# Patient Record
Sex: Female | Born: 1940 | Race: White | Hispanic: No | Marital: Married | Smoking: Never smoker
Health system: Southern US, Community
[De-identification: ages and names within clinical notes are randomized; demographics above are authoritative.]

## PROBLEM LIST (undated history)

## (undated) DIAGNOSIS — M1990 Unspecified osteoarthritis, unspecified site: Secondary | ICD-10-CM

## (undated) DIAGNOSIS — J40 Bronchitis, not specified as acute or chronic: Secondary | ICD-10-CM

## (undated) DIAGNOSIS — J189 Pneumonia, unspecified organism: Secondary | ICD-10-CM

## (undated) DIAGNOSIS — C3491 Malignant neoplasm of unspecified part of right bronchus or lung: Secondary | ICD-10-CM

## (undated) DIAGNOSIS — Z1231 Encounter for screening mammogram for malignant neoplasm of breast: Secondary | ICD-10-CM

## (undated) DIAGNOSIS — Z20822 Contact with and (suspected) exposure to covid-19: Secondary | ICD-10-CM

## (undated) DIAGNOSIS — J449 Chronic obstructive pulmonary disease, unspecified: Secondary | ICD-10-CM

## (undated) DIAGNOSIS — K118 Other diseases of salivary glands: Secondary | ICD-10-CM

## (undated) DIAGNOSIS — Z01818 Encounter for other preprocedural examination: Secondary | ICD-10-CM

## (undated) DIAGNOSIS — Z1211 Encounter for screening for malignant neoplasm of colon: Secondary | ICD-10-CM

## (undated) DIAGNOSIS — I10 Essential (primary) hypertension: Secondary | ICD-10-CM

## (undated) DIAGNOSIS — R918 Other nonspecific abnormal finding of lung field: Secondary | ICD-10-CM

## (undated) DIAGNOSIS — Z95828 Presence of other vascular implants and grafts: Secondary | ICD-10-CM

## (undated) DIAGNOSIS — R1013 Epigastric pain: Secondary | ICD-10-CM

## (undated) DIAGNOSIS — J320 Chronic maxillary sinusitis: Secondary | ICD-10-CM

## (undated) DIAGNOSIS — T189XXA Foreign body of alimentary tract, part unspecified, initial encounter: Principal | ICD-10-CM

## (undated) DIAGNOSIS — R739 Hyperglycemia, unspecified: Secondary | ICD-10-CM

## (undated) DIAGNOSIS — E782 Mixed hyperlipidemia: Secondary | ICD-10-CM

## (undated) HISTORY — PX: CARPAL TUNNEL RELEASE: SHX101

## (undated) HISTORY — PX: BREAST LUMPECTOMY: SHX2

---

## 2012-08-23 LAB — METABOLIC PANEL, BASIC
Anion gap: 7 mmol/L (ref 7–16)
BUN: 12 MG/DL (ref 8–23)
CO2: 30 MMOL/L (ref 21–32)
Calcium: 9 MG/DL (ref 8.3–10.4)
Chloride: 102 MMOL/L (ref 98–107)
Creatinine: 0.8 MG/DL (ref 0.6–1.0)
GFR est AA: >60 mL/min/{1.73_m2} (ref >60)
GFR est non-AA: >60 mL/min/{1.73_m2} (ref >60)
Glucose: 102 MG/DL — ABNORMAL HIGH (ref 65–100)
Potassium: 4 MMOL/L (ref 3.5–5.1)
Sodium: 139 MMOL/L (ref 136–145)

## 2012-08-23 NOTE — Other (Signed)
Verified with patient: Patient's name, name of surgeon, procedure to be performed and date of procedure. All correct in system.    Pt declined offer to see anesthesia today.  Pt aware will meet MDA in preop DOS.    Labs done per anesthesia protocols / surgeon orders: BMP and EKG done today per surgeon's orders. EKG within anesthesia protocols; BMP result pending. No other labs needed per anesthesia protocols.     TYPE -  1B    surgery.    Patient given opportunity to ask questions related to instructions given during pre- assessment.  All questions answered. Informed pt to refer to printed materials provided and to call with any questions between now and the DOS. Phone # printed on materials given to patient.

## 2012-08-23 NOTE — Other (Signed)
EKG and BMP results within anesthesia guidelines. Faxed to surgeon's office for review. Chart filed.     Recent Results (from the past 12 hour(s))   METABOLIC PANEL, BASIC    Collection Time     08/23/12 12:37 PM       Result Value Range    Sodium 139  136 - 145 MMOL/L    Potassium 4.0  3.5 - 5.1 MMOL/L    Chloride 102  98 - 107 MMOL/L    CO2 30  21 - 32 MMOL/L    Anion gap 7  7 - 16 mmol/L    Glucose 102 (*) 65 - 100 MG/DL    BUN 12  8 - 23 MG/DL    Creatinine 5.40  0.6 - 1.0 MG/DL    GFR est AA >98  >11 ml/min/1.57m2    GFR est non-AA >60  >60 ml/min/1.92m2    Calcium 9.0  8.3 - 10.4 MG/DL

## 2012-08-24 LAB — EKG, 12 LEAD, INITIAL
Atrial Rate: 55 {beats}/min
Calculated P Axis: 75 degrees
Calculated R Axis: 38 degrees
Calculated T Axis: 73 degrees
P-R Interval: 1154 ms
Q-T Interval: 450 ms
QRS Duration: 92 ms

## 2012-08-24 NOTE — Progress Notes (Signed)
Spiritual Care review for pre-surgery/procedure consult.  David R. Gillespie, M.Div  Chaplain

## 2012-08-25 ENCOUNTER — Inpatient Hospital Stay: Payer: MEDICARE

## 2012-08-25 MED ADMIN — lactated ringers infusion: INTRAVENOUS | @ 16:00:00 | NDC 00409795309

## 2012-08-25 MED ADMIN — lidocaine (XYLOCAINE) 10 mg/mL (1 %) injection 0.1 mL: SUBCUTANEOUS | @ 16:00:00 | NDC 00409427601

## 2012-08-25 MED ADMIN — cefTRIAXone (ROCEPHIN) 1 g in 0.9% sodium chloride (MBP/ADV) 50 mL MBP: INTRAVENOUS | @ 18:00:00 | NDC 64679098302

## 2012-08-25 MED ADMIN — rocuronium (ZEMURON) injection: INTRAVENOUS | @ 18:00:00 | NDC 67457022810

## 2012-08-25 MED ADMIN — famotidine (PEPCID) tablet 20 mg: ORAL | @ 16:00:00 | NDC 68084017211

## 2012-08-25 MED ADMIN — ePHEDrine (MISTOLE) 50 mg/mL injection: INTRAVENOUS | @ 18:00:00 | NDC 11098051500

## 2012-08-25 MED ADMIN — oxymetazoline (AFRIN) 0.05 % nasal spray: NASAL | @ 18:00:00 | NDC 00904571135

## 2012-08-25 MED ADMIN — ondansetron (ZOFRAN) injection: INTRAVENOUS | @ 18:00:00 | NDC 00781301072

## 2012-08-25 MED ADMIN — propofol (DIPRIVAN) 10 mg/mL injection: INTRAVENOUS | @ 18:00:00 | NDC 00703285604

## 2012-08-25 MED ADMIN — lactated ringers infusion: INTRAVENOUS | @ 18:00:00 | NDC 00409795309

## 2012-08-25 MED ADMIN — lidocaine (PF) (XYLOCAINE) 20 mg/mL (2 %) injection: INTRAVENOUS | @ 18:00:00 | NDC 00409428202

## 2012-08-25 MED ADMIN — succinylcholine (ANECTINE) injection: INTRAVENOUS | @ 18:00:00 | NDC 00781300995

## 2012-08-25 MED ADMIN — fentaNYL citrate (PF) injection: INTRAVENOUS | @ 18:00:00 | NDC 10019003867

## 2012-08-25 MED ADMIN — lidocaine-EPINEPHrine (XYLOCAINE) 2 %-1:100,000 injection: SUBCUTANEOUS | @ 18:00:00 | NDC 00409318201

## 2012-08-25 NOTE — Op Note (Signed)
[facility]                           [address]                            [place], [place] [address]                                [phone]                                OPERATIVE REPORT  ________________________________________________________________________  NAME:  Audrey [name], Audrey [name]                            MR:  [id number]  LOC:  PACUPACUPL            SEX:  F               ACCT:  [id number]  DOB:  07/31/1940            AGE:  72              PT:  S  ADMIT:  08/25/2012          DSCH:                 MSV:  ________________________________________________________________________        DATE OF SURGERY: 08/25/2012    PREPROCEDURE DIAGNOSIS: Right maxillary sinusitis, chronic.    POSTPROCEDURE DIAGNOSIS: Right maxillary sinusitis, chronic.    NAME OF PROCEDURE: Right maxillary antrostomy with evacuation of  contents.    SURGEON: Gardiner Barefoot, MD    ANESTHESIA: General.    FINDINGS: Thick mucoid debris obstructing the right maxillary sinus. No  evidence of any other polyps.    BLOOD LOSS: Probably 15 mL.    PROCEDURE: With the patient under general endotracheal anesthesia, the  head and neck area were draped out. The external nose, septal areas and  the right inferior turbinate were all infiltrated with 2% Xylocaine with  epinephrine 1:100,000. The nose was also packed with Afrin-soaked  pledgets for decongestant action. Following suitable decongestion, an  inferior meatal puncture was made and slightly enlarged. Suction with a  collecting bottle was applied to the maxillary sinus and yielded a lot of  thick mucus and some bloody debris which was sent for culture. Repeated  irrigation cleared the sinus and the procedure was terminated. No packing  was placed in the nose.    The patient is to continue with antibiotic coverage and analgesic  medication. A followup visit has been scheduled for 1 week. The prognosis  seems good.                Gardiner Barefoot,  MD                This is an unverified document unless signed by physician.    TID:  [id number]                                      DT:  08/25/2012 01:44 P  JOB:  [id number]  DOC#:  [id number]           DD:  08/25/2012    cc:   Gardiner Barefoot, MD

## 2012-08-25 NOTE — Anesthesia Post-Procedure Evaluation (Signed)
Post-Anesthesia Evaluation and Assessment    Patient: Audrey [name] MRN: [id number]  SSN: [id number]    Date of Birth: [birth year]  Age: 72 y.o.  Sex: female       Cardiovascular Function/Vital Signs  Visit Vitals   Item Reading   ??? BP 150/67   ??? Pulse 77   ??? Temp 36.7 ??C (98.1 ??F)   ??? Resp 16   ??? SpO2 94%       Patient is status post General anesthesia for Procedure(s):  MAXILARY ANTROSTOMY RIGHT .    Nausea/Vomiting: None    Postoperative hydration reviewed and adequate.    Pain:  Pain Scale 1: Numeric (0 - 10) (08/25/12 1409)  Pain Intensity 1: 0 (08/25/12 1409)   Managed    Neurological Status:   Neuro (WDL): Exceptions to WDL (08/25/12 1409)  Neuro  Neurologic State: Drowsy (08/25/12 1409)   At baseline    Mental Status and Level of Consciousness: Alert and oriented     Pulmonary Status:   O2 Device: Room air (08/25/12 1409)   Adequate oxygenation and airway patent    Complications related to anesthesia: None    Post-anesthesia assessment completed. No concerns    Signed By: Derry Lory, MD     August 25, 2012

## 2012-08-25 NOTE — Progress Notes (Signed)
Pre-surgery consult. Patient already in surgery. Offered silent prayer for patient and medical team.  David R. Gillespie, M.Div  Chaplain  [facility]

## 2012-08-25 NOTE — Brief Op Note (Signed)
BRIEF OPERATIVE NOTE    Date of Procedure: 08/25/2012   Preoperative Diagnosis: SINUSITIS  Postoperative Diagnosis: SINUSITIS    Procedure(s):  MAXILARY ANTROSTOMY RIGHT   Surgeon(s) and Role:     * Gardiner Barefoot., MD - Primary  Anesthesia: General   Estimated Blood Loss: 15 ml    Specimens: Culture of right maxillary contents  Findings: Mucus and debris right maxillary sinus.     Complications: 0      Implants: * No implants in log *

## 2012-08-25 NOTE — Op Note (Signed)
[facility]                           [address]                            [place] [address]                                [phone]                                OPERATIVE REPORT  ________________________________________________________________________  NAME:  [name], Audrey J                            MR:  [id number]  LOC:  PACUPACUPL            SEX:  F               ACCT:  [id number]  DOB:  08/05/1940            AGE:  72              PT:  S  ADMIT:  08/25/2012          DSCH:                 MSV:  ________________________________________________________________________        DATE OF SURGERY: 08/25/2012    PREPROCEDURE DIAGNOSIS: Right maxillary sinusitis, chronic.    POSTPROCEDURE DIAGNOSIS: Right maxillary sinusitis, chronic.    NAME OF PROCEDURE: Right maxillary antrostomy with evacuation of  contents.    SURGEON: Jaretzi Droz G. Mahon Jr, MD    ANESTHESIA: General.    FINDINGS: Thick mucoid debris obstructing the right maxillary sinus. No  evidence of any other polyps.    BLOOD LOSS: Probably 15 mL.    PROCEDURE: With the patient under general endotracheal anesthesia, the  head and neck area were draped out. The external nose, septal areas and  the right inferior turbinate were all infiltrated with 2% Xylocaine with  epinephrine 1:100,000. The nose was also packed with Afrin-soaked  pledgets for decongestant action. Following suitable decongestion, an  inferior meatal puncture was made and slightly enlarged. Suction with a  collecting bottle was applied to the maxillary sinus and yielded a lot of  thick mucus and some bloody debris which was sent for culture. Repeated  irrigation cleared the sinus and the procedure was terminated. No packing  was placed in the nose.    The patient is to continue with antibiotic coverage and analgesic  medication. A followup visit has been scheduled for 1 week. The prognosis  seems good.                Jencarlo Bonadonna G Mahon, Jr,  MD                This is an unverified document unless signed by physician.    TID:  [id number]                                      DT:  08/25/2012 01:44 P  JOB:  [id number]             DOC#:  [id number]           DD:  08/25/2012    cc:   Antiono Ettinger G Mahon, Jr, MD

## 2012-08-25 NOTE — Anesthesia Pre-Procedure Evaluation (Addendum)
Anesthetic History              Review of Systems / Medical History  Patient summary reviewed and pertinent labs reviewed    Pulmonary          Smoker       Neuro/Psych              Cardiovascular                Exercise tolerance: >4 METS     GI/Hepatic/Renal                  Endo/Other        Arthritis     Other Findings            Physical Exam    Airway  Mallampati: II  TM Distance: 4 - 6 cm  Neck ROM: normal range of motion   Mouth opening: Normal     Cardiovascular  Regular rate and rhythm,  S1 and S2 normal,  no murmur, click, rub, or gallop             Dental         Pulmonary  Breath sounds clear to auscultation               Abdominal         Other Findings            Anesthetic Plan    ASA: 2  Anesthesia type: general          Induction: Intravenous  Anesthetic plan and risks discussed with: Patient and Family

## 2012-08-28 LAB — CULTURE SINUS

## 2012-08-30 LAB — CULTURE, ANAEROBIC: Culture result:: NO GROWTH

## 2013-07-14 ENCOUNTER — Encounter

## 2013-07-19 ENCOUNTER — Encounter

## 2013-07-19 MED ORDER — SODIUM BICARBONATE 4 % IV
4 % | Freq: Once | INTRAVENOUS | Status: AC
Start: 2013-07-19 — End: 2013-07-19
  Administered 2013-07-19: 20:00:00 via SUBCUTANEOUS

## 2013-07-19 MED ORDER — LIDOCAINE HCL 2 % (20 MG/ML) IJ SOLN
20 mg/mL (2 %) | Freq: Once | INTRAMUSCULAR | Status: AC
Start: 2013-07-19 — End: 2013-07-19
  Administered 2013-07-19: 20:00:00 via SUBCUTANEOUS

## 2013-07-19 MED FILL — NEUT 4 % INTRAVENOUS SOLUTION: 4 % | INTRAVENOUS | Qty: 1

## 2013-07-19 MED FILL — XYLOCAINE 20 MG/ML (2 %) INJECTION SOLUTION: 20 mg/mL (2 %) | INTRAMUSCULAR | Qty: 20

## 2013-07-19 NOTE — Procedures (Signed)
Interventional Radiology Brief Procedure Note    Patient: Audrey Snow MRN: [id number]  SSN: [id number]    Date of Birth: 1940/10/23  Age: 73 y.o.  Sex: female      Date of Procedure: 07/19/2013    Procedure(s): L Parotid Gland mass FNA biopsy    Performed By: Vladimir Creeks, MD     Anesthesia: None    Estimated Blood Loss: None    Specimens: Cytopathology    Findings: Ovoid, hypoechoic mass in the left parotid gland.     Complications: None    Implants: None    Plan: Discharge home.       Follow Up: Dr Mickle Mallory    Signed By: Vladimir Creeks, MD     July 19, 2013

## 2013-07-19 NOTE — Progress Notes (Signed)
Patient tolerated procedure well, discharge instructions given, assisted patient to front exit where family awaits.

## 2013-07-19 NOTE — Procedures (Signed)
Interventional Radiology Brief Procedure Note    Patient: Audrey Snow MRN: [id number]  SSN: [id number]    Date of Birth: June 27, 1941  Age: 73 y.o.  Sex: female      Date of Procedure: 07/19/2013    Procedure(s): L Parotid Gland mass FNA biopsy    Performed By: Vladimir Creeks, MD     Anesthesia: None    Estimated Blood Loss: None    Specimens: Cytopathology    Findings: Ovoid, hypoechoic mass in the left parotid gland.     Complications: None    Implants: None    Plan: Discharge home.       Follow Up: Dr Mickle Mallory    Signed By: Vladimir Creeks, MD     July 19, 2013

## 2014-01-30 MED ORDER — MUPIROCIN 2 % OINTMENT
2 % | CUTANEOUS | Status: DC
Start: 2014-01-30 — End: 2014-10-05

## 2014-01-30 MED ORDER — BACITRACIN-POLYMYXIN-NEOSP HC 1 % OINTMENT
1 % | CUTANEOUS | Status: DC
Start: 2014-01-30 — End: 2016-02-12

## 2014-01-30 MED ORDER — CYCLOBENZAPRINE 10 MG TAB
10 mg | ORAL_TABLET | ORAL | Status: DC
Start: 2014-01-30 — End: 2016-02-12

## 2014-01-30 MED ORDER — NEOMYCIN-POLYMYXIN-HC 3.5 MG-10,000 UNIT/ML-1 % EAR DROPS, SUSP
3.5-10000-1 mg/mL-unit/mL-% | Freq: Three times a day (TID) | OTIC | Status: DC
Start: 2014-01-30 — End: 2014-11-22

## 2014-01-30 NOTE — Progress Notes (Signed)
HISTORY OF PRESENT ILLNESS  Audrey Snow is a 73 y.o. female.  HPI  Presents for HM issue update- needs RF on meds, labs and Mammogram updated  Still seeing Dr. Debbra Riding on regular basis for her RA   Review of Systems   Constitutional:        Weight gain   HENT: Positive for ear discharge and hearing loss.    Gastrointestinal: Positive for heartburn.       Physical Exam   Constitutional: She is oriented to person, place, and time. She appears well-developed and well-nourished. No distress.   HENT:   Head: Normocephalic and atraumatic.   Right Ear: External ear normal.   Left Ear: External ear normal.   Mouth/Throat: No oropharyngeal exudate.   Right TM  Has perforation from previous tube placement and moist drainage noted in canal  Left TM negative  Pt very HOH   Eyes: Conjunctivae and EOM are normal. Pupils are equal, round, and reactive to light.   Neck: Normal range of motion. Neck supple. Carotid bruit is not present. No thyromegaly present.   Cardiovascular: Normal rate, regular rhythm, normal heart sounds and intact distal pulses.    Pulmonary/Chest: Effort normal and breath sounds normal. Right breast exhibits no inverted nipple, no mass and no tenderness. Left breast exhibits no inverted nipple, no mass and no tenderness. Breasts are symmetrical.   Abdominal: Soft. Bowel sounds are normal. She exhibits no mass. There is no tenderness.   Genitourinary: Vagina normal.   No adnexal masses  No adenopathy in groin   Musculoskeletal: Normal range of motion. She exhibits no edema.   Lymphadenopathy:     She has no cervical adenopathy.   Neurological: She is alert and oriented to person, place, and time. She has normal reflexes.   Skin: Skin is warm and dry.   Psychiatric: She has a normal mood and affect. Her behavior is normal. Judgment and thought content normal.       ASSESSMENT and PLAN    ICD-9-CM    1. Rheumatoid arthritis(714.0) 714.0 CBC W/O DIFF      METABOLIC PANEL, COMPREHENSIVE- update labs on present meds and continue to follow with Dr. Debbra Riding   2. Unspecified hypothyroidism 244.9 TSH, 3RD GENERATION- update labs on Levothyroxine   3. Unspecified vitamin D deficiency 268.9 Monitor   4. Osteoporosis, unspecified 733.00    5. 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 Gastroesophageal reflux disease without esophagitis 530.81 Continue Omeprazole daily   7. Otitis externa in other diseases classified elsewhere, right ear 380.13 RX for ear gtts   8. Visit for screening mammogram V76.12 MAM MAMMO BI SCREENING DIGTL   HOH- needs to go and get her hearing aides and see ENT in near future  HTN- BP stable on Losartan

## 2014-01-31 LAB — CBC W/O DIFF
HCT: 41.9 % (ref 34.0–46.6)
HGB: 14.2 g/dL (ref 11.1–15.9)
MCH: 32.1 pg (ref 26.6–33.0)
MCHC: 33.9 g/dL (ref 31.5–35.7)
MCV: 95 fL (ref 79–97)
PLATELET: 245 10*3/uL (ref 150–379)
RBC: 4.43 x10E6/uL (ref 3.77–5.28)
RDW: 13.7 % (ref 12.3–15.4)
WBC: 5.4 10*3/uL (ref 3.4–10.8)

## 2014-01-31 LAB — METABOLIC PANEL, COMPREHENSIVE
A-G Ratio: 1.8 (ref 1.1–2.5)
ALT (SGPT): 21 IU/L (ref 0–32)
AST (SGOT): 19 IU/L (ref 0–40)
Albumin: 4.4 g/dL (ref 3.5–4.8)
Alk. phosphatase: 42 IU/L (ref 39–117)
BUN/Creatinine ratio: 18 (ref 11–26)
BUN: 13 mg/dL (ref 8–27)
Bilirubin, total: 0.8 mg/dL (ref 0.0–1.2)
CO2: 25 mmol/L (ref 18–29)
Calcium: 9.8 mg/dL (ref 8.7–10.3)
Chloride: 99 mmol/L (ref 97–108)
Creatinine: 0.73 mg/dL (ref 0.57–1.00)
GFR est AA: 94 mL/min/{1.73_m2} (ref >59)
GFR est non-AA: 81 mL/min/{1.73_m2} (ref >59)
GLOBULIN, TOTAL: 2.5 g/dL (ref 1.5–4.5)
Glucose: 108 mg/dL — ABNORMAL HIGH (ref 65–99)
Potassium: 4.3 mmol/L (ref 3.5–5.2)
Protein, total: 6.9 g/dL (ref 6.0–8.5)
Sodium: 140 mmol/L (ref 134–144)

## 2014-01-31 LAB — TSH 3RD GENERATION: TSH: 0.082 u[IU]/mL — ABNORMAL LOW (ref 0.450–4.500)

## 2014-01-31 NOTE — Progress Notes (Signed)
Quick Note:        Labs are stable. Blood sugar is mildly elevated so watch sweets in diet and her thyroid dose is Ok- cannot increase the dose so    Watch her diet    ______

## 2014-02-01 NOTE — Progress Notes (Signed)
Quick Note:        Pt notified of lab results.    ______

## 2014-02-01 NOTE — Progress Notes (Signed)
Quick Note:        Unable to reach pt Left message for pt to call back    ______

## 2014-02-14 NOTE — Progress Notes (Signed)
Quick Note:        Mammogram negative- GC sent    ______

## 2014-03-28 MED ORDER — LEVOTHYROXINE 125 MCG TAB
125 mcg | ORAL_TABLET | ORAL | Status: DC
Start: 2014-03-28 — End: 2015-03-30

## 2014-05-31 MED ORDER — FLUTICASONE 50 MCG/ACTUATION NASAL SPRAY, SUSP
50 mcg/actuation | Freq: Every day | NASAL | Status: DC
Start: 2014-05-31 — End: 2014-10-05

## 2014-10-05 ENCOUNTER — Ambulatory Visit: Admit: 2014-10-05 | Discharge: 2014-10-05 | Payer: MEDICARE | Attending: Internal Medicine | Primary: Internal Medicine

## 2014-10-05 DIAGNOSIS — M069 Rheumatoid arthritis, unspecified: Secondary | ICD-10-CM

## 2014-10-05 LAB — AMB POC URINALYSIS DIP STICK AUTO W/O MICRO
Glucose (UA POC): NEGATIVE
Leukocyte esterase (UA POC): NEGATIVE
Nitrites (UA POC): NEGATIVE

## 2014-10-05 MED ORDER — FLUTICASONE 50 MCG/ACTUATION NASAL SPRAY, SUSP
50 mcg/actuation | Freq: Every day | NASAL | Status: DC
Start: 2014-10-05 — End: 2016-02-12

## 2014-10-05 NOTE — Progress Notes (Signed)
HISTORY OF PRESENT ILLNESS  Audrey Snow is a 74 y.o. female.  HPI  Has been seeing Dr. Debbra Riding regularly but has not been here in our office for several mos  Today having multiple complaints to include HA, sinus and head congestion   Has been having back pain and midepigastric pain and not feeling well    Patient Active Problem List   Diagnosis Code   ??? Rheumatoid arthritis ([place]) M06.9   ??? Hypothyroidism E03.9   ??? Vitamin D deficiency E55.9   ??? Osteoporosis, unspecified M81.0   ??? Chronic airway obstruction, not elsewhere classified ([place]) J44.9   ??? Gastroesophageal reflux disease without esophagitis K21.9   ??? HTN (hypertension), benign I10   ??? Environmental allergies Z91.09     Past Medical History   Diagnosis Date   ??? Hearing loss of both ears    ??? Rheumatoid arthritis(714.0) ([place])      Dr Debbra Riding follows, on methotrexate for control   ??? Osteoarthritis    ??? Sinusitis    ??? Hypothyroid      on med for control   ??? Vitamin D deficiency    ??? Osteoporosis    ??? Chronic obstructive pulmonary disease ([place])    ??? GERD (gastroesophageal reflux disease)    ??? Rheumatoid arthritis(714.0) ([place]) 01/30/2014   ??? Unspecified hypothyroidism 01/30/2014   ??? HTN (hypertension), benign 01/30/2014     No Known Allergies  Current Outpatient Prescriptions   Medication Sig Dispense Refill   ??? fluticasone (FLONASE) 50 mcg/actuation nasal spray 2 Sprays by Both Nostrils route daily. 1 Bottle 5   ??? levothyroxine (SYNTHROID) 125 mcg tablet Take one tablet by mouth everyday. 30 Tab 11   ??? Cholecalciferol, Vitamin D3, 50,000 unit cap Take 1 Cap by mouth every Monday.     ??? methotrexate, PF, 25 mg/mL injection 25 mg every seven (7) days.     ??? losartan (COZAAR) 50 mg tablet Take  by mouth daily.     ??? nicotinic acid (NIACIN) 500 mg tablet Take 500 mg by mouth Daily (before breakfast). 2 po everyday.     ??? calcium-cholecalciferol, d3, (CALCIUM 600 + D) 600-125 mg-unit tab Take 2 Tabs by mouth.      ??? multivitamin (ONE A DAY) tablet Take 1 Tab by mouth daily.     ??? neomycin-polymyxin-hydrocortisone, buffered, (PEDIOTIC) 3.5-10,000-1 mg/mL-unit/mL-% otic suspension Administer 3 Drops in right ear three (3) times daily. 10 mL 0   ??? cyclobenzaprine (FLEXERIL) 10 mg tablet Take one tablet every evening when needed. 30 Tab 5   ??? bacitracin-neomycin-polymyxin b-hydrocortisone (CORTISPORIN) 1 % ointment Use samll amount 3-4 times a day. 15 g 5   ??? folic acid (FOLVITE) 1 mg tablet Take  by mouth daily.         Review of Systems   Constitutional: Positive for malaise/fatigue.   HENT: Positive for congestion, hearing loss and sore throat.    Respiratory: Positive for cough and shortness of breath.    Cardiovascular: Negative for chest pain.   Musculoskeletal: Positive for joint pain.   All other systems reviewed and are negative.    BP 150/70 mmHg   Pulse 67   Ht 5\' 7"  (1.702 m)   Wt 169 lb 3.2 oz (76.749 kg)   BMI 26.49 kg/m2   SpO2 94%    Physical Exam   Constitutional: She is oriented to person, place, and time. She appears well-developed and well-nourished. No distress.   HENT:   Head: Normocephalic  and atraumatic.   Right Ear: Decreased hearing is noted.   Left Ear: Decreased hearing is noted.   Cardiovascular: Normal rate, regular rhythm and normal heart sounds.    Pulmonary/Chest: Effort normal.   Rhonchi bilateral   Abdominal: Soft. Bowel sounds are normal. There is tenderness.   midepigastric mild tenderness   Neurological: She is alert and oriented to person, place, and time.   Skin: Skin is dry.   Psychiatric: She has a normal mood and affect. Her behavior is normal. Judgment and thought content normal.       ASSESSMENT and PLAN    ICD-10-CM ICD-9-CM    1. Rheumatoid arthritis, involving unspecified site, unspecified rheumatoid factor presence ([facility]) M06.9 714.0 CBC W/O DIFF      METABOLIC PANEL, COMPREHENSIVE- follow with Dr. Debbra Riding   2. Acquired hypothyroidism E03.9 244.9 TSH, 3RD GENERATION- need to update  labs on present med dose   3. Vitamin D deficiency E55.9 268.9 Requires supplements   4. Gastroesophageal reflux disease without esophagitis K21.9 530.81    5. HTN (hypertension), benign I10 401.1 CBC W/O DIFF      METABOLIC PANEL, COMPREHENSIVE- update renal function- on ARB   6. Environmental allergies Z91.09 V15.09 Continue Flonase   7. Low back pain without sciatica, unspecified back pain laterality M54.5 724.2 Chronic    8. Urinary frequency R35.0 788.41 AMB POC URINALYSIS DIP STICK AUTO W/O MICRO   9. SOB (shortness of breath) R06.02 786.05 XR CHEST PA LAT- update with history of tobacco use   10. Elevated blood sugar R73.9 790.29 HEMOGLOBIN A1C- update   11. Midepigastric pain R10.13 789.06 XR UPPER GI SERIES W KUB- will check xrays for GERD or gastritis

## 2014-10-05 NOTE — Progress Notes (Signed)
Quick Note:        negative    ______

## 2014-10-06 LAB — METABOLIC PANEL, COMPREHENSIVE
A-G Ratio: 1.8 (ref 1.1–2.5)
ALT (SGPT): 16 IU/L (ref 0–32)
AST (SGOT): 14 IU/L (ref 0–40)
Albumin: 4.4 g/dL (ref 3.5–4.8)
Alk. phosphatase: 52 IU/L (ref 39–117)
BUN/Creatinine ratio: 19 (ref 11–26)
BUN: 14 mg/dL (ref 8–27)
Bilirubin, total: 0.5 mg/dL (ref 0.0–1.2)
CO2: 28 mmol/L (ref 18–29)
Calcium: 9.6 mg/dL (ref 8.7–10.3)
Chloride: 101 mmol/L (ref 97–108)
Creatinine: 0.73 mg/dL (ref 0.57–1.00)
GFR est AA: 93 mL/min/{1.73_m2} (ref >59)
GFR est non-AA: 81 mL/min/{1.73_m2} (ref >59)
GLOBULIN, TOTAL: 2.5 g/dL (ref 1.5–4.5)
Glucose: 91 mg/dL (ref 65–99)
Potassium: 4.3 mmol/L (ref 3.5–5.2)
Protein, total: 6.9 g/dL (ref 6.0–8.5)
Sodium: 141 mmol/L (ref 134–144)

## 2014-10-06 LAB — CBC W/O DIFF
HCT: 43.8 % (ref 34.0–46.6)
HGB: 14.4 g/dL (ref 11.1–15.9)
MCH: 32 pg (ref 26.6–33.0)
MCHC: 32.9 g/dL (ref 31.5–35.7)
MCV: 97 fL (ref 79–97)
PLATELET: 232 10*3/uL (ref 150–379)
RBC: 4.5 x10E6/uL (ref 3.77–5.28)
RDW: 13.6 % (ref 12.3–15.4)
WBC: 7.7 10*3/uL (ref 3.4–10.8)

## 2014-10-06 LAB — HEMOGLOBIN A1C WITH EAG: Hemoglobin A1c: 6.3 % — ABNORMAL HIGH (ref 4.8–5.6)

## 2014-10-06 LAB — TSH 3RD GENERATION: TSH: 0.094 u[IU]/mL — ABNORMAL LOW (ref 0.450–4.500)

## 2014-10-06 NOTE — Progress Notes (Signed)
Quick Note:        Labs are stable    No change in thyroid medication- needs to watch her sugar intake as Blood sugar mildly elevated    Copy of labs to Dr. Debbra Riding and to pt    ______

## 2014-10-09 NOTE — Progress Notes (Signed)
Quick Note:        Faxed to Dr Debbra Riding at [phone].    ______

## 2014-10-18 ENCOUNTER — Inpatient Hospital Stay: Admit: 2014-10-18 | Payer: MEDICARE | Attending: Internal Medicine | Primary: Internal Medicine

## 2014-10-18 ENCOUNTER — Encounter

## 2014-10-18 DIAGNOSIS — R1013 Epigastric pain: Secondary | ICD-10-CM

## 2014-10-18 MED ORDER — SOD BICARB-CITRIC AC-SIMETH 2.21 GRAM-1.53 GRAM/4 GRAM ORAL GRAN IN PK
Freq: Once | ORAL | Status: AC
Start: 2014-10-18 — End: 2014-10-18
  Administered 2014-10-18: 14:00:00 via ORAL

## 2014-10-18 MED ORDER — BARIUM SULFATE 98 % ORAL SUSP, RECON
98 % | Freq: Once | ORAL | Status: AC
Start: 2014-10-18 — End: 2014-10-18
  Administered 2014-10-18: 14:00:00 via ORAL

## 2014-10-18 MED ORDER — BARIUM SULFATE 60 % (W/V) ORAL SUSP
60 % (w/v) | Freq: Once | ORAL | Status: AC
Start: 2014-10-18 — End: 2014-10-18
  Administered 2014-10-18: 15:00:00 via ORAL

## 2014-10-18 NOTE — Progress Notes (Signed)
Quick Note:        Has a HH and reflux that is spontaneous and was noted on exam    No ulcer    Will need to be on daily medication    ______

## 2014-10-18 NOTE — Progress Notes (Signed)
Quick Note:        CXR ok    ______

## 2014-10-18 NOTE — Progress Notes (Signed)
Quick Note:        Start daily Zantac 150mg  and send in RX    ______

## 2014-10-19 MED ORDER — RANITIDINE 150 MG TAB
150 mg | ORAL_TABLET | Freq: Every day | ORAL | Status: DC
Start: 2014-10-19 — End: 2016-02-12

## 2014-10-19 NOTE — Progress Notes (Signed)
Quick Note:        Pt notified of CXR results.    ______

## 2014-10-19 NOTE — Progress Notes (Signed)
Quick Note:        Unable to reach pt Left message for pt to call back.    ______

## 2014-10-19 NOTE — Progress Notes (Signed)
Quick Note:        Pt notified of UGI and barium swallow results. Rx for Zantac 150mg  sent to [facility]. [phone])    ______

## 2014-11-22 ENCOUNTER — Ambulatory Visit: Admit: 2014-11-22 | Discharge: 2014-11-22 | Payer: MEDICARE | Attending: Family | Primary: Internal Medicine

## 2014-11-22 DIAGNOSIS — J329 Chronic sinusitis, unspecified: Secondary | ICD-10-CM

## 2014-11-22 MED ORDER — CEFUROXIME AXETIL 250 MG TAB
250 mg | ORAL_TABLET | Freq: Two times a day (BID) | ORAL | Status: DC
Start: 2014-11-22 — End: 2016-02-12

## 2014-11-22 NOTE — Progress Notes (Signed)
Willaim Bane presents today c/o dizziness.  She has been seeing Dr. Mickle Mallory (ENT) - was treated for a right OM with antibiotic x 14 days.  About 10 days later, she started getting head/sinus pressure.  She is tired, eyes congested, vertigo and all symptoms worse with position changes.  Lastly, she has had mid-epigastric pain and was recently diagnosed with a hiatal hernia and GERD; she has started on Zantac daily.    Chief Complaint   Patient presents with   ??? Dizziness     Patient Active Problem List   Diagnosis Code   ??? Rheumatoid arthritis ([place]) M06.9   ??? Hypothyroidism E03.9   ??? Vitamin D deficiency E55.9   ??? Osteoporosis, unspecified M81.0   ??? Chronic airway obstruction, not elsewhere classified ([place]) J44.9   ??? Gastroesophageal reflux disease without esophagitis K21.9   ??? HTN (hypertension), benign I10   ??? Environmental allergies Z91.09     Past Medical History   Diagnosis Date   ??? Hearing loss of both ears    ??? Rheumatoid arthritis(714.0) ([place])      Dr Debbra Riding follows, on methotrexate for control   ??? Osteoarthritis    ??? Sinusitis    ??? Hypothyroid      on med for control   ??? Vitamin D deficiency    ??? Osteoporosis    ??? Chronic obstructive pulmonary disease ([place])    ??? GERD (gastroesophageal reflux disease)    ??? Rheumatoid arthritis(714.0) ([place]) 01/30/2014   ??? Unspecified hypothyroidism 01/30/2014   ??? HTN (hypertension), benign 01/30/2014     Past Surgical History   Procedure Laterality Date   ??? Hx carpal tunnel release Bilateral    ??? Hx hemorrhoidectomy     ??? 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 Hx colonoscopy       colon polyps   ??? Hx tonsillectomy  74 yrs old   ??? Hx other surgical  09/2012     sinus surgery-Dr. Mickle Mallory   ??? 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 Marital Status: MARRIED     Social History Main Topics   ??? Smoking status: Current Every Day Smoker -- 0.50 packs/day for 43 years   ??? Smokeless tobacco: Never Used       Comment: not regular when first started- 1pk/week   ??? Alcohol Use: No   ??? Drug Use: No     No Known Allergies    Current Outpatient Prescriptions   Medication Sig   ??? ranitidine (ZANTAC) 150 mg tablet Take 1 Tab by mouth daily.   ??? fluticasone (FLONASE) 50 mcg/actuation nasal spray 2 Sprays by Both Nostrils route daily.   ??? levothyroxine (SYNTHROID) 125 mcg tablet Take one tablet by mouth everyday.   ??? Cholecalciferol, Vitamin D3, 50,000 unit cap Take 1 Cap by mouth every Monday.   ??? methotrexate, PF, 25 mg/mL injection 25 mg every seven (7) days.   ??? losartan (COZAAR) 50 mg tablet Take  by mouth daily.   ??? nicotinic acid (NIACIN) 500 mg tablet Take 500 mg by mouth Daily (before breakfast). 2 po everyday.   ??? calcium-cholecalciferol, d3, (CALCIUM 600 + D) 600-125 mg-unit tab Take 2 Tabs by mouth.   ??? multivitamin (ONE A DAY) tablet Take 1 Tab by mouth daily.   ??? cyclobenzaprine (FLEXERIL) 10 mg tablet Take  one tablet every evening when needed.   ??? bacitracin-neomycin-polymyxin b-hydrocortisone (CORTISPORIN) 1 % ointment Use samll amount 3-4 times a day.   ??? folic acid (FOLVITE) 1 mg tablet Take  by mouth daily.     Review of Systems - Negative except as stated in HPI  History obtained from chart review and the patient  General ROS: negative for - fatigue or fever  Ophthalmic ROS: positive for - uses contacts and right eye crusted  ENT ROS: positive for - nasal congestion, nasal discharge, sinus pain and vertigo  negative for - sore throat  Respiratory ROS: no cough, shortness of breath, or wheezing  Cardiovascular ROS: no chest pain or dyspnea on exertion    BP 130/82 mmHg   Ht 5' 6.5" (1.689 m)   Wt 168 lb 9.6 oz (76.476 kg)   BMI 26.81 kg/m2    Physical Examination: General appearance - alert, well appearing, and in no distress, oriented to person, place, and time and normal appearing weight  Mental status - alert, oriented to person, place, and time, normal mood,  behavior, speech, dress, motor activity, and thought processes, affect appropriate to mood  Eyes - pupils equal and reactive, extraocular eye movements intact, right eye normal  Ears - Right TM with well-healed perforation; no drainage. Left TM dull/intact.  Nose - normal and patent, no erythema, discharge or polyps  Mouth - erythematous and exudate noted  Neck - supple, no significant adenopathy, thyroid exam: thyroid is normal in size without nodules or tenderness  Chest - clear to auscultation, no wheezes, rales or rhonchi, symmetric air entry  Heart - normal rate, regular rhythm, normal S1, S2, no murmurs, rubs, clicks or gallops    Assessment/Plan:  1. Chronic sinusitis, unspecified location    2. Gastroesophageal reflux disease with esophagitis    3. Hiatal hernia      Orders Placed This Encounter   ??? REFERRAL TO GASTROENTEROLOGY     Referral Priority:  Routine     Referral Type:  Consultation     Referral Reason:  Specialty Services Required   ??? cefUROXime (CEFTIN) 250 mg tablet     Sig: Take 1 Tab by mouth two (2) times a day.     Dispense:  20 Tab     Refill:  0     Ceftin as prescribed - directions for use and side effects discussed.  Recommended Mucinex D every morning.  Follow up with ENT as scheduled.  Refer to GI dx: GERD and hiatal hernia.    Follow-up Disposition:  Return if symptoms worsen or fail to improve.    Jeoffrey Massed, NP

## 2015-02-23 ENCOUNTER — Institutional Professional Consult (permissible substitution): Admit: 2015-02-23 | Discharge: 2015-02-23 | Payer: MEDICARE | Primary: Internal Medicine

## 2015-02-23 DIAGNOSIS — R350 Frequency of micturition: Secondary | ICD-10-CM

## 2015-02-23 LAB — AMB POC URINALYSIS DIP STICK AUTO W/O MICRO
Glucose (UA POC): NEGATIVE
Nitrites (UA POC): NEGATIVE

## 2015-02-23 NOTE — Progress Notes (Signed)
Send for C+S

## 2015-02-23 NOTE — Progress Notes (Signed)
Pt came in for a nursing visit for urine check. C/O urinary frequency and pain with urination. Urine dip done in lab, nitrates negative and trace leukocytes. Urine sent for culture. Pt notified I will call her with the results.

## 2015-02-25 LAB — CULTURE, URINE

## 2015-02-26 NOTE — Progress Notes (Signed)
Unable to reach pt, as she is working. Spouse notified and he will give her the results.

## 2015-02-26 NOTE — Progress Notes (Signed)
Urine negative- no infection

## 2015-03-30 MED ORDER — LEVOTHYROXINE 125 MCG TAB
125 mcg | ORAL_TABLET | ORAL | 11 refills | Status: DC
Start: 2015-03-30 — End: 2016-02-13

## 2015-04-02 LAB — HM DEXA SCAN

## 2016-02-12 ENCOUNTER — Ambulatory Visit: Admit: 2016-02-12 | Discharge: 2016-02-12 | Payer: MEDICARE | Attending: Internal Medicine | Primary: Internal Medicine

## 2016-02-12 DIAGNOSIS — I10 Essential (primary) hypertension: Secondary | ICD-10-CM

## 2016-02-12 LAB — AMB POC URINALYSIS DIP STICK AUTO W/O MICRO
Bilirubin (UA POC): NEGATIVE
Blood (UA POC): NEGATIVE
Glucose (UA POC): NEGATIVE
Ketones (UA POC): NEGATIVE
Nitrites (UA POC): NEGATIVE
Protein (UA POC): NEGATIVE mg/dL
Specific gravity (UA POC): 1.02 (ref 1.001–1.035)
Urobilinogen (UA POC): 0.2 (ref 0.2–1)
pH (UA POC): 7 (ref 4.6–8.0)

## 2016-02-12 MED ORDER — CYCLOBENZAPRINE 10 MG TAB
10 mg | ORAL_TABLET | ORAL | 5 refills | Status: DC
Start: 2016-02-12 — End: 2017-03-13

## 2016-02-12 MED ORDER — MUPIROCIN 2 % OINTMENT
2 % | Freq: Every day | CUTANEOUS | 5 refills | Status: DC
Start: 2016-02-12 — End: 2018-01-27

## 2016-02-12 MED ORDER — LOSARTAN 100 MG TAB
100 mg | ORAL_TABLET | Freq: Every day | ORAL | 11 refills | Status: DC
Start: 2016-02-12 — End: 2017-02-19

## 2016-02-12 MED ORDER — FLUTICASONE 50 MCG/ACTUATION NASAL SPRAY, SUSP
50 mcg/actuation | Freq: Every day | NASAL | 5 refills | Status: DC
Start: 2016-02-12 — End: 2017-02-06

## 2016-02-12 MED ORDER — AMOXICILLIN CLAVULANATE 875 MG-125 MG TAB
875-125 mg | ORAL_TABLET | Freq: Two times a day (BID) | ORAL | 0 refills | Status: DC
Start: 2016-02-12 — End: 2016-06-10

## 2016-02-12 MED ORDER — BACITRACIN-POLYMYXIN-NEOSP HC 1 % OINTMENT
1 % | CUTANEOUS | 5 refills | Status: DC
Start: 2016-02-12 — End: 2018-06-25

## 2016-02-12 NOTE — Progress Notes (Signed)
HPI:     Has not been seen in about one year  Has been mainly seeing Dr. Heath Gold and has had multiple tests ordered for her SOB and emphysema  Quit smoking about 4 yrs ago but still with symptoms    Recently with sinus congestion and drainage and cough with congestion  Txed recently for sinus infection with Bactrim DS earlier this month    Also needs refills on meds    Has been having urinary frequency  Problem List:  Patient Active Problem List   Diagnosis Code   ??? Rheumatoid arthritis ([place]) M06.9   ??? Hypothyroidism E03.9   ??? Vitamin D deficiency E55.9   ??? Osteoporosis, unspecified M81.0   ??? Gastroesophageal reflux disease without esophagitis K21.9   ??? HTN (hypertension), benign I10   ??? Environmental allergies Z91.09   ??? 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 Chronic obstructive pulmonary disease ([place])    ??? GERD (gastroesophageal reflux disease)    ??? Hearing loss of both ears    ??? HTN (hypertension), benign 01/30/2014   ??? Hypothyroid     on med for control   ??? Osteoarthritis    ??? Osteoporosis    ??? Rheumatoid arthritis(714.0) ([place])     Dr Debbra Riding follows, on methotrexate for control   ??? Rheumatoid arthritis(714.0) ([place]) 01/30/2014   ??? Sinusitis    ??? Unspecified hypothyroidism 01/30/2014   ??? Vitamin D deficiency        Allergies:  No Known Allergies    Current Medications:  Current Outpatient Prescriptions   Medication Sig Dispense Refill   ??? fluticasone (FLONASE) 50 mcg/actuation nasal spray 2 Sprays by Both Nostrils route daily. 1 Bottle 5   ??? cyclobenzaprine (FLEXERIL) 10 mg tablet Take one tablet every evening when needed. 20 Tab 5   ??? bacitracin-neomycin-polymyxin b-hydrocortisone (CORTISPORIN) 1 % ointment Use samll amount 3-4 times a day. 15 g 5   ??? amoxicillin-clavulanate (AUGMENTIN) 875-125 mg per tablet Take 1 Tab by mouth every twelve (12) hours. Indications: ACUTE BACTERIAL SINUSITIS 20 Tab 0    ??? losartan (COZAAR) 100 mg tablet Take 1 Tab by mouth daily. Indications: hypertension 30 Tab 11   ??? levothyroxine (SYNTHROID) 125 mcg tablet Take one tablet by mouth everyday. 30 Tab 11   ??? Cholecalciferol, Vitamin D3, 50,000 unit cap Take 1 Cap by mouth every Monday.     ??? methotrexate, PF, 25 mg/mL injection 25 mg every seven (7) days.     ??? nicotinic acid (NIACIN) 500 mg tablet Take 500 mg by mouth Daily (before breakfast). 2 po everyday.     ??? calcium-cholecalciferol, d3, (CALCIUM 600 + D) 600-125 mg-unit tab Take 2 Tabs by mouth.     ??? multivitamin (ONE A DAY) tablet Take 1 Tab by mouth daily.     ??? folic acid (FOLVITE) 1 mg tablet Take  by mouth daily.         Review of Systems:  Review of Systems   Constitutional: Negative for weight loss.   HENT: Positive for congestion.    Respiratory: Positive for cough, sputum production and shortness of breath. Negative for wheezing.    Cardiovascular: Positive for palpitations.   Genitourinary: Positive for frequency and urgency.   Musculoskeletal: Positive for back pain.   Endo/Heme/Allergies: Positive for environmental allergies.   All other systems reviewed and are negative.  Vitals:  Visit Vitals   ??? BP 140/80   ??? Pulse 72   ??? Ht '5\' 6"'$  (1.676 m)   ??? Wt 168 lb 3.2 oz (76.3 kg)   ??? SpO2 92%   ??? BMI 27.15 kg/m2       Physical Exam:  Physical Exam   Constitutional: She is oriented to person, place, and time. She appears well-developed and well-nourished. No distress.   HENT:   Head: Normocephalic and atraumatic.   Right Ear: Decreased hearing is noted.   Left Ear: Decreased hearing is noted.   Neck: Normal range of motion. Neck supple.   Cardiovascular: Normal rate, regular rhythm and normal heart sounds.    occas ectopy   Pulmonary/Chest: Effort normal and breath sounds normal. No respiratory distress. She has no wheezes.   Musculoskeletal: Normal range of motion.   Neurological: She is alert and oriented to person, place, and time.   Skin: Skin is warm and dry.    Psychiatric: She has a normal mood and affect. Her behavior is normal. Judgment and thought content normal.       Assessment/Plan:     ICD-10-CM ICD-9-CM    1. HTN (hypertension), benign L87 564.3 METABOLIC PANEL, BASIC      AMB POC EKG ROUTINE W/ 12 LEADS, INTER & REP- PVC noted  Need to restart her Cozaar for BP control   2. Environmental allergies Z91.09 V15.09 Continue meds   3. Acquired hypothyroidism E03.9 244.9 TSH 3RD GENERATION- due for labs   4. Rheumatoid arthritis, involving unspecified site, unspecified rheumatoid factor presence ([place]) M06.9 714.0 Seeing Dr. Debbra Riding   5. Chronic obstructive pulmonary disease, unspecified COPD type ([facility]) J44.9 496 REFERRAL TO PULMONARY DISEASE   6. SOB (shortness of breath) R06.02 786.05 REFERRAL TO PULMONARY DISEASE   7. Bronchitis J40 490 txed with another course of ABX   8. Bilateral hearing loss, unspecified hearing loss type H91.93 389.9 Recommend eval with [facility]   9. Heart palpitations R00.2 785.1 AMB POC EKG ROUTINE W/ 12 LEADS, INTER & REP   10. Elevated blood sugar R73.9 790.29 HEMOGLOBIN A1C WITH EAG   11. Abnormal CT scan, chest R93.8 793.2 REFERRAL TO PULMONARY DISEASE   12. Sinusitis, unspecified chronicity, unspecified location J32.9 473.9 Seeing ENT- Dr. Mickle Mallory   13. Chronic bilateral low back pain without sciatica M54.5 724.2 Needs RF on Flexeril that she takes prn if gets flare up from working at [employer]    G89.29 338.29    14. Pulmonary emphysema, unspecified emphysema type ([place]) J43.9 492.8 REFERRAL TO PULMONARY DISEASE       [name], MD

## 2016-02-12 NOTE — Progress Notes (Signed)
Send for C+S  On Augmentin for URI

## 2016-02-13 LAB — CULTURE, URINE

## 2016-02-13 LAB — METABOLIC PANEL, BASIC
BUN/Creatinine ratio: 18 (ref 12–28)
BUN: 14 mg/dL (ref 8–27)
CO2: 26 mmol/L (ref 18–29)
Calcium: 9.1 mg/dL (ref 8.7–10.3)
Chloride: 99 mmol/L (ref 96–106)
Creatinine: 0.78 mg/dL (ref 0.57–1.00)
GFR est AA: 85 mL/min/{1.73_m2} (ref >59)
GFR est non-AA: 74 mL/min/{1.73_m2} (ref >59)
Glucose: 104 mg/dL — ABNORMAL HIGH (ref 65–99)
Potassium: 4.4 mmol/L (ref 3.5–5.2)
Sodium: 141 mmol/L (ref 134–144)

## 2016-02-13 LAB — HEMOGLOBIN A1C WITH EAG
Estimated average glucose: 131 mg/dL
Hemoglobin A1c: 6.2 % — ABNORMAL HIGH (ref 4.8–5.6)

## 2016-02-13 LAB — TSH 3RD GENERATION: TSH: 0.049 u[IU]/mL — ABNORMAL LOW (ref 0.450–4.500)

## 2016-02-13 MED ORDER — LEVOTHYROXINE 100 MCG TAB
100 mcg | ORAL_TABLET | Freq: Every day | ORAL | 11 refills | Status: DC
Start: 2016-02-13 — End: 2017-02-13

## 2016-02-13 NOTE — Progress Notes (Signed)
On too much thyroid medication  Need to decrease to 173mg daily   Need to watch sweets in diet- is prediabetic  Pt may have copy of labs

## 2016-02-13 NOTE — Progress Notes (Signed)
Urine culture negative

## 2016-02-13 NOTE — Progress Notes (Signed)
Pt notified of lab results. A copy of labs mailed to pt. Rx for Levothyroxine 150mg sent to [facility].

## 2016-02-14 NOTE — Progress Notes (Signed)
Pt notified of urine culture results.

## 2016-06-10 ENCOUNTER — Ambulatory Visit: Admit: 2016-06-10 | Discharge: 2016-06-10 | Payer: MEDICARE | Attending: Internal Medicine | Primary: Internal Medicine

## 2016-06-10 DIAGNOSIS — M069 Rheumatoid arthritis, unspecified: Secondary | ICD-10-CM

## 2016-06-10 MED ORDER — MUPIROCIN 2 % OINTMENT
2 % | Freq: Every day | CUTANEOUS | 1 refills | Status: DC
Start: 2016-06-10 — End: 2017-06-03

## 2016-06-10 NOTE — Progress Notes (Signed)
HPI: had area on right side of scalp and ears and treated by ENT for possible shingles but RX has been completed   Still with follicular dermatitis on right side of scalp - follicles develop and get itching and irritation    Gets itching on outside of right ear and scalp    Still getting SOB with exertion but denies wheezing at this time- had all her pulmonary studies at [facility] so will refer to [facility]    Has not had her updated Mammogram        Problem List:  Patient Active Problem List   Diagnosis Code   ??? Rheumatoid arthritis ([place]) M06.9   ??? Hypothyroidism E03.9   ??? Vitamin D deficiency E55.9   ??? Osteoporosis M81.0   ??? Gastroesophageal reflux disease without esophagitis K21.9   ??? HTN (hypertension), benign I10   ??? Environmental allergies Z91.09   ??? 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 Prediabetes R73.03       History:  Past Medical History:   Diagnosis Date   ??? Chronic obstructive pulmonary disease ([place])    ??? GERD (gastroesophageal reflux disease)    ??? Hearing loss of both ears    ??? HTN (hypertension), benign 01/30/2014   ??? Hypothyroid     on med for control   ??? Osteoarthritis    ??? Osteoporosis    ??? Rheumatoid arthritis(714.0) ([place])     Dr Debbra Riding follows, on methotrexate for control   ??? Rheumatoid arthritis(714.0) ([place]) 01/30/2014   ??? Sinusitis    ??? Unspecified hypothyroidism 01/30/2014   ??? Vitamin D deficiency        Allergies:  No Known Allergies    Current Medications:  Current Outpatient Prescriptions   Medication Sig Dispense Refill   ??? denosumab (PROLIA) 60 mg/mL injection 60 mg by SubCUTAneous route.     ??? mupirocin (BACTROBAN) 2 % ointment Apply  to affected area daily. 15 g 1   ??? levothyroxine (SYNTHROID) 100 mcg tablet Take 1 Tab by mouth Daily (before breakfast). 30 Tab 11   ??? fluticasone (FLONASE) 50 mcg/actuation nasal spray 2 Sprays by Both Nostrils route daily. 1 Bottle 5   ??? cyclobenzaprine (FLEXERIL) 10 mg tablet Take one tablet every evening  when needed. 20 Tab 5   ??? bacitracin-neomycin-polymyxin b-hydrocortisone (CORTISPORIN) 1 % ointment Use samll amount 3-4 times a day. 15 g 5   ??? losartan (COZAAR) 100 mg tablet Take 1 Tab by mouth daily. Indications: hypertension 30 Tab 11   ??? mupirocin (BACTROBAN) 2 % ointment Apply  to affected area daily. 22 g 5   ??? Cholecalciferol, Vitamin D3, 50,000 unit cap Take 1 Cap by mouth every Monday.     ??? methotrexate, PF, 25 mg/mL injection 25 mg every seven (7) days.     ??? nicotinic acid (NIACIN) 500 mg tablet Take 500 mg by mouth Daily (before breakfast). 2 po everyday.     ??? calcium-cholecalciferol, d3, (CALCIUM 600 + D) 600-125 mg-unit tab Take 2 Tabs by mouth.     ??? multivitamin (ONE A DAY) tablet Take 1 Tab by mouth daily.     ??? folic acid (FOLVITE) 1 mg tablet Take  by mouth daily.         Review of Systems:  Review of Systems   HENT: Positive for hearing loss.    Respiratory: Positive for shortness of breath.    Musculoskeletal: Positive for joint pain.   Skin: Positive  for itching and rash.   All other systems reviewed and are negative.      Vitals:  Visit Vitals   ??? BP 132/82   ??? Ht '5\' 6"'$  (1.676 m)   ??? Wt 169 lb (76.7 kg)   ??? BMI 27.28 kg/m2       Physical Exam:  Physical Exam   Constitutional: She is oriented to person, place, and time. She appears well-developed and well-nourished.   HENT:   Head: Normocephalic and atraumatic.   Neck: Normal range of motion. Neck supple.   Cardiovascular: Normal rate, regular rhythm and normal heart sounds.    Pulmonary/Chest: Effort normal and breath sounds normal.   Neurological: She is alert and oriented to person, place, and time.   Skin: Skin is warm and dry.   Dryness in right ear canal  Folliculitis behind right ear and in scalp   Psychiatric: She has a normal mood and affect. Her behavior is normal. Judgment and thought content normal.       Assessment/Plan:   Diagnoses and all orders for this visit:     1. Rheumatoid arthritis, involving unspecified site, unspecified rheumatoid factor presence ([place])  Continue to follow with Dr. Debbra Riding  2. HTN (hypertension), benign  BP stable  3. Acquired hypothyroidism  Continue Synthroid  4. Prediabetes  Watch diet  5. Osteoporosis, unspecified osteoporosis type, unspecified pathological fracture presence    6. SOB (shortness of breath) on exertion  Would benefit from Pulmonary eval    7. Localized dermatitis  Will try topical Bactroban  Other orders  -     mupirocin (BACTROBAN) 2 % ointment; Apply  to affected area daily.        [name], MD

## 2016-06-11 LAB — HEMOGLOBIN A1C WITH EAG
Estimated average glucose: 128 mg/dL
Hemoglobin A1c: 6.1 % — ABNORMAL HIGH (ref 4.8–5.6)

## 2016-06-11 LAB — TSH 3RD GENERATION: TSH: 1.89 u[IU]/mL (ref 0.450–4.500)

## 2016-06-11 NOTE — Progress Notes (Signed)
Thyroid ok  Mild increase in blood sugar- average 128 so watch sweets in diet    Copy to pt

## 2016-10-15 ENCOUNTER — Ambulatory Visit: Admit: 2016-10-15 | Discharge: 2016-10-15 | Payer: MEDICARE | Attending: Family | Primary: Internal Medicine

## 2016-10-15 DIAGNOSIS — H6691 Otitis media, unspecified, right ear: Secondary | ICD-10-CM

## 2016-10-15 MED ORDER — TRIMETHOPRIM-SULFAMETHOXAZOLE 160 MG-800 MG TAB
160-800 mg | ORAL_TABLET | Freq: Every day | ORAL | 0 refills | Status: DC
Start: 2016-10-15 — End: 2016-12-09

## 2016-10-15 NOTE — Progress Notes (Signed)
Audrey Snow presents today c/o headache, right ear pain, rash to right scalp x several days.  She also has had weight gain and fatigue; she is on Synthroid for acquired hypothyroidism.    Chief Complaint   Patient presents with   ??? Headache   ??? Rash     behind right ear   ??? Fatigue   ??? Weight Gain     Patient Active Problem List   Diagnosis Code   ??? Rheumatoid arthritis ([place]) M06.9   ??? Hypothyroidism E03.9   ??? Vitamin D deficiency E55.9   ??? Osteoporosis M81.0   ??? Gastroesophageal reflux disease without esophagitis K21.9   ??? HTN (hypertension), benign I10   ??? Environmental allergies Z91.09   ??? 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 Prediabetes R73.03     Past Medical History:   Diagnosis Date   ??? Chronic obstructive pulmonary disease ([place])    ??? GERD (gastroesophageal reflux disease)    ??? H/O mammogram 07/28/2016    mammogram and Ultrasound of right breaset- benign finding- [facility]    ??? Hearing loss of both ears    ??? HTN (hypertension), benign 01/30/2014   ??? Hypothyroid     on med for control   ??? Osteoarthritis    ??? Osteoporosis    ??? Rheumatoid arthritis(714.0)     Dr Debbra Riding follows, on methotrexate for control   ??? Rheumatoid arthritis(714.0) 01/30/2014   ??? Sinusitis    ??? Unspecified hypothyroidism 01/30/2014   ??? Vitamin D deficiency      Past Surgical History:   Procedure Laterality Date   ??? HX CARPAL TUNNEL RELEASE Bilateral    ??? HX COLONOSCOPY      colon polyps   ??? 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 HX HEMORRHOIDECTOMY     ??? HX OTHER SURGICAL  09/2012    sinus surgery-Dr. Mickle Mallory   ??? 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 HX TONSILLECTOMY  76 yrs old     Social History     Social History   ??? Marital status: MARRIED     Social History Main Topics   ??? Smoking status: Former Smoker     Packs/day: 0.50     Years: 43.00   ??? Smokeless tobacco: Never Used      Comment: not regular when first started- 1pk/week   ??? Alcohol use No   ??? Drug use: No      No Known Allergies    Current Outpatient Prescriptions   Medication Sig   ??? gabapentin (NEURONTIN) 100 mg capsule Take  by mouth three (3) times daily.   ??? mupirocin (BACTROBAN) 2 % ointment Apply  to affected area daily.   ??? levothyroxine (SYNTHROID) 100 mcg tablet Take 1 Tab by mouth Daily (before breakfast).   ??? fluticasone (FLONASE) 50 mcg/actuation nasal spray 2 Sprays by Both Nostrils route daily.   ??? cyclobenzaprine (FLEXERIL) 10 mg tablet Take one tablet every evening when needed.   ??? bacitracin-neomycin-polymyxin b-hydrocortisone (CORTISPORIN) 1 % ointment Use samll amount 3-4 times a day.   ??? losartan (COZAAR) 100 mg tablet Take 1 Tab by mouth daily. Indications: hypertension   ??? mupirocin (BACTROBAN) 2 % ointment Apply  to affected area daily.   ??? denosumab (PROLIA) 60 mg/mL injection 60 mg by SubCUTAneous route.   ??? Cholecalciferol, Vitamin D3, 50,000 unit cap Take 1  Cap by mouth every Monday.   ??? methotrexate, PF, 25 mg/mL injection 25 mg every seven (7) days.   ??? nicotinic acid (NIACIN) 500 mg tablet Take 500 mg by mouth Daily (before breakfast). 2 po everyday.   ??? calcium-cholecalciferol, d3, (CALCIUM 600 + D) 600-125 mg-unit tab Take 2 Tabs by mouth.   ??? multivitamin (ONE A DAY) tablet Take 1 Tab by mouth daily.   ??? folic acid (FOLVITE) 1 mg tablet Take  by mouth daily.     Review of Systems - Negative except as stated in HPI  History obtained from chart review and the patient  General ROS: positive for  - fatigue and weight gain  negative for - fever  ENT ROS: positive for - sinus pain and right ear pain  Respiratory ROS: no cough, shortness of breath, or wheezing  Cardiovascular ROS: no chest pain or dyspnea on exertion    Visit Vitals   ??? BP 138/78 (BP 1 Location: Right arm, BP Patient Position: Sitting)   ??? Ht '5\' 6"'$  (1.676 m)   ??? Wt 173 lb 3.2 oz (78.6 kg)   ??? BMI 27.96 kg/m2     Physical Examination: General appearance - alert, well appearing, and in no distress   Mental status - alert, oriented to person, place, and time, normal mood, behavior, speech, dress, motor activity, and thought processes, depressed mood  Ears - right TM red, dull, bulging  Nose - mucosal congestion and mucosal erythema  Mouth - mucous membranes moist, pharynx normal without lesions  Neck - supple, no significant adenopathy, thyroid exam: thyroid is normal in size without nodules or tenderness  Chest - clear to auscultation, no wheezes, rales or rhonchi, symmetric air entry  Heart - normal rate, regular rhythm, normal S1, S2, no murmurs, rubs, clicks or gallops  Skin - DERMATITIS NOTED: erythematous maculopapular dermatitis on right scalp around ear    Assessment/Plan:  1. OM (otitis media), recurrent, right    2. Folliculitis    3. Acquired hypothyroidism      Orders Placed This Encounter   ??? METABOLIC PANEL, BASIC   ??? TSH 3RD GENERATION   ??? trimethoprim-sulfamethoxazole (BACTRIM DS) 160-800 mg per tablet     Sig: Take 1 Tab by mouth daily.     Dispense:  30 Tab     Refill:  0     Labs drawn today - will contact with results.  Bactrim as prescribed - directions for use and side effects discussed.  Continue topical mupirocin to right scalp folliculitis.  Cleanse with soap and water only.  Recommended OTC Mucinex BID.    Follow-up Disposition:  Return if symptoms worsen or fail to improve.    Selinda Orion, NP

## 2016-10-16 LAB — METABOLIC PANEL, BASIC
BUN/Creatinine ratio: 15 (ref 12–28)
BUN: 11 mg/dL (ref 8–27)
CO2: 27 mmol/L (ref 18–29)
Calcium: 9.4 mg/dL (ref 8.7–10.3)
Chloride: 100 mmol/L (ref 96–106)
Creatinine: 0.72 mg/dL (ref 0.57–1.00)
GFR est AA: 93 mL/min/{1.73_m2} (ref >59)
GFR est non-AA: 81 mL/min/{1.73_m2} (ref >59)
Glucose: 93 mg/dL (ref 65–99)
Potassium: 4.8 mmol/L (ref 3.5–5.2)
Sodium: 142 mmol/L (ref 134–144)

## 2016-10-16 LAB — TSH 3RD GENERATION: TSH: 1.63 u[IU]/mL (ref 0.450–4.500)

## 2016-10-16 NOTE — Progress Notes (Signed)
Labs are normal.

## 2016-10-17 NOTE — Progress Notes (Signed)
Unable to reach pt Left message for pt to call back.

## 2016-10-17 NOTE — Progress Notes (Signed)
Pt notified of lab results.

## 2016-12-09 ENCOUNTER — Ambulatory Visit: Admit: 2016-12-09 | Discharge: 2016-12-09 | Payer: MEDICARE | Attending: Internal Medicine | Primary: Internal Medicine

## 2016-12-09 DIAGNOSIS — M069 Rheumatoid arthritis, unspecified: Secondary | ICD-10-CM

## 2016-12-09 NOTE — Progress Notes (Signed)
HPI: Audrey Snow (DOB: Aug 16, 1940)    Still has itchy scalp on right behind and in front of her ear    Very HOH but still has not gotten hearing aides    Need to update labs related to her blood sugar    Still seeing Dr. Debbra Riding  Still seeing Dr. Mickle Mallory -ENT        Problem List:  Patient Active Problem List   Diagnosis Code   ??? Rheumatoid arthritis ([place]) M06.9   ??? Hypothyroidism E03.9   ??? Vitamin D deficiency E55.9   ??? Osteoporosis M81.0   ??? Gastroesophageal reflux disease without esophagitis K21.9   ??? HTN (hypertension), benign I10   ??? Environmental allergies Z91.09   ??? 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 Prediabetes R73.03       History:  Past Medical History:   Diagnosis Date   ??? Chronic obstructive pulmonary disease ([place])    ??? GERD (gastroesophageal reflux disease)    ??? H/O mammogram 07/28/2016    mammogram and Ultrasound of right breaset- benign finding- [facility]    ??? Hearing loss of both ears    ??? HTN (hypertension), benign 01/30/2014   ??? Hypothyroid     on med for control   ??? Osteoarthritis    ??? Osteoporosis    ??? Rheumatoid arthritis(714.0)     Dr Debbra Riding follows, on methotrexate for control   ??? Rheumatoid arthritis(714.0) 01/30/2014   ??? Sinusitis    ??? Unspecified hypothyroidism 01/30/2014   ??? Vitamin D deficiency        Allergies:  No Known Allergies    Current Medications:  Current Outpatient Prescriptions   Medication Sig Dispense Refill   ??? gabapentin (NEURONTIN) 100 mg capsule Take  by mouth three (3) times daily.     ??? denosumab (PROLIA) 60 mg/mL injection 60 mg by SubCUTAneous route.     ??? levothyroxine (SYNTHROID) 100 mcg tablet Take 1 Tab by mouth Daily (before breakfast). 30 Tab 11   ??? fluticasone (FLONASE) 50 mcg/actuation nasal spray 2 Sprays by Both Nostrils route daily. 1 Bottle 5   ??? cyclobenzaprine (FLEXERIL) 10 mg tablet Take one tablet every evening when needed. 20 Tab 5   ??? bacitracin-neomycin-polymyxin b-hydrocortisone (CORTISPORIN) 1 %  ointment Use samll amount 3-4 times a day. 15 g 5   ??? losartan (COZAAR) 100 mg tablet Take 1 Tab by mouth daily. Indications: hypertension 30 Tab 11   ??? Cholecalciferol, Vitamin D3, 50,000 unit cap Take 1 Cap by mouth every Monday.     ??? methotrexate, PF, 25 mg/mL injection 25 mg every seven (7) days.     ??? nicotinic acid (NIACIN) 500 mg tablet Take 500 mg by mouth Daily (before breakfast). 2 po everyday.     ??? calcium-cholecalciferol, d3, (CALCIUM 600 + D) 600-125 mg-unit tab Take 2 Tabs by mouth.     ??? multivitamin (ONE A DAY) tablet Take 1 Tab by mouth daily.     ??? folic acid (FOLVITE) 1 mg tablet Take  by mouth daily.     ??? mupirocin (BACTROBAN) 2 % ointment Apply  to affected area daily. 15 g 1   ??? mupirocin (BACTROBAN) 2 % ointment Apply  to affected area daily. 22 g 5       Review of Systems:  Review of Systems   HENT: Positive for congestion and hearing loss.    Respiratory: Negative for cough.    Skin: Positive for itching  and rash.   All other systems reviewed and are negative.      Vitals:  Visit Vitals   ??? BP 142/60   ??? Ht 5\' 6"  (1.676 m)   ??? Wt 173 lb 6.4 oz (78.7 kg)   ??? BMI 27.99 kg/m2       Physical Exam:  Physical Exam   Constitutional: She appears well-developed and well-nourished. No distress.   HENT:   Head: Normocephalic and atraumatic.   TM with evidence of previous site of tube-disruption of TM but no drainage   Neck: Normal range of motion. Neck supple.   Cardiovascular: Normal rate, regular rhythm and normal heart sounds.    Pulmonary/Chest: Effort normal and breath sounds normal.   Skin: Skin is warm and dry.   Scalp with area of dermatitis  Scaly and very dry esp on right    Psychiatric: She has a normal mood and affect. Her behavior is normal. Judgment and thought content normal.       Assessment/Plan:   Diagnoses and all orders for this visit:    1. Rheumatoid arthritis, involving unspecified site, unspecified rheumatoid factor presence (HCC)  -     METABOLIC PANEL, COMPREHENSIVE   -     VITAMIN D, 25 HYDROXY  -     CBC W/O DIFF  Continue to follow with Dr. Debbra Riding    2. Acquired hypothyroidism  UTD on TSH  3. HTN (hypertension), benign  -     CBC W/O DIFF  BP mildly elevated so monitor  4. Prediabetes  Update labs and watch sweets in diet  5. Folliculitis  Continue Bactroban topically and can try TGel shampoo  6. Mixed conductive and sensorineural hearing loss of both ears  Needs to see ENT and get hearing aides  7. Vitamin D deficiency  -     VITAMIN D, 25 HYDROXY  Update labs  8. Elevated blood sugar  -     HEMOGLOBIN A1C WITH EAG        [name], MD

## 2016-12-10 LAB — METABOLIC PANEL, COMPREHENSIVE
A-G Ratio: 1.7 (ref 1.2–2.2)
ALT (SGPT): 16 IU/L (ref 0–32)
AST (SGOT): 22 IU/L (ref 0–40)
Albumin: 4.5 g/dL (ref 3.5–4.8)
Alk. phosphatase: 53 IU/L (ref 39–117)
BUN/Creatinine ratio: 16 (ref 12–28)
BUN: 11 mg/dL (ref 8–27)
Bilirubin, total: 0.5 mg/dL (ref 0.0–1.2)
CO2: 25 mmol/L (ref 18–29)
Calcium: 9.1 mg/dL (ref 8.7–10.3)
Chloride: 102 mmol/L (ref 96–106)
Creatinine: 0.67 mg/dL (ref 0.57–1.00)
GFR est AA: 98 mL/min/{1.73_m2} (ref >59)
GFR est non-AA: 85 mL/min/{1.73_m2} (ref >59)
GLOBULIN, TOTAL: 2.7 g/dL (ref 1.5–4.5)
Glucose: 104 mg/dL — ABNORMAL HIGH (ref 65–99)
Potassium: 4.3 mmol/L (ref 3.5–5.2)
Protein, total: 7.2 g/dL (ref 6.0–8.5)
Sodium: 141 mmol/L (ref 134–144)

## 2016-12-10 LAB — CBC W/O DIFF
HCT: 43.6 % (ref 34.0–46.6)
HGB: 14.6 g/dL (ref 11.1–15.9)
MCH: 32.9 pg (ref 26.6–33.0)
MCHC: 33.5 g/dL (ref 31.5–35.7)
MCV: 98 fL — ABNORMAL HIGH (ref 79–97)
PLATELET: 250 10*3/uL (ref 150–379)
RBC: 4.44 x10E6/uL (ref 3.77–5.28)
RDW: 13.8 % (ref 12.3–15.4)
WBC: 6.6 10*3/uL (ref 3.4–10.8)

## 2016-12-10 LAB — HEMOGLOBIN A1C WITH EAG
Estimated average glucose: 131 mg/dL
Hemoglobin A1c: 6.2 % — ABNORMAL HIGH (ref 4.8–5.6)

## 2016-12-10 LAB — VITAMIN D, 25 HYDROXY: VITAMIN D, 25-HYDROXY: 27.4 ng/mL — ABNORMAL LOW (ref 30.0–100.0)

## 2016-12-10 NOTE — Progress Notes (Signed)
Continue to watch sweets in her diet- blood sugar remains mildly elevated  Needs more Vitamin D as well - can take Vitamin D3 2000 units daily for maintenance therapy  Copy to pt

## 2017-02-06 MED ORDER — FLUTICASONE 50 MCG/ACTUATION NASAL SPRAY, SUSP
50 mcg/actuation | Freq: Every day | NASAL | 5 refills | Status: DC
Start: 2017-02-06 — End: 2017-06-23

## 2017-02-13 MED ORDER — LEVOTHYROXINE 100 MCG TAB
100 mcg | ORAL_TABLET | Freq: Every day | ORAL | 11 refills | Status: DC
Start: 2017-02-13 — End: 2018-02-11

## 2017-02-19 MED ORDER — LOSARTAN 100 MG TAB
100 mg | ORAL_TABLET | ORAL | 11 refills | Status: DC
Start: 2017-02-19 — End: 2018-03-06

## 2017-03-13 ENCOUNTER — Ambulatory Visit: Admit: 2017-03-13 | Discharge: 2017-03-13 | Payer: MEDICARE | Attending: Internal Medicine | Primary: Internal Medicine

## 2017-03-13 DIAGNOSIS — M069 Rheumatoid arthritis, unspecified: Secondary | ICD-10-CM

## 2017-03-13 MED ORDER — TIZANIDINE 2 MG TAB
2 mg | ORAL_TABLET | ORAL | 1 refills | Status: DC
Start: 2017-03-13 — End: 2017-12-02

## 2017-03-13 NOTE — Progress Notes (Signed)
HPI: Audrey Snow (DOB: 07-16-1940)  Still working at [employer] gets muscle spasm of back while working and lifting    Vit D level mildly low on labs recently and taking it once a month      Problem List:  Patient Active Problem List   Diagnosis Code   ??? Rheumatoid arthritis ([place]) M06.9   ??? Hypothyroidism E03.9   ??? Vitamin D deficiency E55.9   ??? Osteoporosis M81.0   ??? Gastroesophageal reflux disease without esophagitis K21.9   ??? HTN (hypertension), benign I10   ??? Environmental allergies Z91.09   ??? 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 Prediabetes R73.03       History:  Past Medical History:   Diagnosis Date   ??? Chronic obstructive pulmonary disease ([place])    ??? GERD (gastroesophageal reflux disease)    ??? H/O mammogram 07/28/2016    mammogram and Ultrasound of right breaset- benign finding- [facility]    ??? Hearing loss of both ears    ??? HTN (hypertension), benign 01/30/2014   ??? Hypothyroid     on med for control   ??? Osteoarthritis    ??? Osteoporosis    ??? Rheumatoid arthritis(714.0)     Dr Debbra Riding follows, on methotrexate for control   ??? Rheumatoid arthritis(714.0) 01/30/2014   ??? Sinusitis    ??? Unspecified hypothyroidism 01/30/2014   ??? Vitamin D deficiency        Allergies:  No Known Allergies    Current Medications:  Current Outpatient Prescriptions   Medication Sig Dispense Refill   ??? tiZANidine (ZANAFLEX) 2 mg tablet One at bedtime as needed for muscle spasm  Indications: Muscle Spasm 20 Tab 1   ??? losartan (COZAAR) 100 mg tablet TAKE 1 TABLET BY MOUTH DAILY 30 Tab 11   ??? levothyroxine (SYNTHROID) 100 mcg tablet Take 1 Tab by mouth Daily (before breakfast). 30 Tab 11   ??? fluticasone (FLONASE) 50 mcg/actuation nasal spray 2 Sprays by Both Nostrils route daily. 1 Bottle 5   ??? gabapentin (NEURONTIN) 100 mg capsule Take  by mouth three (3) times daily.     ??? denosumab (PROLIA) 60 mg/mL injection 60 mg by SubCUTAneous route.      ??? mupirocin (BACTROBAN) 2 % ointment Apply  to affected area daily. 15 g 1   ??? bacitracin-neomycin-polymyxin b-hydrocortisone (CORTISPORIN) 1 % ointment Use samll amount 3-4 times a day. 15 g 5   ??? mupirocin (BACTROBAN) 2 % ointment Apply  to affected area daily. 22 g 5   ??? Cholecalciferol, Vitamin D3, 50,000 unit cap Take 1 Cap by mouth every Monday.     ??? methotrexate, PF, 25 mg/mL injection 25 mg every seven (7) days.     ??? nicotinic acid (NIACIN) 500 mg tablet Take 500 mg by mouth Daily (before breakfast). 2 po everyday.     ??? calcium-cholecalciferol, d3, (CALCIUM 600 + D) 600-125 mg-unit tab Take 2 Tabs by mouth.     ??? multivitamin (ONE A DAY) tablet Take 1 Tab by mouth daily.     ??? folic acid (FOLVITE) 1 mg tablet Take  by mouth daily.         Review of Systems:  Review of Systems   Musculoskeletal: Positive for back pain and myalgias.   All other systems reviewed and are negative.      Vitals:  Visit Vitals   ??? BP 130/70   ??? Ht 5\' 6"  (1.676 m)   ???  Wt 170 lb 9.6 oz (77.4 kg)   ??? BMI 27.54 kg/m2       Physical Exam:  Physical Exam   Constitutional: She is oriented to person, place, and time. She appears well-developed and well-nourished.   HENT:   Head: Normocephalic and atraumatic.   Cardiovascular: Normal rate, regular rhythm and normal heart sounds.    Pulmonary/Chest: Effort normal and breath sounds normal.   Musculoskeletal: Normal range of motion.   Mild tenderness at lumbar muscles to palpation   Neurological: She is alert and oriented to person, place, and time.   Skin: Skin is warm and dry.   Psychiatric: She has a normal mood and affect. Her behavior is normal. Judgment and thought content normal.       Assessment/Plan:   Diagnoses and all orders for this visit:    1. Rheumatoid arthritis, involving unspecified site, unspecified rheumatoid factor presence ([place])  Continue meds  2. HTN (hypertension), benign  stable  3. Spasm of muscle of lower back   Works at [employer] and sometimes lifting boxes and clothes causes low back pain and muscle spasm  So keeps RX on hand   So recommend change of Flexeril to Zanaflex due to less side effects  4. Vitamin D deficiency  On 50,000units monthly per Dr. Debbra Riding so will call her office and see if need to adjust dose  Other orders  -     tiZANidine (ZANAFLEX) 2 mg tablet; One at bedtime as needed for muscle spasm  Indications: Muscle Spasm        [name], MD

## 2017-06-03 ENCOUNTER — Ambulatory Visit: Admit: 2017-06-03 | Discharge: 2017-06-03 | Payer: MEDICARE | Attending: Internal Medicine | Primary: Internal Medicine

## 2017-06-03 DIAGNOSIS — J069 Acute upper respiratory infection, unspecified: Secondary | ICD-10-CM

## 2017-06-03 MED ORDER — MUPIROCIN 2 % OINTMENT
2 % | Freq: Every day | CUTANEOUS | 1 refills | Status: DC
Start: 2017-06-03 — End: 2018-01-27

## 2017-06-03 MED ORDER — CEFUROXIME AXETIL 250 MG TAB
250 mg | ORAL_TABLET | Freq: Two times a day (BID) | ORAL | 0 refills | Status: DC
Start: 2017-06-03 — End: 2017-06-23

## 2017-06-03 NOTE — Progress Notes (Signed)
HPI: Audrey Snow (DOB: October 28, 1940)    Has been having URI symptoms for over a week- tried OTC Dayquil but states broke out in a rash under both arms  But had rash under arms in past  Also c/o persistent "rash" /irritation behind right ear- still breaking out in bumps at times    Productive cough of yellowish sputum and has not had her Methotrexate this week    Due for labs and 7mo F/u       Problem List:  Patient Active Problem List   Diagnosis Code   ??? Rheumatoid arthritis ([place]) M06.9   ??? Hypothyroidism E03.9   ??? Vitamin D deficiency E55.9   ??? Osteoporosis M81.0   ??? Gastroesophageal reflux disease without esophagitis K21.9   ??? HTN (hypertension), benign I10   ??? Environmental allergies Z91.09   ??? 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 Prediabetes R73.03       History:  Past Medical History:   Diagnosis Date   ??? Chronic obstructive pulmonary disease ([place])    ??? GERD (gastroesophageal reflux disease)    ??? H/O mammogram 07/28/2016    mammogram and Ultrasound of right breaset- benign finding- [facility]    ??? Hearing loss of both ears    ??? HTN (hypertension), benign 01/30/2014   ??? Hypothyroid     on med for control   ??? Osteoarthritis    ??? Osteoporosis    ??? Rheumatoid arthritis(714.0)     Dr Debbra Riding follows, on methotrexate for control   ??? Rheumatoid arthritis(714.0) 01/30/2014   ??? Sinusitis    ??? Unspecified hypothyroidism 01/30/2014   ??? Vitamin D deficiency        Allergies:  No Known Allergies    Current Medications:  Current Outpatient Medications   Medication Sig Dispense Refill   ??? cefUROXime (CEFTIN) 250 mg tablet Take 1 Tab by mouth two (2) times a day. 20 Tab 0   ??? mupirocin (BACTROBAN) 2 % ointment Apply  to affected area daily. 15 g 1   ??? tiZANidine (ZANAFLEX) 2 mg tablet One at bedtime as needed for muscle spasm  Indications: Muscle Spasm 20 Tab 1   ??? losartan (COZAAR) 100 mg tablet TAKE 1 TABLET BY MOUTH DAILY 30 Tab 11   ??? levothyroxine (SYNTHROID) 100 mcg tablet Take 1 Tab by mouth Daily  (before breakfast). 30 Tab 11   ??? fluticasone (FLONASE) 50 mcg/actuation nasal spray 2 Sprays by Both Nostrils route daily. 1 Bottle 5   ??? gabapentin (NEURONTIN) 100 mg capsule Take  by mouth three (3) times daily.     ??? denosumab (PROLIA) 60 mg/mL injection 60 mg by SubCUTAneous route.     ??? bacitracin-neomycin-polymyxin b-hydrocortisone (CORTISPORIN) 1 % ointment Use samll amount 3-4 times a day. 15 g 5   ??? mupirocin (BACTROBAN) 2 % ointment Apply  to affected area daily. 22 g 5   ??? Cholecalciferol, Vitamin D3, 50,000 unit cap Take 1 Cap by mouth every Monday.     ??? methotrexate, PF, 25 mg/mL injection 25 mg every seven (7) days.     ??? nicotinic acid (NIACIN) 500 mg tablet Take 500 mg by mouth Daily (before breakfast). 2 po everyday.     ??? calcium-cholecalciferol, d3, (CALCIUM 600 + D) 600-125 mg-unit tab Take 2 Tabs by mouth.     ??? multivitamin (ONE A DAY) tablet Take 1 Tab by mouth daily.     ??? folic acid (FOLVITE) 1 mg  tablet Take  by mouth daily.         Review of Systems:  Review of Systems   HENT: Positive for congestion and hearing loss.    Respiratory: Positive for cough.    Skin: Positive for rash.   All other systems reviewed and are negative.      Vitals:  Visit Vitals  BP 142/80   Ht 5\' 6"  (1.676 m)   Wt 167 lb (75.8 kg)   BMI 26.95 kg/m??       Physical Exam:  Physical Exam   Constitutional: She is oriented to person, place, and time. She appears well-developed and well-nourished. No distress.   HENT:   Head: Normocephalic and atraumatic.   Neck: Normal range of motion. Neck supple.   Cardiovascular: Normal rate, regular rhythm and normal heart sounds.   Pulmonary/Chest: Effort normal and breath sounds normal. She has no wheezes.   Neurological: She is alert and oriented to person, place, and time.   Skin: Skin is warm and dry.   One area of folliculitis appearing lesion behind right ear  Redness of skin under both axilla   Psychiatric: She has a normal mood and affect. Her behavior is normal.  Judgment and thought content normal.       Assessment/Plan:   Diagnoses and all orders for this visit:    1. Upper respiratory tract infection, unspecified type  -     CBC WITH AUTOMATED DIFF  Start BID Ceftin for 10 days with Mucinex DM BID  2. Rheumatoid arthritis, involving unspecified site, unspecified rheumatoid factor presence (HCC)  -     CBC WITH AUTOMATED DIFF  -     METABOLIC PANEL, COMPREHENSIVE    3. HTN (hypertension), benign  -     METABOLIC PANEL, COMPREHENSIVE  BP elevated this am but just took her BP meds  4. Elevated blood sugar  -     HEMOGLOBIN A1C WITH EAG  Watch diet and update labs  5. Mixed hyperlipidemia  -     LIPID PANEL    6. Rash  Continue Bactroban behind her ear  If has an area that drains may need culture so call if erupts and worsens  7. Acquired hypothyroidism  -     TSH 3RD GENERATION  Update labs  Other orders  -     cefUROXime (CEFTIN) 250 mg tablet; Take 1 Tab by mouth two (2) times a day.  -     mupirocin (BACTROBAN) 2 % ointment; Apply  to affected area daily.        [name], MD

## 2017-06-03 NOTE — Progress Notes (Signed)
Blood sugar has continued to increase and if cannot get better control with diet - no added sweets  May have to start medications  Cholesterol is elevated as well  Thyroid is ok  Copy to pt

## 2017-06-04 LAB — CBC WITH AUTOMATED DIFF
ABS. BASOPHILS: 0 10*3/uL (ref 0.0–0.2)
ABS. EOSINOPHILS: 0.2 10*3/uL (ref 0.0–0.4)
ABS. IMM. GRANS.: 0 10*3/uL (ref 0.0–0.1)
ABS. MONOCYTES: 0.7 10*3/uL (ref 0.1–0.9)
ABS. NEUTROPHILS: 3.7 10*3/uL (ref 1.4–7.0)
Abs Lymphocytes: 1.8 10*3/uL (ref 0.7–3.1)
BASOPHILS: 0 %
EOSINOPHILS: 3 %
HCT: 40.7 % (ref 34.0–46.6)
HGB: 13.5 g/dL (ref 11.1–15.9)
IMMATURE GRANULOCYTES: 0 %
Lymphocytes: 27 %
MCH: 32.3 pg (ref 26.6–33.0)
MCHC: 33.2 g/dL (ref 31.5–35.7)
MCV: 97 fL (ref 79–97)
MONOCYTES: 11 %
NEUTROPHILS: 59 %
PLATELET: 265 10*3/uL (ref 150–379)
RBC: 4.18 x10E6/uL (ref 3.77–5.28)
RDW: 14.1 % (ref 12.3–15.4)
WBC: 6.4 10*3/uL (ref 3.4–10.8)

## 2017-06-04 LAB — LIPID PANEL
Cholesterol, total: 237 mg/dL — ABNORMAL HIGH (ref 100–199)
HDL Cholesterol: 54 mg/dL (ref >39)
LDL, calculated: 164 mg/dL — ABNORMAL HIGH (ref 0–99)
Triglyceride: 93 mg/dL (ref 0–149)
VLDL, calculated: 19 mg/dL (ref 5–40)

## 2017-06-04 LAB — METABOLIC PANEL, COMPREHENSIVE
A-G Ratio: 1.7 (ref 1.2–2.2)
ALT (SGPT): 12 IU/L (ref 0–32)
AST (SGOT): 14 IU/L (ref 0–40)
Albumin: 4.5 g/dL (ref 3.5–4.8)
Alk. phosphatase: 50 IU/L (ref 39–117)
BUN/Creatinine ratio: 17 (ref 12–28)
BUN: 14 mg/dL (ref 8–27)
Bilirubin, total: 0.7 mg/dL (ref 0.0–1.2)
CO2: 26 mmol/L (ref 20–29)
Calcium: 9.7 mg/dL (ref 8.7–10.3)
Chloride: 98 mmol/L (ref 96–106)
Creatinine: 0.84 mg/dL (ref 0.57–1.00)
GFR est AA: 78 mL/min/{1.73_m2} (ref >59)
GFR est non-AA: 67 mL/min/{1.73_m2} (ref >59)
GLOBULIN, TOTAL: 2.6 g/dL (ref 1.5–4.5)
Glucose: 106 mg/dL — ABNORMAL HIGH (ref 65–99)
Potassium: 4.4 mmol/L (ref 3.5–5.2)
Protein, total: 7.1 g/dL (ref 6.0–8.5)
Sodium: 139 mmol/L (ref 134–144)

## 2017-06-04 LAB — TSH 3RD GENERATION: TSH: 2.05 u[IU]/mL (ref 0.450–4.500)

## 2017-06-04 LAB — HEMOGLOBIN A1C WITH EAG
Estimated average glucose: 137 mg/dL
Hemoglobin A1c: 6.4 % — ABNORMAL HIGH (ref 4.8–5.6)

## 2017-06-09 ENCOUNTER — Encounter: Attending: Internal Medicine | Primary: Internal Medicine

## 2017-06-23 ENCOUNTER — Ambulatory Visit: Admit: 2017-06-23 | Discharge: 2017-06-23 | Payer: MEDICARE | Attending: Family | Primary: Internal Medicine

## 2017-06-23 DIAGNOSIS — J309 Allergic rhinitis, unspecified: Secondary | ICD-10-CM

## 2017-06-23 MED ORDER — FLUTICASONE 50 MCG/ACTUATION NASAL SPRAY, SUSP
50 mcg/actuation | Freq: Every day | NASAL | 5 refills | Status: DC
Start: 2017-06-23 — End: 2018-06-25

## 2017-06-23 NOTE — Progress Notes (Signed)
Patient's husband notified.

## 2017-06-23 NOTE — Progress Notes (Signed)
Audrey Snow (DOB: [birth year]) presents today c/o nausea, head congestion, sinus pain, shortness of breath x several weeks.  She was treated with a ten-day course of Ceftin 250mg  BID starting on 06/03/2017. She had her infusion last week and labs were drawn. Her CBC was normal with WBC 6. Sed rate was elevated at 70.    Chief Complaint   Patient presents with   ??? Fatigue     Patient Active Problem List   Diagnosis Code   ??? Rheumatoid arthritis ([place]) M06.9   ??? Hypothyroidism E03.9   ??? Vitamin D deficiency E55.9   ??? Osteoporosis M81.0   ??? Gastroesophageal reflux disease without esophagitis K21.9   ??? HTN (hypertension), benign I10   ??? Environmental allergies Z91.09   ??? 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 Prediabetes R73.03     Past Medical History:   Diagnosis Date   ??? Chronic obstructive pulmonary disease ([place])    ??? GERD (gastroesophageal reflux disease)    ??? H/O mammogram 07/28/2016    mammogram and Ultrasound of right breaset- benign finding- [facility]    ??? Hearing loss of both ears    ??? HTN (hypertension), benign 01/30/2014   ??? Hypothyroid     on med for control   ??? Osteoarthritis    ??? Osteoporosis    ??? Rheumatoid arthritis(714.0)     Dr Debbra Riding follows, on methotrexate for control   ??? Rheumatoid arthritis(714.0) 01/30/2014   ??? Sinusitis    ??? Unspecified hypothyroidism 01/30/2014   ??? Vitamin D deficiency      Past Surgical History:   Procedure Laterality Date   ??? HX CARPAL TUNNEL RELEASE Bilateral    ??? HX COLONOSCOPY      colon polyps   ??? 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 HX HEMORRHOIDECTOMY     ??? HX OTHER SURGICAL  09/2012    sinus surgery-Dr. Mickle Mallory   ??? 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 HX TONSILLECTOMY  76 yrs old     Social History     Socioeconomic History   ??? Marital status: MARRIED   Tobacco Use   ??? Smoking status: Former Smoker     Packs/day: 0.50     Years: 43.00     Pack years: 21.50   ??? Smokeless tobacco: Never Used    ??? Tobacco comment: not regular when first started- 1pk/week   Substance and Sexual Activity   ??? Alcohol use: No     Alcohol/week: 0.0 oz   ??? Drug use: No   Social History Narrative     No Known Allergies     Current Outpatient Medications   Medication Sig   ??? mupirocin (BACTROBAN) 2 % ointment Apply  to affected area daily.   ??? tiZANidine (ZANAFLEX) 2 mg tablet One at bedtime as needed for muscle spasm  Indications: Muscle Spasm   ??? losartan (COZAAR) 100 mg tablet TAKE 1 TABLET BY MOUTH DAILY   ??? levothyroxine (SYNTHROID) 100 mcg tablet Take 1 Tab by mouth Daily (before breakfast).   ??? fluticasone (FLONASE) 50 mcg/actuation nasal spray 2 Sprays by Both Nostrils route daily.   ??? gabapentin (NEURONTIN) 100 mg capsule Take  by mouth three (3) times daily.   ??? denosumab (PROLIA) 60 mg/mL injection 60 mg by SubCUTAneous route.   ??? bacitracin-neomycin-polymyxin b-hydrocortisone (CORTISPORIN) 1 % ointment Use samll amount 3-4  times a day.   ??? mupirocin (BACTROBAN) 2 % ointment Apply  to affected area daily.   ??? Cholecalciferol, Vitamin D3, 50,000 unit cap Take 1 Cap by mouth every Monday.   ??? methotrexate, PF, 25 mg/mL injection 25 mg every seven (7) days.   ??? nicotinic acid (NIACIN) 500 mg tablet Take 500 mg by mouth Daily (before breakfast). 2 po everyday.   ??? calcium-cholecalciferol, d3, (CALCIUM 600 + D) 600-125 mg-unit tab Take 2 Tabs by mouth.   ??? multivitamin (ONE A DAY) tablet Take 1 Tab by mouth daily.   ??? folic acid (FOLVITE) 1 mg tablet Take  by mouth daily.     Review of Systems - Negative except as stated in HPI  History obtained from chart review and the patient  General ROS: positive for - fatigue  Negative for - fever  ENT ROS: positive for - headaches, nasal congestion and nasal discharge  Respiratory ROS: no cough, shortness of breath, or wheezing    Visit Vitals  BP 138/72 (BP 1 Location: Left arm, BP Patient Position: Sitting)   Ht 5\' 6"  (1.676 m)   Wt 163 lb (73.9 kg)   BMI 26.31 kg/m??      Physical Examination: General appearance - alert, well appearing, and in no distress, oriented to person, place, and time and normal appearing weight  Mental status - alert, oriented to person, place, and time, normal mood, behavior, speech, dress, motor activity, and thought processes, affect appropriate to mood  Ears - left ear normal, right ear with chronic TM perforation  Nose - normal and patent, no erythema, discharge or polyps  Mouth - mucous membranes moist, pharynx normal without lesions  Neck - supple, no significant adenopathy, thyroid exam: thyroid is normal in size without nodules or tenderness  Chest - clear to auscultation, no wheezes, rales or rhonchi, symmetric air entry  Heart - normal rate, regular rhythm, normal S1, S2, no murmurs, rubs, clicks or gallops    Assessment/Plan:  1. Chronic allergic rhinitis    2. Fatigue, unspecified type    3. Rheumatoid arthritis, involving unspecified site, unspecified rheumatoid factor presence (HCC)    4. Elevated sed rate    5. Elevated serum creatinine      Orders Placed This Encounter   ??? METABOLIC PANEL, COMPREHENSIVE   ??? fluticasone (FLONASE) 50 mcg/actuation nasal spray     Sig: 2 Sprays by Both Nostrils route daily.     Dispense:  1 Bottle     Refill:  5     CMET drawn today -will contact with results.  Restart Flonase as prescribed - directions for use and side effects discussed.  Restart Mucinex BID.  Increase water intake.    Follow-up Disposition:  Return if symptoms worsen or fail to improve.    Selinda Orion, NP

## 2017-06-23 NOTE — Progress Notes (Signed)
Labs are normal.

## 2017-06-24 LAB — METABOLIC PANEL, COMPREHENSIVE
A-G Ratio: 1.4 (ref 1.2–2.2)
ALT (SGPT): 16 IU/L (ref 0–32)
AST (SGOT): 22 IU/L (ref 0–40)
Albumin: 4.2 g/dL (ref 3.5–4.8)
Alk. phosphatase: 54 IU/L (ref 39–117)
BUN/Creatinine ratio: 19 (ref 12–28)
BUN: 14 mg/dL (ref 8–27)
Bilirubin, total: 0.7 mg/dL (ref 0.0–1.2)
CO2: 25 mmol/L (ref 20–29)
Calcium: 9.1 mg/dL (ref 8.7–10.3)
Chloride: 100 mmol/L (ref 96–106)
Creatinine: 0.73 mg/dL (ref 0.57–1.00)
GFR est AA: 92 mL/min/{1.73_m2} (ref >59)
GFR est non-AA: 80 mL/min/{1.73_m2} (ref >59)
GLOBULIN, TOTAL: 2.9 g/dL (ref 1.5–4.5)
Glucose: 87 mg/dL (ref 65–99)
Potassium: 4.4 mmol/L (ref 3.5–5.2)
Protein, total: 7.1 g/dL (ref 6.0–8.5)
Sodium: 138 mmol/L (ref 134–144)

## 2017-09-04 ENCOUNTER — Encounter

## 2017-12-02 ENCOUNTER — Ambulatory Visit: Attending: Internal Medicine | Primary: Internal Medicine

## 2017-12-02 ENCOUNTER — Ambulatory Visit: Admit: 2017-12-02 | Discharge: 2017-12-02 | Payer: MEDICARE | Attending: Internal Medicine | Primary: Internal Medicine

## 2017-12-02 ENCOUNTER — Encounter: Attending: Internal Medicine | Primary: Internal Medicine

## 2017-12-02 DIAGNOSIS — Z0000 Encounter for general adult medical examination without abnormal findings: Secondary | ICD-10-CM

## 2017-12-02 MED ORDER — CEFUROXIME AXETIL 250 MG TAB
250 mg | ORAL_TABLET | Freq: Two times a day (BID) | ORAL | 0 refills | Status: DC
Start: 2017-12-02 — End: 2019-01-04

## 2017-12-02 MED ORDER — VARICELLA-ZOSTER GLYCOE VACC-AS01B ADJ(PF) 50 MCG/0.5 ML IM SUSPENSION
50 mcg/0.5 mL | Freq: Once | INTRAMUSCULAR | 1 refills | Status: AC
Start: 2017-12-02 — End: 2017-12-02

## 2017-12-02 NOTE — Progress Notes (Signed)
Progress Notes by [name], MD at 12/02/17 1040                Author: [name], MD  Service: --  Author Type: Physician       Filed: 12/02/17 1414  Encounter Date: 12/02/2017  Status: Signed          Editor: [name], MD (Physician)                       HPI: Audrey Snow (DOB: 1940/10/24 )    Medicare wellness      Having some lower back pain on the right side for several days   Does have to do some lifting with her job at [employer]      Has been having sinus congestion but now with cough of productive dark/yellowish sputum      Had labs thru Dr. Debbra Riding      Needs to have her colon eval updated with her history of colon polyps      Needs to have her Mammogram updated and will change to [facility]      Problem List:     Patient Active Problem List        Diagnosis  Code         ?  Rheumatoid arthritis ([place])  M06.9     ?  Hypothyroidism  E03.9     ?  Vitamin D deficiency  E55.9     ?  Osteoporosis  M81.0     ?  Gastroesophageal reflux disease without esophagitis  K21.9     ?  HTN (hypertension), benign  I10     ?  Environmental allergies  Z91.09     ?  Chronic right-sided low back pain without sciatica  M54.5, G89.29         ?  Prediabetes  R73.03           History:     Past Medical History:        Diagnosis  Date         ?  Chronic obstructive pulmonary disease (HCC)       ?  GERD (gastroesophageal reflux disease)       ?  H/O mammogram  07/28/2016          mammogram and Ultrasound of right breaset- benign finding- [facility]          ?  Hearing loss of both ears       ?  HTN (hypertension), benign  01/30/2014     ?  Hypothyroid            on med for control         ?  Osteoarthritis       ?  Osteoporosis       ?  Rheumatoid arthritis(714.0)            Dr Debbra Riding follows, on methotrexate for control         ?  Rheumatoid arthritis(714.0)  01/30/2014     ?  Sinusitis       ?  Unspecified hypothyroidism  01/30/2014         ?  Vitamin D deficiency             Allergies:   No Known Allergies       Current Medications:     Current Outpatient Medications          Medication  Sig  Dispense  Refill           ?  varicella-zoster recombinant, PF, (SHINGRIX, PF,) 50 mcg/0.5 mL susr injection  0.5 mL by IntraMUSCular route once for 1 dose.  0.5 mL  1     ?  cefUROXime (CEFTIN) 250 mg tablet  Take 1 Tab by mouth two (2) times a day.  20 Tab  0     ?  fluticasone (FLONASE) 50 mcg/actuation nasal spray  2 Sprays by Both Nostrils route daily.  1 Bottle  5     ?  mupirocin (BACTROBAN) 2 % ointment  Apply  to affected area daily.  15 g  1     ?  losartan (COZAAR) 100 mg tablet  TAKE 1 TABLET BY MOUTH DAILY  30 Tab  11     ?  levothyroxine (SYNTHROID) 100 mcg tablet  Take 1 Tab by mouth Daily (before breakfast).  30 Tab  11     ?  gabapentin (NEURONTIN) 100 mg capsule  Take  by mouth three (3) times daily.         ?  denosumab (PROLIA) 60 mg/mL injection  60 mg by SubCUTAneous route.         ?  bacitracin-neomycin-polymyxin b-hydrocortisone (CORTISPORIN) 1 % ointment  Use samll amount 3-4 times a day.  15 g  5     ?  mupirocin (BACTROBAN) 2 % ointment  Apply  to affected area daily.  22 g  5     ?  Cholecalciferol, Vitamin D3, 50,000 unit cap  Take 1 Cap by mouth every Monday.         ?  methotrexate, PF, 25 mg/mL injection  25 mg every seven (7) days.         ?  nicotinic acid (NIACIN) 500 mg tablet  Take 500 mg by mouth Daily (before breakfast). 2 po everyday.         ?  calcium-cholecalciferol, d3, (CALCIUM 600 + D) 600-125 mg-unit tab  Take 2 Tabs by mouth.         ?  multivitamin (ONE A DAY) tablet  Take 1 Tab by mouth daily.               ?  folic acid (FOLVITE) 1 mg tablet  Take  by mouth daily.              Immunization History        Administered  Date(s) Administered         ?  Influenza High Dose Vaccine PF  04/22/2011, 03/17/2013, 04/06/2015, 05/21/2016, 05/07/2017     ?  Influenza Vaccine  04/13/2016     ?  Influenza Vaccine PF  06/22/2003, 06/10/2005, 06/03/2006, 04/19/2008, 05/07/2009, 05/24/2010     ?   Pneumococcal Conjugate (PCV-13)  07/19/2015     ?  Pneumococcal Polysaccharide (PPSV-23)  03/29/2010         ?  TB Skin Test (PPD) Intradermal  06/13/2003, 10/31/2004, 12/16/2005, 02/01/2007, 04/26/2008, 05/23/2009           Review of Systems:   Review of Systems    HENT: Positive for congestion.     Respiratory: Positive for cough and sputum production . Negative for shortness of breath and wheezing.     Musculoskeletal: Positive for back pain and joint pain .    All other systems reviewed and are negative.         Vitals:   Visit  Vitals      BP  132/70     Temp  97.3 ??F (36.3 ??C)     Ht  5\' 6"  (1.676 m)     Wt  162 lb (73.5 kg)        BMI  26.15 kg/m??           Physical Exam:   Physical Exam    Constitutional: She is oriented to person, place, and time. She appears well-developed and well-nourished.    HENT:    Head: Normocephalic and atraumatic.    Neck: Normal range of motion. Neck supple.    Cardiovascular: Normal rate, regular rhythm and normal heart sounds.    Pulmonary/Chest: Effort normal and breath sounds normal. She has no wheezes.   Musculoskeletal: Normal range of motion. She exhibits  tenderness.   Mild lower back tenderness to palpation on right  Ambulatory  No CVAT    Neurological: She is alert and oriented to person, place, and time.    Skin: Skin is warm and dry.   Psychiatric: She has a normal mood and affect. Her behavior is normal. Judgment and thought content  normal.          Assessment/Plan:    Diagnoses and all orders for this visit:      1. Encounter for Medicare annual wellness exam   -     MAM MAMMO BI SCREENING INCL CAD; Future   RX for Shingrix   2. Vitamin D deficiency      3. Prediabetes   Watch sweets in diet   4. HTN (hypertension), benign   Monitor on present meds   5. Acquired hypothyroidism    monitor   6. Upper respiratory tract infection, unspecified type    increase fluids, Mucinex prn, RX for Ceftin   7. Osteoporosis, unspecified osteoporosis type, unspecified pathological  fracture presence   Continue treatment with Rheum   8. Personal history of colonic polyps   -     REFERRAL FOR COLONOSCOPY   Requests Dr. Kathreen Devoid who has seen her husband   5. Screening mammogram, encounter for   -     MAM MAMMO BI SCREENING INCL CAD; Future      10. Rheumatoid arthritis, involving unspecified site, unspecified rheumatoid factor presence ([place])   Follow with Dr. Debbra Riding   11. Chronic right-sided low back pain without sciatica   Handout on treatment options and exercises to do and try topical Menthol   Other orders   -     varicella-zoster recombinant, PF, (SHINGRIX, PF,) 50 mcg/0.5 mL susr injection; 0.5 mL by IntraMUSCular route once for 1 dose.   -     cefUROXime (CEFTIN) 250 mg tablet; Take 1 Tab by mouth two (2) times a day.            [name], MD         This is the Subsequent Medicare Annual Wellness Exam, performed 12 months or more after the Initial AWV or the last Subsequent AWV      I have reviewed the patient's medical history in detail and updated the computerized patient record.         History          Past Medical History:        Diagnosis  Date         ?  Chronic obstructive pulmonary disease (HCC)       ?  GERD (gastroesophageal reflux disease)       ?  H/O mammogram  07/28/2016          mammogram and Ultrasound of right breaset- benign finding- [facility]          ?  Hearing loss of both ears       ?  HTN (hypertension), benign  01/30/2014     ?  Hypothyroid            on med for control         ?  Osteoarthritis       ?  Osteoporosis       ?  Rheumatoid arthritis(714.0)            Dr Debbra Riding follows, on methotrexate for control         ?  Rheumatoid arthritis(714.0)  01/30/2014     ?  Sinusitis       ?  Unspecified hypothyroidism  01/30/2014         ?  Vitamin D deficiency             Past Surgical History:         Procedure  Laterality  Date          ?  HX CARPAL TUNNEL RELEASE  Bilateral       ?  HX COLONOSCOPY              colon polyps          ?  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  HX HEMORRHOIDECTOMY         ?  HX OTHER SURGICAL    09/2012          sinus surgery-Dr. Mickle Mallory          ?  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  HX TONSILLECTOMY    77 yrs old          Current Outpatient Medications          Medication  Sig  Dispense  Refill           ?  varicella-zoster recombinant, PF, (SHINGRIX, PF,) 50 mcg/0.5 mL susr injection  0.5 mL by IntraMUSCular route once for 1 dose.  0.5 mL  1     ?  cefUROXime (CEFTIN) 250 mg tablet  Take 1 Tab by mouth two (2) times a day.  20 Tab  0     ?  fluticasone (FLONASE) 50 mcg/actuation nasal spray  2 Sprays by Both Nostrils route daily.  1 Bottle  5     ?  mupirocin (BACTROBAN) 2 % ointment  Apply  to affected area daily.  15 g  1     ?  losartan (COZAAR) 100 mg tablet  TAKE 1 TABLET BY MOUTH DAILY  30 Tab  11     ?  levothyroxine (SYNTHROID) 100 mcg tablet  Take 1 Tab by mouth Daily (before breakfast).  30 Tab  11     ?  gabapentin (NEURONTIN) 100 mg capsule  Take  by mouth three (3) times daily.         ?  denosumab (PROLIA) 60 mg/mL injection  60 mg by SubCUTAneous route.         ?  bacitracin-neomycin-polymyxin  b-hydrocortisone (CORTISPORIN) 1 % ointment  Use samll amount 3-4 times a day.  15 g  5     ?  mupirocin (BACTROBAN) 2 % ointment  Apply  to affected area daily.  22 g  5     ?  Cholecalciferol, Vitamin D3, 50,000 unit cap  Take 1 Cap by mouth every Monday.         ?  methotrexate, PF, 25 mg/mL injection  25 mg every seven (7) days.         ?  nicotinic acid (NIACIN) 500 mg tablet  Take 500 mg by mouth Daily (before breakfast). 2 po everyday.         ?  calcium-cholecalciferol, d3, (CALCIUM 600 + D) 600-125 mg-unit tab  Take 2 Tabs by mouth.         ?  multivitamin (ONE A DAY) tablet  Take 1 Tab by mouth daily.               ?  folic acid (FOLVITE) 1 mg tablet  Take  by mouth daily.            No Known Allergies     Family History         Problem  Relation  Age  of Onset          ?  Arthritis-osteo  Mother       ?  Alzheimer  Father            ?  Breast Cancer  Neg Hx            Social History          Tobacco Use         ?  Smoking status:  Former Smoker              Packs/day:  0.50              Years:  43.00              Pack years:  21.50         ?  Smokeless tobacco:  Never Used        ?  Tobacco comment: not regular when first started- 1pk/week       Substance Use Topics         ?  Alcohol use:  No              Alcohol/week:  0.0 oz          Patient Active Problem List        Diagnosis  Code         ?  Rheumatoid arthritis ([place])  M06.9     ?  Hypothyroidism  E03.9     ?  Vitamin D deficiency  E55.9     ?  Osteoporosis  M81.0     ?  Gastroesophageal reflux disease without esophagitis  K21.9     ?  HTN (hypertension), benign  I10     ?  Environmental allergies  Z91.09     ?  Chronic right-sided low back pain without sciatica  M54.5, G89.29         ?  Prediabetes  R73.03             Depression Risk Factor Screening:           3 most recent PHQ Screens  12/02/2017        Little interest or pleasure in  doing things  Not at all     Feeling down, depressed, irritable, or hopeless  Several days        Total Score PHQ 2  1          Alcohol Risk Factor Screening:     You do not drink alcohol or very rarely.        Functional Ability and Level of Safety:     Hearing Loss   The patient needs further evaluation.needs to be wearing aides      Activities of Daily Living   The home contains: handrails and grab bars   Patient does total self care      Fall Risk      Fall Risk Assessment, last 12 mths  12/02/2017        Able to walk?  Yes        Fall in past 12 months?  No           Abuse Screen   Patient is not abused        Cognitive Screening     Evaluation of Cognitive Function:   Has your family/caregiver stated any concerns about your memory: no   Normal        Patient Care Team     Patient Care Team:   [name], MD as PCP - General (Internal Medicine)         Assessment/Plan     Education and counseling provided:   Are appropriate based on today's review and evaluation   Influenza Vaccine   Screening Mammography   Colorectal cancer screening tests   Screening for glaucoma      Diagnoses and all orders for this visit:      1. Encounter for Medicare annual wellness exam   -     MAM MAMMO BI SCREENING INCL CAD; Future      2. Vitamin D deficiency      3. Prediabetes      4. HTN (hypertension), benign      5. Acquired hypothyroidism      6. Upper respiratory tract infection, unspecified type      7. Osteoporosis, unspecified osteoporosis type, unspecified pathological fracture presence      8. Personal history of colonic polyps   -     REFERRAL FOR COLONOSCOPY      9. Screening mammogram, encounter for   -     MAM MAMMO BI SCREENING INCL CAD; Future      10. Rheumatoid arthritis, involving unspecified site, unspecified rheumatoid factor presence ([place])      11. Chronic right-sided low back pain without sciatica      Other orders   -     varicella-zoster recombinant, PF, (SHINGRIX, PF,) 50 mcg/0.5 mL susr injection; 0.5 mL by IntraMUSCular route once for 1 dose.   -     cefUROXime (CEFTIN) 250 mg tablet; Take 1 Tab by mouth two (2) times a day.              Health Maintenance Due        Topic  Date Due         ?  DTaP/Tdap/Td series (1 - Tdap)  08/07/1960     ?  Shingrix Vaccine Age 59> (1 of 2)  08/07/1989         ?  GLAUCOMA SCREENING Q2Y   08/07/2004

## 2017-12-02 NOTE — Patient Instructions (Signed)
Medicare Wellness Visit, Female     The best way to live healthy is to have a lifestyle where you eat a well-balanced diet, exercise regularly, limit alcohol use, and quit all forms of tobacco/nicotine, if applicable.   Regular preventive services are another way to keep healthy. Preventive services (vaccines, screening tests, monitoring & exams) can help personalize your care plan, which helps you manage your own care. Screening tests can find health problems at the earliest stages, when they are easiest to treat.   [facility] follows the current, evidence-based guidelines published by the [place] [employer] (USPSTF) when recommending preventive services for our patients. Because we follow these guidelines, sometimes recommendations change over time as research supports it. (For example, mammograms used to be recommended annually. Even though Medicare will still pay for an annual mammogram, the newer guidelines recommend a mammogram every two years for women of average risk.)  Of course, you and your doctor may decide to screen more often for some diseases, based on your risk and your health status.   Preventive services for you include:  - Medicare offers their members a free annual wellness visit, which is time for you and your primary care provider to discuss and plan for your preventive service needs. Take advantage of this benefit every year!  -All adults over the age of 37 should receive the recommended pneumonia vaccines. Current USPSTF guidelines recommend a series of two vaccines for the best pneumonia protection.   -All adults should have a flu vaccine yearly and a tetanus vaccine every 10 years. All adults age 18 and older should receive a shingles vaccine once in their lifetime.    -A bone mass density test is recommended when a woman turns 65 to screen for osteoporosis. This test is only recommended one time, as a screening.  Some providers will use this same test as a disease monitoring tool if you already have osteoporosis.  -All adults age 3-70 who are overweight should have a diabetes screening test once every three years.   -Other screening tests and preventive services for persons with diabetes include: an eye exam to screen for diabetic retinopathy, a kidney function test, a foot exam, and stricter control over your cholesterol.   -Cardiovascular screening for adults with routine risk involves an electrocardiogram (ECG) at intervals determined by your doctor.   -Colorectal cancer screenings should be done for adults age 69-75 with no increased risk factors for colorectal cancer.  There are a number of acceptable methods of screening for this type of cancer. Each test has its own benefits and drawbacks. Discuss with your doctor what is most appropriate for you during your annual wellness visit. The different tests include: colonoscopy (considered the best screening method), a fecal occult blood test, a fecal DNA test, and sigmoidoscopy.  -Breast cancer screenings are recommended every other year for women of normal risk, age 16-74.  -Cervical cancer screenings for women over age 96 are only recommended with certain risk factors.   -All adults born between [place] should be screened once for Hepatitis C.     Here is a list of your current Health Maintenance items (your personalized list of preventive services) with a due date:  Health Maintenance Due   Topic Date Due   ??? DTaP/Tdap/Td  (1 - Tdap) 08/07/1960   ??? Shingles Vaccine (1 of 2) 08/07/1989   ??? Glaucoma Screening   08/07/2004

## 2017-12-02 NOTE — Progress Notes (Signed)
HPI: Audrey Snow (DOB: [birth year])   Medicare wellness    Having some lower back pain on the right side for several days  Does have to do some lifting with her job at [employer]    Has been having sinus congestion but now with cough of productive dark/yellowish sputum    Had labs thru Dr. Debbra Riding    Needs to have her colon eval updated with her history of colon polyps    Needs to have her Mammogram updated and will change to [facility]    Problem List:  Patient Active Problem List   Diagnosis Code   ??? Rheumatoid arthritis ([place]) M06.9   ??? Hypothyroidism E03.9   ??? Vitamin D deficiency E55.9   ??? Osteoporosis M81.0   ??? Gastroesophageal reflux disease without esophagitis K21.9   ??? HTN (hypertension), benign I10   ??? Environmental allergies Z91.09   ??? 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 Prediabetes R73.03       History:  Past Medical History:   Diagnosis Date   ??? Chronic obstructive pulmonary disease ([place])    ??? GERD (gastroesophageal reflux disease)    ??? H/O mammogram 07/28/2016    mammogram and Ultrasound of right breaset- benign finding- [facility]    ??? Hearing loss of both ears    ??? HTN (hypertension), benign 01/30/2014   ??? Hypothyroid     on med for control   ??? Osteoarthritis    ??? Osteoporosis    ??? Rheumatoid arthritis(714.0)     Dr Debbra Riding follows, on methotrexate for control   ??? Rheumatoid arthritis(714.0) 01/30/2014   ??? Sinusitis    ??? Unspecified hypothyroidism 01/30/2014   ??? Vitamin D deficiency        Allergies:  No Known Allergies    Current Medications:  Current Outpatient Medications   Medication Sig Dispense Refill   ??? varicella-zoster recombinant, PF, (SHINGRIX, PF,) 50 mcg/0.5 mL susr injection 0.5 mL by IntraMUSCular route once for 1 dose. 0.5 mL 1   ??? cefUROXime (CEFTIN) 250 mg tablet Take 1 Tab by mouth two (2) times a day. 20 Tab 0   ??? fluticasone (FLONASE) 50 mcg/actuation nasal spray 2 Sprays by Both Nostrils route daily. 1 Bottle 5    ??? mupirocin (BACTROBAN) 2 % ointment Apply  to affected area daily. 15 g 1   ??? losartan (COZAAR) 100 mg tablet TAKE 1 TABLET BY MOUTH DAILY 30 Tab 11   ??? levothyroxine (SYNTHROID) 100 mcg tablet Take 1 Tab by mouth Daily (before breakfast). 30 Tab 11   ??? gabapentin (NEURONTIN) 100 mg capsule Take  by mouth three (3) times daily.     ??? denosumab (PROLIA) 60 mg/mL injection 60 mg by SubCUTAneous route.     ??? bacitracin-neomycin-polymyxin b-hydrocortisone (CORTISPORIN) 1 % ointment Use samll amount 3-4 times a day. 15 g 5   ??? mupirocin (BACTROBAN) 2 % ointment Apply  to affected area daily. 22 g 5   ??? Cholecalciferol, Vitamin D3, 50,000 unit cap Take 1 Cap by mouth every Monday.     ??? methotrexate, PF, 25 mg/mL injection 25 mg every seven (7) days.     ??? nicotinic acid (NIACIN) 500 mg tablet Take 500 mg by mouth Daily (before breakfast). 2 po everyday.     ??? calcium-cholecalciferol, d3, (CALCIUM 600 + D) 600-125 mg-unit tab Take 2 Tabs by mouth.     ??? multivitamin (ONE A DAY) tablet Take 1 Tab by mouth daily.     ???  folic acid (FOLVITE) 1 mg tablet Take  by mouth daily.       Immunization History   Administered Date(s) Administered   ??? Influenza High Dose Vaccine PF 04/22/2011, 03/17/2013, 04/06/2015, 05/21/2016, 05/07/2017   ??? Influenza Vaccine 04/13/2016   ??? Influenza Vaccine PF 06/22/2003, 06/10/2005, 06/03/2006, 04/19/2008, 05/07/2009, 05/24/2010   ??? Pneumococcal Conjugate (PCV-13) 07/19/2015   ??? Pneumococcal Polysaccharide (PPSV-23) 03/29/2010   ??? TB Skin Test (PPD) Intradermal 06/13/2003, 10/31/2004, 12/16/2005, 02/01/2007, 04/26/2008, 05/23/2009       Review of Systems:  Review of Systems   HENT: Positive for congestion.    Respiratory: Positive for cough and sputum production. Negative for shortness of breath and wheezing.    Musculoskeletal: Positive for back pain and joint pain.   All other systems reviewed and are negative.      Vitals:  Visit Vitals  BP 132/70   Temp 97.3 ??F (36.3 ??C)   Ht 5\' 6"  (1.676 m)    Wt 162 lb (73.5 kg)   BMI 26.15 kg/m??       Physical Exam:  Physical Exam   Constitutional: She is oriented to person, place, and time. She appears well-developed and well-nourished.   HENT:   Head: Normocephalic and atraumatic.   Neck: Normal range of motion. Neck supple.   Cardiovascular: Normal rate, regular rhythm and normal heart sounds.   Pulmonary/Chest: Effort normal and breath sounds normal. She has no wheezes.   Musculoskeletal: Normal range of motion. She exhibits tenderness.   Mild lower back tenderness to palpation on right  Ambulatory  No CVAT   Neurological: She is alert and oriented to person, place, and time.   Skin: Skin is warm and dry.   Psychiatric: She has a normal mood and affect. Her behavior is normal. Judgment and thought content normal.       Assessment/Plan:   Diagnoses and all orders for this visit:    1. Encounter for Medicare annual wellness exam  -     MAM MAMMO BI SCREENING INCL CAD; Future  RX for Shingrix  2. Vitamin D deficiency    3. Prediabetes  Watch sweets in diet  4. HTN (hypertension), benign  Monitor on present meds  5. Acquired hypothyroidism   monitor  6. Upper respiratory tract infection, unspecified type   increase fluids, Mucinex prn, RX for Ceftin  7. Osteoporosis, unspecified osteoporosis type, unspecified pathological fracture presence  Continue treatment with Rheum  8. Personal history of colonic polyps  -     REFERRAL FOR COLONOSCOPY  Requests Dr. Kathreen Devoid who has seen her husband  22. Screening mammogram, encounter for  -     MAM MAMMO BI SCREENING INCL CAD; Future    10. Rheumatoid arthritis, involving unspecified site, unspecified rheumatoid factor presence ([place])  Follow with Dr. Debbra Riding  11. Chronic right-sided low back pain without sciatica  Handout on treatment options and exercises to do and try topical Menthol  Other orders  -     varicella-zoster recombinant, PF, (SHINGRIX, PF,) 50 mcg/0.5 mL susr injection; 0.5 mL by IntraMUSCular route once for 1 dose.   -     cefUROXime (CEFTIN) 250 mg tablet; Take 1 Tab by mouth two (2) times a day.        [name], MD      This is the Subsequent Medicare Annual Wellness Exam, performed 12 months or more after the Initial AWV or the last Subsequent AWV    I have reviewed the patient's  medical history in detail and updated the computerized patient record.     History     Past Medical History:   Diagnosis Date   ??? Chronic obstructive pulmonary disease ([place])    ??? GERD (gastroesophageal reflux disease)    ??? H/O mammogram 07/28/2016    mammogram and Ultrasound of right breaset- benign finding- [facility]    ??? Hearing loss of both ears    ??? HTN (hypertension), benign 01/30/2014   ??? Hypothyroid     on med for control   ??? Osteoarthritis    ??? Osteoporosis    ??? Rheumatoid arthritis(714.0)     Dr Debbra Riding follows, on methotrexate for control   ??? Rheumatoid arthritis(714.0) 01/30/2014   ??? Sinusitis    ??? Unspecified hypothyroidism 01/30/2014   ??? Vitamin D deficiency       Past Surgical History:   Procedure Laterality Date   ??? HX CARPAL TUNNEL RELEASE Bilateral    ??? HX COLONOSCOPY      colon polyps   ??? 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 HX HEMORRHOIDECTOMY     ??? HX OTHER SURGICAL  09/2012    sinus surgery-Dr. Mickle Mallory   ??? 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 HX TONSILLECTOMY  77 yrs old     Current Outpatient Medications   Medication Sig Dispense Refill   ??? varicella-zoster recombinant, PF, (SHINGRIX, PF,) 50 mcg/0.5 mL susr injection 0.5 mL by IntraMUSCular route once for 1 dose. 0.5 mL 1   ??? cefUROXime (CEFTIN) 250 mg tablet Take 1 Tab by mouth two (2) times a day. 20 Tab 0   ??? fluticasone (FLONASE) 50 mcg/actuation nasal spray 2 Sprays by Both Nostrils route daily. 1 Bottle 5   ??? mupirocin (BACTROBAN) 2 % ointment Apply  to affected area daily. 15 g 1   ??? losartan (COZAAR) 100 mg tablet TAKE 1 TABLET BY MOUTH DAILY 30 Tab 11   ??? levothyroxine (SYNTHROID) 100 mcg tablet Take 1 Tab by mouth Daily  (before breakfast). 30 Tab 11   ??? gabapentin (NEURONTIN) 100 mg capsule Take  by mouth three (3) times daily.     ??? denosumab (PROLIA) 60 mg/mL injection 60 mg by SubCUTAneous route.     ??? bacitracin-neomycin-polymyxin b-hydrocortisone (CORTISPORIN) 1 % ointment Use samll amount 3-4 times a day. 15 g 5   ??? mupirocin (BACTROBAN) 2 % ointment Apply  to affected area daily. 22 g 5   ??? Cholecalciferol, Vitamin D3, 50,000 unit cap Take 1 Cap by mouth every Monday.     ??? methotrexate, PF, 25 mg/mL injection 25 mg every seven (7) days.     ??? nicotinic acid (NIACIN) 500 mg tablet Take 500 mg by mouth Daily (before breakfast). 2 po everyday.     ??? calcium-cholecalciferol, d3, (CALCIUM 600 + D) 600-125 mg-unit tab Take 2 Tabs by mouth.     ??? multivitamin (ONE A DAY) tablet Take 1 Tab by mouth daily.     ??? folic acid (FOLVITE) 1 mg tablet Take  by mouth daily.       No Known Allergies  Family History   Problem Relation Age of Onset   ??? Arthritis-osteo Mother    ??? Alzheimer Father    ??? Breast Cancer Neg Hx      Social History     Tobacco Use   ??? Smoking status: Former Smoker  Packs/day: 0.50     Years: 43.00     Pack years: 21.50   ??? Smokeless tobacco: Never Used   ??? Tobacco comment: not regular when first started- 1pk/week   Substance Use Topics   ??? Alcohol use: No     Alcohol/week: 0.0 oz     Patient Active Problem List   Diagnosis Code   ??? Rheumatoid arthritis ([place]) M06.9   ??? Hypothyroidism E03.9   ??? Vitamin D deficiency E55.9   ??? Osteoporosis M81.0   ??? Gastroesophageal reflux disease without esophagitis K21.9   ??? HTN (hypertension), benign I10   ??? Environmental allergies Z91.09   ??? 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 Prediabetes R73.03       Depression Risk Factor Screening:     3 most recent PHQ Screens 12/02/2017   Little interest or pleasure in doing things Not at all   Feeling down, depressed, irritable, or hopeless Several days   Total Score PHQ 2 1      Alcohol Risk Factor Screening:   You do not drink alcohol or very rarely.    Functional Ability and Level of Safety:   Hearing Loss  The patient needs further evaluation.needs to be wearing aides    Activities of Daily Living  The home contains: handrails and grab bars  Patient does total self care    Fall Risk  Fall Risk Assessment, last 12 mths 12/02/2017   Able to walk? Yes   Fall in past 12 months? No       Abuse Screen  Patient is not abused    Cognitive Screening   Evaluation of Cognitive Function:  Has your family/caregiver stated any concerns about your memory: no  Normal    Patient Care Team   Patient Care Team:  [name], MD as PCP - General (Internal Medicine)    Assessment/Plan   Education and counseling provided:  Are appropriate based on today's review and evaluation  Influenza Vaccine  Screening Mammography  Colorectal cancer screening tests  Screening for glaucoma    Diagnoses and all orders for this visit:    1. Encounter for Medicare annual wellness exam  -     MAM MAMMO BI SCREENING INCL CAD; Future    2. Vitamin D deficiency    3. Prediabetes    4. HTN (hypertension), benign    5. Acquired hypothyroidism    6. Upper respiratory tract infection, unspecified type    7. Osteoporosis, unspecified osteoporosis type, unspecified pathological fracture presence    8. Personal history of colonic polyps  -     REFERRAL FOR COLONOSCOPY    9. Screening mammogram, encounter for  -     MAM MAMMO BI SCREENING INCL CAD; Future    10. Rheumatoid arthritis, involving unspecified site, unspecified rheumatoid factor presence ([place])    11. Chronic right-sided low back pain without sciatica    Other orders  -     varicella-zoster recombinant, PF, (SHINGRIX, PF,) 50 mcg/0.5 mL susr injection; 0.5 mL by IntraMUSCular route once for 1 dose.  -     cefUROXime (CEFTIN) 250 mg tablet; Take 1 Tab by mouth two (2) times a day.        Health Maintenance Due   Topic Date Due    ??? DTaP/Tdap/Td series (1 - Tdap) 08/07/1960   ??? Shingrix Vaccine Age 49> (1 of 2) 08/07/1989   ??? GLAUCOMA SCREENING Q2Y  08/07/2004

## 2017-12-18 NOTE — Progress Notes (Signed)
Referral received for routine colonoscopy- screening or surveillance only.  Chart reviewed by me on December 18, 2017. GI notes scanned into media indicate h/o tubular adenomas with recommended recall of jan 2018    Pt found to be acceptable candidate for direct scheduling if they are interested with the following special instructions (if any):

## 2017-12-23 NOTE — Addendum Note (Signed)
Addendum Note by Azalia Bilis, MD at 12/23/17 1048                Author: Azalia Bilis, MD  Service: --  Author Type: Physician       Filed: 12/28/17 1023  Encounter Date: 12/23/2017  Status: Signed          Editor: Azalia Bilis, MD (Physician)          Addended by: Azalia Bilis on: 12/28/2017 10:23 AM    Modules accepted: Orders

## 2017-12-23 NOTE — Addendum Note (Signed)
Addended by: Azalia Bilis on: 12/28/2017 10:23 AM     Modules accepted: Orders

## 2017-12-29 ENCOUNTER — Inpatient Hospital Stay: Admit: 2017-12-29 | Payer: MEDICARE | Attending: Internal Medicine | Primary: Internal Medicine

## 2017-12-29 DIAGNOSIS — Z0000 Encounter for general adult medical examination without abnormal findings: Secondary | ICD-10-CM

## 2017-12-29 NOTE — Progress Notes (Signed)
Mammogram negative  Copy to pt

## 2018-01-20 NOTE — Interval H&P Note (Signed)
Patient verified name, DOB, and procedure.    Type: 1a; abbreviated assessment per anesthesia guidelines  Labs per surgeon: none  Labs per anesthesia: nond    Instructed pt that they will be notified via office/GI LAB for time of arrival to GI lab.     Follow diet and prep instructions per office including NPO status.  If patient has NOT received instructions from office patient is advised to call surgeon office, verbalizes understanding.    Bath or shower the night before and the am of surgery with non-mositurizing soap. No lotions, oils, powders, cologne on skin. No make up, eye make up or jewelry. Wear loose fitting comfortable, clean clothing.     Must have adult present in building the entire time .     Medications for the day of procedure synthroid, patient to hold none    The following discharge instructions reviewed with patient: medication given during procedure may cause drowsiness for several hours, therefore, do not drive or operate machinery for remainder of the day. You may not drink alcohol on the day of your procedure, please resume regular diet and activity unless otherwise directed. You may experience abdominal distention for several hours that is relieved by the passage of gas. Contact your physician if you have any of the following: fever or chills, severe abdominal pain or excessive amount of bleeding or a large amount when having a bowel movement. Occasional specks of blood with bowel movement would not be unusual.

## 2018-01-21 ENCOUNTER — Inpatient Hospital Stay: Primary: Internal Medicine

## 2018-01-21 NOTE — Other (Signed)
Patient verified name, DOB, and procedure.    Type: 1a; abbreviated assessment per anesthesia guidelines  Labs per surgeon: none  Labs per anesthesia: nond    Instructed pt that they will be notified via office/GI LAB for time of arrival to GI lab.     Follow diet and prep instructions per office including NPO status.  If patient has NOT received instructions from office patient is advised to call surgeon office, verbalizes understanding.    Bath or shower the night before and the am of surgery with non-mositurizing soap. No lotions, oils, powders, cologne on skin. No make up, eye make up or jewelry. Wear loose fitting comfortable, clean clothing.     Must have adult present in building the entire time .     Medications for the day of procedure synthroid, patient to hold none    The following discharge instructions reviewed with patient: medication given during procedure may cause drowsiness for several hours, therefore, do not drive or operate machinery for remainder of the day. You may not drink alcohol on the day of your procedure, please resume regular diet and activity unless otherwise directed. You may experience abdominal distention for several hours that is relieved by the passage of gas. Contact your physician if you have any of the following: fever or chills, severe abdominal pain or excessive amount of bleeding or a large amount when having a bowel movement. Occasional specks of blood with bowel movement would not be unusual.

## 2018-01-27 ENCOUNTER — Inpatient Hospital Stay: Payer: MEDICARE

## 2018-01-27 MED ORDER — LACTATED RINGERS IV
INTRAVENOUS | Status: DC
Start: 2018-01-27 — End: 2018-01-27
  Administered 2018-01-27: 13:00:00 via INTRAVENOUS

## 2018-01-27 MED ORDER — FAMOTIDINE (PF) 20 MG/2 ML IV
20 mg/2 mL | Freq: Once | INTRAVENOUS | Status: DC
Start: 2018-01-27 — End: 2018-01-27

## 2018-01-27 MED ORDER — LACTATED RINGERS IV
INTRAVENOUS | Status: DC
Start: 2018-01-27 — End: 2018-01-27
  Administered 2018-01-27: 14:00:00 via INTRAVENOUS

## 2018-01-27 MED ORDER — SODIUM CHLORIDE 0.9 % IJ SYRG
Freq: Three times a day (TID) | INTRAMUSCULAR | Status: DC
Start: 2018-01-27 — End: 2018-01-27

## 2018-01-27 MED ORDER — PROPOFOL 10 MG/ML IV EMUL
10 mg/mL | INTRAVENOUS | Status: DC | PRN
Start: 2018-01-27 — End: 2018-01-27
  Administered 2018-01-27: 14:00:00 via INTRAVENOUS

## 2018-01-27 MED ORDER — LIDOCAINE (PF) 20 MG/ML (2 %) IJ SOLN
20 mg/mL (2 %) | INTRAMUSCULAR | Status: DC | PRN
Start: 2018-01-27 — End: 2018-01-27
  Administered 2018-01-27: 14:00:00 via INTRAVENOUS

## 2018-01-27 MED ORDER — SODIUM CHLORIDE 0.9 % IV
INTRAVENOUS | Status: DC
Start: 2018-01-27 — End: 2018-01-27

## 2018-01-27 MED ORDER — SODIUM CHLORIDE 0.9 % IJ SYRG
INTRAMUSCULAR | Status: DC | PRN
Start: 2018-01-27 — End: 2018-01-27

## 2018-01-27 MED ORDER — MUPIROCIN 2 % OINTMENT
2 % | Freq: Every day | CUTANEOUS | 1 refills | Status: DC
Start: 2018-01-27 — End: 2019-01-04

## 2018-01-27 MED ORDER — PROPOFOL 10 MG/ML IV EMUL
10 mg/mL | INTRAVENOUS | Status: DC | PRN
Start: 2018-01-27 — End: 2018-01-27
  Administered 2018-01-27 (×2): via INTRAVENOUS

## 2018-01-27 MED FILL — SODIUM CHLORIDE 0.9 % IV: INTRAVENOUS | Qty: 1000

## 2018-01-27 MED FILL — PROPOFOL 10 MG/ML IV EMUL: 10 mg/mL | INTRAVENOUS | Qty: 214.11

## 2018-01-27 MED FILL — XYLOCAINE-MPF 20 MG/ML (2 %) INJECTION SOLUTION: 20 mg/mL (2 %) | INTRAMUSCULAR | Qty: 40

## 2018-01-27 MED FILL — LACTATED RINGERS IV: INTRAVENOUS | Qty: 1000

## 2018-01-27 NOTE — Anesthesia Post-Procedure Evaluation (Signed)
Procedure(s):  COLONOSCOPY/BMI 26.    total IV anesthesia    Anesthesia Post Evaluation      Multimodal analgesia: multimodal analgesia not used between 6 hours prior to anesthesia start to PACU discharge  Patient location during evaluation: bedside  Patient participation: complete - patient participated  Level of consciousness: awake  Pain score: 1  Pain management: adequate  Airway patency: patent  Anesthetic complications: no  Cardiovascular status: acceptable  Respiratory status: acceptable  Hydration status: acceptable  Comments: Pt doing well.   Post anesthesia nausea and vomiting:  none      Vitals Value Taken Time   BP 137/65 01/27/2018 10:42 AM   Temp     Pulse 84 01/27/2018 10:42 AM   Resp 16 01/27/2018 10:42 AM   SpO2 93 % 01/27/2018 10:42 AM

## 2018-01-27 NOTE — Procedures (Signed)
Procedure in Detail:  Informed consent was obtained for the procedure.  The patient was placed in the left lateral decubitus position and sedation was induced by anesthesia.        The scope was inserted into the rectum and advanced under direct vision to the cecum, which was identified by the ileocecal valve and appendiceal orifice.  The quality of the colonic preparation was adequate.  A careful inspection was made as the colonoscope was withdrawn, including a retroflexed view of the rectum; findings and interventions are described below.  Appropriate photodocumentation was obtained.    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       Complications: None; patient tolerated the procedure well.\\    EBL - none    Recommendations:    - no further screening based on age (repeat would be at age 61)     Signed By: Azalia Bilis, MD                        January 27, 2018

## 2018-01-27 NOTE — Anesthesia Pre-Procedure Evaluation (Signed)
Relevant Problems   No relevant active problems       Anesthetic History               Review of Systems / Medical History  Patient summary reviewed and pertinent labs reviewed    Pulmonary    COPD: mild      Smoker (Former)         Neuro/Psych   Within defined limits           Cardiovascular    Hypertension: well controlled              Exercise tolerance: >4 METS  Comments: Denies CP, SOB or changes in functional status   GI/Hepatic/Renal     GERD: well controlled           Endo/Other      Hypothyroidism: well controlled  Arthritis (RA)     Other Findings   Comments: HOH           Physical Exam    Airway  Mallampati: II  TM Distance: 4 - 6 cm  Neck ROM: normal range of motion   Mouth opening: Normal     Cardiovascular    Rhythm: regular  Rate: normal         Dental      Comments: Missing some teeth   Pulmonary  Breath sounds clear to auscultation               Abdominal  GI exam deferred       Other Findings            Anesthetic Plan    ASA: 3  Anesthesia type: total IV anesthesia          Induction: Intravenous  Anesthetic plan and risks discussed with: Patient and Spouse

## 2018-01-27 NOTE — H&P (Addendum)
History and Physical      Patient: Audrey Snow    Physician: Azalia Bilis, MD    Referring Physician: [name], MD    Chief Complaint: For colonoscopy    History of Present Illness: Pt presents for colonoscopy.  Has h/o adenoma on previous exams (GI) with recommend repeat in 2018. She is uncertain when her last exam occurred, or how man prior exams she's had.    History:  Past Medical History:   Diagnosis Date   ??? Autoimmune disease ([place])     RA   ??? Chronic obstructive pulmonary disease ([place])     very mild per pt.   ??? GERD (gastroesophageal reflux disease)     controlled w/med   ??? H/O mammogram 07/28/2016    mammogram and Ultrasound of right breaset- benign finding- [facility]    ??? Hearing loss of both ears    ??? HTN (hypertension), benign 01/30/2014    controlled w/med   ??? Hypothyroid     on med for control   ??? Menopause    ??? Osteoarthritis    ??? Osteoporosis    ??? 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 Debbra Riding. Methotrexate Rx   ??? Sinusitis    ??? Unspecified hypothyroidism 01/30/2014   ??? Vitamin D deficiency      Past Surgical History:   Procedure Laterality Date   ??? HX CARPAL TUNNEL RELEASE Bilateral    ??? HX COLONOSCOPY      colon polyps-adenomatous, has seen Dr. Kristopher Glee in past   ??? 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 HX HEMORRHOIDECTOMY     ??? HX OTHER SURGICAL  09/2012    sinus surgery-Dr. Mickle Mallory   ??? 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 HX TONSILLECTOMY  77 yrs old      Social History     Socioeconomic History   ??? Marital status: MARRIED     Spouse name: Not on file   ??? Number of children: Not on file   ??? Years of education: Not on file   ??? Highest education level: Not on file   Tobacco Use   ??? Smoking status: Former Smoker     Packs/day: 0.50     Years: 43.00     Pack years: 21.50   ??? Smokeless tobacco: Never Used   ??? Tobacco comment: not regular when first started- 1pk/week   Substance and Sexual Activity   ??? Alcohol use: No      Alcohol/week: 0.0 standard drinks   ??? Drug use: No      Family History   Problem Relation Age of Onset   ??? Arthritis-osteo Mother    ??? Alzheimer Father    ??? Breast Cancer Neg Hx        Medications:   Prior to Admission medications    Medication Sig Start Date End Date Taking? Authorizing Provider   fluticasone (FLONASE) 50 mcg/actuation nasal spray 2 Sprays by Both Nostrils route daily. 06/23/17  Yes Selinda Orion, NP   mupirocin (BACTROBAN) 2 % ointment Apply  to affected area daily. 06/03/17  Yes [name], MD   losartan (COZAAR) 100 mg tablet TAKE 1 TABLET BY MOUTH DAILY 02/19/17  Yes [name], MD   levothyroxine (SYNTHROID) 100 mcg tablet Take 1 Tab by mouth Daily (before breakfast).  Patient taking differently: Take 100 mcg by mouth  Daily (before breakfast). Per anesthesia protocol:instructed to take am of surgery. 02/13/17  Yes [name], MD   bacitracin-neomycin-polymyxin b-hydrocortisone (CORTISPORIN) 1 % ointment Use samll amount 3-4 times a day. 02/12/16  Yes [name], MD   mupirocin (BACTROBAN) 2 % ointment Apply  to affected area daily. 02/12/16  Yes [name], MD   Cholecalciferol, Vitamin D3, 50,000 unit cap Take 1 Cap by mouth every Monday.   Yes Provider, Historical   methotrexate, PF, 25 mg/mL injection 25 mg every seven (7) days.   Yes Provider, Historical   nicotinic acid (NIACIN) 500 mg tablet Take 500 mg by mouth Daily (before breakfast). 2 po everyday.   Yes Provider, Historical   calcium-cholecalciferol, d3, (CALCIUM 600 + D) 600-125 mg-unit tab Take 2 Tabs by mouth.   Yes Provider, Historical   multivitamin (ONE A DAY) tablet Take 1 Tab by mouth daily.   Yes Provider, Historical   folic acid (FOLVITE) 1 mg tablet Take  by mouth daily.   Yes Provider, Historical   cefUROXime (CEFTIN) 250 mg tablet Take 1 Tab by mouth two (2) times a day. 12/02/17   [name], MD    denosumab (PROLIA) 60 mg/mL injection 60 mg by SubCUTAneous route.    Provider, Historical       Allergies: No Known Allergies    Physical Exam:     Vital Signs:   Visit Vitals  BP 162/79   Pulse 62   Temp 98.1 ??F (36.7 ??C)   Resp 18   Wt 163 lb 6 oz (74.1 kg)   SpO2 93%   BMI 26.37 kg/m??     .    General: no distress      Heart: regular   Lungs: unlabored   Abdominal: soft   Neurological: Grossly normal        Findings/Diagnosis: Encounter for colorectal cancer screening due to personal history of adenomatous polyp(s)     Plan of Care/Planned Procedure: Colonoscopy, possible polypectomy.  Pt/designee has reviewed the colonoscopy information sheet.  Any questions have been discussed.  They agree to proceed.      Signed:  Azalia Bilis, MD   01/27/2018

## 2018-01-27 NOTE — H&P (Signed)
History and Physical      Patient: Audrey Snow    Physician: Azalia Bilis, MD    Referring Physician: [name], MD    Chief Complaint: For colonoscopy    History of Present Illness: Pt presents for colonoscopy.  Has h/o adenoma on previous exams (GI) with recommend repeat in 2018. She is uncertain when her last exam occurred, or how man prior exams she's had.    History:  Past Medical History:   Diagnosis Date   ??? Autoimmune disease ([place])     RA   ??? Chronic obstructive pulmonary disease ([place])     very mild per pt.   ??? GERD (gastroesophageal reflux disease)     controlled w/med   ??? H/O mammogram 07/28/2016    mammogram and Ultrasound of right breaset- benign finding- [facility]    ??? Hearing loss of both ears    ??? HTN (hypertension), benign 01/30/2014    controlled w/med   ??? Hypothyroid     on med for control   ??? Menopause    ??? Osteoarthritis    ??? Osteoporosis    ??? 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 Debbra Riding. Methotrexate Rx   ??? Sinusitis    ??? Unspecified hypothyroidism 01/30/2014   ??? Vitamin D deficiency      Past Surgical History:   Procedure Laterality Date   ??? HX CARPAL TUNNEL RELEASE Bilateral    ??? HX COLONOSCOPY      colon polyps-adenomatous, has seen Dr. Kristopher Glee in past   ??? 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 HX HEMORRHOIDECTOMY     ??? HX OTHER SURGICAL  09/2012    sinus surgery-Dr. Mickle Mallory   ??? 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 HX TONSILLECTOMY  77 yrs old      Social History     Socioeconomic History   ??? Marital status: MARRIED     Spouse name: Not on file   ??? Number of children: Not on file   ??? Years of education: Not on file   ??? Highest education level: Not on file   Tobacco Use   ??? Smoking status: Former Smoker     Packs/day: 0.50     Years: 43.00     Pack years: 21.50   ??? Smokeless tobacco: Never Used   ??? Tobacco comment: not regular when first started- 1pk/week   Substance and Sexual Activity   ??? Alcohol use: No      Alcohol/week: 0.0 standard drinks   ??? Drug use: No      Family History   Problem Relation Age of Onset   ??? Arthritis-osteo Mother    ??? Alzheimer Father    ??? Breast Cancer Neg Hx        Medications:   Prior to Admission medications    Medication Sig Start Date End Date Taking? Authorizing Provider   fluticasone (FLONASE) 50 mcg/actuation nasal spray 2 Sprays by Both Nostrils route daily. 06/23/17  Yes Selinda Orion, NP   mupirocin (BACTROBAN) 2 % ointment Apply  to affected area daily. 06/03/17  Yes [name], MD   losartan (COZAAR) 100 mg tablet TAKE 1 TABLET BY MOUTH DAILY 02/19/17  Yes [name], MD   levothyroxine (SYNTHROID) 100 mcg tablet Take 1 Tab by mouth Daily (before breakfast).  Patient taking differently: Take 100 mcg by mouth  Daily (before breakfast). Per anesthesia protocol:instructed to take am of surgery. 02/13/17  Yes [name], MD   bacitracin-neomycin-polymyxin b-hydrocortisone (CORTISPORIN) 1 % ointment Use samll amount 3-4 times a day. 02/12/16  Yes [name], MD   mupirocin (BACTROBAN) 2 % ointment Apply  to affected area daily. 02/12/16  Yes [name], MD   Cholecalciferol, Vitamin D3, 50,000 unit cap Take 1 Cap by mouth every Monday.   Yes Provider, Historical   methotrexate, PF, 25 mg/mL injection 25 mg every seven (7) days.   Yes Provider, Historical   nicotinic acid (NIACIN) 500 mg tablet Take 500 mg by mouth Daily (before breakfast). 2 po everyday.   Yes Provider, Historical   calcium-cholecalciferol, d3, (CALCIUM 600 + D) 600-125 mg-unit tab Take 2 Tabs by mouth.   Yes Provider, Historical   multivitamin (ONE A DAY) tablet Take 1 Tab by mouth daily.   Yes Provider, Historical   folic acid (FOLVITE) 1 mg tablet Take  by mouth daily.   Yes Provider, Historical   cefUROXime (CEFTIN) 250 mg tablet Take 1 Tab by mouth two (2) times a day. 12/02/17   [name], MD   denosumab (PROLIA) 60 mg/mL injection 60 mg by SubCUTAneous route.     Provider, Historical       Allergies: No Known Allergies    Physical Exam:     Vital Signs:   Visit Vitals  BP 162/79   Pulse 62   Temp 98.1 ??F (36.7 ??C)   Resp 18   Wt 163 lb 6 oz (74.1 kg)   SpO2 93%   BMI 26.37 kg/m??     .    General: no distress      Heart: regular   Lungs: unlabored   Abdominal: soft   Neurological: Grossly normal        Findings/Diagnosis: Encounter for colorectal cancer screening due to personal history of adenomatous polyp(s)     Plan of Care/Planned Procedure: Colonoscopy, possible polypectomy.  Pt/designee has reviewed the colonoscopy information sheet.  Any questions have been discussed.  They agree to proceed.      Signed:  Azalia Bilis, MD   01/27/2018

## 2018-01-27 NOTE — Procedures (Signed)
Procedure in Detail:  Informed consent was obtained for the procedure.  The patient was placed in the left lateral decubitus position and sedation was induced by anesthesia.        The scope was inserted into the rectum and advanced under direct vision to the cecum, which was identified by the ileocecal valve and appendiceal orifice.  The quality of the colonic preparation was adequate.  A careful inspection was made as the colonoscope was withdrawn, including a retroflexed view of the rectum; findings and interventions are described below.  Appropriate photodocumentation was obtained.    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       Complications: None; patient tolerated the procedure well.\\    EBL - none    Recommendations:    - no further screening based on age (repeat would be at age 81)     Signed By: Azalia Bilis, MD                        January 27, 2018

## 2018-02-11 MED ORDER — LEVOTHYROXINE 100 MCG TAB
100 mcg | ORAL_TABLET | Freq: Every day | ORAL | 11 refills | Status: DC
Start: 2018-02-11 — End: 2019-02-09

## 2018-03-08 MED ORDER — LOSARTAN 100 MG TAB
100 mg | ORAL_TABLET | ORAL | 11 refills | Status: DC
Start: 2018-03-08 — End: 2018-09-27

## 2018-06-02 ENCOUNTER — Encounter: Attending: Internal Medicine | Primary: Internal Medicine

## 2018-06-25 ENCOUNTER — Ambulatory Visit: Attending: Internal Medicine | Primary: Internal Medicine

## 2018-06-25 ENCOUNTER — Ambulatory Visit: Admit: 2018-06-25 | Discharge: 2018-06-25 | Payer: MEDICARE | Attending: Internal Medicine | Primary: Internal Medicine

## 2018-06-25 DIAGNOSIS — M069 Rheumatoid arthritis, unspecified: Secondary | ICD-10-CM

## 2018-06-25 MED ORDER — FLUTICASONE 50 MCG/ACTUATION NASAL SPRAY, SUSP
50 mcg/actuation | Freq: Every day | NASAL | 5 refills | Status: DC
Start: 2018-06-25 — End: 2019-04-05

## 2018-06-25 MED ORDER — BACITRACIN-POLYMYXIN-NEOSP HC 1 % OINTMENT
1 % | CUTANEOUS | 5 refills | Status: DC
Start: 2018-06-25 — End: 2020-06-20

## 2018-06-25 NOTE — Progress Notes (Signed)
Progress Notes by [name], MD at 06/25/18 1120                Author: [name], MD  Service: --  Author Type: Physician       Filed: 06/27/18 1305  Encounter Date: 06/25/2018  Status: Signed          Editor: [name], MD (Physician)                       HPI: Audrey Snow (DOB: 02-15-1941 )   Has a raised area on her left lower leg that is scaly      Has area of redness and itching on her back      Still with chronic issues with her right ear due to previous tube placement      Problem List:     Patient Active Problem List        Diagnosis  Code         ?  Rheumatoid arthritis ([place])  M06.9     ?  Hypothyroidism  E03.9     ?  Vitamin D deficiency  E55.9     ?  Osteoporosis  M81.0     ?  Gastroesophageal reflux disease without esophagitis  K21.9     ?  HTN (hypertension), benign  I10     ?  Environmental allergies  Z91.09     ?  Chronic right-sided low back pain without sciatica  M54.5, G89.29     ?  Prediabetes  R73.03     ?  Chronic pain of left knee  M25.562, G89.29         ?  Pre-ulcerative corn or callous  L84           History:     Past Medical History:        Diagnosis  Date         ?  Autoimmune disease ([place])            RA         ?  Chronic obstructive pulmonary disease ([place])            very mild per pt.         ?  Chronic pain of left knee  06/25/2018     ?  GERD (gastroesophageal reflux disease)            controlled w/med         ?  H/O mammogram  07/28/2016          mammogram and Ultrasound of right breaset- benign finding- [facility]          ?  Hearing loss of both ears       ?  HTN (hypertension), benign  01/30/2014          controlled w/med         ?  Hypothyroid            on med for control         ?  Menopause       ?  Osteoarthritis       ?  Osteoporosis       ?  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 Debbra Riding. Methotrexate Rx         ?  Sinusitis       ?  Unspecified hypothyroidism  01/30/2014         ?  Vitamin D deficiency              Allergies:   No Known Allergies      Current Medications:     Current Outpatient Medications          Medication  Sig  Dispense  Refill           ?  fluticasone propionate (FLONASE) 50 mcg/actuation nasal spray  2 Sprays by Both Nostrils route daily.  1 Bottle  5     ?  bacitracin-neomycin-polymyxin b-hydrocortisone (CORTISPORIN) 1 % ointment  Use samll amount 3-4 times a day.  15 g  5     ?  losartan (COZAAR) 100 mg tablet  TAKE 1 TABLET BY MOUTH DAILY  30 Tab  11     ?  levothyroxine (SYNTHROID) 100 mcg tablet  Take 1 Tab by mouth Daily (before breakfast).  30 Tab  11     ?  mupirocin (BACTROBAN) 2 % ointment  Apply  to affected area daily.  15 g  1     ?  denosumab (PROLIA) 60 mg/mL injection  60 mg by SubCUTAneous route.         ?  Cholecalciferol, Vitamin D3, 50,000 unit cap  Take 1 Cap by mouth every Monday.         ?  methotrexate, PF, 25 mg/mL injection  25 mg every seven (7) days.         ?  nicotinic acid (NIACIN) 500 mg tablet  Take 500 mg by mouth Daily (before breakfast). 2 po everyday.         ?  calcium-cholecalciferol, d3, (CALCIUM 600 + D) 600-125 mg-unit tab  Take 2 Tabs by mouth.         ?  multivitamin (ONE A DAY) tablet  Take 1 Tab by mouth daily.         ?  folic acid (FOLVITE) 1 mg tablet  Take  by mouth daily.               ?  cefUROXime (CEFTIN) 250 mg tablet  Take 1 Tab by mouth two (2) times a day.  20 Tab  0           Review of Systems:   Review of Systems    Skin: Positive for itching and rash .    All other systems reviewed and are negative.         Vitals:   Visit Vitals      BP  120/80     Ht  '5\' 6"'  (1.676 m)     Wt  161 lb (73 kg)        BMI  25.99 kg/m??           Physical Exam:   Physical Exam   Vitals signs reviewed.   Constitutional:        Appearance: Normal appearance.   HENT :       Head: Normocephalic and atraumatic.      Left Ear: Tympanic membrane normal.      Ears:      Comments: Right TM with perforated area  No drainage noted  Cardiovascular:       Rate and Rhythm: Normal  rate and regular rhythm.      Pulses: Normal pulses.      Heart sounds: Normal heart sounds.    Pulmonary:  Effort: Pulmonary effort is normal.      Breath sounds: Normal breath sounds.    Skin:      General: Skin is warm and dry.      Comments: Dryness of left upper back noted  No rash noted  Left lower leg has a seb keratosis    Neurological :       Mental Status: She is alert and oriented to person, place, and time.    Psychiatric:         Mood and Affect: Mood normal.         Behavior: Behavior normal.         Thought Content: Thought content normal.         Judgment: Judgment normal.             Assessment/Plan:    Diagnoses and all orders for this visit:      1. Rheumatoid arthritis, involving unspecified site, unspecified rheumatoid factor presence (HCC)   -     CBC WITH AUTOMATED DIFF   -     METABOLIC PANEL, COMPREHENSIVE   -     C REACTIVE PROTEIN, QT   -     SED RATE (ESR)   -     HEMOGLOBIN A1C WITH EAG   Follow with Rheumatology   2. HTN (hypertension), benign   -     CBC WITH AUTOMATED DIFF   -     METABOLIC PANEL, COMPREHENSIVE   -     C REACTIVE PROTEIN, QT   -     SED RATE (ESR)   -     HEMOGLOBIN A1C WITH EAG   monitor   3. Prediabetes   -     CBC WITH AUTOMATED DIFF   -     METABOLIC PANEL, COMPREHENSIVE   -     C REACTIVE PROTEIN, QT   -     SED RATE (ESR)   -     HEMOGLOBIN A1C WITH EAG      4. Osteoporosis, unspecified osteoporosis type, unspecified pathological fracture presence   -     CBC WITH AUTOMATED DIFF   -     METABOLIC PANEL, COMPREHENSIVE   -     C REACTIVE PROTEIN, QT   -     SED RATE (ESR)   -     HEMOGLOBIN A1C WITH EAG      5. Chronic bilateral low back pain without sciatica   -     CBC WITH AUTOMATED DIFF   -     METABOLIC PANEL, COMPREHENSIVE   -     C REACTIVE PROTEIN, QT   -     SED RATE (ESR)   -     HEMOGLOBIN A1C WITH EAG      6. Elevated blood sugar   -     CBC WITH AUTOMATED DIFF   -     METABOLIC PANEL, COMPREHENSIVE   -     C REACTIVE PROTEIN, QT   -     SED RATE  (ESR)   -     HEMOGLOBIN A1C WITH EAG   Watch diet   7. Inflamed seborrheic keratosis   -     DESTRUC BENIGN LESION, UP TO 14 LESIONS   Treated with LN therapy   8. Pre-ulcerative corn or callous   -     REFERRAL TO PODIATRY   Will refer to podiatry for treatment- using corn pad now  9. Acquired hypothyroidism   Need to update lab   10. Chronic pain of left knee   Seeing Dr Debbra Riding   Other orders   -     fluticasone propionate (FLONASE) 50 mcg/actuation nasal spray; 2 Sprays by Both Nostrils route daily.   -     bacitracin-neomycin-polymyxin b-hydrocortisone (CORTISPORIN) 1 % ointment; Use samll amount 3-4 times a day.         11.  Dermatitis of back- try 1% Hydrocortizone cream and Aloe vera gel topically daily            [name], MD

## 2018-06-25 NOTE — Progress Notes (Signed)
Labs are stable  Continue all medications  Blood sugar mildly elevated- still with mild increase in blood sugar so watch sweets in diet  Copy to pt via mail

## 2018-06-25 NOTE — Progress Notes (Signed)
HPI: Audrey Snow (DOB: Jan 21, 1941)  Has a raised area on her left lower leg that is scaly    Has area of redness and itching on her back    Still with chronic issues with her right ear due to previous tube placement    Problem List:  Patient Active Problem List   Diagnosis Code   ??? Rheumatoid arthritis ([place]) M06.9   ??? Hypothyroidism E03.9   ??? Vitamin D deficiency E55.9   ??? Osteoporosis M81.0   ??? Gastroesophageal reflux disease without esophagitis K21.9   ??? HTN (hypertension), benign I10   ??? Environmental allergies Z91.09   ??? 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 Prediabetes R73.03   ??? Chronic pain of left knee M25.562, G89.29   ??? Pre-ulcerative corn or callous L84       History:  Past Medical History:   Diagnosis Date   ??? Autoimmune disease ([place])     RA   ??? Chronic obstructive pulmonary disease ([place])     very mild per pt.   ??? Chronic pain of left knee 06/25/2018   ??? GERD (gastroesophageal reflux disease)     controlled w/med   ??? H/O mammogram 07/28/2016    mammogram and Ultrasound of right breaset- benign finding- [facility]    ??? Hearing loss of both ears    ??? HTN (hypertension), benign 01/30/2014    controlled w/med   ??? Hypothyroid     on med for control   ??? Menopause    ??? Osteoarthritis    ??? Osteoporosis    ??? 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 Debbra Riding. Methotrexate Rx   ??? Sinusitis    ??? Unspecified hypothyroidism 01/30/2014   ??? Vitamin D deficiency        Allergies:  No Known Allergies    Current Medications:  Current Outpatient Medications   Medication Sig Dispense Refill   ??? fluticasone propionate (FLONASE) 50 mcg/actuation nasal spray 2 Sprays by Both Nostrils route daily. 1 Bottle 5   ??? bacitracin-neomycin-polymyxin b-hydrocortisone (CORTISPORIN) 1 % ointment Use samll amount 3-4 times a day. 15 g 5   ??? losartan (COZAAR) 100 mg tablet TAKE 1 TABLET BY MOUTH DAILY 30 Tab 11   ??? levothyroxine (SYNTHROID) 100 mcg tablet Take 1 Tab by mouth Daily  (before breakfast). 30 Tab 11   ??? mupirocin (BACTROBAN) 2 % ointment Apply  to affected area daily. 15 g 1   ??? denosumab (PROLIA) 60 mg/mL injection 60 mg by SubCUTAneous route.     ??? Cholecalciferol, Vitamin D3, 50,000 unit cap Take 1 Cap by mouth every Monday.     ??? methotrexate, PF, 25 mg/mL injection 25 mg every seven (7) days.     ??? nicotinic acid (NIACIN) 500 mg tablet Take 500 mg by mouth Daily (before breakfast). 2 po everyday.     ??? calcium-cholecalciferol, d3, (CALCIUM 600 + D) 600-125 mg-unit tab Take 2 Tabs by mouth.     ??? multivitamin (ONE A DAY) tablet Take 1 Tab by mouth daily.     ??? folic acid (FOLVITE) 1 mg tablet Take  by mouth daily.     ??? cefUROXime (CEFTIN) 250 mg tablet Take 1 Tab by mouth two (2) times a day. 20 Tab 0       Review of Systems:  Review of Systems   Skin: Positive for itching and rash.   All other systems reviewed and are negative.  Vitals:  Visit Vitals  BP 120/80   Ht '5\' 6"'  (1.676 m)   Wt 161 lb (73 kg)   BMI 25.99 kg/m??       Physical Exam:  Physical Exam  Vitals signs reviewed.   Constitutional:       Appearance: Normal appearance.   HENT:      Head: Normocephalic and atraumatic.      Left Ear: Tympanic membrane normal.      Ears:      Comments: Right TM with perforated area  No drainage noted  Cardiovascular:      Rate and Rhythm: Normal rate and regular rhythm.      Pulses: Normal pulses.      Heart sounds: Normal heart sounds.   Pulmonary:      Effort: Pulmonary effort is normal.      Breath sounds: Normal breath sounds.   Skin:     General: Skin is warm and dry.      Comments: Dryness of left upper back noted  No rash noted  Left lower leg has a seb keratosis   Neurological:      Mental Status: She is alert and oriented to person, place, and time.   Psychiatric:         Mood and Affect: Mood normal.         Behavior: Behavior normal.         Thought Content: Thought content normal.         Judgment: Judgment normal.         Assessment/Plan:    Diagnoses and all orders for this visit:    1. Rheumatoid arthritis, involving unspecified site, unspecified rheumatoid factor presence (HCC)  -     CBC WITH AUTOMATED DIFF  -     METABOLIC PANEL, COMPREHENSIVE  -     C REACTIVE PROTEIN, QT  -     SED RATE (ESR)  -     HEMOGLOBIN A1C WITH EAG  Follow with Rheumatology  2. HTN (hypertension), benign  -     CBC WITH AUTOMATED DIFF  -     METABOLIC PANEL, COMPREHENSIVE  -     C REACTIVE PROTEIN, QT  -     SED RATE (ESR)  -     HEMOGLOBIN A1C WITH EAG  monitor  3. Prediabetes  -     CBC WITH AUTOMATED DIFF  -     METABOLIC PANEL, COMPREHENSIVE  -     C REACTIVE PROTEIN, QT  -     SED RATE (ESR)  -     HEMOGLOBIN A1C WITH EAG    4. Osteoporosis, unspecified osteoporosis type, unspecified pathological fracture presence  -     CBC WITH AUTOMATED DIFF  -     METABOLIC PANEL, COMPREHENSIVE  -     C REACTIVE PROTEIN, QT  -     SED RATE (ESR)  -     HEMOGLOBIN A1C WITH EAG    5. Chronic bilateral low back pain without sciatica  -     CBC WITH AUTOMATED DIFF  -     METABOLIC PANEL, COMPREHENSIVE  -     C REACTIVE PROTEIN, QT  -     SED RATE (ESR)  -     HEMOGLOBIN A1C WITH EAG    6. Elevated blood sugar  -     CBC WITH AUTOMATED DIFF  -     METABOLIC PANEL, COMPREHENSIVE  -  C REACTIVE PROTEIN, QT  -     SED RATE (ESR)  -     HEMOGLOBIN A1C WITH EAG  Watch diet  7. Inflamed seborrheic keratosis  -     DESTRUC BENIGN LESION, UP TO 14 LESIONS  Treated with LN therapy  8. Pre-ulcerative corn or callous  -     REFERRAL TO PODIATRY  Will refer to podiatry for treatment- using corn pad now  9. Acquired hypothyroidism  Need to update lab  10. Chronic pain of left knee  Seeing Dr Debbra Riding  Other orders  -     fluticasone propionate (FLONASE) 50 mcg/actuation nasal spray; 2 Sprays by Both Nostrils route daily.  -     bacitracin-neomycin-polymyxin b-hydrocortisone (CORTISPORIN) 1 % ointment; Use samll amount 3-4 times a day.       11.  Dermatitis of back- try 1% Hydrocortizone cream and Aloe vera gel topically daily        Audrey Snow Reedy, MD

## 2018-06-25 NOTE — Progress Notes (Signed)
Thyroid ok continue present dose  Copy to pt

## 2018-06-26 LAB — CBC WITH AUTO DIFFERENTIAL
Basophils %: 1 %
Basophils Absolute: 0 10*3/uL (ref 0.0–0.2)
Eosinophils %: 3 %
Eosinophils Absolute: 0.1 10*3/uL (ref 0.0–0.4)
Granulocyte Absolute Count: 0 10*3/uL (ref 0.0–0.1)
Hematocrit: 40.4 % (ref 34.0–46.6)
Hemoglobin: 14 g/dL (ref 11.1–15.9)
Immature Granulocytes: 0 %
Lymphocytes %: 36 %
Lymphocytes Absolute: 1.7 10*3/uL (ref 0.7–3.1)
MCH: 32 pg (ref 26.6–33.0)
MCHC: 34.7 g/dL (ref 31.5–35.7)
MCV: 92 fL (ref 79–97)
Monocytes %: 9 %
Monocytes Absolute: 0.4 10*3/uL (ref 0.1–0.9)
Neutrophils %: 51 %
Neutrophils Absolute: 2.4 10*3/uL (ref 1.4–7.0)
Platelets: 220 10*3/uL (ref 150–450)
RBC: 4.37 x10E6/uL (ref 3.77–5.28)
RDW: 12.8 % (ref 12.3–15.4)
WBC: 4.7 10*3/uL (ref 3.4–10.8)

## 2018-06-26 LAB — HEMOGLOBIN A1C W/EAG
Hemoglobin A1C: 6 % — ABNORMAL HIGH (ref 4.8–5.6)
eAG: 126 mg/dL

## 2018-06-26 LAB — COMPREHENSIVE METABOLIC PANEL
ALT: 13 IU/L (ref 0–32)
AST: 15 IU/L (ref 0–40)
Albumin/Globulin Ratio: 1.8 NA (ref 1.2–2.2)
Albumin: 4.2 g/dL (ref 3.5–4.8)
Alkaline Phosphatase: 39 IU/L (ref 39–117)
BUN: 13 mg/dL (ref 8–27)
Bun/Cre Ratio: 18 NA (ref 12–28)
CO2: 24 mmol/L (ref 20–29)
Calcium: 9.3 mg/dL (ref 8.7–10.3)
Chloride: 101 mmol/L (ref 96–106)
Creatinine: 0.72 mg/dL (ref 0.57–1.00)
EGFR IF NonAfrican American: 80 mL/min/{1.73_m2} (ref >59)
GFR African American: 93 mL/min/{1.73_m2} (ref >59)
Globulin, Total: 2.4 g/dL (ref 1.5–4.5)
Glucose: 102 mg/dL — ABNORMAL HIGH (ref 65–99)
Potassium: 4.8 mmol/L (ref 3.5–5.2)
Sodium: 137 mmol/L (ref 134–144)
Total Bilirubin: 1 mg/dL (ref 0.0–1.2)
Total Protein: 6.6 g/dL (ref 6.0–8.5)

## 2018-06-26 LAB — C-REACTIVE PROTEIN: CRP: 3 mg/L (ref 0–10)

## 2018-06-26 LAB — SEDIMENTATION RATE: Sed Rate: 9 mm/hr (ref 0–40)

## 2018-06-26 LAB — METABOLIC PANEL, COMPREHENSIVE
A-G Ratio: 1.8 (ref 1.2–2.2)
ALT (SGPT): 13 IU/L (ref 0–32)
AST (SGOT): 15 IU/L (ref 0–40)
Albumin: 4.2 g/dL (ref 3.5–4.8)
Alk. phosphatase: 39 IU/L (ref 39–117)
BUN/Creatinine ratio: 18 (ref 12–28)
BUN: 13 mg/dL (ref 8–27)
Bilirubin, total: 1 mg/dL (ref 0.0–1.2)
CO2: 24 mmol/L (ref 20–29)
Calcium: 9.3 mg/dL (ref 8.7–10.3)
Chloride: 101 mmol/L (ref 96–106)
Creatinine: 0.72 mg/dL (ref 0.57–1.00)
GFR est AA: 93 mL/min/{1.73_m2} (ref >59)
GFR est non-AA: 80 mL/min/{1.73_m2} (ref >59)
GLOBULIN, TOTAL: 2.4 g/dL (ref 1.5–4.5)
Glucose: 102 mg/dL — ABNORMAL HIGH (ref 65–99)
Potassium: 4.8 mmol/L (ref 3.5–5.2)
Protein, total: 6.6 g/dL (ref 6.0–8.5)
Sodium: 137 mmol/L (ref 134–144)

## 2018-06-26 LAB — CBC WITH AUTOMATED DIFF
ABS. BASOPHILS: 0 10*3/uL (ref 0.0–0.2)
ABS. EOSINOPHILS: 0.1 10*3/uL (ref 0.0–0.4)
ABS. IMM. GRANS.: 0 10*3/uL (ref 0.0–0.1)
ABS. MONOCYTES: 0.4 10*3/uL (ref 0.1–0.9)
ABS. NEUTROPHILS: 2.4 10*3/uL (ref 1.4–7.0)
Abs Lymphocytes: 1.7 10*3/uL (ref 0.7–3.1)
BASOPHILS: 1 %
EOSINOPHILS: 3 %
HCT: 40.4 % (ref 34.0–46.6)
HGB: 14 g/dL (ref 11.1–15.9)
IMMATURE GRANULOCYTES: 0 %
Lymphocytes: 36 %
MCH: 32 pg (ref 26.6–33.0)
MCHC: 34.7 g/dL (ref 31.5–35.7)
MCV: 92 fL (ref 79–97)
MONOCYTES: 9 %
NEUTROPHILS: 51 %
PLATELET: 220 10*3/uL (ref 150–450)
RBC: 4.37 x10E6/uL (ref 3.77–5.28)
RDW: 12.8 % (ref 12.3–15.4)
WBC: 4.7 10*3/uL (ref 3.4–10.8)

## 2018-06-26 LAB — HEMOGLOBIN A1C WITH EAG
Estimated average glucose: 126 mg/dL
Hemoglobin A1c: 6 % — ABNORMAL HIGH (ref 4.8–5.6)

## 2018-06-26 LAB — SED RATE (ESR): Sed rate (ESR): 9 mm/hr (ref 0–40)

## 2018-06-26 LAB — C REACTIVE PROTEIN, QT: C-Reactive Protein, Qt: 3 mg/L (ref 0–10)

## 2018-06-29 LAB — TSH 3RD GENERATION
TSH: 2.19 u[IU]/mL (ref 0.450–4.500)
TSH: 2.19 u[IU]/mL (ref 0.450–4.500)

## 2018-06-29 LAB — SPECIMEN STATUS REPORT

## 2018-09-15 ENCOUNTER — Ambulatory Visit (HOSPITAL_COMMUNITY)
Admission: EM | Admit: 2018-09-15 | Discharge: 2018-09-15 | Disposition: A | Discharge status: Home / Self Care | Payer: Medicare Other | Attending: Family Medicine | Admitting: Family Medicine

## 2018-09-15 ENCOUNTER — Encounter (HOSPITAL_COMMUNITY): Payer: Self-pay

## 2018-09-15 ENCOUNTER — Ambulatory Visit (INDEPENDENT_AMBULATORY_CARE_PROVIDER_SITE_OTHER): Payer: Medicare Other

## 2018-09-15 DIAGNOSIS — R509 Fever, unspecified: Secondary | ICD-10-CM | POA: Diagnosis not present

## 2018-09-15 DIAGNOSIS — R5383 Other fatigue: Secondary | ICD-10-CM | POA: Diagnosis not present

## 2018-09-15 DIAGNOSIS — R079 Chest pain, unspecified: Secondary | ICD-10-CM | POA: Diagnosis not present

## 2018-09-15 DIAGNOSIS — R062 Wheezing: Secondary | ICD-10-CM

## 2018-09-15 DIAGNOSIS — R05 Cough: Secondary | ICD-10-CM

## 2018-09-15 DIAGNOSIS — J209 Acute bronchitis, unspecified: Secondary | ICD-10-CM

## 2018-09-15 HISTORY — DX: Unspecified osteoarthritis, unspecified site: M19.90

## 2018-09-15 HISTORY — DX: Pneumonia, unspecified organism: J18.9

## 2018-09-15 HISTORY — DX: Bronchitis, not specified as acute or chronic: J40

## 2018-09-15 MED ORDER — BENZONATATE 200 MG PO CAPS: 200.0000 mg | ORAL_CAPSULE | Freq: Three times a day (TID) | ORAL | 0 refills | Status: AC | PRN

## 2018-09-15 MED ORDER — AZITHROMYCIN 250 MG PO TABS: 250.0000 mg | ORAL_TABLET | Freq: Every day | ORAL | 0 refills | Status: AC

## 2018-09-15 MED ORDER — PREDNISONE 50 MG PO TABS: 50.0000 mg | ORAL_TABLET | Freq: Every day | ORAL | 0 refills | Status: AC

## 2018-09-15 MED ORDER — ALBUTEROL SULFATE HFA 108 (90 BASE) MCG/ACT IN AERS: 1.0000 | INHALATION_SPRAY | Freq: Four times a day (QID) | RESPIRATORY_TRACT | 0 refills | Status: AC | PRN

## 2018-09-15 NOTE — ED Triage Notes (Signed)
Pt presents with upper respiratory cold; congestion,productive cough with green mucus, fatigue, and chest tightness since Sunday.

## 2018-09-15 NOTE — ED Provider Notes (Signed)
[facility]    CSN: [id number] Arrival date & time: 09/15/18  1406     History   Chief Complaint Chief Complaint  Patient presents with  . Fatigue  . Chest Pain  . Cough  . Congestion    HPI Jeanette Ellison is a 78 y.o. female  history of arthritis, mild COPD, presenting today for evaluation of cough.  Patient states that beginning Sunday she started to have some mild cough and congestion.  Symptoms have continued to progress and worsen over the past 4 days.  Of recently she is also developed sweats and hot and cold chills.  She is felt more fatigued of recently.  She is currently staying in the hospital with her husband.  Cough is been productive, feel similar to when she has had bronchitis in the past.  She has had intermittent chest tightness with an intermittent burning achy sensation.  She is been taking an over-the-counter cold medicine twice a day, but is unsure of which medicine, she is also taken Tylenol intermittently.  She noted a low-grade temperature yesterday evening.  Mild sore throat.  HPI  Past Medical History:  Diagnosis Date  . Arthritis   . Bronchitis   . Pneumonia     There are no active problems to display for this patient.   Past Surgical History:  Procedure Laterality Date  . BREAST LUMPECTOMY    . CARPAL TUNNEL RELEASE      OB History   No obstetric history on file.      Home Medications    Prior to Admission medications   Medication Sig Start Date End Date Taking? Authorizing Provider  albuterol (PROVENTIL HFA;VENTOLIN HFA) 108 (90 Base) MCG/ACT inhaler Inhale 1-2 puffs into the lungs every 6 (six) hours as needed for wheezing or shortness of breath. 09/15/18   Amamda Curbow C, PA-C  azithromycin (ZITHROMAX) 250 MG tablet Take 1 tablet (250 mg total) by mouth daily. Take first 2 tablets together, then 1 every day until finished. 09/15/18   Fern Canova C, PA-C  benzonatate (TESSALON) 200 MG capsule Take 1 capsule (200 mg  total) by mouth 3 (three) times daily as needed for up to 7 days for cough. 09/15/18 09/22/18  Milfred Krammes C, PA-C  predniSONE (DELTASONE) 50 MG tablet Take 1 tablet (50 mg total) by mouth daily for 5 days. 09/15/18 09/20/18  Zamia Tyminski, Junius Creamer, PA-C    Family History History reviewed. No pertinent family history.  Social History Social History   Tobacco Use  . Smoking status: Never Smoker  Substance Use Topics  . Alcohol use: Not on file  . Drug use: Not on file     Allergies   Yeast-related products   Review of Systems Review of Systems  Constitutional: Positive for chills and fatigue. Negative for activity change, appetite change and fever.  HENT: Positive for congestion, rhinorrhea and sore throat. Negative for ear pain, sinus pressure and trouble swallowing.   Eyes: Negative for discharge and redness.  Respiratory: Positive for cough and chest tightness. Negative for shortness of breath.   Cardiovascular: Negative for chest pain.  Gastrointestinal: Negative for abdominal pain, diarrhea, nausea and vomiting.  Musculoskeletal: Negative for myalgias.  Skin: Negative for rash.  Neurological: Negative for dizziness, light-headedness and headaches.     Physical Exam Triage Vital Signs ED Triage Vitals  Enc Vitals Group     BP 09/15/18 1440 (!) 149/74     Pulse Rate 09/15/18 1440 95  Resp 09/15/18 1440 17     Temp 09/15/18 1440 (!) 97.4 F (36.3 C)     Temp Source 09/15/18 1440 Oral     SpO2 09/15/18 1440 93 %     Weight --      Height --      Head Circumference --      Peak Flow --      Pain Score 09/15/18 1444 5     Pain Loc --      Pain Edu? --      Excl. in GC? --    No data found.  Updated Vital Signs BP (!) 149/74 (BP Location: Right Arm)   Pulse 95   Temp (!) 97.4 F (36.3 C) (Oral)   Resp 17   SpO2 93%   Visual Acuity Right Eye Distance:   Left Eye Distance:   Bilateral Distance:    Right Eye Near:   Left Eye Near:    Bilateral Near:      Physical Exam Vitals signs and nursing note reviewed.  Constitutional:      General: She is not in acute distress.    Appearance: She is well-developed.  HENT:     Head: Normocephalic and atraumatic.     Ears:     Comments: Bilateral TMs with hole present from previous tympanostomy tubes, right TM appears slightly dull and slightly erythematous, canal without erythema but slightly edematous     Mouth/Throat:     Comments: Oral mucosa pink and moist, no tonsillar enlargement or exudate. Posterior pharynx patent and nonerythematous, no uvula deviation or swelling. Normal phonation. Eyes:     Conjunctiva/sclera: Conjunctivae normal.  Neck:     Musculoskeletal: Neck supple.  Cardiovascular:     Rate and Rhythm: Normal rate and regular rhythm.     Heart sounds: No murmur.  Pulmonary:     Effort: Pulmonary effort is normal. No respiratory distress.     Breath sounds: Normal breath sounds.     Comments: Faint expiratory wheezing auscultated bilaterally, no rhonchi or rales, breathing comfortably at rest Abdominal:     Palpations: Abdomen is soft.     Tenderness: There is no abdominal tenderness.  Skin:    General: Skin is warm and dry.  Neurological:     Mental Status: She is alert.      UC Treatments / Results  Labs (all labs ordered are listed, but only abnormal results are displayed) Labs Reviewed - No data to display  EKG None  Radiology Dg Chest 2 View  Result Date: 09/15/2018 CLINICAL DATA:  Cough for 4 days EXAM: CHEST - 2 VIEW COMPARISON:  None. FINDINGS: The heart size and mediastinal contours are within normal limits. Lungs are hyperinflated. No focal consolidation. 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 The visualized skeletal structures are unremarkable. IMPRESSION: Hyperinflation without focal airspace disease. Electronically Signed   By: Deatra RobinsonKevin  Herman M.D.   On: 09/15/2018 15:38    Procedures Procedures (including  critical care time)  Medications Ordered in UC Medications - No data to display  Initial Impression / Assessment and Plan / UC Course  I have reviewed the triage vital signs and the nursing notes.  Pertinent labs & imaging results that were available during my care of the patient were reviewed by me and considered in my medical decision making (see chart for details).     Chest x-ray negative for pneumonia, more likely bronchitis.  Will treat as such, given age and  comorbidities will get initiated on azithromycin to cover atypicals.  Albuterol as needed for shortness of breath.  Patient wishing to defer steroids, very faint wheezing present, advised if symptoms worsening she may initiate this.  Tessalon for cough.  Antihistamine/Mucinex for congestion and drainage.  Continue to monitor, rest, push fluids.Discussed strict return precautions. Patient verbalized understanding and is agreeable with plan.  Final Clinical Impressions(s) / UC Diagnoses   Final diagnoses:  Acute bronchitis, unspecified organism     Discharge Instructions     No pneumonia on chest x-ray, but to treat you for bronchitis Please begin azithromycin-2 tablets today, 1 tablet for the following 4 days Albuterol inhaler as needed for shortness of breath, chest tightness, wheezing Prednisone daily for the next 5 days with food on her stomach Tessalon every 8 hours for cough For nasal congestion and drainage you may use Mucinex or over-the-counter Zyrtec/Claritin  Please follow-up if symptoms not improving, worsening, developing increased shortness of breath or difficulty breathing, fevers    ED Prescriptions    Medication Sig Dispense Auth. Provider   predniSONE (DELTASONE) 50 MG tablet Take 1 tablet (50 mg total) by mouth daily for 5 days. 5 tablet Alvetta Hidrogo C, PA-C   albuterol (PROVENTIL HFA;VENTOLIN HFA) 108 (90 Base) MCG/ACT inhaler Inhale 1-2 puffs into the lungs every 6 (six) hours as needed for  wheezing or shortness of breath. 1 Inhaler Brayden Betters C, PA-C   azithromycin (ZITHROMAX) 250 MG tablet Take 1 tablet (250 mg total) by mouth daily. Take first 2 tablets together, then 1 every day until finished. 6 tablet Lumen Brinlee C, PA-C   benzonatate (TESSALON) 200 MG capsule Take 1 capsule (200 mg total) by mouth 3 (three) times daily as needed for up to 7 days for cough. 28 capsule Ariaunna Longsworth C, PA-C     Controlled Substance Prescriptions [place] Controlled Substance Registry consulted? Not Applicable   Lew Dawes, [place] 09/16/18 1010

## 2018-09-15 NOTE — Discharge Instructions (Signed)
No pneumonia on chest x-ray, but to treat you for bronchitis Please begin azithromycin-2 tablets today, 1 tablet for the following 4 days Albuterol inhaler as needed for shortness of breath, chest tightness, wheezing Prednisone daily for the next 5 days with food on her stomach Tessalon every 8 hours for cough For nasal congestion and drainage you may use Mucinex or over-the-counter Zyrtec/Claritin  Please follow-up if symptoms not improving, worsening, developing increased shortness of breath or difficulty breathing, fevers

## 2018-09-27 MED ORDER — LOSARTAN 100 MG TAB
100 mg | ORAL_TABLET | ORAL | 1 refills | Status: DC
Start: 2018-09-27 — End: 2018-12-03

## 2018-09-27 NOTE — Telephone Encounter (Signed)
Requested Prescriptions     Pending Prescriptions Disp Refills   ??? losartan (COZAAR) 100 mg tablet 90 Tab 1     Sig: TAKE 1 TABLET BY MOUTH DAILY

## 2018-12-03 MED ORDER — LOSARTAN 100 MG TAB
100 mg | ORAL_TABLET | ORAL | 1 refills | Status: DC
Start: 2018-12-03 — End: 2019-07-05

## 2018-12-03 NOTE — Telephone Encounter (Signed)
Requested Prescriptions     Pending Prescriptions Disp Refills   ??? losartan (COZAAR) 100 mg tablet 90 Tab 1     Sig: TAKE 1 TABLET BY MOUTH DAILY     Changing to [facility]

## 2018-12-09 ENCOUNTER — Encounter

## 2019-01-04 ENCOUNTER — Ambulatory Visit: Attending: Internal Medicine | Primary: Internal Medicine

## 2019-01-04 ENCOUNTER — Ambulatory Visit: Admit: 2019-01-04 | Discharge: 2019-01-04 | Payer: MEDICARE | Attending: Internal Medicine | Primary: Internal Medicine

## 2019-01-04 DIAGNOSIS — Z0000 Encounter for general adult medical examination without abnormal findings: Secondary | ICD-10-CM

## 2019-01-04 MED ORDER — MUPIROCIN 2 % OINTMENT
2 % | Freq: Every day | CUTANEOUS | 1 refills | Status: DC
Start: 2019-01-04 — End: 2020-02-02

## 2019-01-04 NOTE — Progress Notes (Signed)
Blood sugar mildly elevated so watch sweets in diet  Cholesterol has increased so watch diet   Continue all medications   Copy of labs to pt via mail

## 2019-01-04 NOTE — Patient Instructions (Signed)
Medicare Wellness Visit, Female     The best way to live healthy is to have a lifestyle where you eat a well-balanced diet, exercise regularly, limit alcohol use, and quit all forms of tobacco/nicotine, if applicable.     Regular preventive services are another way to keep healthy. Preventive services (vaccines, screening tests, monitoring & exams) can help personalize your care plan, which helps you manage your own care. Screening tests can find health problems at the earliest stages, when they are easiest to treat.   [facility] follows the current, evidence-based guidelines published by the [place] [employer] (USPSTF) when recommending preventive services for our patients. Because we follow these guidelines, sometimes recommendations change over time as research supports it. (For example, mammograms used to be recommended annually. Even though Medicare will still pay for an annual mammogram, the newer guidelines recommend a mammogram every two years for women of average risk).  Of course, you and your doctor may decide to screen more often for some diseases, based on your risk and your co-morbidities (chronic disease you are already diagnosed with).     Preventive services for you include:  - Medicare offers their members a free annual wellness visit, which is time for you and your primary care provider to discuss and plan for your preventive service needs. Take advantage of this benefit every year!  -All adults over the age of 67 should receive the recommended pneumonia vaccines. Current USPSTF guidelines recommend a series of two vaccines for the best pneumonia protection.   -All adults should have a flu vaccine yearly and a tetanus vaccine every 10 years.   -All adults age 24 and older should receive the shingles vaccines (series of two vaccines).      -All adults age 52-70 who are overweight should have a diabetes screening test once every three years.    -All adults born between [birth year] and 1966 should be screened once for Hepatitis C.  -Other screening tests and preventive services for persons with diabetes include: an eye exam to screen for diabetic retinopathy, a kidney function test, a foot exam, and stricter control over your cholesterol.   -Cardiovascular screening for adults with routine risk involves an electrocardiogram (ECG) at intervals determined by your doctor.   -Colorectal cancer screenings should be done for adults age 46-75 with no increased risk factors for colorectal cancer.  There are a number of acceptable methods of screening for this type of cancer. Each test has its own benefits and drawbacks. Discuss with your doctor what is most appropriate for you during your annual wellness visit. The different tests include: colonoscopy (considered the best screening method), a fecal occult blood test, a fecal DNA test, and sigmoidoscopy.    -A bone mass density test is recommended when a woman turns 65 to screen for osteoporosis. This test is only recommended one time, as a screening. Some providers will use this same test as a disease monitoring tool if you already have osteoporosis.  -Breast cancer screenings are recommended every other year for women of normal risk, age 37-74.  -Cervical cancer screenings for women over age 16 are only recommended with certain risk factors.     Here is a list of your current Health Maintenance items (your personalized list of preventive services) with a due date:  Health Maintenance Due   Topic Date Due   ??? DTaP/Tdap/Td  (1 - Tdap) 08/07/1960   ??? Glaucoma Screening   08/07/2004   ???  Annual Well Visit  12/03/2018

## 2019-01-04 NOTE — Progress Notes (Signed)
HPI: Audrey Snow (DOB: 07-02-1941)  Medicare wellness    Still seeing Rheum and having issues with her knees recently    Still working at [employer]    Her back has been itching in one location    Problem List:  Patient Active Problem List   Diagnosis Code   ??? Rheumatoid arthritis ([place]) M06.9   ??? Hypothyroidism E03.9   ??? Vitamin D deficiency E55.9   ??? Osteoporosis M81.0   ??? Gastroesophageal reflux disease without esophagitis K21.9   ??? HTN (hypertension), benign I10   ??? Environmental allergies Z91.09   ??? 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 Prediabetes R73.03   ??? Chronic pain of left knee M25.562, G89.29   ??? 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 Hypercholesteremia E78.00       History:  Past Medical History:   Diagnosis Date   ??? Autoimmune disease ([place])     RA   ??? Chronic obstructive pulmonary disease ([place])     very mild per pt.   ??? Chronic pain of left knee 06/25/2018   ??? GERD (gastroesophageal reflux disease)     controlled w/med   ??? H/O mammogram 07/28/2016    mammogram and Ultrasound of right breaset- benign finding- [facility]    ??? Hearing loss of both ears    ??? HTN (hypertension), benign 01/30/2014    controlled w/med   ??? Hypothyroid     on med for control   ??? Menopause    ??? Osteoarthritis    ??? Osteoporosis    ??? 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 Debbra Riding. Methotrexate Rx   ??? Sinusitis    ??? Unspecified hypothyroidism 01/30/2014   ??? Vitamin D deficiency        Allergies:  No Known Allergies    Current Medications:  Current Outpatient Medications   Medication Sig Dispense Refill   ??? mupirocin (BACTROBAN) 2 % ointment Apply  to affected area daily. 15 g 1   ??? losartan (COZAAR) 100 mg tablet TAKE 1 TABLET BY MOUTH DAILY 90 Tab 1   ??? fluticasone propionate (FLONASE) 50 mcg/actuation nasal spray 2 Sprays by Both Nostrils route daily. 1 Bottle 5   ??? bacitracin-neomycin-polymyxin b-hydrocortisone (CORTISPORIN) 1 % ointment Use samll amount 3-4 times a day. 15 g 5    ??? levothyroxine (SYNTHROID) 100 mcg tablet Take 1 Tab by mouth Daily (before breakfast). 30 Tab 11   ??? denosumab (PROLIA) 60 mg/mL injection 60 mg by SubCUTAneous route.     ??? Cholecalciferol, Vitamin D3, 50,000 unit cap Take 1 Cap by mouth every Monday.     ??? methotrexate, PF, 25 mg/mL injection 25 mg every seven (7) days.     ??? nicotinic acid (NIACIN) 500 mg tablet Take 500 mg by mouth Daily (before breakfast). 2 po everyday.     ??? calcium-cholecalciferol, d3, (CALCIUM 600 + D) 600-125 mg-unit tab Take 2 Tabs by mouth.     ??? multivitamin (ONE A DAY) tablet Take 1 Tab by mouth daily.     ??? folic acid (FOLVITE) 1 mg tablet Take  by mouth daily.         Review of Systems:  Review of Systems   Constitutional: Negative for weight loss.   HENT: Positive for hearing loss.    Respiratory: Positive for shortness of breath.    Cardiovascular: Negative for chest pain.   Musculoskeletal: Positive for joint pain. Negative for falls.  Skin: Positive for itching.        back   Psychiatric/Behavioral: Negative for depression and memory loss.   All other systems reviewed and are negative.      Vitals:  Visit Vitals  Temp 97.2 ??F (36.2 ??C)   Ht 5\' 6"  (1.676 m)   Wt 165 lb 9.6 oz (75.1 kg)   BMI 26.73 kg/m??       Physical Exam:  Physical Exam  Vitals signs reviewed.   Constitutional:       Appearance: Normal appearance. She is normal weight.   HENT:      Head: Normocephalic and atraumatic.   Neck:      Musculoskeletal: Normal range of motion and neck supple.   Cardiovascular:      Rate and Rhythm: Normal rate and regular rhythm.      Pulses: Normal pulses.      Heart sounds: Normal heart sounds.   Pulmonary:      Effort: Pulmonary effort is normal.      Breath sounds: Normal breath sounds.   Musculoskeletal:         General: Deformity present.      Comments: Both knees  ambulatory   Skin:     General: Skin is warm and dry.      Comments: Dry skin on upper back   Neurological:      General: No focal deficit present.       Mental Status: She is alert and oriented to person, place, and time.   Psychiatric:         Mood and Affect: Mood normal.         Behavior: Behavior normal.         Thought Content: Thought content normal.         Judgment: Judgment normal.         Assessment/Plan:   Diagnoses and all orders for this visit:    1. Encounter for Medicare annual wellness exam  -     CBC W/O DIFF  -     METABOLIC PANEL, COMPREHENSIVE  -     LIPID PANEL  -     TSH 3RD GENERATION  immun reviewed  2. Rheumatoid arthritis, involving unspecified site, unspecified rheumatoid factor presence ([place])  Had fluid drawn off of right knee recently  Continue to follow  3. Vitamin D deficiency    4. HTN (hypertension), benign  -     CBC W/O DIFF  -     METABOLIC PANEL, COMPREHENSIVE  Stable on present meds  5. Acquired hypothyroidism  -     TSH 3RD GENERATION  Update labs  6. Osteoporosis, unspecified osteoporosis type, unspecified pathological fracture presence  BD 12/18    7. Hypercholesteremia  -     LIPID PANEL    Other orders  -     mupirocin (BACTROBAN) 2 % ointment; Apply  to affected area daily.    8. COPD- need RF on inhaler    [name], MD      This is the Subsequent Medicare Annual Wellness Exam, performed 12 months or more after the Initial AWV or the last Subsequent AWV    I have reviewed the patient's medical history in detail and updated the computerized patient record.     History     Patient Active Problem List   Diagnosis Code   ??? Rheumatoid arthritis ([place]) M06.9   ??? Hypothyroidism E03.9   ??? Vitamin D deficiency E55.9   ???  Osteoporosis M81.0   ??? Gastroesophageal reflux disease without esophagitis K21.9   ??? HTN (hypertension), benign I10   ??? Environmental allergies Z91.09   ??? 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 Prediabetes R73.03   ??? Chronic pain of left knee M25.562, G89.29   ??? 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 Hypercholesteremia E78.00    ??? Chronic obstructive pulmonary disease (HCC) J44.9     Past Medical History:   Diagnosis Date   ??? Autoimmune disease ([place])     RA   ??? Chronic obstructive pulmonary disease ([place])     very mild per pt.   ??? Chronic pain of left knee 06/25/2018   ??? GERD (gastroesophageal reflux disease)     controlled w/med   ??? H/O mammogram 07/28/2016    mammogram and Ultrasound of right breaset- benign finding- [facility]    ??? Hearing loss of both ears    ??? HTN (hypertension), benign 01/30/2014    controlled w/med   ??? Hypothyroid     on med for control   ??? Menopause    ??? Osteoarthritis    ??? Osteoporosis    ??? 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 Debbra Riding. Methotrexate Rx   ??? Sinusitis    ??? Unspecified hypothyroidism 01/30/2014   ??? Vitamin D deficiency       Past Surgical History:   Procedure Laterality Date   ??? COLONOSCOPY N/A 01/27/2018    COLONOSCOPY/BMI 26 performed by Azalia Bilis, MD at [facility]   ??? HX CARPAL TUNNEL RELEASE Bilateral    ??? HX COLONOSCOPY      colon polyps-adenomatous, has seen Dr. Kristopher Glee in past   ??? 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 HX HEMORRHOIDECTOMY     ??? HX OTHER SURGICAL  09/2012    sinus surgery-Dr. Mickle Mallory   ??? 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 HX TONSILLECTOMY  78 yrs old   ??? PR COLONOSCOPY FLX DX W/COLLJ SPEC WHEN PFRMD  01/27/2018          Current Outpatient Medications   Medication Sig Dispense Refill   ??? mupirocin (BACTROBAN) 2 % ointment Apply  to affected area daily. 15 g 1   ??? losartan (COZAAR) 100 mg tablet TAKE 1 TABLET BY MOUTH DAILY 90 Tab 1   ??? fluticasone propionate (FLONASE) 50 mcg/actuation nasal spray 2 Sprays by Both Nostrils route daily. 1 Bottle 5   ??? bacitracin-neomycin-polymyxin b-hydrocortisone (CORTISPORIN) 1 % ointment Use samll amount 3-4 times a day. 15 g 5   ??? levothyroxine (SYNTHROID) 100 mcg tablet Take 1 Tab by mouth Daily (before breakfast). 30 Tab 11    ??? denosumab (PROLIA) 60 mg/mL injection 60 mg by SubCUTAneous route.     ??? Cholecalciferol, Vitamin D3, 50,000 unit cap Take 1 Cap by mouth every Monday.     ??? methotrexate, PF, 25 mg/mL injection 25 mg every seven (7) days.     ??? nicotinic acid (NIACIN) 500 mg tablet Take 500 mg by mouth Daily (before breakfast). 2 po everyday.     ??? calcium-cholecalciferol, d3, (CALCIUM 600 + D) 600-125 mg-unit tab Take 2 Tabs by mouth.     ??? multivitamin (ONE A DAY) tablet Take 1 Tab by mouth daily.     ??? folic acid (FOLVITE) 1 mg tablet Take  by mouth daily.  No Known Allergies    Family History   Problem Relation Age of Onset   ??? Arthritis-osteo Mother    ??? Alzheimer Father    ??? Breast Cancer Neg Hx      Social History     Tobacco Use   ??? Smoking status: Former Smoker     Packs/day: 0.50     Years: 43.00     Pack years: 21.50   ??? Smokeless tobacco: Never Used   ??? Tobacco comment: not regular when first started- 1pk/week   Substance Use Topics   ??? Alcohol use: No     Alcohol/week: 0.0 standard drinks       Depression Risk Factor Screening:     3 most recent PHQ Screens 01/04/2019   Little interest or pleasure in doing things Not at all   Feeling down, depressed, irritable, or hopeless Not at all   Total Score PHQ 2 0       Alcohol Risk Factor Screening:   Do you average 1 drink per night or more than 7 drinks a week:  No    On any one occasion in the past three months have you have had more than 3 drinks containing alcohol:  No      Functional Ability and Level of Safety:   Hearing: The patient wears hearing aids.     Activities of Daily Living:  The home contains: handrails and grab bars  Patient does total self care     Ambulation: with no difficulty     Fall Risk:  Fall Risk Assessment, last 12 mths 01/04/2019   Able to walk? Yes   Fall in past 12 months? No     Abuse Screen:  Patient is not abused       Cognitive Screening   Has your family/caregiver stated any concerns about your memory: no      Cognitive Screening: Normal - Verbal Fluency Test    Patient Care Team   Patient Care Team:  [name], MD as PCP - General (Internal Medicine)  Kate Sable Beryle Flock, MD as PCP - [facility] Empaneled Provider    Assessment/Plan   Education and counseling provided:  Are appropriate based on today's review and evaluation  Influenza Vaccine  Screening Mammography  Bone mass measurement (DEXA)  Screening for glaucoma    Diagnoses and all orders for this visit:    1. Encounter for Medicare annual wellness exam  -     CBC W/O DIFF  -     METABOLIC PANEL, COMPREHENSIVE  -     LIPID PANEL  -     TSH 3RD GENERATION    2. Rheumatoid arthritis, involving unspecified site, unspecified rheumatoid factor presence ([place])    3. Vitamin D deficiency    4. HTN (hypertension), benign  -     CBC W/O DIFF  -     METABOLIC PANEL, COMPREHENSIVE    5. Acquired hypothyroidism  -     TSH 3RD GENERATION    6. Osteoporosis, unspecified osteoporosis type, unspecified pathological fracture presence    7. Hypercholesteremia  -     LIPID PANEL    8. Chronic obstructive pulmonary disease, unspecified COPD type ([place])    Other orders  -     mupirocin (BACTROBAN) 2 % ointment; Apply  to affected area daily.        Health Maintenance Due   Topic Date Due   ??? DTaP/Tdap/Td series (1 - Tdap) 08/07/1960   ???  GLAUCOMA SCREENING Q2Y  08/07/2004   ??? Medicare Yearly Exam  12/03/2018

## 2019-01-04 NOTE — Progress Notes (Signed)
Progress Notes by [name], MD at 01/04/19 1040                Author: [name], MD  Service: --  Author Type: Physician       Filed: 01/04/19 1119  Encounter Date: 01/04/2019  Status: Signed          Editor: [name], MD (Physician)                       HPI: Audrey Snow (DOB: 1941/06/08 )   Medicare wellness      Still seeing Rheum and having issues with her knees recently      Still working at [employer]      Her back has been itching in one location      Problem List:     Patient Active Problem List        Diagnosis  Code         ?  Rheumatoid arthritis ([place])  M06.9     ?  Hypothyroidism  E03.9     ?  Vitamin D deficiency  E55.9     ?  Osteoporosis  M81.0     ?  Gastroesophageal reflux disease without esophagitis  K21.9     ?  HTN (hypertension), benign  I10     ?  Environmental allergies  Z91.09     ?  Chronic right-sided low back pain without sciatica  M54.5, G89.29     ?  Prediabetes  R73.03     ?  Chronic pain of left knee  M25.562, G89.29     ?  Pre-ulcerative corn or callous  L84         ?  Hypercholesteremia  E78.00           History:     Past Medical History:        Diagnosis  Date         ?  Autoimmune disease ([place])            RA         ?  Chronic obstructive pulmonary disease ([place])            very mild per pt.         ?  Chronic pain of left knee  06/25/2018     ?  GERD (gastroesophageal reflux disease)            controlled w/med         ?  H/O mammogram  07/28/2016          mammogram and Ultrasound of right breaset- benign finding- [facility]          ?  Hearing loss of both ears       ?  HTN (hypertension), benign  01/30/2014          controlled w/med         ?  Hypothyroid            on med for control         ?  Menopause       ?  Osteoarthritis       ?  Osteoporosis       ?  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 Debbra Riding. Methotrexate Rx         ?  Sinusitis       ?  Unspecified hypothyroidism  01/30/2014         ?  Vitamin D deficiency              Allergies:   No Known Allergies      Current Medications:     Current Outpatient Medications          Medication  Sig  Dispense  Refill           ?  mupirocin (BACTROBAN) 2 % ointment  Apply  to affected area daily.  15 g  1     ?  losartan (COZAAR) 100 mg tablet  TAKE 1 TABLET BY MOUTH DAILY  90 Tab  1     ?  fluticasone propionate (FLONASE) 50 mcg/actuation nasal spray  2 Sprays by Both Nostrils route daily.  1 Bottle  5     ?  bacitracin-neomycin-polymyxin b-hydrocortisone (CORTISPORIN) 1 % ointment  Use samll amount 3-4 times a day.  15 g  5     ?  levothyroxine (SYNTHROID) 100 mcg tablet  Take 1 Tab by mouth Daily (before breakfast).  30 Tab  11     ?  denosumab (PROLIA) 60 mg/mL injection  60 mg by SubCUTAneous route.         ?  Cholecalciferol, Vitamin D3, 50,000 unit cap  Take 1 Cap by mouth every Monday.         ?  methotrexate, PF, 25 mg/mL injection  25 mg every seven (7) days.         ?  nicotinic acid (NIACIN) 500 mg tablet  Take 500 mg by mouth Daily (before breakfast). 2 po everyday.         ?  calcium-cholecalciferol, d3, (CALCIUM 600 + D) 600-125 mg-unit tab  Take 2 Tabs by mouth.         ?  multivitamin (ONE A DAY) tablet  Take 1 Tab by mouth daily.               ?  folic acid (FOLVITE) 1 mg tablet  Take  by mouth daily.               Review of Systems:   Review of Systems    Constitutional: Negative for weight loss.    HENT: Positive for hearing loss.     Respiratory: Positive for shortness of breath.     Cardiovascular: Negative for chest pain.    Musculoskeletal: Positive for joint pain. Negative for falls.    Skin: Positive for itching.         back    Psychiatric/Behavioral: Negative for depression and memory loss.    All other systems reviewed and are negative.         Vitals:   Visit Vitals      Temp  97.2 ??F (36.2 ??C)     Ht  5\' 6"  (1.676 m)     Wt  165 lb 9.6 oz (75.1 kg)        BMI  26.73 kg/m??           Physical Exam:   Physical Exam   Vitals signs reviewed.   Constitutional:         Appearance: Normal appearance. She is normal weight.   HENT :       Head: Normocephalic and atraumatic.   Neck:       Musculoskeletal: Normal range of motion and neck supple.   Cardiovascular :  Rate and Rhythm: Normal rate and regular rhythm.      Pulses: Normal pulses.      Heart sounds: Normal heart sounds.    Pulmonary:       Effort: Pulmonary effort is normal.      Breath sounds: Normal breath sounds.   Musculoskeletal :          General: Deformity present.      Comments: Both knees  ambulatory     Skin:      General: Skin is warm and dry.      Comments: Dry skin on upper back   Neurological:       General: No focal deficit present.      Mental Status: She is alert and oriented to person, place, and time.    Psychiatric:         Mood and Affect: Mood normal.         Behavior: Behavior normal.         Thought Content: Thought content normal.         Judgment: Judgment normal.             Assessment/Plan:    Diagnoses and all orders for this visit:      1. Encounter for Medicare annual wellness exam   -     CBC W/O DIFF   -     METABOLIC PANEL, COMPREHENSIVE   -     LIPID PANEL   -     TSH 3RD GENERATION   immun reviewed   2. Rheumatoid arthritis, involving unspecified site, unspecified rheumatoid factor presence ([place])   Had fluid drawn off of right knee recently   Continue to follow   3. Vitamin D deficiency      4. HTN (hypertension), benign   -     CBC W/O DIFF   -     METABOLIC PANEL, COMPREHENSIVE   Stable on present meds   5. Acquired hypothyroidism   -     TSH 3RD GENERATION   Update labs   6. Osteoporosis, unspecified osteoporosis type, unspecified pathological fracture presence   BD 12/18      7. Hypercholesteremia   -     LIPID PANEL      Other orders   -     mupirocin (BACTROBAN) 2 % ointment; Apply  to affected area daily.      8. COPD- need RF on inhaler      [name], MD         This is the Subsequent Medicare Annual Wellness Exam, performed 12 months or more after the Initial  AWV or the last Subsequent AWV      I have reviewed the patient's medical history in detail and updated the computerized patient record.         History          Patient Active Problem List        Diagnosis  Code         ?  Rheumatoid arthritis ([place])  M06.9     ?  Hypothyroidism  E03.9     ?  Vitamin D deficiency  E55.9     ?  Osteoporosis  M81.0     ?  Gastroesophageal reflux disease without esophagitis  K21.9     ?  HTN (hypertension), benign  I10     ?  Environmental allergies  Z91.09     ?  Chronic right-sided low back  pain without sciatica  M54.5, G89.29     ?  Prediabetes  R73.03     ?  Chronic pain of left knee  M25.562, G89.29     ?  Pre-ulcerative corn or callous  L84     ?  Hypercholesteremia  E78.00         ?  Chronic obstructive pulmonary disease (HCC)  J44.9          Past Medical History:        Diagnosis  Date         ?  Autoimmune disease ([place])            RA         ?  Chronic obstructive pulmonary disease ([place])            very mild per pt.         ?  Chronic pain of left knee  06/25/2018     ?  GERD (gastroesophageal reflux disease)            controlled w/med         ?  H/O mammogram  07/28/2016          mammogram and Ultrasound of right breaset- benign finding- [facility]          ?  Hearing loss of both ears       ?  HTN (hypertension), benign  01/30/2014          controlled w/med         ?  Hypothyroid            on med for control         ?  Menopause       ?  Osteoarthritis       ?  Osteoporosis       ?  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 Debbra Riding. Methotrexate Rx         ?  Sinusitis       ?  Unspecified hypothyroidism  01/30/2014         ?  Vitamin D deficiency             Past Surgical History:         Procedure  Laterality  Date          ?  COLONOSCOPY  N/A  01/27/2018          COLONOSCOPY/BMI 26 performed by Azalia Bilis, MD at [facility]          ?  HX CARPAL TUNNEL RELEASE  Bilateral       ?  HX COLONOSCOPY              colon polyps-adenomatous, has seen Dr.  Kristopher Glee in past          ?  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  HX HEMORRHOIDECTOMY         ?  HX OTHER SURGICAL    09/2012          sinus surgery-Dr. Mickle Mallory          ?  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  HX TONSILLECTOMY    78 yrs old     ?  PR COLONOSCOPY FLX DX W/COLLJ SPEC WHEN PFRMD    01/27/2018                     Current Outpatient Medications          Medication  Sig  Dispense  Refill           ?  mupirocin (BACTROBAN) 2 % ointment  Apply  to affected area daily.  15 g  1     ?  losartan (COZAAR) 100 mg tablet  TAKE 1 TABLET BY MOUTH DAILY  90 Tab  1     ?  fluticasone propionate (FLONASE) 50 mcg/actuation nasal spray  2 Sprays by Both Nostrils route daily.  1 Bottle  5     ?  bacitracin-neomycin-polymyxin b-hydrocortisone (CORTISPORIN) 1 % ointment  Use samll amount 3-4 times a day.  15 g  5     ?  levothyroxine (SYNTHROID) 100 mcg tablet  Take 1 Tab by mouth Daily (before breakfast).  30 Tab  11     ?  denosumab (PROLIA) 60 mg/mL injection  60 mg by SubCUTAneous route.         ?  Cholecalciferol, Vitamin D3, 50,000 unit cap  Take 1 Cap by mouth every Monday.         ?  methotrexate, PF, 25 mg/mL injection  25 mg every seven (7) days.               ?  nicotinic acid (NIACIN) 500 mg tablet  Take 500 mg by mouth Daily (before breakfast). 2 po everyday.               ?  calcium-cholecalciferol, d3, (CALCIUM 600 + D) 600-125 mg-unit tab  Take 2 Tabs by mouth.         ?  multivitamin (ONE A DAY) tablet  Take 1 Tab by mouth daily.               ?  folic acid (FOLVITE) 1 mg tablet  Take  by mouth daily.            No Known Allergies        Family History         Problem  Relation  Age of Onset          ?  Arthritis-osteo  Mother       ?  Alzheimer  Father            ?  Breast Cancer  Neg Hx            Social History          Tobacco Use         ?  Smoking status:  Former Smoker              Packs/day:  0.50         Years:  43.00          Pack years:  21.50         ?  Smokeless tobacco:  Never Used        ?  Tobacco comment: not regular when first started- 1pk/week       Substance Use Topics         ?  Alcohol use:  No              Alcohol/week:  0.0 standard drinks             Depression Risk Factor Screening:           3 most recent PHQ Screens  01/04/2019        Little interest or pleasure in doing things  Not at all     Feeling down, depressed, irritable, or hopeless  Not at all        Total Score PHQ 2  0             Alcohol Risk Factor Screening:     Do you average 1 drink per night or more than 7 drinks a week:  No      On any one occasion in the past three months have you have had more than 3 drinks containing alcohol:  No           Functional Ability and Level of Safety:     Hearing:  The patient wears hearing aids.       Activities of Daily Living:   The home contains: handrails and grab bars   Patient does total self care       Ambulation: with no difficulty       Fall Risk:      Fall Risk Assessment, last 12 mths  01/04/2019        Able to walk?  Yes        Fall in past 12 months?  No        Abuse Screen:   Patient is not abused            Cognitive Screening     Has your family/caregiver stated any concerns about your memory: no       Cognitive Screening: Normal - Verbal Fluency Test        Patient Care Team     Patient Care Team:   [name], MD as PCP - General (Internal Medicine)   Kate Sable Beryle Flock, MD as PCP - [facility] Empaneled Provider        Assessment/Plan     Education and counseling provided:   Are appropriate based on today's review and evaluation   Influenza Vaccine   Screening Mammography   Bone mass measurement (DEXA)   Screening for glaucoma      Diagnoses and all orders for this visit:      1. Encounter for Medicare annual wellness exam   -     CBC W/O DIFF   -     METABOLIC PANEL, COMPREHENSIVE   -     LIPID PANEL   -     TSH 3RD GENERATION      2. Rheumatoid arthritis, involving unspecified site, unspecified  rheumatoid factor presence ([place])      3. Vitamin D deficiency      4. HTN (hypertension), benign   -     CBC W/O DIFF   -     METABOLIC PANEL, COMPREHENSIVE      5. Acquired hypothyroidism   -     TSH 3RD GENERATION      6. Osteoporosis, unspecified osteoporosis type, unspecified pathological fracture presence      7. Hypercholesteremia   -     LIPID PANEL      8. Chronic obstructive pulmonary disease, unspecified COPD type ([place])      Other orders   -     mupirocin (BACTROBAN) 2 % ointment; Apply  to affected area daily.  Health Maintenance Due        Topic  Date Due         ?  DTaP/Tdap/Td series (1 - Tdap)  08/07/1960     ?  GLAUCOMA SCREENING Q2Y   08/07/2004         ?  Medicare Yearly Exam   12/03/2018

## 2019-01-05 LAB — CBC
Hematocrit: 42.7 % (ref 34.0–46.6)
Hemoglobin: 14.1 g/dL (ref 11.1–15.9)
MCH: 32.6 pg (ref 26.6–33.0)
MCHC: 33 g/dL (ref 31.5–35.7)
MCV: 99 fL — ABNORMAL HIGH (ref 79–97)
Platelets: 241 10*3/uL (ref 150–450)
RBC: 4.32 x10E6/uL (ref 3.77–5.28)
RDW: 13.6 % (ref 11.7–15.4)
WBC: 8 10*3/uL (ref 3.4–10.8)

## 2019-01-05 LAB — COMPREHENSIVE METABOLIC PANEL
ALT: 13 IU/L (ref 0–32)
AST: 20 IU/L (ref 0–40)
Albumin/Globulin Ratio: 2.2 NA (ref 1.2–2.2)
Albumin: 4.7 g/dL (ref 3.7–4.7)
Alkaline Phosphatase: 44 IU/L (ref 39–117)
BUN: 13 mg/dL (ref 8–27)
Bun/Cre Ratio: 17 NA (ref 12–28)
CO2: 23 mmol/L (ref 20–29)
Calcium: 9.1 mg/dL (ref 8.7–10.3)
Chloride: 100 mmol/L (ref 96–106)
Creatinine: 0.76 mg/dL (ref 0.57–1.00)
EGFR IF NonAfrican American: 75 mL/min/{1.73_m2} (ref >59)
GFR African American: 86 mL/min/{1.73_m2} (ref >59)
Globulin, Total: 2.1 g/dL (ref 1.5–4.5)
Glucose: 116 mg/dL — ABNORMAL HIGH (ref 65–99)
Potassium: 4.3 mmol/L (ref 3.5–5.2)
Sodium: 139 mmol/L (ref 134–144)
Total Bilirubin: 0.9 mg/dL (ref 0.0–1.2)
Total Protein: 6.8 g/dL (ref 6.0–8.5)

## 2019-01-05 LAB — LIPID PANEL
Cholesterol, Total: 250 mg/dL — ABNORMAL HIGH (ref 100–199)
Cholesterol, total: 250 mg/dL — ABNORMAL HIGH (ref 100–199)
HDL Cholesterol: 67 mg/dL (ref >39)
HDL: 67 mg/dL (ref >39)
LDL Calculated: 167 mg/dL — ABNORMAL HIGH (ref 0–99)
LDL, calculated: 167 mg/dL — ABNORMAL HIGH (ref 0–99)
Triglyceride: 78 mg/dL (ref 0–149)
Triglycerides: 78 mg/dL (ref 0–149)
VLDL Cholesterol Calculated: 16 mg/dL (ref 5–40)
VLDL, calculated: 16 mg/dL (ref 5–40)

## 2019-01-05 LAB — TSH 3RD GENERATION
TSH: 1.23 u[IU]/mL (ref 0.450–4.500)
TSH: 1.23 u[IU]/mL (ref 0.450–4.500)

## 2019-01-05 LAB — METABOLIC PANEL, COMPREHENSIVE
A-G Ratio: 2.2 (ref 1.2–2.2)
ALT (SGPT): 13 IU/L (ref 0–32)
AST (SGOT): 20 IU/L (ref 0–40)
Albumin: 4.7 g/dL (ref 3.7–4.7)
Alk. phosphatase: 44 IU/L (ref 39–117)
BUN/Creatinine ratio: 17 (ref 12–28)
BUN: 13 mg/dL (ref 8–27)
Bilirubin, total: 0.9 mg/dL (ref 0.0–1.2)
CO2: 23 mmol/L (ref 20–29)
Calcium: 9.1 mg/dL (ref 8.7–10.3)
Chloride: 100 mmol/L (ref 96–106)
Creatinine: 0.76 mg/dL (ref 0.57–1.00)
GFR est AA: 86 mL/min/{1.73_m2} (ref >59)
GFR est non-AA: 75 mL/min/{1.73_m2} (ref >59)
GLOBULIN, TOTAL: 2.1 g/dL (ref 1.5–4.5)
Glucose: 116 mg/dL — ABNORMAL HIGH (ref 65–99)
Potassium: 4.3 mmol/L (ref 3.5–5.2)
Protein, total: 6.8 g/dL (ref 6.0–8.5)
Sodium: 139 mmol/L (ref 134–144)

## 2019-01-05 LAB — CBC W/O DIFF
HCT: 42.7 % (ref 34.0–46.6)
HGB: 14.1 g/dL (ref 11.1–15.9)
MCH: 32.6 pg (ref 26.6–33.0)
MCHC: 33 g/dL (ref 31.5–35.7)
MCV: 99 fL — ABNORMAL HIGH (ref 79–97)
PLATELET: 241 10*3/uL (ref 150–450)
RBC: 4.32 x10E6/uL (ref 3.77–5.28)
RDW: 13.6 % (ref 11.7–15.4)
WBC: 8 10*3/uL (ref 3.4–10.8)

## 2019-01-05 MED ORDER — ALBUTEROL SULFATE HFA 90 MCG/ACTUATION AEROSOL INHALER
90 mcg/actuation | Freq: Four times a day (QID) | RESPIRATORY_TRACT | 5 refills | Status: DC | PRN
Start: 2019-01-05 — End: 2019-07-05

## 2019-01-06 ENCOUNTER — Inpatient Hospital Stay: Admit: 2019-01-06 | Payer: MEDICARE | Attending: Internal Medicine | Primary: Internal Medicine

## 2019-01-06 DIAGNOSIS — Z1231 Encounter for screening mammogram for malignant neoplasm of breast: Secondary | ICD-10-CM

## 2019-01-06 NOTE — Progress Notes (Signed)
Mammogram negative  Copy to pt

## 2019-02-10 MED ORDER — LEVOTHYROXINE 100 MCG TAB
100 mcg | ORAL_TABLET | Freq: Every day | ORAL | 11 refills | Status: DC
Start: 2019-02-10 — End: 2020-01-24

## 2019-02-11 NOTE — Telephone Encounter (Signed)
Pt called on 02/10/19 with c/o back and hip pain. Requesting a muscle relaxer. I have left messages on 02/10/19 and 02/11/19 to see if patient needs an OV here or has an upcoming OV with Dr Debbra Riding. Awaiting on pt to call back. Dr Reed Breech aware.

## 2019-04-05 MED ORDER — FLUTICASONE 50 MCG/ACTUATION NASAL SPRAY, SUSP
50 mcg/actuation | Freq: Every day | NASAL | 5 refills | Status: AC
Start: 2019-04-05 — End: ?

## 2019-07-05 ENCOUNTER — Ambulatory Visit: Attending: Internal Medicine | Primary: Internal Medicine

## 2019-07-05 ENCOUNTER — Ambulatory Visit: Admit: 2019-07-05 | Discharge: 2019-07-05 | Payer: MEDICARE | Attending: Internal Medicine | Primary: Internal Medicine

## 2019-07-05 DIAGNOSIS — M069 Rheumatoid arthritis, unspecified: Secondary | ICD-10-CM

## 2019-07-05 MED ORDER — ALBUTEROL SULFATE HFA 90 MCG/ACTUATION AEROSOL INHALER
90 mcg/actuation | Freq: Four times a day (QID) | RESPIRATORY_TRACT | 5 refills | Status: AC | PRN
Start: 2019-07-05 — End: ?

## 2019-07-05 MED ORDER — LOSARTAN 100 MG TAB
100 mg | ORAL_TABLET | ORAL | 3 refills | Status: DC
Start: 2019-07-05 — End: 2020-07-29

## 2019-07-05 NOTE — Progress Notes (Signed)
HPI: Audrey Snow (DOB: [birth year])   Need to refill meds    Update labs    Had Echo in Nov and normal sys function    Still seeing Dr. Debbra Riding    Pulled her lower back on Christmas and still with some pain with movement    Problem List:  Patient Active Problem List   Diagnosis Code   ??? Rheumatoid arthritis ([place]) M06.9   ??? Hypothyroidism E03.9   ??? Vitamin D deficiency E55.9   ??? Osteoporosis M81.0   ??? Gastroesophageal reflux disease without esophagitis K21.9   ??? HTN (hypertension), benign I10   ??? Environmental allergies Z91.09   ??? 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 Prediabetes R73.03   ??? Chronic pain of left knee M25.562, G89.29   ??? 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 Hypercholesteremia E78.00   ??? Chronic obstructive pulmonary disease (HCC) J44.9       History:  Past Medical History:   Diagnosis Date   ??? Autoimmune disease ([place])     RA   ??? Chronic obstructive pulmonary disease ([place])     very mild per pt.   ??? Chronic pain of left knee 06/25/2018   ??? GERD (gastroesophageal reflux disease)     controlled w/med   ??? H/O mammogram 07/28/2016    mammogram and Ultrasound of right breaset- benign finding- [facility]    ??? Hearing loss of both ears    ??? HTN (hypertension), benign 01/30/2014    controlled w/med   ??? Hypothyroid     on med for control   ??? Menopause    ??? Osteoarthritis    ??? Osteoporosis    ??? 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 Debbra Riding. Methotrexate Rx   ??? Sinusitis    ??? Unspecified hypothyroidism 01/30/2014   ??? Vitamin D deficiency        Allergies:  No Known Allergies    Current Medications:  Current Outpatient Medications   Medication Sig Dispense Refill   ??? albuterol (PROVENTIL HFA, VENTOLIN HFA, PROAIR HFA) 90 mcg/actuation inhaler Take 1 Puff by inhalation every six (6) hours as needed for Wheezing. 1 Inhaler 5   ??? losartan (COZAAR) 100 mg tablet TAKE 1 TABLET BY MOUTH DAILY 90 Tab 3    ??? fluticasone propionate (FLONASE) 50 mcg/actuation nasal spray 2 Sprays by Both Nostrils route daily. 1 Bottle 5   ??? levothyroxine (SYNTHROID) 100 mcg tablet Take 1 Tab by mouth Daily (before breakfast). 30 Tab 11   ??? mupirocin (BACTROBAN) 2 % ointment Apply  to affected area daily. 15 g 1   ??? bacitracin-neomycin-polymyxin b-hydrocortisone (CORTISPORIN) 1 % ointment Use samll amount 3-4 times a day. 15 g 5   ??? denosumab (PROLIA) 60 mg/mL injection 60 mg by SubCUTAneous route.     ??? Cholecalciferol, Vitamin D3, 50,000 unit cap Take 1 Cap by mouth every Monday.     ??? methotrexate, PF, 25 mg/mL injection 25 mg every seven (7) days.     ??? nicotinic acid (NIACIN) 500 mg tablet Take 500 mg by mouth Daily (before breakfast). 2 po everyday.     ??? calcium-cholecalciferol, d3, (CALCIUM 600 + D) 600-125 mg-unit tab Take 2 Tabs by mouth.     ??? multivitamin (ONE A DAY) tablet Take 1 Tab by mouth daily.     ??? folic acid (FOLVITE) 1 mg tablet Take  by mouth daily.  Review of Systems:  Review of Systems   Constitutional: Positive for weight loss.   HENT: Positive for hearing loss.    Respiratory: Negative for shortness of breath.    Cardiovascular: Positive for palpitations. Negative for chest pain.   Musculoskeletal: Positive for back pain and joint pain.   All other systems reviewed and are negative.      Vitals:  Visit Vitals  BP 108/80   Temp 97.2 ??F (36.2 ??C)   Ht 5\' 6"  (1.676 m)   Wt 160 lb 3.2 oz (72.7 kg)   BMI 25.86 kg/m??       Physical Exam:  Physical Exam  Vitals signs reviewed.   Constitutional:       Appearance: Normal appearance. She is normal weight.   HENT:      Head: Normocephalic and atraumatic.      Ears:      Comments: Very HOH  Neck:      Musculoskeletal: Normal range of motion and neck supple.   Cardiovascular:      Rate and Rhythm: Normal rate. Rhythm irregular.   Pulmonary:      Effort: Pulmonary effort is normal.      Breath sounds: Normal breath sounds.    Musculoskeletal: Normal range of motion.         General: Tenderness present.      Comments: Mild tenderness of lower back to palpation- muscular tenderness  ambulatory   Skin:     General: Skin is warm and dry.   Neurological:      General: No focal deficit present.      Mental Status: She is alert and oriented to person, place, and time.   Psychiatric:         Mood and Affect: Mood normal.         Behavior: Behavior normal.         Thought Content: Thought content normal.         Judgment: Judgment normal.         Assessment/Plan:   Diagnoses and all orders for this visit:    1. Rheumatoid arthritis, involving unspecified site, unspecified whether rheumatoid factor present ([place])  -     CBC W/O DIFF  -     METABOLIC PANEL, COMPREHENSIVE  Follow with Rheum  2. Acquired hypothyroidism  -     TSH 3RD GENERATION  Update labs  3. Hypercholesteremia  -     LIPID PANEL    4. HTN (hypertension), benign  -     METABOLIC PANEL, COMPREHENSIVE  -     AMB POC EKG ROUTINE W/ 12 LEADS, INTER & REP  Stable on present meds  5. Elevated blood sugar  -     HEMOGLOBIN A1C WITH EAG  Watch diet  6. Acute bilateral low back pain without sciatica  Recommend moist heat and Icy Hot- topical  7. Asymptomatic PVCs  -     AMB POC EKG ROUTINE W/ 12 LEADS, INTER & REP - EKG did not show PVC's that were noted on exam  Sinus rhythm with rate of 62 and nonspec ST T wave changes  Had Echo but will update EKG  Other orders  -     albuterol (PROVENTIL HFA, VENTOLIN HFA, PROAIR HFA) 90 mcg/actuation inhaler; Take 1 Puff by inhalation every six (6) hours as needed for Wheezing.  -     losartan (COZAAR) 100 mg tablet; TAKE 1 TABLET BY MOUTH DAILY  Audrey Reedy, MD

## 2019-07-05 NOTE — Progress Notes (Signed)
Progress Notes by [name], MD at 07/05/19 1040                Author: [name], MD  Service: --  Author Type: Physician       Filed: 07/05/19 1352  Encounter Date: 07/05/2019  Status: Signed          Editor: [name], MD (Physician)                       HPI: Audrey Snow (DOB: Sep 26, 1940 )    Need to refill meds      Update labs      Had Echo in Nov and normal sys function      Still seeing Dr. Debbra Riding      Pulled her lower back on Christmas and still with some pain with movement      Problem List:     Patient Active Problem List        Diagnosis  Code         ?  Rheumatoid arthritis ([place])  M06.9     ?  Hypothyroidism  E03.9     ?  Vitamin D deficiency  E55.9     ?  Osteoporosis  M81.0     ?  Gastroesophageal reflux disease without esophagitis  K21.9     ?  HTN (hypertension), benign  I10     ?  Environmental allergies  Z91.09     ?  Chronic right-sided low back pain without sciatica  M54.5, G89.29     ?  Prediabetes  R73.03     ?  Chronic pain of left knee  M25.562, G89.29     ?  Pre-ulcerative corn or callous  L84     ?  Hypercholesteremia  E78.00         ?  Chronic obstructive pulmonary disease (HCC)  J44.9           History:     Past Medical History:        Diagnosis  Date         ?  Autoimmune disease ([place])            RA         ?  Chronic obstructive pulmonary disease ([place])            very mild per pt.         ?  Chronic pain of left knee  06/25/2018     ?  GERD (gastroesophageal reflux disease)            controlled w/med         ?  H/O mammogram  07/28/2016          mammogram and Ultrasound of right breaset- benign finding- [facility]          ?  Hearing loss of both ears       ?  HTN (hypertension), benign  01/30/2014          controlled w/med         ?  Hypothyroid            on med for control         ?  Menopause       ?  Osteoarthritis       ?  Osteoporosis       ?  Rheumatoid arthritis(714.0)  01/30/2014  followed by Dr. Debbra Riding. Methotrexate Rx         ?   Sinusitis       ?  Unspecified hypothyroidism  01/30/2014         ?  Vitamin D deficiency             Allergies:   No Known Allergies      Current Medications:     Current Outpatient Medications          Medication  Sig  Dispense  Refill           ?  albuterol (PROVENTIL HFA, VENTOLIN HFA, PROAIR HFA) 90 mcg/actuation inhaler  Take 1 Puff by inhalation every six (6) hours as needed for Wheezing.  1 Inhaler  5     ?  losartan (COZAAR) 100 mg tablet  TAKE 1 TABLET BY MOUTH DAILY  90 Tab  3     ?  fluticasone propionate (FLONASE) 50 mcg/actuation nasal spray  2 Sprays by Both Nostrils route daily.  1 Bottle  5     ?  levothyroxine (SYNTHROID) 100 mcg tablet  Take 1 Tab by mouth Daily (before breakfast).  30 Tab  11     ?  mupirocin (BACTROBAN) 2 % ointment  Apply  to affected area daily.  15 g  1     ?  bacitracin-neomycin-polymyxin b-hydrocortisone (CORTISPORIN) 1 % ointment  Use samll amount 3-4 times a day.  15 g  5     ?  denosumab (PROLIA) 60 mg/mL injection  60 mg by SubCUTAneous route.         ?  Cholecalciferol, Vitamin D3, 50,000 unit cap  Take 1 Cap by mouth every Monday.         ?  methotrexate, PF, 25 mg/mL injection  25 mg every seven (7) days.         ?  nicotinic acid (NIACIN) 500 mg tablet  Take 500 mg by mouth Daily (before breakfast). 2 po everyday.         ?  calcium-cholecalciferol, d3, (CALCIUM 600 + D) 600-125 mg-unit tab  Take 2 Tabs by mouth.         ?  multivitamin (ONE A DAY) tablet  Take 1 Tab by mouth daily.               ?  folic acid (FOLVITE) 1 mg tablet  Take  by mouth daily.               Review of Systems:   Review of Systems    Constitutional: Positive for weight loss.    HENT: Positive for hearing loss.     Respiratory: Negative for shortness of breath.     Cardiovascular: Positive for palpitations. Negative for chest pain.    Musculoskeletal: Positive for back pain and joint pain .    All other systems reviewed and are negative.         Vitals:   Visit Vitals      BP  108/80     Temp   97.2 ??F (36.2 ??C)     Ht  5\' 6"  (1.676 m)     Wt  160 lb 3.2 oz (72.7 kg)        BMI  25.86 kg/m??           Physical Exam:   Physical Exam   Vitals signs reviewed.   Constitutional:        Appearance: Normal  appearance. She is normal weight.   HENT :       Head: Normocephalic and atraumatic.      Ears:      Comments: Very HOH  Neck :       Musculoskeletal: Normal range of motion and neck supple.   Cardiovascular :       Rate and Rhythm: Normal rate. Rhythm irregular.   Pulmonary:       Effort: Pulmonary effort is normal.      Breath sounds: Normal breath sounds.   Musculoskeletal : Normal range of motion.          General: Tenderness present.      Comments: Mild tenderness of lower back  to palpation- muscular tenderness  ambulatory    Skin:      General: Skin is warm and dry.   Neurological :       General: No focal deficit present.      Mental Status: She is alert and oriented to person, place, and time.    Psychiatric:         Mood and Affect: Mood normal.         Behavior: Behavior normal.         Thought Content: Thought content normal.         Judgment: Judgment normal.             Assessment/Plan:    Diagnoses and all orders for this visit:      1. Rheumatoid arthritis, involving unspecified site, unspecified whether rheumatoid factor present ([place])   -     CBC W/O DIFF   -     METABOLIC PANEL, COMPREHENSIVE   Follow with Rheum   2. Acquired hypothyroidism   -     TSH 3RD GENERATION   Update labs   3. Hypercholesteremia   -     LIPID PANEL      4. HTN (hypertension), benign   -     METABOLIC PANEL, COMPREHENSIVE   -     AMB POC EKG ROUTINE W/ 12 LEADS, INTER & REP   Stable on present meds   5. Elevated blood sugar   -     HEMOGLOBIN A1C WITH EAG   Watch diet   6. Acute bilateral low back pain without sciatica   Recommend moist heat and Icy Hot- topical   7. Asymptomatic PVCs   -     AMB POC EKG ROUTINE W/ 12 LEADS, INTER & REP - EKG did not show PVC's that were noted on exam   Sinus rhythm with rate of 62 and  nonspec ST T wave changes   Had Echo but will update EKG   Other orders   -     albuterol (PROVENTIL HFA, VENTOLIN HFA, PROAIR HFA) 90 mcg/actuation inhaler; Take 1 Puff by inhalation every six (6) hours as needed for Wheezing.   -     losartan (COZAAR) 100 mg tablet; TAKE 1 TABLET BY MOUTH DAILY            [name], MD

## 2019-07-05 NOTE — Progress Notes (Signed)
Labs are stable  Still needs to work on keeping her cholesterol and blood sugar under control with diet  Continue all medications  Copy to pt

## 2019-07-06 LAB — CBC
Hematocrit: 40.7 % (ref 34.0–46.6)
Hemoglobin: 13.9 g/dL (ref 11.1–15.9)
MCH: 32.6 pg (ref 26.6–33.0)
MCHC: 34.2 g/dL (ref 31.5–35.7)
MCV: 95 fL (ref 79–97)
Platelets: 277 10*3/uL (ref 150–450)
RBC: 4.27 x10E6/uL (ref 3.77–5.28)
RDW: 12.9 % (ref 11.7–15.4)
WBC: 7.6 10*3/uL (ref 3.4–10.8)

## 2019-07-06 LAB — COMPREHENSIVE METABOLIC PANEL
ALT: 21 IU/L (ref 0–32)
AST: 22 IU/L (ref 0–40)
Albumin/Globulin Ratio: 2 NA (ref 1.2–2.2)
Albumin: 4.6 g/dL (ref 3.7–4.7)
Alkaline Phosphatase: 55 IU/L (ref 39–117)
BUN: 11 mg/dL (ref 8–27)
Bun/Cre Ratio: 16 NA (ref 12–28)
CO2: 22 mmol/L (ref 20–29)
Calcium: 9.4 mg/dL (ref 8.7–10.3)
Chloride: 100 mmol/L (ref 96–106)
Creatinine: 0.67 mg/dL (ref 0.57–1.00)
EGFR IF NonAfrican American: 84 mL/min/{1.73_m2} (ref >59)
GFR African American: 97 mL/min/{1.73_m2} (ref >59)
Globulin, Total: 2.3 g/dL (ref 1.5–4.5)
Glucose: 95 mg/dL (ref 65–99)
Potassium: 4.5 mmol/L (ref 3.5–5.2)
Sodium: 138 mmol/L (ref 134–144)
Total Bilirubin: 0.6 mg/dL (ref 0.0–1.2)
Total Protein: 6.9 g/dL (ref 6.0–8.5)

## 2019-07-06 LAB — LIPID PANEL
Cholesterol, Total: 241 mg/dL — ABNORMAL HIGH (ref 100–199)
Cholesterol, total: 241 mg/dL — ABNORMAL HIGH (ref 100–199)
HDL Cholesterol: 59 mg/dL (ref >39)
HDL: 59 mg/dL (ref >39)
LDL Calculated: 165 mg/dL — ABNORMAL HIGH (ref 0–99)
LDL, calculated: 165 mg/dL — ABNORMAL HIGH (ref 0–99)
Triglyceride: 97 mg/dL (ref 0–149)
Triglycerides: 97 mg/dL (ref 0–149)
VLDL, calculated: 17 mg/dL (ref 5–40)
VLDL: 17 mg/dL (ref 5–40)

## 2019-07-06 LAB — TSH 3RD GENERATION
TSH: 1.89 u[IU]/mL (ref 0.450–4.500)
TSH: 1.89 u[IU]/mL (ref 0.450–4.500)

## 2019-07-06 LAB — HEMOGLOBIN A1C W/EAG
Hemoglobin A1C: 6.1 % — ABNORMAL HIGH (ref 4.8–5.6)
eAG: 128 mg/dL

## 2019-07-06 LAB — CBC W/O DIFF
HCT: 40.7 % (ref 34.0–46.6)
HGB: 13.9 g/dL (ref 11.1–15.9)
MCH: 32.6 pg (ref 26.6–33.0)
MCHC: 34.2 g/dL (ref 31.5–35.7)
MCV: 95 fL (ref 79–97)
PLATELET: 277 10*3/uL (ref 150–450)
RBC: 4.27 x10E6/uL (ref 3.77–5.28)
RDW: 12.9 % (ref 11.7–15.4)
WBC: 7.6 10*3/uL (ref 3.4–10.8)

## 2019-07-06 LAB — METABOLIC PANEL, COMPREHENSIVE
A-G Ratio: 2 (ref 1.2–2.2)
ALT (SGPT): 21 IU/L (ref 0–32)
AST (SGOT): 22 IU/L (ref 0–40)
Albumin: 4.6 g/dL (ref 3.7–4.7)
Alk. phosphatase: 55 IU/L (ref 39–117)
BUN/Creatinine ratio: 16 (ref 12–28)
BUN: 11 mg/dL (ref 8–27)
Bilirubin, total: 0.6 mg/dL (ref 0.0–1.2)
CO2: 22 mmol/L (ref 20–29)
Calcium: 9.4 mg/dL (ref 8.7–10.3)
Chloride: 100 mmol/L (ref 96–106)
Creatinine: 0.67 mg/dL (ref 0.57–1.00)
GFR est AA: 97 mL/min/{1.73_m2} (ref >59)
GFR est non-AA: 84 mL/min/{1.73_m2} (ref >59)
GLOBULIN, TOTAL: 2.3 g/dL (ref 1.5–4.5)
Glucose: 95 mg/dL (ref 65–99)
Potassium: 4.5 mmol/L (ref 3.5–5.2)
Protein, total: 6.9 g/dL (ref 6.0–8.5)
Sodium: 138 mmol/L (ref 134–144)

## 2019-07-06 LAB — HEMOGLOBIN A1C WITH EAG
Estimated average glucose: 128 mg/dL
Hemoglobin A1c: 6.1 % — ABNORMAL HIGH (ref 4.8–5.6)

## 2019-10-13 ENCOUNTER — Ambulatory Visit: Attending: Internal Medicine | Primary: Internal Medicine

## 2019-10-13 ENCOUNTER — Ambulatory Visit: Admit: 2019-10-13 | Discharge: 2019-10-13 | Payer: MEDICARE | Attending: Internal Medicine | Primary: Internal Medicine

## 2019-10-13 DIAGNOSIS — M069 Rheumatoid arthritis, unspecified: Secondary | ICD-10-CM

## 2019-10-13 NOTE — Progress Notes (Signed)
HPI: Audrey Snow (DOB: [birth year])    Having more CP, SOB esp with exertion and palpitations since NOV    Has had an Echo, PFT's, EKG and CXR in NOV/DEC in labs but states she thinks SOB is worse    Due to see Pulm on 4/22 and may need f/u Chest CT     UTD on OV with Dr. Debbra Riding  Problem List:  Patient Active Problem List   Diagnosis Code   ??? Rheumatoid arthritis ([place]) M06.9   ??? Hypothyroidism E03.9   ??? Vitamin D deficiency E55.9   ??? Osteoporosis M81.0   ??? Gastroesophageal reflux disease without esophagitis K21.9   ??? HTN (hypertension), benign I10   ??? Environmental allergies Z91.09   ??? 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 Prediabetes R73.03   ??? Chronic pain of left knee M25.562, G89.29   ??? 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 Hypercholesteremia E78.00   ??? Chronic obstructive pulmonary disease (HCC) J44.9       History:  Past Medical History:   Diagnosis Date   ??? Autoimmune disease ([place])     RA   ??? Chronic obstructive pulmonary disease ([place])     very mild per pt.   ??? Chronic pain of left knee 06/25/2018   ??? GERD (gastroesophageal reflux disease)     controlled w/med   ??? H/O mammogram 07/28/2016    mammogram and Ultrasound of right breaset- benign finding- [facility]    ??? Hearing loss of both ears    ??? HTN (hypertension), benign 01/30/2014    controlled w/med   ??? Hypothyroid     on med for control   ??? Menopause    ??? Osteoarthritis    ??? Osteoporosis    ??? 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 Debbra Riding. Methotrexate Rx   ??? Sinusitis    ??? Unspecified hypothyroidism 01/30/2014   ??? Vitamin D deficiency        Allergies:  No Known Allergies    Current Medications:  Current Outpatient Medications   Medication Sig Dispense Refill   ??? gabapentin (NEURONTIN) 100 mg capsule Take 100 mg by mouth three (3) times daily.     ??? albuterol (PROVENTIL HFA, VENTOLIN HFA, PROAIR HFA) 90 mcg/actuation inhaler Take 1 Puff by inhalation every six (6) hours as needed for Wheezing. 1 Inhaler 5   ???  losartan (COZAAR) 100 mg tablet TAKE 1 TABLET BY MOUTH DAILY 90 Tab 3   ??? fluticasone propionate (FLONASE) 50 mcg/actuation nasal spray 2 Sprays by Both Nostrils route daily. 1 Bottle 5   ??? levothyroxine (SYNTHROID) 100 mcg tablet Take 1 Tab by mouth Daily (before breakfast). 30 Tab 11   ??? mupirocin (BACTROBAN) 2 % ointment Apply  to affected area daily. 15 g 1   ??? bacitracin-neomycin-polymyxin b-hydrocortisone (CORTISPORIN) 1 % ointment Use samll amount 3-4 times a day. 15 g 5   ??? denosumab (PROLIA) 60 mg/mL injection 60 mg by SubCUTAneous route.     ??? Cholecalciferol, Vitamin D3, 50,000 unit cap Take 1 Cap by mouth every Monday.     ??? methotrexate, PF, 25 mg/mL injection 25 mg every seven (7) days.     ??? nicotinic acid (NIACIN) 500 mg tablet Take 500 mg by mouth Daily (before breakfast). 2 po everyday.     ??? calcium-cholecalciferol, d3, (CALCIUM 600 + D) 600-125 mg-unit tab Take 2 Tabs by mouth.     ??? multivitamin (ONE  A DAY) tablet Take 1 Tab by mouth daily.     ??? folic acid (FOLVITE) 1 mg tablet Take  by mouth daily.         Review of Systems:  Review of Systems   Constitutional: Positive for weight loss.   HENT: Positive for hearing loss.    Respiratory: Positive for shortness of breath.    Cardiovascular: Positive for chest pain and palpitations.   All other systems reviewed and are negative.      Vitals:  Visit Vitals  BP 124/60   Pulse 92   Temp 97.1 ??F (36.2 ??C)   Ht 5\' 6"  (1.676 m)   Wt 157 lb 3.2 oz (71.3 kg)   SpO2 95%   BMI 25.37 kg/m??       Physical Exam:  Physical Exam  Vitals signs reviewed.   Constitutional:       Appearance: Normal appearance.   HENT:      Head: Normocephalic and atraumatic.   Neck:      Musculoskeletal: Normal range of motion and neck supple.   Cardiovascular:      Rate and Rhythm: Normal rate. Rhythm irregular.      Heart sounds: Normal heart sounds.   Pulmonary:      Effort: Pulmonary effort is normal.      Comments: Diminished BS bilateral - no wheezing  Musculoskeletal: Normal  range of motion.   Skin:     General: Skin is warm and dry.   Neurological:      General: No focal deficit present.      Mental Status: She is alert and oriented to person, place, and time.   Psychiatric:         Mood and Affect: Mood normal.         Behavior: Behavior normal.         Thought Content: Thought content normal.         Judgment: Judgment normal.         Assessment/Plan:   Diagnoses and all orders for this visit:    1. Rheumatoid arthritis, involving unspecified site, unspecified whether rheumatoid factor present ([place])  Recently had updated eval with Dr. Debbra Riding  2. Chronic obstructive pulmonary disease, unspecified COPD type ([place])  Continue inhalers for now and may need Chest CT  3. Chest pain, unspecified type  -     AMB POC EKG ROUTINE W/ 12 LEADS, INTER & REP - sinus rhythm rate of 92 with frequent PVC's and poor R wave progression anterior    Has PVC's on EKG that she is feeling- had an Echo in NOV and has normal systolic function  Needs a f/u OV with [facility]- Dr. Gerilyn Nestle for further eval  4. SOB (shortness of breath)  Keep sch OV with Pulmonary  5. HTN (hypertension), benign  -     AMB POC EKG ROUTINE W/ 12 LEADS, INTER & REP  BP stable        Current medications are therapeutic at this time; continue as prescribed.        [name], MD

## 2019-10-13 NOTE — Progress Notes (Signed)
Progress Notes by [name], MD at 10/13/19 1500                Author: [name], MD  Service: --  Author Type: Physician       Filed: 10/13/19 1554  Encounter Date: 10/13/2019  Status: Signed          Editor: [name], MD (Physician)                       HPI: Audrey Snow (DOB: June 06, 1941 )      Having more CP, SOB esp with exertion and palpitations since NOV      Has had an Echo, PFT's, EKG and CXR in NOV/DEC in labs but states she thinks SOB is worse      Due to see Pulm on 4/22 and may need f/u Chest CT       UTD on OV with Dr. Debbra Riding   Problem List:     Patient Active Problem List        Diagnosis  Code         ?  Rheumatoid arthritis ([place])  M06.9     ?  Hypothyroidism  E03.9     ?  Vitamin D deficiency  E55.9     ?  Osteoporosis  M81.0     ?  Gastroesophageal reflux disease without esophagitis  K21.9     ?  HTN (hypertension), benign  I10     ?  Environmental allergies  Z91.09     ?  Chronic right-sided low back pain without sciatica  M54.5, G89.29     ?  Prediabetes  R73.03     ?  Chronic pain of left knee  M25.562, G89.29     ?  Pre-ulcerative corn or callous  L84     ?  Hypercholesteremia  E78.00         ?  Chronic obstructive pulmonary disease (HCC)  J44.9           History:     Past Medical History:        Diagnosis  Date         ?  Autoimmune disease ([place])            RA         ?  Chronic obstructive pulmonary disease ([place])            very mild per pt.         ?  Chronic pain of left knee  06/25/2018     ?  GERD (gastroesophageal reflux disease)            controlled w/med         ?  H/O mammogram  07/28/2016          mammogram and Ultrasound of right breaset- benign finding- [facility]          ?  Hearing loss of both ears       ?  HTN (hypertension), benign  01/30/2014          controlled w/med         ?  Hypothyroid            on med for control         ?  Menopause       ?  Osteoarthritis       ?  Osteoporosis       ?  Rheumatoid arthritis(714.0)  01/30/2014           followed by Dr. Debbra Riding. Methotrexate Rx         ?  Sinusitis       ?  Unspecified hypothyroidism  01/30/2014         ?  Vitamin D deficiency             Allergies:   No Known Allergies      Current Medications:     Current Outpatient Medications          Medication  Sig  Dispense  Refill           ?  gabapentin (NEURONTIN) 100 mg capsule  Take 100 mg by mouth three (3) times daily.         ?  albuterol (PROVENTIL HFA, VENTOLIN HFA, PROAIR HFA) 90 mcg/actuation inhaler  Take 1 Puff by inhalation every six (6) hours as needed for Wheezing.  1 Inhaler  5     ?  losartan (COZAAR) 100 mg tablet  TAKE 1 TABLET BY MOUTH DAILY  90 Tab  3     ?  fluticasone propionate (FLONASE) 50 mcg/actuation nasal spray  2 Sprays by Both Nostrils route daily.  1 Bottle  5     ?  levothyroxine (SYNTHROID) 100 mcg tablet  Take 1 Tab by mouth Daily (before breakfast).  30 Tab  11     ?  mupirocin (BACTROBAN) 2 % ointment  Apply  to affected area daily.  15 g  1     ?  bacitracin-neomycin-polymyxin b-hydrocortisone (CORTISPORIN) 1 % ointment  Use samll amount 3-4 times a day.  15 g  5     ?  denosumab (PROLIA) 60 mg/mL injection  60 mg by SubCUTAneous route.         ?  Cholecalciferol, Vitamin D3, 50,000 unit cap  Take 1 Cap by mouth every Monday.         ?  methotrexate, PF, 25 mg/mL injection  25 mg every seven (7) days.         ?  nicotinic acid (NIACIN) 500 mg tablet  Take 500 mg by mouth Daily (before breakfast). 2 po everyday.         ?  calcium-cholecalciferol, d3, (CALCIUM 600 + D) 600-125 mg-unit tab  Take 2 Tabs by mouth.         ?  multivitamin (ONE A DAY) tablet  Take 1 Tab by mouth daily.               ?  folic acid (FOLVITE) 1 mg tablet  Take  by mouth daily.               Review of Systems:   Review of Systems    Constitutional: Positive for weight loss.    HENT: Positive for hearing loss.     Respiratory: Positive for shortness of breath.     Cardiovascular: Positive for chest pain and palpitations .    All other systems  reviewed and are negative.         Vitals:   Visit Vitals      BP  124/60     Pulse  92     Temp  97.1 ??F (36.2 ??C)     Ht  5\' 6"  (1.676 m)     Wt  157 lb 3.2 oz (71.3 kg)     SpO2  95%  BMI  25.37 kg/m??           Physical Exam:   Physical Exam   Vitals signs reviewed.   Constitutional:        Appearance: Normal appearance.   HENT :       Head: Normocephalic and atraumatic.   Neck:       Musculoskeletal: Normal range of motion and neck supple.   Cardiovascular :       Rate and Rhythm: Normal rate. Rhythm irregular.      Heart sounds: Normal heart sounds.   Pulmonary:       Effort: Pulmonary effort is normal.      Comments: Diminished BS bilateral - no wheezing  Musculoskeletal : Normal range of motion.    Skin:      General: Skin is warm and dry.   Neurological :       General: No focal deficit present.      Mental Status: She is alert and oriented to person, place, and time.    Psychiatric:         Mood and Affect: Mood normal.         Behavior: Behavior normal.         Thought Content: Thought content normal.         Judgment: Judgment normal.             Assessment/Plan:    Diagnoses and all orders for this visit:      1. Rheumatoid arthritis, involving unspecified site, unspecified whether rheumatoid factor present ([place])   Recently had updated eval with Dr. Debbra Riding   2. Chronic obstructive pulmonary disease, unspecified COPD type ([place])   Continue inhalers for now and may need Chest CT   3. Chest pain, unspecified type   -     AMB POC EKG ROUTINE W/ 12 LEADS, INTER & REP - sinus rhythm rate of 92 with frequent PVC's and poor R wave progression  anterior      Has PVC's on EKG that she is feeling- had an Echo in NOV and has normal systolic function   Needs a f/u OV with [facility]- Dr. Gerilyn Nestle for further eval   4. SOB (shortness of breath)   Keep sch OV with Pulmonary   5. HTN (hypertension), benign   -     AMB POC EKG ROUTINE W/ 12 LEADS, INTER & REP   BP stable            Current medications are  therapeutic at this time; continue as prescribed.            [name], MD

## 2020-01-03 ENCOUNTER — Ambulatory Visit: Attending: Internal Medicine | Primary: Internal Medicine

## 2020-01-03 ENCOUNTER — Ambulatory Visit: Admit: 2020-01-03 | Discharge: 2020-01-03 | Payer: MEDICARE | Attending: Internal Medicine | Primary: Internal Medicine

## 2020-01-03 DIAGNOSIS — I10 Essential (primary) hypertension: Secondary | ICD-10-CM

## 2020-01-03 NOTE — Progress Notes (Signed)
HPI: Audrey Snow (DOB: 04/07/1941)  Having skin issues    Having joint pain - right hip    Still with intermittent palpitations    COPD not severe symptoms per pt- not having to use her inhaler but does have more fatigue    Having leg cramps    Problem List:  Patient Active Problem List   Diagnosis Code   ??? Rheumatoid arthritis ([place]) M06.9   ??? Hypothyroidism E03.9   ??? Vitamin D deficiency E55.9   ??? Osteoporosis M81.0   ??? Gastroesophageal reflux disease without esophagitis K21.9   ??? HTN (hypertension), benign I10   ??? Environmental allergies Z91.09   ??? 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 Prediabetes R73.03   ??? Chronic pain of left knee M25.562, G89.29   ??? 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 Hypercholesteremia E78.00   ??? Chronic obstructive pulmonary disease (HCC) J44.9   ??? Dry skin dermatitis L85.3   ??? Bilateral leg cramps R25.2   ??? Elevated blood sugar R73.9       History:  Past Medical History:   Diagnosis Date   ??? Autoimmune disease ([place])     RA   ??? Chronic obstructive pulmonary disease ([place])     very mild per pt.   ??? Chronic pain of left knee 06/25/2018   ??? GERD (gastroesophageal reflux disease)     controlled w/med   ??? H/O cardiovascular stress test     Bruce protocol, low risk study, EF of 67% and normal LV function   ??? H/O mammogram 07/28/2016    mammogram and Ultrasound of right breaset- benign finding- [facility]    ??? Hearing loss of both ears    ??? HTN (hypertension), benign 01/30/2014    controlled w/med   ??? Hypothyroid     on med for control   ??? Menopause    ??? Osteoarthritis    ??? Osteoporosis    ??? 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 Debbra Riding. Methotrexate Rx   ??? Sinusitis    ??? Unspecified hypothyroidism 01/30/2014   ??? Vitamin D deficiency        Allergies:  No Known Allergies    Current Medications:  Current Outpatient Medications   Medication Sig Dispense Refill   ??? gabapentin (NEURONTIN) 100 mg capsule Take 100 mg by mouth three (3) times daily.     ???  albuterol (PROVENTIL HFA, VENTOLIN HFA, PROAIR HFA) 90 mcg/actuation inhaler Take 1 Puff by inhalation every six (6) hours as needed for Wheezing. 1 Inhaler 5   ??? losartan (COZAAR) 100 mg tablet TAKE 1 TABLET BY MOUTH DAILY 90 Tab 3   ??? fluticasone propionate (FLONASE) 50 mcg/actuation nasal spray 2 Sprays by Both Nostrils route daily. 1 Bottle 5   ??? levothyroxine (SYNTHROID) 100 mcg tablet Take 1 Tab by mouth Daily (before breakfast). 30 Tab 11   ??? mupirocin (BACTROBAN) 2 % ointment Apply  to affected area daily. 15 g 1   ??? bacitracin-neomycin-polymyxin b-hydrocortisone (CORTISPORIN) 1 % ointment Use samll amount 3-4 times a day. 15 g 5   ??? denosumab (PROLIA) 60 mg/mL injection 60 mg by SubCUTAneous route.     ??? Cholecalciferol, Vitamin D3, 50,000 unit cap Take 1 Cap by mouth every Monday.     ??? methotrexate, PF, 25 mg/mL injection 25 mg every seven (7) days.     ??? calcium-cholecalciferol, d3, (CALCIUM 600 + D) 600-125 mg-unit tab Take 2 Tabs  by mouth.     ??? multivitamin (ONE A DAY) tablet Take 1 Tab by mouth daily.     ??? folic acid (FOLVITE) 1 mg tablet Take  by mouth daily.     ??? nicotinic acid (NIACIN) 500 mg tablet Take 500 mg by mouth Daily (before breakfast). 2 po everyday. (Patient not taking: Reported on 01/03/2020)         Review of Systems:  Review of Systems   HENT: Positive for hearing loss.    Respiratory: Positive for shortness of breath.    Cardiovascular: Positive for palpitations.   Musculoskeletal: Positive for joint pain.        Leg cramps   All other systems reviewed and are negative.      Vitals:  Visit Vitals  BP 124/70   Temp 97 ??F (36.1 ??C)   Ht 5\' 6"  (1.676 m)   Wt 158 lb (71.7 kg)   BMI 25.50 kg/m??       Physical Exam:  Physical Exam  Vitals reviewed.   Constitutional:       Appearance: Normal appearance. She is well-developed.   HENT:      Head: Normocephalic and atraumatic.      Right Ear: Hearing normal.      Mouth/Throat:      Pharynx: Uvula midline.   Neck:      Thyroid: No thyroid  mass.      Vascular: No carotid bruit.   Cardiovascular:      Rate and Rhythm: Normal rate and regular rhythm.      Pulses: Normal pulses.      Heart sounds: Normal heart sounds, S1 normal and S2 normal.   Pulmonary:      Effort: Pulmonary effort is normal.      Breath sounds: Normal breath sounds. No wheezing.   Chest:      Breasts: Breasts are symmetrical.         Right: No mass, skin change or tenderness.         Left: No mass, skin change or tenderness.   Abdominal:      Palpations: There is no hepatomegaly or splenomegaly.      Hernia: There is no hernia in the left inguinal area.   Genitourinary:     Labia:         Right: No rash.         Left: No rash.       Vagina: No vaginal discharge.      Adnexa:         Right: No mass.          Left: No mass.     Musculoskeletal:         General: Tenderness present. Normal range of motion.      Cervical back: Normal range of motion and neck supple.      Comments: Right SI joint   Skin:     General: Skin is warm and dry.      Findings: Lesion present.      Nails: There is no clubbing.      Comments: Dry skin of back and extremities  2 lesions on right wrist raised and irritated - pt wears watch and rubbing - lesions c/w with seb ker   Neurological:      Mental Status: She is alert and oriented to person, place, and time.      Cranial Nerves: No cranial nerve deficit.      Sensory: No sensory  deficit.      Motor: No tremor or atrophy.      Coordination: Coordination normal.      Gait: Gait normal.      Deep Tendon Reflexes: Reflexes are normal and symmetric.   Psychiatric:         Mood and Affect: Mood normal.         Speech: Speech normal.         Behavior: Behavior normal.         Thought Content: Thought content normal.         Judgment: Judgment normal.         Assessment/Plan:   Diagnoses and all orders for this visit:    1. HTN (hypertension), benign  -     CBC W/O DIFF  -     METABOLIC PANEL, COMPREHENSIVE  stable  2. Acquired hypothyroidism  -     TSH 3RD  GENERATION    3. Rheumatoid arthritis, involving unspecified site, unspecified whether rheumatoid factor present (HCC)  -     CBC W/O DIFF  -     METABOLIC PANEL, COMPREHENSIVE  Sees Dr. Debbra Riding  4. Prediabetes  -     HEMOGLOBIN A1C WITH EAG  Update labs and watch diet  5. Hypercholesteremia  -     LIPID PANEL    6. Elevated blood sugar  -     HEMOGLOBIN A1C WITH EAG    7. Chronic obstructive pulmonary disease, unspecified COPD type ([place])  Monitor- has been evaluated by Pulmonary  8. Bilateral leg cramps  Increase water and can add Mg at night  9. Seborrheic keratoses, inflamed  -     DESTRUC BENIGN LESION, UP TO 14 LESIONS   2 lesions txed with LN therapy at wrist and pt tolerated well  Keep clean  10. Dry skin dermatitis  Hand out given on tx and recommend Eucerin or Lubriderm  Need to keep skin moisturized  11. Cardiac work up negative and felt palpitations and SOB related to pulmonary disease    Current medications are therapeutic at this time; continue as prescribed.       12.  SI joint pain on right- handout given on conservative tx and exercises    [name], MD

## 2020-01-03 NOTE — Progress Notes (Signed)
Pt notified of lab results. A copy of labs mailed to pt. She will continue all medications.

## 2020-01-03 NOTE — Progress Notes (Signed)
Unable to reach pt Left message for pt to call back.

## 2020-01-03 NOTE — Progress Notes (Signed)
Labs are stable for pt   Continue all medications  Mail copy to pt

## 2020-01-04 LAB — CBC
Hematocrit: 41.4 % (ref 34.0–46.6)
Hemoglobin: 13.9 g/dL (ref 11.1–15.9)
MCH: 32.6 pg (ref 26.6–33.0)
MCHC: 33.6 g/dL (ref 31.5–35.7)
MCV: 97 fL (ref 79–97)
Platelets: 257 10*3/uL (ref 150–450)
RBC: 4.27 x10E6/uL (ref 3.77–5.28)
RDW: 14.5 % (ref 11.7–15.4)
WBC: 6.7 10*3/uL (ref 3.4–10.8)

## 2020-01-04 LAB — COMPREHENSIVE METABOLIC PANEL
ALT: 18 IU/L (ref 0–32)
AST: 18 IU/L (ref 0–40)
Albumin/Globulin Ratio: 1.7 NA (ref 1.2–2.2)
Albumin: 4.4 g/dL (ref 3.7–4.7)
Alkaline Phosphatase: 49 IU/L (ref 48–121)
BUN: 14 mg/dL (ref 8–27)
Bun/Cre Ratio: 20 NA (ref 12–28)
CO2: 22 mmol/L (ref 20–29)
Calcium: 9.3 mg/dL (ref 8.7–10.3)
Chloride: 100 mmol/L (ref 96–106)
Creatinine: 0.7 mg/dL (ref 0.57–1.00)
EGFR IF NonAfrican American: 82 mL/min/{1.73_m2} (ref >59)
GFR African American: 95 mL/min/{1.73_m2} (ref >59)
Globulin, Total: 2.6 g/dL (ref 1.5–4.5)
Glucose: 93 mg/dL (ref 65–99)
Potassium: 4.3 mmol/L (ref 3.5–5.2)
Sodium: 139 mmol/L (ref 134–144)
Total Bilirubin: 0.6 mg/dL (ref 0.0–1.2)
Total Protein: 7 g/dL (ref 6.0–8.5)

## 2020-01-04 LAB — LIPID PANEL
Cholesterol, Total: 247 mg/dL — ABNORMAL HIGH (ref 100–199)
Cholesterol, total: 247 mg/dL — ABNORMAL HIGH (ref 100–199)
HDL Cholesterol: 66 mg/dL (ref >39)
HDL: 66 mg/dL (ref >39)
LDL Calculated: 166 mg/dL — ABNORMAL HIGH (ref 0–99)
LDL, calculated: 166 mg/dL — ABNORMAL HIGH (ref 0–99)
Triglyceride: 90 mg/dL (ref 0–149)
Triglycerides: 90 mg/dL (ref 0–149)
VLDL, calculated: 15 mg/dL (ref 5–40)
VLDL: 15 mg/dL (ref 5–40)

## 2020-01-04 LAB — TSH 3RD GENERATION
TSH: 1.7 u[IU]/mL (ref 0.450–4.500)
TSH: 1.7 u[IU]/mL (ref 0.450–4.500)

## 2020-01-04 LAB — HEMOGLOBIN A1C W/EAG
Hemoglobin A1C: 6.1 % — ABNORMAL HIGH (ref 4.8–5.6)
eAG: 128 mg/dL

## 2020-01-04 LAB — HEMOGLOBIN A1C WITH EAG
Estimated average glucose: 128 mg/dL
Hemoglobin A1c: 6.1 % — ABNORMAL HIGH (ref 4.8–5.6)

## 2020-01-04 LAB — CBC W/O DIFF
HCT: 41.4 % (ref 34.0–46.6)
HGB: 13.9 g/dL (ref 11.1–15.9)
MCH: 32.6 pg (ref 26.6–33.0)
MCHC: 33.6 g/dL (ref 31.5–35.7)
MCV: 97 fL (ref 79–97)
PLATELET: 257 10*3/uL (ref 150–450)
RBC: 4.27 x10E6/uL (ref 3.77–5.28)
RDW: 14.5 % (ref 11.7–15.4)
WBC: 6.7 10*3/uL (ref 3.4–10.8)

## 2020-01-04 LAB — METABOLIC PANEL, COMPREHENSIVE
A-G Ratio: 1.7 (ref 1.2–2.2)
ALT (SGPT): 18 IU/L (ref 0–32)
AST (SGOT): 18 IU/L (ref 0–40)
Albumin: 4.4 g/dL (ref 3.7–4.7)
Alk. phosphatase: 49 IU/L (ref 48–121)
BUN/Creatinine ratio: 20 (ref 12–28)
BUN: 14 mg/dL (ref 8–27)
Bilirubin, total: 0.6 mg/dL (ref 0.0–1.2)
CO2: 22 mmol/L (ref 20–29)
Calcium: 9.3 mg/dL (ref 8.7–10.3)
Chloride: 100 mmol/L (ref 96–106)
Creatinine: 0.7 mg/dL (ref 0.57–1.00)
GFR est AA: 95 mL/min/{1.73_m2} (ref >59)
GFR est non-AA: 82 mL/min/{1.73_m2} (ref >59)
GLOBULIN, TOTAL: 2.6 g/dL (ref 1.5–4.5)
Glucose: 93 mg/dL (ref 65–99)
Potassium: 4.3 mmol/L (ref 3.5–5.2)
Protein, total: 7 g/dL (ref 6.0–8.5)
Sodium: 139 mmol/L (ref 134–144)

## 2020-01-25 MED ORDER — LEVOTHYROXINE 100 MCG TAB
100 mcg | ORAL_TABLET | Freq: Every day | ORAL | 11 refills | Status: AC
Start: 2020-01-25 — End: ?

## 2020-02-02 MED ORDER — MUPIROCIN 2 % OINTMENT
2 % | Freq: Every day | CUTANEOUS | 1 refills | Status: AC
Start: 2020-02-02 — End: ?

## 2020-02-09 ENCOUNTER — Encounter

## 2020-02-15 ENCOUNTER — Inpatient Hospital Stay: Admit: 2020-02-15 | Payer: MEDICARE | Attending: Internal Medicine | Primary: Internal Medicine

## 2020-02-15 DIAGNOSIS — Z1231 Encounter for screening mammogram for malignant neoplasm of breast: Secondary | ICD-10-CM

## 2020-02-15 NOTE — Progress Notes (Signed)
Mammogram negative  Copy to pt

## 2020-02-29 ENCOUNTER — Ambulatory Visit: Attending: Family | Primary: Internal Medicine

## 2020-02-29 ENCOUNTER — Ambulatory Visit: Admit: 2020-02-29 | Discharge: 2020-02-29 | Payer: MEDICARE | Attending: Family | Primary: Internal Medicine

## 2020-02-29 ENCOUNTER — Encounter: Attending: Family | Primary: Internal Medicine

## 2020-02-29 ENCOUNTER — Inpatient Hospital Stay: Admit: 2020-02-29 | Payer: MEDICARE | Attending: Family | Primary: Internal Medicine

## 2020-02-29 DIAGNOSIS — R911 Solitary pulmonary nodule: Secondary | ICD-10-CM

## 2020-02-29 NOTE — Progress Notes (Signed)
5cm RLL lung mass; needs biopsy.  Referral to general surgery.  Pt notified of results.

## 2020-02-29 NOTE — Progress Notes (Signed)
Progress Notes by Selinda Orion, NP at 02/29/20 1200                Author: Selinda Orion, NP  Service: --  Author Type: Nurse Practitioner       Filed: 02/29/20 5409  Encounter Date: 02/29/2020  Status: Signed          Editor: Selinda Orion, NP (Nurse Practitioner)                          Audrey Snow (DOB: November 03, 1940)  presents today for ER follow up from 02/27/20.  She had a syncopal episode two days ago after not feeling well when she woke up that morning.  She felt very dizzy and weak afterwards; states she only was out for seconds. She did also have vomiting episode.   She was seen in the ER at [facility]. Cardiac workup and labs were normal yet CXR reviewed right lung base opacity at 5.1cm.  She was unaware of this until I reviewed the film with her today.  She continues to have some cough and chest discomfort, she states.        Chief Complaint       Patient presents with        ?  Dizziness        ?  Cough        Reviewed and updated this visit by provider:   Tobacco   Allergies   Meds   Problems   Med Hx   Surg Hx   Fam Hx          Immunizations:   Immunization status: up to date and documented.      Review of Systems - Negative except as stated in HPI   History obtained from chart review and the patient   General ROS: negative for - fever or night sweats   ENT ROS: negative for - vertigo   Respiratory ROS: positive for - cough and pleuritic pain   negative for - shortness of breath or sputum changes   Cardiovascular ROS: negative for - chest pain or edema   Neurological ROS: positive for - dizziness   negative for - behavioral changes, bowel and bladder control changes, confusion, headaches or memory loss      Visit Vitals      BP  120/70     Temp  97 ??F (36.1 ??C) (Temporal)     Wt  157 lb (71.2 kg)        BMI  25.34 kg/m??        Physical Examination: General appearance - alert, well appearing, and in no distress, oriented to person, place, and time and normal appearing  weight   Mental  status - alert, oriented to person, place, and time, normal mood, behavior, speech, dress, motor activity, and thought processes, affect appropriate to mood   Chest - no tachypnea, retractions or cyanosis, decreased air entry noted right lung base; otherwise clear and no wheezing   Heart - normal rate, regular rhythm, normal S1, S2, no murmurs, rubs, clicks or gallops   Extremities - peripheral pulses normal, no pedal edema, no clubbing or cyanosis      Assessment/Plan:      1.  Lung nodule      2.  Syncope and collapse         3.  Former smoker  Orders Placed This Encounter        ?  CT CHEST WO CONT              Standing Status:    Future         Standing Expiration Date:    03/31/2021         Order Specific Question:    Reason for Exam         Answer:    Pulmonary nodule evaluation; High-risk appearing nodule; No known/automatically detected potential contraindications to iodinated contrast        CT chest - will contact with results and determine plan of care.   For now, continue current medications.      Call or return to clinic prn if these symptoms worsen or fail to improve as anticipated.       Selinda Orion, NP

## 2020-03-01 ENCOUNTER — Encounter

## 2020-03-20 ENCOUNTER — Ambulatory Visit: Attending: Surgery | Primary: Internal Medicine

## 2020-03-20 ENCOUNTER — Ambulatory Visit: Admit: 2020-03-20 | Discharge: 2020-03-20 | Payer: MEDICARE | Attending: Surgery | Primary: Internal Medicine

## 2020-03-20 DIAGNOSIS — R918 Other nonspecific abnormal finding of lung field: Secondary | ICD-10-CM

## 2020-03-20 NOTE — Progress Notes (Signed)
[facility]  [address], [address]  [place] [address]  [phone]     03/20/2020  Patient:  Audrey Snow  DOB: 1940/07/19    HPI  Audrey Snow is a 79 y.o. female who was referred by Clista Bernhardt NP for evaluation of a lung mass involving the Right lower lobe. This is suspicious appearing 5cm mass.  She has not had any further workup with exception to the CT scan.  This appears to be incidental finding during the workup for a near syncope episode a few weeks ago. She has some front chest pain, and shortness of breath. She has been worked up by cardiology for arrhythmia.      She  does have a history of smoking remotely, quit 40 years ago. She does not have a history of drinking. She is very hard hearing. Other medical issues are reviewed as below. She had right thoracoscopy to remove a mass in chest years ago, she believes it was benign. She does not have a family history of cancer.   does not take blood thinner.              Past Medical History:   Diagnosis Date   ??? Autoimmune disease ([place])     RA   ??? Chronic obstructive pulmonary disease ([place])     very mild per pt.   ??? Chronic pain of left knee 06/25/2018   ??? GERD (gastroesophageal reflux disease)     controlled w/med   ??? H/O cardiovascular stress test     Bruce protocol, low risk study, EF of 67% and normal LV function   ??? H/O mammogram 07/28/2016    mammogram and Ultrasound of right breaset- benign finding- [facility]    ??? Hearing loss of both ears    ??? HTN (hypertension), benign 01/30/2014    controlled w/med   ??? Hypothyroid     on med for control   ??? Menopause    ??? Osteoarthritis    ??? Osteoporosis    ??? 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 Debbra Riding. Methotrexate Rx   ??? Sinusitis    ??? Unspecified hypothyroidism 01/30/2014   ??? Vitamin D deficiency      Current Outpatient Medications   Medication Sig Dispense Refill   ??? Advair HFA 115-21 mcg/actuation inhaler      ??? metoprolol succinate (TOPROL-XL) 50 mg XL tablet Take 50  mg by mouth daily.     ??? atorvastatin (LIPITOR) 20 mg tablet Take 20 mg by mouth daily.     ??? ergocalciferol (ERGOCALCIFEROL) 1,250 mcg (50,000 unit) capsule Take 50,000 Units by mouth every seven (7) days.     ??? mupirocin (BACTROBAN) 2 % ointment Apply  to affected area daily. 15 g 1   ??? levothyroxine (SYNTHROID) 100 mcg tablet Take 1 Tablet by mouth Daily (before breakfast). 30 Tablet 11   ??? gabapentin (NEURONTIN) 100 mg capsule Take 100 mg by mouth three (3) times daily.     ??? albuterol (PROVENTIL HFA, VENTOLIN HFA, PROAIR HFA) 90 mcg/actuation inhaler Take 1 Puff by inhalation every six (6) hours as needed for Wheezing. 1 Inhaler 5   ??? losartan (COZAAR) 100 mg tablet TAKE 1 TABLET BY MOUTH DAILY 90 Tab 3   ??? fluticasone propionate (FLONASE) 50 mcg/actuation nasal spray 2 Sprays by Both Nostrils route daily. 1 Bottle 5   ??? bacitracin-neomycin-polymyxin b-hydrocortisone (CORTISPORIN) 1 % ointment Use samll amount 3-4 times a day. 15  g 5   ??? denosumab (PROLIA) 60 mg/mL injection 60 mg by SubCUTAneous route.     ??? Cholecalciferol, Vitamin D3, 50,000 unit cap Take 1 Cap by mouth every Monday.     ??? methotrexate, PF, 25 mg/mL injection 25 mg every seven (7) days.     ??? nicotinic acid (NIACIN) 500 mg tablet Take 500 mg by mouth Daily (before breakfast). 2 po everyday.      ??? calcium-cholecalciferol, d3, (CALCIUM 600 + D) 600-125 mg-unit tab Take 2 Tabs by mouth.     ??? multivitamin (ONE A DAY) tablet Take 1 Tab by mouth daily.     ??? folic acid (FOLVITE) 1 mg tablet Take  by mouth daily.       Allergies   Allergen Reactions   ??? Yeast Nausea and Vomiting     Past Surgical History:   Procedure Laterality Date   ??? COLONOSCOPY N/A 01/27/2018    COLONOSCOPY/BMI 26 performed by Azalia Bilis, MD at [facility]   ??? HX CARPAL TUNNEL RELEASE Bilateral    ??? HX COLONOSCOPY      colon polyps-adenomatous, has seen Dr. Kristopher Glee in past   ??? 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 HX HEMORRHOIDECTOMY     ??? HX  OTHER SURGICAL  09/2012    sinus surgery-Dr. Mickle Mallory   ??? 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 HX TONSILLECTOMY  79 yrs old   ??? PR COLONOSCOPY FLX DX W/COLLJ SPEC WHEN PFRMD  01/27/2018          Family History   Problem Relation Age of Onset   ??? Arthritis-osteo Mother    ??? Alzheimer Father    ??? Breast Cancer Neg Hx      Social History     Tobacco Use   ??? Smoking status: Former Smoker     Packs/day: 0.50     Years: 43.00     Pack years: 21.50   ??? Smokeless tobacco: Never Used   ??? Tobacco comment: not regular when first started- 1pk/week   Substance Use Topics   ??? Alcohol use: No     Alcohol/week: 0.0 standard drinks         Review of Systems   A comprehensive review of systems was negative except for that written in the HPI.     Physical Exam  Visit Vitals  Pulse (!) 56   Temp 98.1 ??F (36.7 ??C)   Ht 5\' 6"  (1.676 m)   Wt 157 lb (71.2 kg)   SpO2 90%   BMI 25.34 kg/m??        General: Alert, oriented, cooperative, awake patient in no acute distress   Skin:  Warm, moist with good texture   Eyes:   Sclera are clear, extraocular muscles intact  HENT:  Normocephalic; oral mucosa moist, nares patent; neck is supple; trachea midline  Respiratory: Lungs clear to auscultation bilaterally, breathing is non-labored   Chest:  Symmetric throughout one respiratory excursion; no supraclavicular lymphadenopathy  CV:  Regular rate and rhythm, no appreciable murmurs, rubs, gallops  Abdomen: Soft, protuberant but non-distended; bowel sounds are normoactive   Extremities: No cyanosis, clubbing or edema  Neurological: No focal signs      Labs:   Lab Results   Component Value Date/Time    WBC 6.7 01/03/2020 02:25 PM    HGB 13.9 01/03/2020 02:25 PM    HCT 41.4  01/03/2020 02:25 PM    PLATELET 257 01/03/2020 02:25 PM    MCV 97 01/03/2020 02:25 PM     Lab Results   Component Value Date/Time    Sodium 139 01/03/2020 02:25 PM    Potassium 4.3 01/03/2020 02:25 PM    Chloride 100 01/03/2020 02:25 PM    CO2 22 01/03/2020 02:25 PM     Anion gap 7 08/23/2012 12:37 PM    Glucose 93 01/03/2020 02:25 PM    BUN 14 01/03/2020 02:25 PM    Creatinine 0.70 01/03/2020 02:25 PM    BUN/Creatinine ratio 20 01/03/2020 02:25 PM    GFR est AA 95 01/03/2020 02:25 PM    GFR est non-AA 82 01/03/2020 02:25 PM    Calcium 9.3 01/03/2020 02:25 PM        CT Results (most recent):  Results from Hospital Encounter encounter on 02/29/20    CT CHEST WO CONT    Narrative  NONCONTRAST CHEST CT    CLINICAL HISTORY:  Follow-up abnormal chest x-ray with right lung mass an  79 year old former smoker with a 20 pack-year history.    TECHNIQUE:  Without contrast administration, the chest was scanned with spiral  technique.  Radiation dose reduction was achieved using one or all of the  following techniques: automated exposure control, weight-based dosing, iterative  reconstruction.    COMPARISON:  Radiographs dated February 27, 2020 from [facility] without images for  direct comparison.    FINDINGS:  Again noted is a 5 cm mass with subjacent pleural lobularity  posteriorly in the right lower lobe which is not typical of rounded pneumonia  and is suspicious for bronchogenic carcinoma in this clinical setting.  No  pathologically enlarged lymph nodes or abnormal fluid collection is evident.  No  other definite pulmonary nodule is statistically most from scattered irregular  linear densities superimposed upon centrilobular emphysema.  The epigastrium  appears unremarkable as imaged.  Bone windows demonstrate no definite aggressive  process, accounting for degenerative changes.    Impression  A 5 CM MASS WITH ADJACENT PLEURAL NODULARITY POSTERIORLY AT THE RIGHT LUNG BASE  MUST BE CONSIDERED SUSPICIOUS FOR BRONCHOGENIC CARCINOMA TILL PROVEN OTHERWISE  IN THIS 86-YEAR-OLD WITH EMPHYSEMA AND SIGNIFICANT SMOKING HISTORY.  THE AS  WOULD BE AMENABLE TO PERCUTANEOUS CORE NEEDLE BIOPSY WITH CT GUIDANCE.  IF  FURTHER IMAGING EVALUATION IS CLINICALLY INDICATED AT THIS TIME, THEN PET/CT  WOULD BE MOST  USEFUL.      689  The significant findings in this report have been referred to the Imaging  Navigator in order to communicate to the referring provider or his/her designee  as outlined in Section II.C.2.a.ii-iii of the ACR Practice Guideline for  Communication of Diagnostic Imaging Findings.      Pathology  No pathology yet.    Assessment/Plan:   Audrey Snow is a 79 y.o. female who has signs and symptoms consistent with right lower lobe lung mass, highly suspicious of malignancy. Will refer to pulmonary for EBUS biopsy and pulmonary function test, get PET scan, then reconvene with above results for further recommendation. If she becomes surgical candidate, cardiac evaluation will be needed.     Total time spent with patient including chart review is 45 minutes    Thayer Jew, MD

## 2020-03-21 ENCOUNTER — Telehealth

## 2020-03-21 NOTE — Telephone Encounter (Signed)
CC:  Needs EBUS and PFT.  5cm Lung mass RLL.  CT Chest 02/29/20 @ [facility].  PET scheduled for 04/02/20.      Urgent new patient referral from Dr. Sheria Lang @ [facility].  [facility], [facility], 12/07/19.  Pt has previous imaging @ [facility].  Images will need to be requested to PACS.

## 2020-03-23 NOTE — Telephone Encounter (Signed)
Agree with PET scan. Would set up office appointment.   Yi's note mentions past thoracotomy for lung mass that was benign. Would try to get these results.   Do not immediately see any EBUS targets, doubt that will be helpful.   Images are not in as super D slices, so cannot tell for sure but do not see an obvious airway to this lesion.   If PET scan is negative other than the target CT guided biopsy would probably be best.     Leverne Humbles, MD

## 2020-03-23 NOTE — Telephone Encounter (Signed)
 Telephone Encounter by Kandy Adrien CROME, RT at 03/23/20 9055                Author: Kandy Adrien CROME, RT  Service: --  Author Type: Respiratory Therapist       Filed: 03/23/20 1001  Encounter Date: 03/21/2020  Status: Addendum          Editor: Kisler, Adrien CROME, RT (Respiratory Therapist)          Related Notes: Original Note by Kandy Adrien CROME, RT (Respiratory Therapist) filed at 03/23/20  [phone]               Patient is a new referral from Dr Tamsen for CPFT and EBUS.  5 cm mass in RLL.  D Tamsen has ordered a PET scan that is scheduled for 04/02/2020.     CT chest 02/29/2020:        IMPRESSION      A 5 CM MASS WITH ADJACENT PLEURAL NODULARITY POSTERIORLY AT THE RIGHT LUNG BASE   MUST BE CONSIDERED SUSPICIOUS FOR BRONCHOGENIC CARCINOMA TILL PROVEN OTHERWISE   IN THIS 79-YEAR-OLD WITH EMPHYSEMA AND SIGNIFICANT SMOKING HISTORY.  THE AS   WOULD BE AMENABLE TO PERCUTANEOUS CORE NEEDLE BIOPSY WITH CT GUIDANCE.  IF   FURTHER IMAGING EVALUATION IS CLINICALLY INDICATED AT THIS TIME, THEN PET/CT   WOULD BE MOST USEFUL.      I have called [facility] to push CT chest to [facility] if still available.       Dr Jestine will you please look at this image and let me know if you feel EBUS appropriate.  Will work to get her scheduled in office after PET scan.

## 2020-03-23 NOTE — Telephone Encounter (Signed)
Left a message for a return call.  When she calls back, please ask where Right thoracoscopy was done prior and try to obtain those results.  Patient has PET scan on 9/27 so office appointment after that with CPFT prior if able.

## 2020-03-26 NOTE — Telephone Encounter (Signed)
I have returned call to patient.  She is not sure where or when lung surgery was done.  She thinks it was at [facility] and thinks Dr Nancie Neas did this procedure.  Following copied from Dr Victorino December 05/2014 office note.  History of present illness:   79 year-old CT June 2014 2.2 x 3.2 anterior mediastinal mass and 19 x 13 mm right hilar mass. 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  Her PET scan is already scheduled on 9/27.  We have reviewed all appointments and she voices understanding.

## 2020-03-26 NOTE — Telephone Encounter (Signed)
Patient is calling Debi back

## 2020-03-30 ENCOUNTER — Encounter

## 2020-03-30 ENCOUNTER — Institutional Professional Consult (permissible substitution): Payer: MEDICARE | Primary: Internal Medicine

## 2020-03-30 DIAGNOSIS — Z20822 Contact with and (suspected) exposure to covid-19: Secondary | ICD-10-CM

## 2020-03-30 NOTE — Progress Notes (Signed)
Patient presented to the [facility] for COVID testing as ordered.  After verbal consent given by patient/caregiver,  nasal swab obtained and sent to lab for processing.

## 2020-04-01 LAB — COVID-19: SARS-CoV-2, NAA: NOT DETECTED

## 2020-04-01 LAB — SARS-COV-2, NAA 2 DAY TAT

## 2020-04-01 LAB — NOVEL CORONAVIRUS (COVID-19): SARS-CoV-2, NAA: NOT DETECTED

## 2020-04-02 ENCOUNTER — Inpatient Hospital Stay: Admit: 2020-04-02 | Payer: MEDICARE | Primary: Internal Medicine

## 2020-04-02 DIAGNOSIS — R918 Other nonspecific abnormal finding of lung field: Secondary | ICD-10-CM

## 2020-04-02 LAB — POCT GLUCOSE: POC Glucose: 115 mg/dL — ABNORMAL HIGH (ref 65–100)

## 2020-04-02 LAB — GLUCOSE, POC: Glucose (POC): 115 mg/dL — ABNORMAL HIGH (ref 65–100)

## 2020-04-02 MED ORDER — FLUDEOXYGLUCOSE F-18 20 MCI TO 300 MCI/ML INTRAVENOUS SOLUTION
20 mCi to 300 mCi/mL | Freq: Once | INTRAVENOUS | Status: AC
Start: 2020-04-02 — End: 2020-04-02
  Administered 2020-04-02: 14:00:00 via INTRAVENOUS

## 2020-04-02 MED ORDER — SALINE PERIPHERAL FLUSH PRN
Freq: Once | INTRAMUSCULAR | Status: AC
Start: 2020-04-02 — End: 2020-04-02
  Administered 2020-04-02: 14:00:00

## 2020-04-02 MED ORDER — DIATRIZOATE MEGLUMINE & SODIUM 66 %-10 % ORAL SOLN
66-10 % | Freq: Once | ORAL | Status: AC
Start: 2020-04-02 — End: 2020-04-02
  Administered 2020-04-02: 14:00:00 via ORAL

## 2020-04-03 ENCOUNTER — Inpatient Hospital Stay: Admit: 2020-04-03 | Payer: MEDICARE | Attending: Internal Medicine | Primary: Internal Medicine

## 2020-04-03 DIAGNOSIS — R918 Other nonspecific abnormal finding of lung field: Secondary | ICD-10-CM

## 2020-04-05 ENCOUNTER — Ambulatory Visit: Attending: Nurse Practitioner | Primary: Internal Medicine

## 2020-04-05 ENCOUNTER — Ambulatory Visit: Admit: 2020-04-05 | Discharge: 2020-04-05 | Payer: MEDICARE | Attending: Nurse Practitioner | Primary: Internal Medicine

## 2020-04-05 ENCOUNTER — Inpatient Hospital Stay: Admit: 2020-04-05 | Payer: MEDICARE | Primary: Internal Medicine

## 2020-04-05 DIAGNOSIS — R918 Other nonspecific abnormal finding of lung field: Secondary | ICD-10-CM

## 2020-04-05 LAB — CBC WITH AUTO DIFFERENTIAL
Basophils %: 1 % (ref 0.0–2.0)
Basophils Absolute: 0.1 10*3/uL (ref 0.0–0.2)
Eosinophils %: 6 % (ref 0.5–7.8)
Eosinophils Absolute: 0.6 10*3/uL (ref 0.0–0.8)
Granulocyte Absolute Count: 0 10*3/uL (ref 0.0–0.5)
Hematocrit: 41.7 % (ref 35.8–46.3)
Hemoglobin: 13.3 g/dL (ref 11.7–15.4)
Immature Granulocytes: 0 % (ref 0.0–5.0)
Lymphocytes %: 17 % (ref 13–44)
Lymphocytes Absolute: 1.7 10*3/uL (ref 0.5–4.6)
MCH: 31.7 PG (ref 26.1–32.9)
MCHC: 31.9 g/dL (ref 31.4–35.0)
MCV: 99.3 FL — ABNORMAL HIGH (ref 79.6–97.8)
MPV: 10 FL (ref 9.4–12.3)
Monocytes %: 10 % (ref 4.0–12.0)
Monocytes Absolute: 1 10*3/uL (ref 0.1–1.3)
NRBC Absolute: 0 10*3/uL (ref 0.0–0.2)
Neutrophils %: 66 % (ref 43–78)
Neutrophils Absolute: 6.5 10*3/uL (ref 1.7–8.2)
Platelets: 341 10*3/uL (ref 150–450)
RBC: 4.2 M/uL (ref 4.05–5.2)
RDW: 13.5 % (ref 11.9–14.6)
WBC: 9.8 10*3/uL (ref 4.3–11.1)

## 2020-04-05 LAB — BASIC METABOLIC PANEL
Anion Gap: 9 mmol/L (ref 7–16)
BUN: 9 MG/DL (ref 8–23)
CO2: 28 mmol/L (ref 21–32)
Calcium: 9.8 MG/DL (ref 8.3–10.4)
Chloride: 101 mmol/L (ref 98–107)
Creatinine: 0.79 MG/DL (ref 0.6–1.0)
EGFR IF NonAfrican American: >60 mL/min/{1.73_m2} (ref >60)
GFR African American: >60 mL/min/{1.73_m2} (ref >60)
Glucose: 103 mg/dL — ABNORMAL HIGH (ref 65–100)
Potassium: 3.9 mmol/L (ref 3.5–5.1)
Sodium: 138 mmol/L (ref 136–145)

## 2020-04-05 LAB — PROTIME-INR
INR: 1
Protime: 13.5 s (ref 12.6–14.5)

## 2020-04-05 LAB — APTT: aPTT: 28.9 s (ref 24.1–35.1)

## 2020-04-05 LAB — CBC WITH AUTOMATED DIFF
ABS. BASOPHILS: 0.1 10*3/uL (ref 0.0–0.2)
ABS. EOSINOPHILS: 0.6 10*3/uL (ref 0.0–0.8)
ABS. IMM. GRANS.: 0 10*3/uL (ref 0.0–0.5)
ABS. LYMPHOCYTES: 1.7 10*3/uL (ref 0.5–4.6)
ABS. MONOCYTES: 1 10*3/uL (ref 0.1–1.3)
ABS. NEUTROPHILS: 6.5 10*3/uL (ref 1.7–8.2)
ABSOLUTE NRBC: 0 10*3/uL (ref 0.0–0.2)
BASOPHILS: 1 % (ref 0.0–2.0)
EOSINOPHILS: 6 % (ref 0.5–7.8)
HCT: 41.7 % (ref 35.8–46.3)
HGB: 13.3 g/dL (ref 11.7–15.4)
IMMATURE GRANULOCYTES: 0 % (ref 0.0–5.0)
LYMPHOCYTES: 17 % (ref 13–44)
MCH: 31.7 PG (ref 26.1–32.9)
MCHC: 31.9 g/dL (ref 31.4–35.0)
MCV: 99.3 FL — ABNORMAL HIGH (ref 79.6–97.8)
MONOCYTES: 10 % (ref 4.0–12.0)
MPV: 10 FL (ref 9.4–12.3)
NEUTROPHILS: 66 % (ref 43–78)
PLATELET: 341 10*3/uL (ref 150–450)
RBC: 4.2 M/uL (ref 4.05–5.2)
RDW: 13.5 % (ref 11.9–14.6)
WBC: 9.8 10*3/uL (ref 4.3–11.1)

## 2020-04-05 LAB — PTT: aPTT: 28.9 s (ref 24.1–35.1)

## 2020-04-05 LAB — PROTHROMBIN TIME + INR
INR: 1
Prothrombin time: 13.5 s (ref 12.6–14.5)

## 2020-04-05 LAB — METABOLIC PANEL, BASIC
Anion gap: 9 mmol/L (ref 7–16)
BUN: 9 MG/DL (ref 8–23)
CO2: 28 mmol/L (ref 21–32)
Calcium: 9.8 MG/DL (ref 8.3–10.4)
Chloride: 101 mmol/L (ref 98–107)
Creatinine: 0.79 MG/DL (ref 0.6–1.0)
GFR est AA: >60 mL/min/{1.73_m2} (ref >60)
GFR est non-AA: >60 mL/min/{1.73_m2} (ref >60)
Glucose: 103 mg/dL — ABNORMAL HIGH (ref 65–100)
Potassium: 3.9 mmol/L (ref 3.5–5.1)
Sodium: 138 mmol/L (ref 136–145)

## 2020-04-05 MED ORDER — ANORO ELLIPTA 62.5 MCG-25 MCG/ACTUATION POWDER FOR INHALATION
Freq: Every day | RESPIRATORY_TRACT | 11 refills | Status: AC
Start: 2020-04-05 — End: ?

## 2020-04-05 NOTE — Progress Notes (Signed)
Cytology order for BX on 04/16/2020 placed in [facility] and faxed to IR.  I have notified Bernette Mayers and Celso Sickle.

## 2020-04-05 NOTE — Progress Notes (Signed)
Progress Notes by Georgiana Shore, NP at 05-01-20 1030                Author: Georgiana Shore, NP  Service: --  Author Type: Nurse Practitioner       Filed: May 01, 2020 1220  Encounter Date: 2020-05-01  Status: Signed          Editor: Georgiana Shore, NP (Nurse Practitioner)                                  [address]   [place] [address]   [phone]            Name:  Audrey Snow   Date of Birth:  1940/08/19   MRN:  [id number]      Office visit   05/01/2020           Chief Complaint       Patient presents with        ?  New Patient        ?  Lung Mass              HISTORY OF PRESENT ILLNESS:   The patient is an 79 year old female who is seen at the request of Dr. Sheria Lang for 6 cm RLL mass.  The patient has has a history of vitamin D deficiency, hypothyroidism, RA  (Dr. Alexandria Lodge MTX), osteoporosis, HTN, extreme hearing loss, GERD, and COPD.  She has previously been followed by [facility] pulmonary for COPD.  She has a 21 pack year history of cigarette smoking, but quit in 1985.  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 Trina Ao.  She had a near syncopal episode in July which led to a CT of chest which demonstrated an incidental finding of a 5 cm RLL mass.  She has seen cardiology at [facility] and had Holter  monitor and stress test (poor exercise capacity).  Denies any fever, chills, or night sweats.  Weight has been stable.  No hemoptysis.        Last CT of chest at [facility] was done 11/14/2016 which revealed cluster of nodular opacities in RLL, largest 5 mm.  Recommended follow up in 12 months, but this was not done.      Patient reports progressive DOE.  Takes albuterol prn.  Previously was on Anoro, but stopped it for unclear reasons.         Past medical history, surgical history, family history, and social history are reviewed and updated as necessary.               Problem List   Date Reviewed:  05/01/2020                     Codes  Class  Noted             Right lower lobe lung mass  ICD-10-CM: R91.8   ICD-9-CM: 786.6    03/20/2020                       Dry skin dermatitis  ICD-10-CM: L85.3   ICD-9-CM: 692.89    01/03/2020                       Bilateral leg cramps  ICD-10-CM: R25.2   ICD-9-CM: 729.82    01/03/2020                       Elevated blood sugar  ICD-10-CM: R73.9   ICD-9-CM: 790.29    01/03/2020                       Hyperlipidemia  ICD-10-CM: E78.5   ICD-9-CM: 272.4    01/04/2019                       Chronic obstructive pulmonary disease ([place])  ICD-10-CM: J44.9   ICD-9-CM: 062    01/04/2019                       Pre-ulcerative corn or callous  ICD-10-CM: L84   ICD-9-CM: 700    06/25/2018                       Chronic pain of left knee  ICD-10-CM: M25.562, G89.29   ICD-9-CM: 719.46, 338.29    01/26/2018          Overview Signed 02/29/2020 12:10 PM by Selinda Orion, NP            Last Assessment & Plan:    Formatting of this note might be different from the original.   Discussed in detail today.  We will see if we can get Visco therapy approved.  Referral to PT.                                   Prediabetes  ICD-10-CM: R73.03   ICD-9-CM: 790.29    06/10/2016                       Chronic right-sided low back pain without sciatica  ICD-10-CM: M54.5, G89.29   ICD-9-CM: 724.2, 338.29    02/12/2016                       Environmental allergies  ICD-10-CM: Z91.09   ICD-9-CM: V15.09    10/05/2014                       Rheumatoid arthritis involving multiple sites with positive rheumatoid factor (HCC)  ICD-10-CM: M05.79   ICD-9-CM: 714.0    01/30/2014          Overview Signed 02/29/2020 12:10 PM by Selinda Orion, NP            Last Assessment & Plan:    Formatting of this note might be different from the original.   Chronic/Stable. Denies any recent RA flares. Patient is taking MTX and folic acid. Denies any adverse side effects. Continue with current treatment plan. Reviewed previous labs. Ordered labs today. Will continue to monitor over  time.      Instructed patient to contact our office with any new or worsening symptoms.                                   Hypothyroidism  ICD-10-CM: E03.9   ICD-9-CM: 244.9    01/30/2014                       Vitamin  D deficiency  ICD-10-CM: E55.9   ICD-9-CM: 268.9    01/30/2014          Overview Signed 02/29/2020 12:10 PM by Selinda Orion, NP            Last Assessment & Plan:    Formatting of this note might be different from the original.   Encouraged appropriate vitamin D supplementation. Monitor for signs or symptoms of hypercalcemia.                                   Osteoporosis  ICD-10-CM: M81.0   ICD-9-CM: 733.00    01/30/2014                       Gastroesophageal reflux disease without esophagitis  ICD-10-CM: K21.9   ICD-9-CM: 530.81    01/30/2014                       HTN (hypertension)  ICD-10-CM: I10   ICD-9-CM: 401.9    01/30/2014                               Allergies        Allergen  Reactions         ?  Yeast  Nausea and Vomiting             Current Outpatient Medications        Medication  Sig         ?  umeclidinium-vilanteroL (Anoro Ellipta) 62.5-25 mcg/actuation inhaler  Take 1 Puff by inhalation daily.     ?  metoprolol succinate (TOPROL-XL) 50 mg XL tablet  Take 50 mg by mouth daily.     ?  atorvastatin (LIPITOR) 20 mg tablet  Take 20 mg by mouth daily.     ?  ergocalciferol (ERGOCALCIFEROL) 1,250 mcg (50,000 unit) capsule  Take 50,000 Units by mouth every seven (7) days.     ?  mupirocin (BACTROBAN) 2 % ointment  Apply  to affected area daily.     ?  levothyroxine (SYNTHROID) 100 mcg tablet  Take 1 Tablet by mouth Daily (before breakfast).     ?  gabapentin (NEURONTIN) 100 mg capsule  Take 100 mg by mouth three (3) times daily.     ?  albuterol (PROVENTIL HFA, VENTOLIN HFA, PROAIR HFA) 90 mcg/actuation inhaler  Take 1 Puff by inhalation every six (6) hours as needed for Wheezing.     ?  losartan (COZAAR) 100 mg tablet  TAKE 1 TABLET BY MOUTH DAILY     ?  fluticasone propionate (FLONASE) 50  mcg/actuation nasal spray  2 Sprays by Both Nostrils route daily.     ?  bacitracin-neomycin-polymyxin b-hydrocortisone (CORTISPORIN) 1 % ointment  Use samll amount 3-4 times a day.     ?  Cholecalciferol, Vitamin D3, 50,000 unit cap  Take 1 Cap by mouth every Monday.     ?  methotrexate, PF, 25 mg/mL injection  25 mg every seven (7) days.     ?  nicotinic acid (NIACIN) 500 mg tablet  Take 500 mg by mouth Daily (before breakfast). 2 po everyday.      ?  calcium-cholecalciferol, d3, (CALCIUM 600 + D) 600-125 mg-unit tab  Take 2 Tabs by mouth.     ?  multivitamin (ONE A  DAY) tablet  Take 1 Tab by mouth daily.     ?  folic acid (FOLVITE) 1 mg tablet  Take  by mouth daily.         ?  denosumab (PROLIA) 60 mg/mL injection  60 mg by SubCUTAneous route. (Patient not taking: Reported on 04/05/2020)          No current facility-administered medications for this visit.                 Review of Systems    Constitutional: Negative for chills, diaphoresis, fever, malaise/fatigue and weight loss.    HENT: Positive for hearing loss. Negative for congestion, ear discharge, ear pain, nosebleeds, sinus pain, sore throat and tinnitus.     Eyes: Negative for blurred vision, pain and redness.    Respiratory: Positive for shortness of breath. Negative for cough, hemoptysis, sputum production and wheezing.     Cardiovascular: Negative for chest pain, palpitations, orthopnea, leg swelling and PND.    Gastrointestinal: Negative for abdominal pain, blood in stool, constipation, diarrhea, heartburn, melena, nausea and vomiting.    Genitourinary: Negative for dysuria, frequency, hematuria and urgency.    Musculoskeletal: Negative for back pain, joint pain, myalgias and neck pain.    Skin: Negative for itching and rash.    Neurological: Negative for dizziness, tremors, focal weakness, seizures and headaches.    Endo/Heme/Allergies: Negative for environmental allergies. Does not bruise/bleed easily.    Psychiatric/Behavioral: Negative for  depression, hallucinations, memory loss and suicidal ideas. The patient is not nervous/anxious.                 PHYSICAL EXAM:        Vitals:          04/05/20 1031        BP:  118/84     Pulse:  81     Temp:  97.5 ??F (36.4 ??C)     TempSrc:  Temporal     Resp:  17     Height:  5' 5.5" (1.664 m)     Weight:  155 lb 4.8 oz (70.4 kg)        SpO2:  92%   Comment: RA        Body mass index is 25.45 kg/m??.         GENERAL APPEARANCE:  The patient is normal weight and in no respiratory distress.   HEENT:  PERRL.  Conjunctivae unremarkable.  Nasal mucosa is without epistaxis, exudate, or polyps.  Gums and dentition are unremarkable.  There  is no oropharyngeal narrowing.  TMs are clear.   NECK/LYMPHATIC:  Symmetrical with no elevation of jugular venous pulsation.  Trachea midline. No thyroid enlargement.  No cervical adenopathy.   LUNGS:  Normal respiratory effort with symmetrical lung expansion.   Breath sounds minimally decreased bilaterally, but clear.   HEART:  There is a regular rate and rhythm.  No murmur, rub, or gallop.  There is no edema in the lower extremities.   ABDOMEN:  Soft and non-tender.  No hepatosplenomegaly.  Bowel sounds are normal.     SKIN:  There are no rashes, cyanosis, jaundice, or ecchymosis present.   EXTREMITIES:  The extremities are unremarkable without clubbing, cyanosis, joint inflammation, degenerative, or ischemic change.   MUSCULOSKELETAL:  There is no abnormal tone, muscle atrophy, or abnormal movement present.   NEURO:  The patient is alert and oriented to person, place, and time.  Memory appears intact and mood is normal.  No gross sensorimotor deficits  are present.         DIAGNOSTIC TESTS:    CXR PA and lateral:  Results for orders placed during the hospital encounter of 10/18/14      Narrative   TWO-VIEW CHEST:      CLINICAL HISTORY: Mid epigastric pain, cough, shortness of breath for several   months with a history of tobacco abuse and rheumatoid arthritis.      COMPARISON:   None.      FINDINGS: PA and lateral chest images demonstrate hyperinflation but no   confluent pneumonic infiltrate or significant pleural fluid collection.  There   are minimal linear densities at the left base.  The heart size is within normal   limits without evidence of congestive heart failure or pneumothorax.  The bony   thorax appears intact on these views.      Impression   :  HYPERINFLATION SUGGESTING EMPHYSEMA IN THIS CLINICAL SETTING WITH   MILD LEFT BASILAR ATELECTASIS/SCARRING, THE CHRONICITY OF WHICH IS UNCERTAIN   WITHOUT PRIOR STUDIES FOR COMPARISON.                 CT of chest without contrast:   Results for orders placed during the hospital encounter of 02/29/20      Narrative   NONCONTRAST CHEST CT      CLINICAL HISTORY:  Follow-up abnormal chest x-ray with right lung mass an   79 year old former smoker with a 20 pack-year history.      TECHNIQUE:  Without contrast administration, the chest was scanned with spiral   technique.  Radiation dose reduction was achieved using one or all of the   following techniques: automated exposure control, weight-based dosing, iterative   reconstruction.      COMPARISON:  Radiographs dated February 27, 2020 from [place] without images for   direct comparison.      FINDINGS:  Again noted is a 5 cm mass with subjacent pleural lobularity   posteriorly in the right lower lobe which is not typical of rounded pneumonia   and is suspicious for bronchogenic carcinoma in this clinical setting.  No   pathologically enlarged lymph nodes or abnormal fluid collection is evident.  No   other definite pulmonary nodule is statistically most from scattered irregular   linear densities superimposed upon centrilobular emphysema.  The epigastrium   appears unremarkable as imaged.  Bone windows demonstrate no definite aggressive   process, accounting for degenerative changes.      Impression   A 5 CM MASS WITH ADJACENT PLEURAL NODULARITY POSTERIORLY AT THE RIGHT LUNG BASE   MUST BE  CONSIDERED SUSPICIOUS FOR BRONCHOGENIC CARCINOMA TILL PROVEN OTHERWISE   IN THIS 82-YEAR-OLD WITH EMPHYSEMA AND SIGNIFICANT SMOKING HISTORY.  THE AS   WOULD BE AMENABLE TO PERCUTANEOUS CORE NEEDLE BIOPSY WITH CT GUIDANCE.  IF   FURTHER IMAGING EVALUATION IS CLINICALLY INDICATED AT THIS TIME, THEN PET/CT   WOULD BE MOST USEFUL.                                    PET/CT:  Results for orders placed during the hospital encounter of 04/02/20   Narrative   EXAM: PET/CT TUMOR IMAGE SKULL THIGH W (INI)      INDICATION: highly suspicious for lung cancer.      PROCEDURE: Following IV injection of 13.91 mCi 18 Fluoro 2 deoxyglucose (FDG)   and a  standard uptake delay, PET imaging is performed from head to thighs and   axial, sagittal and coronal images were acquired. Unenhanced CT is obtained for   anatomic localization, and attenuation correction of the PET scan. Patient   preprocedure blood glucose level: 1:15mg /dL.      CORRELATIVE IMAGING STUDIES: CT chest dated 02/29/2020.      COMPARISON PET: No prior PET/CT      HISTORY: The study is requested for initial treatment strategy. Marland Kitchen      FINDINGS:      HEAD/NECK:   Mildly hypermetabolic nodule within the superficial left parotid gland, favored   to represent a lymph node. No other hypermetabolic cervical lymphadenopathy.   Cerebral evaluation is limited by normal intense activity.      CHEST:   Redemonstrated 6 cm pulmonary mass within the right lower lobe which   demonstrates peripheral nodular hypermetabolic activity (max SUV 8.9).   Additional nodular area inferiorly along the right posterior pleura which   demonstrates increased metabolic activity (max SUV 8.9). No hypermetabolic hilar   or mediastinal lymphadenopathy. Scattered calcified atherosclerotic disease      ABDOMEN/PELVIS:   No foci of abnormal hypermetabolism. Suspected physiologic uptake within the   gastrointestinal and genitourinary tracts.      SKELETON:   No foci of abnormal hypermetabolism in the axial and  visualized appendicular   skeleton.      Impression   1.  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   2.  No hypermetabolic lymphadenopathy or distant metastatic disease.   3.  Mildly enlarged and metabolic left parotid lymph node, likely reactive in   Etiology.                                Spirometry:  Moderate COPD with DLCO of 48%                   Exercise oximetry:  O2 sat on room air at rest is 94%.  After walking for 4 minutes, lowest O2 sat was 91% and maximum heart rate was 156  bpm.         ASSESSMENT/PLAN:  (Medical Decision Making)   Below is the assessment and plan developed based on review of pertinent history, physical exam, labs, studies, and medications.         Diagnoses and all orders for this visit:      1. Lung mass   -     CBC WITH AUTOMATED DIFF; Future   -     METABOLIC PANEL, BASIC; Future   -     PROTHROMBIN TIME + INR; Future   -     PTT; Future   -     CT BX LUNG NDL PERC W IMAGING; Future   -     AFB CULTURE + SMEAR W/RFLX ID FROM CULTURE; Future   -     CULTURE, FUNGUS   -     CULTURE, ANAEROBIC; Future   -     CULTURE, RESPIRATORY/SPUTUM/BRONCH W GRAM STAIN; Future      --reviewed PET scan with Dr. Jerrel Ivory.  Recommends to proceed with IR biopsy--attempt to biopsy the lesion at right posterior pleura.  Need to send cultures.  The 6 mm RLL mass appears to have some necrosis.  Does not appear that  either of these areas are  amendable to nav--nothing to EBUS.      2. Preop examination   -     CBC WITH AUTOMATED DIFF; Future   -     METABOLIC PANEL, BASIC; Future   -     PROTHROMBIN TIME + INR; Future   -     PTT; Future      3. COPD, moderate ([place])   --suboptimally treated.   --start Anoro, one puff every day.   --Patient is given verbal instructions on proper use of prescribed inhaler and is able to give adequate return demonstration.  Inhaler education was indicated due  to new medication.      4. Dyspnea on exertion   --multi-factorial related to exertional tachycardia, poor exercise tolerance, and decreased DLCO.      5. Tachycardia   --referral back to [facility] cardiology for exertional tachycardia, up to rate of 150.   --continue beta blocker for now.           Orders Placed This Encounter        ?  DEMO &/OR EVAL,PT USE,AEROSOL DEVICE- [id number]     ?  AFB CULTURE + SMEAR W/RFLX ID FROM CULTURE              Standing Status:    Future         Standing Expiration Date:    10/02/2020         Order Specific Question:    Specimen source         Answer:    Lung Biopsy [[id number]]        ?  CULTURE, FUNGUS     ?  CULTURE, ANAEROBIC              Standing Status:    Future         Standing Expiration Date:    10/02/2020        ?  CULTURE, RESPIRATORY/SPUTUM/BRONCH W GRAM STAIN              Standing Status:    Future         Standing Expiration Date:    10/02/2020        ?  CT BX LUNG NDL PERC W IMAGING              Standing Status:    Future         Standing Expiration Date:    05/05/2021         Order Specific Question:    Reason for Exam         Answer:    right posterior pleura nodular area with increased PET activity        ?  CBC WITH AUTOMATED DIFF              Standing Status:    Future         Number of Occurrences:    1         Standing Expiration Date:    10/02/2020        ?  METABOLIC PANEL, BASIC              Standing Status:    Future         Number of Occurrences:    1         Standing Expiration Date:    10/02/2020        ?  PROTHROMBIN TIME + INR  Standing Status:    Future         Number of Occurrences:    1         Standing Expiration Date:    10/02/2020        ?  PTT              Standing Status:    Future         Number of Occurrences:    1         Standing Expiration Date:    10/02/2020        ?  REFERRAL TO CARDIOLOGY              Referral Priority:    Routine         Referral Type:    Consultation         Referral Reason:    Specialty Services Required         Number of  Visits Requested:    1        ?  umeclidinium-vilanteroL (Anoro Ellipta) 62.5-25 mcg/actuation inhaler             Sig: Take 1 Puff by inhalation daily.         Dispense:  1 Each             Refill:  11        Follow up with [place] in 3 months with spirometry.      Collaborating physician is Dr. Annamarie Major.      MM         Total time spent was 53 minutes.  This time includes chart prep, review of tests/procedures, review of other provider's notes, documentation, and counseling patient regarding disease process and medications.                 Georgiana Shore, NP   Electronically signed      Dictated using voice recognition software.  Proof read but unrecognized errors may exist.

## 2020-04-05 NOTE — Progress Notes (Signed)
Order for IR biopsy of R nodular area of posterior pleura faxed to IR along with orders for AFB, funal, routine and anaerobic cultures per Mrs Francie Massing.  I have placed order for such in [facility].

## 2020-04-09 ENCOUNTER — Telehealth

## 2020-04-09 NOTE — Telephone Encounter (Signed)
I have spoken with patient. She is aware of IR biopsy on 10/11.  She will arrive at [facility] radiology at 0700.  She will be  NPO and have an adult with her to drive her home.  She will go for COVID testing on 10/7 at 1300.  Confirmed no blood thinners. Orders for additional testing per Mrs Sandrea Hughs is in [place] and faxed to IR.

## 2020-04-12 ENCOUNTER — Encounter

## 2020-04-12 ENCOUNTER — Institutional Professional Consult (permissible substitution): Payer: MEDICARE | Primary: Internal Medicine

## 2020-04-12 DIAGNOSIS — Z20822 Contact with and (suspected) exposure to covid-19: Secondary | ICD-10-CM

## 2020-04-12 NOTE — Progress Notes (Signed)
Patient presented to the [facility] for COVID testing as ordered.  After verbal consent given by patient/caregiver,  nasal swab obtained and sent to lab for processing.

## 2020-04-14 LAB — COVID-19: SARS-CoV-2, NAA: NOT DETECTED

## 2020-04-14 LAB — SARS-COV-2, NAA 2 DAY TAT

## 2020-04-14 LAB — NOVEL CORONAVIRUS (COVID-19): SARS-CoV-2, NAA: NOT DETECTED

## 2020-04-15 NOTE — Telephone Encounter (Signed)
Please let patient know the good news--Covid test was negative.

## 2020-04-16 ENCOUNTER — Inpatient Hospital Stay: Admit: 2020-04-16 | Payer: MEDICARE | Attending: Diagnostic Radiology | Primary: Internal Medicine

## 2020-04-16 ENCOUNTER — Inpatient Hospital Stay: Admit: 2020-04-16 | Payer: MEDICARE | Attending: Nurse Practitioner | Primary: Internal Medicine

## 2020-04-16 DIAGNOSIS — R918 Other nonspecific abnormal finding of lung field: Secondary | ICD-10-CM

## 2020-04-16 MED ORDER — LIDOCAINE HCL 2 % (20 MG/ML) IJ SOLN
20 mg/mL (2 %) | INTRAMUSCULAR | Status: AC
Start: 2020-04-16 — End: ?

## 2020-04-16 MED ORDER — MIDAZOLAM 1 MG/ML IJ SOLN
1 mg/mL | INTRAMUSCULAR | Status: DC | PRN
Start: 2020-04-16 — End: 2020-04-16
  Administered 2020-04-16 (×2): via INTRAVENOUS

## 2020-04-16 MED ORDER — MIDAZOLAM 1 MG/ML IJ SOLN
1 mg/mL | INTRAMUSCULAR | Status: AC
Start: 2020-04-16 — End: ?

## 2020-04-16 MED ORDER — SODIUM CHLORIDE 0.9 % IV
INTRAVENOUS | Status: DC
Start: 2020-04-16 — End: 2020-04-17

## 2020-04-16 MED ORDER — FENTANYL CITRATE (PF) 50 MCG/ML IJ SOLN
50 mcg/mL | INTRAMUSCULAR | Status: DC | PRN
Start: 2020-04-16 — End: 2020-04-16
  Administered 2020-04-16 (×2): via INTRAVENOUS

## 2020-04-16 MED ORDER — MORPHINE 2 MG/ML INJECTION
2 mg/mL | INTRAMUSCULAR | Status: DC | PRN
Start: 2020-04-16 — End: 2020-04-20

## 2020-04-16 MED ORDER — HYDROCODONE-ACETAMINOPHEN 7.5 MG-325 MG TAB
ORAL | Status: AC
Start: 2020-04-16 — End: ?

## 2020-04-16 MED ORDER — HYDROCODONE-ACETAMINOPHEN 7.5 MG-325 MG TAB
ORAL | Status: DC | PRN
Start: 2020-04-16 — End: 2020-04-20
  Administered 2020-04-16: 15:00:00 via ORAL

## 2020-04-16 MED ORDER — SODIUM CHLORIDE 0.9 % IV
Freq: Once | INTRAVENOUS | Status: AC
Start: 2020-04-16 — End: 2020-04-16

## 2020-04-16 MED ORDER — LIDOCAINE HCL 2 % (20 MG/ML) IJ SOLN
20 mg/mL (2 %) | INTRAMUSCULAR | Status: DC | PRN
Start: 2020-04-16 — End: 2020-04-16
  Administered 2020-04-16: 13:00:00 via INTRADERMAL

## 2020-04-16 MED ORDER — FENTANYL CITRATE (PF) 50 MCG/ML IJ SOLN
50 mcg/mL | INTRAMUSCULAR | Status: AC
Start: 2020-04-16 — End: ?

## 2020-04-16 MED FILL — HYDROCODONE-ACETAMINOPHEN 7.5 MG-325 MG TAB: ORAL | Qty: 1

## 2020-04-16 MED FILL — FENTANYL CITRATE (PF) 50 MCG/ML IJ SOLN: 50 mcg/mL | INTRAMUSCULAR | Qty: 4

## 2020-04-16 MED FILL — MIDAZOLAM 1 MG/ML IJ SOLN: 1 mg/mL | INTRAMUSCULAR | Qty: 4

## 2020-04-16 MED FILL — XYLOCAINE 20 MG/ML (2 %) INJECTION SOLUTION: 20 mg/mL (2 %) | INTRAMUSCULAR | Qty: 20

## 2020-04-16 NOTE — Progress Notes (Signed)
 TRANSFER - OUT REPORT:    Verbal report given to Danielle(name) on Audrey Snow  being transferred to IR recovery(unit) for routine progression of care       Report consisted of patient's Situation, Background, Assessment and   Recommendations(SBAR).     Information from the following report(s) SBAR, Procedure Summary and MAR was reviewed with the receiving nurse.    Opportunity for questions and clarification was provided.     Conscious Sedation:   75 Mcg of Fentanyl administered  1.5 Mg of Versed administered    Pt tolerated procedure well.     Visit Vitals  BP (!) 115/58   Pulse (!) 58   Temp 97.6 F (36.4 C)   Resp 14   SpO2 99%   Breastfeeding No     Past Medical History:   Diagnosis Date   . Autoimmune disease (HCC)     RA   . Chronic obstructive pulmonary disease (HCC)     very mild per pt.   . Chronic pain of left knee 06/25/2018   . GERD (gastroesophageal reflux disease)     controlled w/med   . H/O cardiovascular stress test     Bruce protocol, low risk study, EF of 67% and normal LV function   . H/O mammogram 07/28/2016    mammogram and Ultrasound of right breaset- benign finding- [facility]    . Hearing loss of both ears    . HTN (hypertension), benign 01/30/2014    controlled w/med   . Hypothyroid     on med for control   . Menopause    . Osteoarthritis    . Osteoporosis    . 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 [place]. Methotrexate Rx   . Sinusitis    . Unspecified hypothyroidism 01/30/2014   . Vitamin D deficiency      Sal[place] Lock 04/16/20 Right Antecubital (Active)

## 2020-04-16 NOTE — H&P (Signed)
H&P by Vladimir Creeks, MD at 04/16/20 0800                Author: Vladimir Creeks, MD  Service: Physician Assistant  Author Type: Physician       Filed: 04/16/20 0755  Date of Service: 04/16/20 0800  Status: Addendum          Editor: Vladimir Creeks, MD (Physician)          Related Notes: Original Note by Marlene Bast (Physician Assistant) filed at 04/16/20  [phone]                          Department of Interventional Radiology   [phone]      History and Physical          Patient:  Audrey Snow  MRN:  [id number]   SSN:  [id number]          Date of Birth:  Nov 28, 1940   Age:  79 y.o.   Sex:  female         Primary Care Provider:  [name], MD   Referring Physician:  Georgiana Shore, NP        Subjective:        Chief Complaint: biopsy      History of the Present Illness:  The patient is a 79 y.o.  female with a hypermetabolic RLL lung mass concerning for primary lung CA. Remote smoking hx.  COPD.  Doesn't remember parotid biopsy from 2015.   Hx resected right chest mass, reportedly benign.  NPO x meds.   Very HOH.          Past Medical History:        Diagnosis  Date         ?  Autoimmune disease ([place])            RA         ?  Chronic obstructive pulmonary disease ([place])            very mild per pt.         ?  Chronic pain of left knee  06/25/2018     ?  GERD (gastroesophageal reflux disease)            controlled w/med         ?  H/O cardiovascular stress test            Bruce protocol, low risk study, EF of 67% and normal LV function         ?  H/O mammogram  07/28/2016          mammogram and Ultrasound of right breaset- benign finding- [facility]          ?  Hearing loss of both ears       ?  HTN (hypertension), benign  01/30/2014          controlled w/med         ?  Hypothyroid            on med for control         ?  Menopause       ?  Osteoarthritis       ?  Osteoporosis       ?  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 Debbra Riding. Methotrexate  Rx         ?  Sinusitis       ?  Unspecified hypothyroidism  01/30/2014         ?  Vitamin D deficiency            Past Surgical History:         Procedure  Laterality  Date          ?  COLONOSCOPY  N/A  01/27/2018          COLONOSCOPY/BMI 26 performed by Azalia Bilis, MD at [facility]          ?  HX CARPAL TUNNEL RELEASE  Bilateral       ?  HX COLONOSCOPY              colon polyps-adenomatous, has seen Dr. Kristopher Glee in past          ?  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  HX HEMORRHOIDECTOMY         ?  HX OTHER SURGICAL    09/2012          sinus surgery-Dr. Mickle Mallory          ?  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  HX TONSILLECTOMY    79 yrs old     ?  PR COLONOSCOPY FLX DX W/COLLJ SPEC WHEN PFRMD    01/27/2018                       Review of Systems:     Pertinent items are noted in the History of Present Illness.        Prior to Admission medications             Medication  Sig  Start Date  End Date  Taking?  Authorizing Provider            umeclidinium-vilanteroL (Anoro Ellipta) 62.5-25 mcg/actuation inhaler  Take 1 Puff by inhalation daily.  04/05/20      Georgiana Shore, NP     metoprolol succinate (TOPROL-XL) 50 mg XL tablet  Take 50 mg by mouth daily.  02/07/20  02/06/21    Provider, Historical     atorvastatin (LIPITOR) 20 mg tablet  Take 20 mg by mouth daily.  01/17/20  01/16/21    Provider, Historical     ergocalciferol (ERGOCALCIFEROL) 1,250 mcg (50,000 unit) capsule  Take 50,000 Units by mouth every seven (7) days.  01/09/20      Provider, Historical     mupirocin (BACTROBAN) 2 % ointment  Apply  to affected area daily.  02/02/20      [name], MD     levothyroxine (SYNTHROID) 100 mcg tablet  Take 1 Tablet by mouth Daily (before breakfast).  01/25/20      [name], MD     gabapentin (NEURONTIN) 100 mg capsule  Take 100 mg by mouth three (3) times daily.  10/05/19      Provider, Historical     albuterol (PROVENTIL  HFA, VENTOLIN HFA, PROAIR HFA) 90 mcg/actuation inhaler  Take 1 Puff by inhalation every six (6) hours as needed for Wheezing.  07/05/19      [name], MD     losartan (COZAAR)  100 mg tablet  TAKE 1 TABLET BY MOUTH DAILY  07/05/19      [name], MD     fluticasone propionate (FLONASE) 50 mcg/actuation nasal spray  2 Sprays by Both Nostrils route daily.  04/05/19      [name], MD     bacitracin-neomycin-polymyxin b-hydrocortisone (CORTISPORIN) 1 % ointment  Use samll amount 3-4 times a day.  06/25/18      [name], MD     denosumab (PROLIA) 60 mg/mL injection  60 mg by SubCUTAneous route.   Patient not taking: Reported on 04/05/2020        Provider, Historical     Cholecalciferol, Vitamin D3, 50,000 unit cap  Take 1 Cap by mouth every Monday.        Provider, Historical     methotrexate, PF, 25 mg/mL injection  25 mg every seven (7) days.        Provider, Historical     nicotinic acid (NIACIN) 500 mg tablet  Take 500 mg by mouth Daily (before breakfast). 2 po everyday.         Provider, Historical     calcium-cholecalciferol, d3, (CALCIUM 600 + D) 600-125 mg-unit tab  Take 2 Tabs by mouth.        Provider, Historical     multivitamin (ONE A DAY) tablet  Take 1 Tab by mouth daily.        Provider, Historical            folic acid (FOLVITE) 1 mg tablet  Take  by mouth daily.        Provider, Historical              Allergies        Allergen  Reactions         ?  Yeast  Nausea and Vomiting             Family History         Problem  Relation  Age of Onset          ?  Arthritis-osteo  Mother       ?  Alzheimer  Father            ?  Breast Cancer  Neg Hx            Social History          Tobacco Use         ?  Smoking status:  Former Smoker              Packs/day:  0.50         Years:  43.00         Pack years:  21.50         Types:  Cigarettes         Start date:  1961         Quit date:  1985         Years since quitting:  36.8         ?  Smokeless tobacco:  Never Used         ?  Tobacco comment: not regular when first started- 1pk/week       Substance Use Topics         ?  Alcohol use:  No              Alcohol/week:  0.0 standard drinks  Objective:          Physical Examination:       Vitals:          04/16/20 0736        BP:  134/78     Pulse:  65     Resp:  16     Temp:  97.6 ??F (36.4 ??C)        SpO2:  94%           Pain Assessment          HEART: regular rate and rhythm   LUNG: clear to auscultation bilaterally   ABDOMEN: normal findings: soft, non-tender   EXTREMITIES: warm, no edema      Laboratory:        Lab Results         Component  Value  Date/Time            Sodium  138  04/05/2020 11:46 AM       Sodium  139  01/03/2020 02:25 PM       Potassium  3.9  04/05/2020 11:46 AM       Potassium  4.3  01/03/2020 02:25 PM       Chloride  101  04/05/2020 11:46 AM       Chloride  100  01/03/2020 02:25 PM       CO2  28  04/05/2020 11:46 AM       CO2  22  01/03/2020 02:25 PM       Anion gap  9  04/05/2020 11:46 AM       Anion gap  7  08/23/2012 12:37 PM       Glucose  103 (H)  04/05/2020 11:46 AM       Glucose  93  01/03/2020 02:25 PM       BUN  9  04/05/2020 11:46 AM       BUN  14  01/03/2020 02:25 PM       Creatinine  0.79  04/05/2020 11:46 AM       Creatinine  0.70  01/03/2020 02:25 PM       GFR est AA  >60  04/05/2020 11:46 AM       GFR est AA  95  01/03/2020 02:25 PM       GFR est non-AA  >60  04/05/2020 11:46 AM       GFR est non-AA  82  01/03/2020 02:25 PM       Calcium  9.8  04/05/2020 11:46 AM       Calcium  9.3  01/03/2020 02:25 PM       Albumin  4.4  01/03/2020 02:25 PM       Albumin  4.6  07/05/2019 12:09 PM       Protein, total  7.0  01/03/2020 02:25 PM       Protein, total  6.9  07/05/2019 12:09 PM       A-G Ratio  1.7  01/03/2020 02:25 PM       A-G Ratio  2.0  07/05/2019 12:09 PM       ALT (SGPT)  18  01/03/2020 02:25 PM            ALT (SGPT)  21  07/05/2019 12:09 PM          Lab Results         Component  Value  Date/Time  WBC  9.8  04/05/2020 11:46  AM       WBC  6.7  01/03/2020 02:25 PM       HGB  13.3  04/05/2020 11:46 AM       HGB  13.9  01/03/2020 02:25 PM       HCT  41.7  04/05/2020 11:46 AM       HCT  41.4  01/03/2020 02:25 PM       PLATELET  341  04/05/2020 11:46 AM            PLATELET  257  01/03/2020 02:25 PM          Lab Results         Component  Value  Date/Time            aPTT  28.9  04/05/2020 11:46 AM       Prothrombin time  13.5  04/05/2020 11:46 AM            INR  1.0  04/05/2020 11:46 AM             Assessment:        Hypermetabolic RLL lung mass 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           Signed By:  Oval Linsey, PA-C           April 16, 2020

## 2020-04-16 NOTE — Procedures (Signed)
Procedures by Vladimir Creeks, MD at 04/16/20 0800                Author: Vladimir Creeks, MD  Service: --  Author Type: Physician       Filed: 04/16/20 0845  Date of Service: 04/16/20 0800  Status: Signed          Editor: Vladimir Creeks, MD (Physician)                    Department of Interventional Radiology   [phone]          Interventional Radiology Brief Procedure Note          Patient: Audrey Snow  MRN: [id number]   SSN: [id number]          Date of Birth: [birth year]   Age: 79 y.o.   Sex: female         Date of Procedure: 04/16/2020      Pre-Procedure Diagnosis: RLL lung mass.  COPD.        Post-Procedure Diagnosis: SAME      Procedure(s): Image Guided Biopsy      Brief Description of Procedure: as above      Performed By: Vladimir Creeks, MD       Assistants: None      Anesthesia:Moderate Sedation      Estimated Blood Loss: Less than 73ml      Specimens:  Microbiology and Pathology.       Implants:  None      Findings: No PTX.        Complications: None      Recommendations: 4 hour bedrest.  CXR in 3 hours.  NPO.          Follow Up: Gunnar Fusi, NP         Signed By:  Vladimir Creeks, MD           April 16, 2020

## 2020-04-16 NOTE — Progress Notes (Signed)
Dr. Sheria Lang presented case at tumor board today.  Needs referral to med onc and back to surgery.

## 2020-04-16 NOTE — Progress Notes (Signed)
To CT via stretcher.  Name and DOB verified.  Transferred prone to exam table, secured and monitor attached,

## 2020-04-17 ENCOUNTER — Encounter: Payer: MEDICARE | Attending: Surgery | Primary: Internal Medicine

## 2020-04-18 LAB — CULTURE, TISSUE W GRAM STAIN
Culture result:: NO GROWTH
Culture: NO GROWTH
GRAM STAIN: 0
GRAM STAIN: NONE SEEN
Gram Stain Result: 0
Gram Stain Result: NONE SEEN

## 2020-04-19 ENCOUNTER — Telehealth

## 2020-04-19 NOTE — Telephone Encounter (Signed)
 Patient discussed at thoracic conference 04/18/2020 by Dr Tamsen with Dr Georgine Lofty present. Patient has RLL mass that was found during workup for syncopal episode.  PET scan showed + mass and pleural nodule.  Per group mass is 5 cm and discussion if it is invading chest wall.  Per Group discussion surgical resection of mass-patient already has follow up with Dr Tamsen scheduled on 10/22 for further discussion.  IR BX is diagnostic for       RIGHT LUNG MASS: IMMUNOHISTOCHEMICAL FINDINGS MOST CONSISTENT WITH   ADENOCARCINOMA OF LUNG ORIGIN.   tls/04/17/2020     Dr Jestine has ordered referral to medical oncology to discuss adjuvant chemotherapy.  Approval from Mrs Lofty to discuss results with patient.  I have left her a message to return my call.

## 2020-04-19 NOTE — Telephone Encounter (Signed)
Oncology appointment is scheduled.

## 2020-04-19 NOTE — Telephone Encounter (Signed)
 I have returned call to patient.  She is aware of results of IR BX of lung  DIAGNOSIS      RIGHT LUNG MASS: IMMUNOHISTOCHEMICAL FINDINGS MOST CONSISTENT WITH   ADENOCARCINOMA OF LUNG ORIGIN.     She is aware of thoracic conference discussion and recommended plan for surgical resection in addition to adjuvant chemotherapy.  We have discussed what adjuvant therapy is and role in her care.  She is agreeable and I have spoken with [name] at cancer center.  She will seek assistance from nursing staff to get patient worked in for appointment soon.  Audrey Snow is aware to expect a call from oncology to schedule appointment.

## 2020-04-19 NOTE — Telephone Encounter (Signed)
[phone] please call back.

## 2020-04-23 LAB — CULTURE, ANAEROBIC

## 2020-04-24 NOTE — Progress Notes (Signed)
 New Patient Abstract    Reason for Referral: Adenocarcinoma of right lung    Referring Provider:  Jestine Caron PARAS, MD    Primary Care Provider: Solon Jori PARAS, MD    Family History of Cancer/Hematologic Disorders: None noted    Presenting Symptoms: chest pain, SOB, n/v    Narrative with recent with Results/Procedures/Biopsies and Dates completed:   Audrey Snow is an 79 year old Caucasian female who reports to be a former smoker of 0.5 pack per day of cigarettes for 43 years that quit in 1985. She denies use of alcohol or drug substances. Her medical history reports as vitamin D deficiency, hypothyroidism, sinusitis, RA, osteoporosis, osteoarthritis, menopause, HTN, HOH, GERD, COPD and chronic pain. Her surgical history reports as colonoscopy, tonsillectomy, sinus, thymoma, hemorrhoidectomy, endoscopy and carpal tunnel release.  On 02/27/20, Audrey Snow presented to [facility] ED with complaints of achiness in chest with radiation of pain to her left upper extremity with association of n/v/SOB and diaphoresis. She reports her cardiac work-up was negative however her chest x-ray reported a 5.1 cm rounded opacity at the right lung base. She was discharged home to have PCP follow up. She saw her PCP on 02/29/20 and order placed for CT of the chest. Her CT scan reported a 5 cm mass with adjacent pleural nodularity posteriorly at the right lung base and considered suspicious for bronchogenic carcinoma. She saw Dr Tamsen on 03/20/20 for her lung mass. A referral to pulmonary was made. On 04/02/20 she had a PET/CT scan that reported a 6 mm right lower lobe pulmonary mass demonstrates peripheral nodular hypermetabolic activity (max SUV 8.9), concerning for primary lung neoplasm with central necrosis.  No hypermetabolic lymphadenopathy or distant metastatic disease was noted however mildly enlarged and metabolic left parotid lymph node which was likely reactive in etiology. On 04/16/20 she underwent a biopsy of the RLL mass by Dr  Rox. The pathology reported as immunohistochemical findings most consistent with   adenocarcinoma of lung origin. The TTF-1 IHC staining reported as positive. She is to see Dr Tamsen on 04/27/20 for discussion of mass resection surgery. A referral was placed to medical oncology for evaluation of adjuvant chemotherapy.      02/27/20 Chest X-ray  IMPRESSION:   1. There is a new 5.1 cm rounded opacity at the right lung base. Recommend CT for further evaluation.    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  THE AS  WOULD BE AMENABLE TO PERCUTANEOUS CORE NEEDLE BIOPSY WITH CT GUIDANCE.  IF  FURTHER IMAGING EVALUATION IS CLINICALLY INDICATED AT THIS TIME, THEN PET/CT  WOULD BE MOST USEFUL    04/02/20 PET/CT  FINDINGS:  HEAD/NECK:   Mildly hypermetabolic nodule within the superficial left parotid gland, favored  to represent a lymph node. No other hypermetabolic cervical lymphadenopathy.  Cerebral evaluation is limited by normal intense activity.  CHEST:   Redemonstrated 6 cm pulmonary mass within the right lower lobe which  demonstrates peripheral nodular hypermetabolic activity (max SUV 8.9).  Additional nodular area inferiorly along the right posterior pleura which  demonstrates increased metabolic activity (max SUV 8.9). No hypermetabolic hilar  or mediastinal lymphadenopathy. Scattered calcified atherosclerotic disease  ABDOMEN/PELVIS:   No foci of abnormal hypermetabolism. Suspected physiologic uptake within the  gastrointestinal and genitourinary tracts.  SKELETON:   No  foci of abnormal hypermetabolism in the axial and visualized appendicular  skeleton.  IMPRESSION  1.  The 6 mm right lower lobe pulmonary mass demonstrates peripheral nodular  hypermetabolic activity, concerning for primary lung neoplasm  with central  necrosis.  2.  No hypermetabolic lymphadenopathy or distant metastatic disease.  3.  Mildly enlarged and metabolic left parotid lymph node, likely reactive in  etiology.    04/16/20 [facility]  RIGHT LUNG MASS:  IMMUNOHISTOCHEMICAL FINDINGS MOST CONSISTENT WITH   ADENOCARCINOMA OF LUNG ORIGIN.                     [facility]-IMMUNOHISTOCHEMISTRY                          Interpretation                       Immunohistochemical Stain Panel:        Interpretation:  Immunohistochemical findings most consistent with   adenocarcinoma of lung origin.     Antibody/Test               Marker For                                                                Result   CK 5/6                    Squamous cell carcinomas and mesotheliomas      Negative   p40                         Squamous differentiation                                                Negative   TTF-1                     Lung and thyroid adenocarcinoma                              Positive   Napsin                     Lung adenocarcinoma                                                    Negative        Notes from Referring Provider: "To discuss adjuvant chemotherapy"    Other Pertinent Information: 2015 h/o Left parotid gland mass biopsy- benign ([facility])    Presented at Tumor Board: Yes on 04/18/20 by Dr Tamsen

## 2020-04-25 ENCOUNTER — Ambulatory Visit: Attending: Internal Medicine | Primary: Internal Medicine

## 2020-04-25 ENCOUNTER — Ambulatory Visit: Admit: 2020-04-25 | Discharge: 2020-04-25 | Payer: MEDICARE | Attending: Internal Medicine | Primary: Internal Medicine

## 2020-04-25 DIAGNOSIS — C3491 Malignant neoplasm of unspecified part of right bronchus or lung: Secondary | ICD-10-CM

## 2020-04-25 NOTE — Progress Notes (Signed)
Progress Notes by Greer Ee, MD at 04/25/20 0930                Author: Greer Ee, MD  Service: --  Author Type: Physician       Filed: 04/26/20 1249  Encounter Date: 04/25/2020  Status: Signed          Editor: Greer Ee, MD (Physician)               [facility] Hematology and Oncology: Office Visit New Patient H & P      Chief Complaint:       Chief Complaint       Patient presents with        ?  New Patient              History of Present Illness:   Audrey Snow is a  79 y.o. 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   On 02/27/20, Audrey Snow presented to [facility] ED with complaints of achiness in chest with radiation of pain to her left upper extremity with association of N/V, SOB and diaphoresis. Cardiac work-up was negative; however her chest x-ray reported a 5.1 cm  rounded opacity at the right lung base.  She saw her PCP on 02/29/20, CT scan was ordered and reported a 5 cm mass with adjacent pleural nodularity posteriorly at the right lung base and considered suspicious for bronchogenic carcinoma. On 04/02/20 she  had a PET/CT scan that reported a 6 cm right lower lobe pulmonary mass with no metastatic disease.  Biopsy showed adenocarcinoma of lung origin.  She is to see Dr Sheria Lang on 04/27/20 for discussion of right lower lobectomy. A referral was placed to medical  oncology for evaluation of adjuvant chemotherapy.  She is feeling well, no current complaints.       Review of Systems:   Constitutional: Negative.    HENT: Negative.    Eyes: Negative.    Respiratory: Negative.    Cardiovascular: Negative.    Gastrointestinal: Negative.    Genitourinary: Negative.    Musculoskeletal: Negative.    Skin: Negative.    Neurological: Negative.    Endo/Heme/Allergies: Negative.    Psychiatric/Behavioral: Negative.    All other systems reviewed and are negative.         Allergies        Allergen  Reactions         ?  Yeast  Nausea and Vomiting          Past Medical  History:        Diagnosis  Date         ?  Autoimmune disease ([place])            RA         ?  Chronic obstructive pulmonary disease ([place])            very mild per pt.         ?  Chronic pain of left knee  06/25/2018     ?  GERD (gastroesophageal reflux disease)            controlled w/med         ?  H/O cardiovascular stress test            Bruce protocol, low risk study, EF of 67% and normal LV function         ?  H/O mammogram  07/28/2016  mammogram and Ultrasound of right breaset- benign finding- [facility]          ?  Hearing loss of both ears       ?  HTN (hypertension), benign  01/30/2014          controlled w/med         ?  Hypothyroid            on med for control         ?  Menopause       ?  Osteoarthritis       ?  Osteoporosis       ?  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 Debbra Riding. Methotrexate Rx         ?  Sinusitis       ?  Unspecified hypothyroidism  01/30/2014         ?  Vitamin D deficiency            Past Surgical History:         Procedure  Laterality  Date          ?  COLONOSCOPY  N/A  01/27/2018          COLONOSCOPY/BMI 26 performed by Azalia Bilis, MD at [place]          ?  HX CARPAL TUNNEL RELEASE  Bilateral       ?  HX COLONOSCOPY              colon polyps-adenomatous, has seen Dr. Kristopher Glee in past          ?  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  HX HEMORRHOIDECTOMY         ?  HX OTHER SURGICAL    09/2012          sinus surgery-Dr. Mickle Mallory          ?  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  HX TONSILLECTOMY    79 yrs old     ?  PR COLONOSCOPY FLX DX W/COLLJ SPEC WHEN PFRMD    01/27/2018                     Family History         Problem  Relation  Age of Onset          ?  Arthritis-osteo  Mother       ?  Alzheimer  Father            ?  Breast Cancer  Neg Hx            Social History          Socioeconomic History         ?  Marital status:  MARRIED              Spouse name:  Not on file          ?  Number of children:  Not on file     ?  Years of education:  Not on file     ?  Highest education level:  Not on file  Occupational History        ?  Not on file       Tobacco Use         ?  Smoking status:  Former Smoker              Packs/day:  0.50         Years:  43.00         Pack years:  21.50         Types:  Cigarettes         Start date:  1961         Quit date:  1985         Years since quitting:  36.8         ?  Smokeless tobacco:  Never Used        ?  Tobacco comment: not regular when first started- 1pk/week       Substance and Sexual Activity         ?  Alcohol use:  No              Alcohol/week:  0.0 standard drinks         ?  Drug use:  No     ?  Sexual activity:  Not on file        Other Topics  Concern        ?  Not on file       Social History Narrative          Married and lives with husband.  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          Social Determinants of Health          Financial Resource Strain:         ?  Difficulty of Paying Living Expenses:        Food Insecurity:         ?  Worried About Charity fundraiser in the Last Year:      ?  Arboriculturist in the Last Year:        Transportation Needs:         ?  Film/video editor (Medical):      ?  Lack of Transportation (Non-Medical):        Physical Activity:         ?  Days of Exercise per Week:      ?  Minutes of Exercise per Session:        Stress:         ?  Feeling of Stress :        Social Connections:         ?  Frequency of Communication with Friends and Family:      ?  Frequency of Social Gatherings with Friends and Family:      ?  Attends Religious Services:      ?  Active Member of Clubs or Organizations:      ?  Attends Archivist Meetings:      ?  Marital Status:        Intimate Partner Violence:         ?  Fear of Current or Ex-Partner:      ?  Emotionally Abused:      ?  Physically Abused:         ?  Sexually Abused:  Current Outpatient Medications          Medication  Sig  Dispense  Refill            ?  umeclidinium-vilanteroL (Anoro Ellipta) 62.5-25 mcg/actuation inhaler  Take 1 Puff by inhalation daily.  1 Each  11     ?  metoprolol succinate (TOPROL-XL) 50 mg XL tablet  Take 50 mg by mouth daily.         ?  atorvastatin (LIPITOR) 20 mg tablet  Take 20 mg by mouth daily.         ?  ergocalciferol (ERGOCALCIFEROL) 1,250 mcg (50,000 unit) capsule  Take 50,000 Units by mouth every seven (7) days.         ?  mupirocin (BACTROBAN) 2 % ointment  Apply  to affected area daily.  15 g  1     ?  levothyroxine (SYNTHROID) 100 mcg tablet  Take 1 Tablet by mouth Daily (before breakfast).  30 Tablet  11     ?  gabapentin (NEURONTIN) 100 mg capsule  Take 100 mg by mouth three (3) times daily.               ?  albuterol (PROVENTIL HFA, VENTOLIN HFA, PROAIR HFA) 90 mcg/actuation inhaler  Take 1 Puff by inhalation every six (6) hours as needed for Wheezing.  1 Inhaler  5           ?  losartan (COZAAR) 100 mg tablet  TAKE 1 TABLET BY MOUTH DAILY  90 Tab  3     ?  fluticasone propionate (FLONASE) 50 mcg/actuation nasal spray  2 Sprays by Both Nostrils route daily.  1 Bottle  5     ?  bacitracin-neomycin-polymyxin b-hydrocortisone (CORTISPORIN) 1 % ointment  Use samll amount 3-4 times a day.  15 g  5     ?  denosumab (PROLIA) 60 mg/mL injection  60 mg by SubCUTAneous route.         ?  Cholecalciferol, Vitamin D3, 50,000 unit cap  Take 1 Cap by mouth every Monday.         ?  methotrexate, PF, 25 mg/mL injection  25 mg every seven (7) days.         ?  nicotinic acid (NIACIN) 500 mg tablet  Take 500 mg by mouth Daily (before breakfast). 2 po everyday.          ?  calcium-cholecalciferol, d3, (CALCIUM 600 + D) 600-125 mg-unit tab  Take 2 Tabs by mouth.         ?  multivitamin (ONE A DAY) tablet  Take 1 Tab by mouth daily.               ?  folic acid (FOLVITE) 1 mg tablet  Take  by mouth daily.               OBJECTIVE:   Visit Vitals      BP  (!) 146/57     Pulse  (!) 56     Temp  97.7 ??F (36.5 ??C)     Resp  16         Ht  5'  5.5" (1.664 m)  Comment: taken w/o shoes        Wt  153 lb 14.4 oz (69.8 kg)     SpO2  96%        BMI  25.22 kg/m??  Physical Exam:      Constitutional:  Well developed, well nourished female in no acute  distress, sitting comfortably on the examination table.         HEENT:  Normocephalic and atraumatic. Sclerae anicteric. Neck supple without JVD. No thyromegaly present.      Lymph node     No palpable submandibular, cervical, supraclavicular lymph nodes.     Skin  Warm and dry.  No bruising and no rash noted.  No erythema.  No pallor.      Respiratory  Lungs are clear to auscultation bilaterally without wheezes, rales or rhonchi, normal air exchange without accessory muscle use.      CVS  Normal rate, regular rhythm and normal S1 and S2.  No murmurs, gallops, or rubs.     Abdomen  Soft, nontender and nondistended, normoactive bowel sounds.  No palpable mass.  No hepatosplenomegaly.     Neuro  Grossly nonfocal with no obvious sensory or motor deficits.     MSK  Normal range of motion in general.  No edema and no tenderness.        Psych  Appropriate mood and affect.         Labs:   No results found for this or any previous visit (from the past 24 hour(s)).      Imaging:   EXAM: PET/CT TUMOR IMAGE SKULL THIGH W (INI)   ??   INDICATION: highly suspicious for lung cancer.   ??   PROCEDURE: Following IV injection of 13.91 mCi 18 Fluoro 2 deoxyglucose (FDG)   and a standard uptake delay, PET imaging is performed from head to thighs and   axial, sagittal and coronal images were acquired. Unenhanced CT is obtained for   anatomic localization, and attenuation correction of the PET scan. Patient   preprocedure blood glucose level: 1:15mg /dL.   ??   CORRELATIVE IMAGING STUDIES: CT chest dated 02/29/2020.   ??   COMPARISON PET: No prior PET/CT   ??   HISTORY: The study is requested for initial treatment strategy. .   ??   FINDINGS:   ??   HEAD/NECK:    Mildly hypermetabolic nodule within the superficial left parotid gland,  favored   to represent a lymph node. No other hypermetabolic cervical lymphadenopathy.   Cerebral evaluation is limited by normal intense activity.   ??   CHEST:    Redemonstrated 6 cm pulmonary mass within the right lower lobe which   demonstrates peripheral nodular hypermetabolic activity (max SUV 8.9).   Additional nodular area inferiorly along the right posterior pleura which   demonstrates increased metabolic activity (max SUV 8.9). No hypermetabolic hilar   or mediastinal lymphadenopathy. Scattered calcified atherosclerotic disease   ??   ABDOMEN/PELVIS:    No foci of abnormal hypermetabolism. Suspected physiologic uptake within the   gastrointestinal and genitourinary tracts.   ??   SKELETON:    No foci of abnormal hypermetabolism in the axial and visualized appendicular   skeleton.   ??   IMPRESSION   1.  The 6 cm right lower lobe pulmonary mass demonstrates peripheral nodular   hypermetabolic activity, concerning for primary lung neoplasm with central   necrosis.   2.  No hypermetabolic lymphadenopathy or distant metastatic disease.   3.  Mildly enlarged and metabolic left parotid lymph node, likely reactive in   Etiology.         Pathology:   * * *DIAGNOSIS* * * * * * * * * * * * * * * * * * * * * * * * * * * * * * * ??     ???? ?? "  RIGHT LUNG MASS": ??IMMUNOHISTOCHEMICAL FINDINGS MOST CONSISTENT  WITH   ADENOCARCINOMA OF LUNG ORIGIN.           tls/04/17/2020     * * *Electronically Signed Out* * * ?? ?? ??   Sign Out Date: 04/18/2020 ??Charles A. Renella Cunas, MD           * * *PROCEDURES/ADDENDA*  * * * * * * * * * * * * * * * * * * * * ??* * * * ??     ???? ?? ?? ?? ?? ?? ?? ?? ??STF-IMMUNOHISTOCHEMISTRY     ???? ?? ?? ?? ?? ?? ?? ?? ?? ?? ?? ?? ?? ?? ?? ?? ?? ?? ??  ?? ?? ?? ?? ?? ?? ?? ?? ?? ?? ?? ?? ?? ?? ?? ?? ?? ??   ??Status: ??Reviewed By:   ??Charles A. Lorel Monaco, III, MD on 04/18/2020   ???? ??   ???? ?? ?? ?? ?? ?? ?? ?? ??  ?? ?? Interpretation   ???? ?? ?? ?? ?? ?? ?? ?? ?? ??Immunohistochemical Stain Panel:     ???? ?? Interpretation: ??Immunohistochemical findings most consistent  with   adenocarcinoma of lung origin.      Antibody/Test ?? ?? ?? ?? ?? ?? ?? Marker For ?? ?? ?? ?? ?? ?? ?? ?? ?? ?? ?? ?? ?? ?? ??Result   CK 5/6 ?? ?? ?? ?? ?? ?? ?? ?? ?? ??Squamous cell carcinomas and mesotheliomas ??  ?? ??   ???? ??Negative   p40 ?? ?? ?? ?? ?? ?? ?? ?? ?? ??Squamous differentiation ?? ?? ?? ?? ?? ?? ?? ?? ?? ??   Negative   TTF-1 ?? ?? ?? ?? ?? ?? ?? ?? ?? ?? Lung  and thyroid adenocarcinoma ?? ?? ?? ?? ?? ?? ??   Positive   Napsin ?? ?? ?? ?? ?? ?? ?? ?? ?? ?? Lung adenocarcinoma ?? ?? ?? ?? ?? ?? ?? ?? ?? ?? ?? ??   Negative ??                ASSESSMENT:             ICD-10-CM  ICD-9-CM             1.  Adenocarcinoma of right lung ([place])   C34.91  162.9             Problem List   Date Reviewed:  May 02, 2020                    Codes  Class  Noted             Right lower lobe lung mass  ICD-10-CM: R91.8   ICD-9-CM: 786.6    03/20/2020                       Dry skin dermatitis  ICD-10-CM: L85.3   ICD-9-CM: 692.89    01/03/2020                       Bilateral leg cramps  ICD-10-CM: R25.2   ICD-9-CM: 729.82    01/03/2020                       Elevated blood sugar  ICD-10-CM: R73.9   ICD-9-CM: 790.29    01/03/2020  Hyperlipidemia  ICD-10-CM: E78.5   ICD-9-CM: 272.4    01/04/2019                       Chronic obstructive pulmonary disease ([place])  ICD-10-CM: J44.9   ICD-9-CM: 433    01/04/2019                       Pre-ulcerative corn or callous  ICD-10-CM: L84   ICD-9-CM: 700    06/25/2018                       Chronic pain of left knee  ICD-10-CM: M25.562, G89.29   ICD-9-CM: 719.46, 338.29    01/26/2018          Overview Signed 02/29/2020 12:10 PM by Selinda Orion, NP            Last Assessment & Plan:    Formatting of this note might be different from the original.   Discussed in detail today.  We will see if we can get Visco therapy approved.  Referral to PT.                                   Prediabetes  ICD-10-CM: R73.03   ICD-9-CM: 790.29    06/10/2016                       Chronic right-sided low back pain without sciatica  ICD-10-CM: M54.50, G89.29   ICD-9-CM: 724.2, 338.29     02/12/2016                       Environmental allergies  ICD-10-CM: Z91.09   ICD-9-CM: V15.09    10/05/2014                       Rheumatoid arthritis involving multiple sites with positive rheumatoid factor (HCC)  ICD-10-CM: M05.79   ICD-9-CM: 714.0    01/30/2014          Overview Signed 02/29/2020 12:10 PM by Selinda Orion, NP            Last Assessment & Plan:    Formatting of this note might be different from the original.   Chronic/Stable. Denies any recent RA flares. Patient is taking MTX and folic acid. Denies any adverse side effects. Continue with current treatment plan. Reviewed previous labs. Ordered labs today. Will continue to monitor over time.      Instructed patient to contact our office with any new or worsening symptoms.                                   Hypothyroidism  ICD-10-CM: E03.9   ICD-9-CM: 244.9    01/30/2014                       Vitamin D deficiency  ICD-10-CM: E55.9   ICD-9-CM: 268.9    01/30/2014          Overview Signed 02/29/2020 12:10 PM by Selinda Orion, NP            Last Assessment & Plan:    Formatting of this note might be different from the original.   Encouraged appropriate vitamin D supplementation. Monitor  for signs or symptoms of hypercalcemia.                                   Osteoporosis  ICD-10-CM: M81.0   ICD-9-CM: 733.00    01/30/2014                       Gastroesophageal reflux disease without esophagitis  ICD-10-CM: K21.9   ICD-9-CM: 530.81    01/30/2014                       HTN (hypertension)  ICD-10-CM: I10   ICD-9-CM: 401.9    01/30/2014                             PLAN:   Lab studies and imaging studies were personally reviewed.      Pertinent old records were reviewed.      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  PET/CT shows no distant disease.  Clinically T3N0M0.  I discussed the pathophysiology and natural history of lung cancer with  Audrey Snow.  She is to see Dr. Sheria Lang on 10/22, her  case has already been presented at tumor board  and it seems that she will be offered surgical therapy (RL lobectomy/lymphadenectomy).  Once she has lobectomy, we will review her surgical pathology and make recommendations.  If she does have T3N0 disease or higher, we would consider adjuvant chemotherapy,  likely cisplatin and pemetrexed for 4 cycles.  We do also have the ALCHEMIST trial for which she may be a candidate.  We will arrange for follow-up in several weeks after (presumably) surgical management completed.  All questions were asked and answered  to the best of my ability.  In all, I spent 60 minutes in the care of Audrey Snow  today, over 50% of which was in direct counseling and coordination of care.                 Greer Ee, MD   [facility]ematology [place] [address]   Office : [phone]   Fax : [phone]

## 2020-04-27 ENCOUNTER — Ambulatory Visit: Attending: Surgery | Primary: Internal Medicine

## 2020-04-27 ENCOUNTER — Ambulatory Visit: Admit: 2020-04-27 | Discharge: 2020-04-27 | Payer: MEDICARE | Attending: Surgery | Primary: Internal Medicine

## 2020-04-27 ENCOUNTER — Encounter

## 2020-04-27 DIAGNOSIS — C3491 Malignant neoplasm of unspecified part of right bronchus or lung: Secondary | ICD-10-CM

## 2020-04-27 NOTE — Progress Notes (Signed)
[facility]  [address], [address]  [place] [address]  [phone]     04/27/2020  Patient:  Audrey Snow  DOB: 1940/08/19    HPI  AKIBA MELFI is a 79 y.o. female who is back to discuss biopsy result and surgery. She had had percutaneous biopsy, which confirmed right lung cancer. PET shows no metastatic disease. Her PFT is reasonable to allow surgery.     On 03/20/2020, she was referred by Clista Bernhardt NP for evaluation of a lung mass involving the Right lower lobe. This is suspicious appearing 5cm mass.  She has not had any further workup with exception to the CT scan.  This appears to be incidental finding during the workup for a near syncope episode a few weeks ago. She has some front chest pain, and shortness of breath. She has been worked up by cardiology for arrhythmia.   ??   She  does have a history of smoking remotely, quit 40 years ago. She does not have a history of drinking. She is very hard hearing. Other medical issues are reviewed as below. She had right thoracoscopy to remove a mass in chest years ago, she believes it was benign. She does not have a family history of cancer.   does not take blood thinner.  ??  ??      Past Medical History:   Diagnosis Date   ??? Autoimmune disease ([place])     RA   ??? Chronic obstructive pulmonary disease ([place])     very mild per pt.   ??? Chronic pain of left knee 06/25/2018   ??? GERD (gastroesophageal reflux disease)     controlled w/med   ??? H/O cardiovascular stress test     [facility] protocol, low risk study, EF of 67% and normal LV function   ??? H/O mammogram 07/28/2016    mammogram and Ultrasound of right breaset- benign finding- [facility]    ??? Hearing loss of both ears    ??? HTN (hypertension), benign 01/30/2014    controlled w/med   ??? Hypothyroid     on med for control   ??? Menopause    ??? Osteoarthritis    ??? Osteoporosis    ??? 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 Debbra Riding. Methotrexate Rx   ??? Sinusitis    ??? Unspecified hypothyroidism  01/30/2014   ??? Vitamin D deficiency      Current Outpatient Medications   Medication Sig Dispense Refill   ??? umeclidinium-vilanteroL (Anoro Ellipta) 62.5-25 mcg/actuation inhaler Take 1 Puff by inhalation daily. 1 Each 11   ??? metoprolol succinate (TOPROL-XL) 50 mg XL tablet Take 50 mg by mouth daily.     ??? atorvastatin (LIPITOR) 20 mg tablet Take 20 mg by mouth daily.     ??? ergocalciferol (ERGOCALCIFEROL) 1,250 mcg (50,000 unit) capsule Take 50,000 Units by mouth every seven (7) days.     ??? mupirocin (BACTROBAN) 2 % ointment Apply  to affected area daily. 15 g 1   ??? levothyroxine (SYNTHROID) 100 mcg tablet Take 1 Tablet by mouth Daily (before breakfast). 30 Tablet 11   ??? gabapentin (NEURONTIN) 100 mg capsule Take 100 mg by mouth three (3) times daily.     ??? albuterol (PROVENTIL HFA, VENTOLIN HFA, PROAIR HFA) 90 mcg/actuation inhaler Take 1 Puff by inhalation every six (6) hours as needed for Wheezing. 1 Inhaler 5   ??? losartan (COZAAR) 100 mg tablet TAKE 1 TABLET BY MOUTH DAILY  90 Tab 3   ??? fluticasone propionate (FLONASE) 50 mcg/actuation nasal spray 2 Sprays by Both Nostrils route daily. 1 Bottle 5   ??? bacitracin-neomycin-polymyxin b-hydrocortisone (CORTISPORIN) 1 % ointment Use samll amount 3-4 times a day. 15 g 5   ??? denosumab (PROLIA) 60 mg/mL injection 60 mg by SubCUTAneous route.     ??? Cholecalciferol, Vitamin D3, 50,000 unit cap Take 1 Cap by mouth every Monday.     ??? methotrexate, PF, 25 mg/mL injection 25 mg every seven (7) days.     ??? nicotinic acid (NIACIN) 500 mg tablet Take 500 mg by mouth Daily (before breakfast). 2 po everyday.      ??? calcium-cholecalciferol, d3, (CALCIUM 600 + D) 600-125 mg-unit tab Take 2 Tabs by mouth.     ??? multivitamin (ONE A DAY) tablet Take 1 Tab by mouth daily.     ??? folic acid (FOLVITE) 1 mg tablet Take  by mouth daily.       Allergies   Allergen Reactions   ??? Yeast Nausea and Vomiting     Past Surgical History:   Procedure Laterality Date   ??? COLONOSCOPY N/A 01/27/2018     COLONOSCOPY/BMI 26 performed by Azalia Bilis, MD at [facility]   ??? HX CARPAL TUNNEL RELEASE Bilateral    ??? HX COLONOSCOPY      colon polyps-adenomatous, has seen Dr. Kristopher Glee in past   ??? 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 HX HEMORRHOIDECTOMY     ??? HX OTHER SURGICAL  09/2012    sinus surgery-Dr. Mickle Mallory   ??? 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 HX TONSILLECTOMY  79 yrs old   ??? PR COLONOSCOPY FLX DX W/COLLJ SPEC WHEN PFRMD  01/27/2018          Family History   Problem Relation Age of Onset   ??? Arthritis-osteo Mother    ??? Alzheimer Father    ??? Breast Cancer Neg Hx      Social History     Tobacco Use   ??? Smoking status: Former Smoker     Packs/day: 0.50     Years: 43.00     Pack years: 21.50     Types: Cigarettes     Start date: 1961     Quit date: 1985     Years since quitting: 36.8   ??? Smokeless tobacco: Never Used   ??? Tobacco comment: not regular when first started- 1pk/week   Substance Use Topics   ??? Alcohol use: No     Alcohol/week: 0.0 standard drinks        Review of Systems   A comprehensive review of systems was negative except for that written in the HPI.     Physical Exam  Visit Vitals  BP 118/74   Pulse (!) 51   Ht 5' 5.5" (1.664 m)   Wt 154 lb (69.9 kg)   SpO2 98%   BMI 25.24 kg/m??        General: Alert, oriented, cooperative, awake patient in no acute distress   Skin:  Warm, moist with good texture   Eyes:   Sclera are clear, extraocular muscles intact  HENT:  Normocephalic; oral mucosa moist, nares patent; neck is supple; trachea midline  Respiratory: Lungs clear to auscultation bilaterally, breathing is non-labored   Chest:  Symmetric throughout one respiratory excursion; no supraclavicular lymphadenopathy  CV:  Regular rate and rhythm,  no appreciable murmurs, rubs, gallops  Abdomen: Soft, protuberant but non-distended; bowel sounds are normoactive   Extremities: No cyanosis, clubbing or edema  Neurological: No focal signs      Labs:   Lab Results    Component Value Date/Time    WBC 9.8 04/05/2020 11:46 AM    HGB 13.3 04/05/2020 11:46 AM    HCT 41.7 04/05/2020 11:46 AM    PLATELET 341 04/05/2020 11:46 AM    MCV 99.3 (H) 04/05/2020 11:46 AM     Lab Results   Component Value Date/Time    Sodium 138 04/05/2020 11:46 AM    Potassium 3.9 04/05/2020 11:46 AM    Chloride 101 04/05/2020 11:46 AM    CO2 28 04/05/2020 11:46 AM    Anion gap 9 04/05/2020 11:46 AM    Glucose 103 (H) 04/05/2020 11:46 AM    BUN 9 04/05/2020 11:46 AM    Creatinine 0.79 04/05/2020 11:46 AM    BUN/Creatinine ratio 20 01/03/2020 02:25 PM    GFR est AA >60 04/05/2020 11:46 AM    GFR est non-AA >60 04/05/2020 11:46 AM    Calcium 9.8 04/05/2020 11:46 AM    Bilirubin, total 0.6 01/03/2020 02:25 PM    Alk. phosphatase 49 01/03/2020 02:25 PM    Protein, total 7.0 01/03/2020 02:25 PM    Albumin 4.4 01/03/2020 02:25 PM    A-G Ratio 1.7 01/03/2020 02:25 PM    ALT (SGPT) 18 01/03/2020 02:25 PM    AST (SGOT) 18 01/03/2020 02:25 PM       CT:  CT Results (most recent):  Results from [facility] encounter on 04/16/20    CT BX LUNG NDL PERC W IMAGING    Narrative  PROCEDURE: CT-guided Core Biopsy.    Procedural Personnel  Attending physician(s): Vladimir Creeks, M.D.    Pre-procedure diagnosis: Pleasant 79 year old woman with newly diagnosed mass in  the posterior right lower lobe of the lung.  History of tobacco use.  Chronic  obstructive pulmonary disease.  Post-procedure diagnosis: Same  Indication: Histopathologic diagnosis  Previous biopsy of same target (QCDR): No    Complications: No immediate complications.    Impression  CT-guided Core biopsy of right lower lobe pulmonary mass.    Plan:  Specimen(s) sent to the Pathology department for evaluation.  4 hours  bedrest.  Chest x-ray in 3 hours.  _______________________________________________________________  Technique:  All CT scans at this facility are performed using dose  reduction/dose modulation techniques, as appropriate to the performed  exam,  including the following:  Automated Exposure Control; Adjustment of the MA  and/or kF according to patient size (this includes techniques or standardized  protocols for targeted exams where dose is matched to indication/reason for  exam); and Use of Iterative Reconstruction Technique.    PROCEDURE SUMMARY:  - Percutaneous CT-guided Coaxial Core Needle Biopsy  - Additional procedure(s): BioSentry needle tract occlusion    PROCEDURE DETAILS:    Pre-procedure  Reference imaging for biopsy target: PET/CT 04/02/2020, CT 02/29/2020 and  11/14/2016 (imported from [facility] after the biopsy was performed).    Consent:  Informed written and oral consent for the procedure was obtained after  explanation of risks (including, but not limitted to:  hemorrhage, infection,  visceral injury, nondiagnostic biopsy) benefits and alternatives.  The patient's  questions were answered to their satisfaction.  They stated understanding and  requested that we proceed.    Final verification:  A time-out identifying the patient and planned procedure  was performed  prior to this procedure.    Preparation: (MIPS) Maximal sterile barrier technique (including:  cap, mask,  sterile gown, sterile gloves, sterile sheet, hand hygiene, and cutaneous  antisepsis) was used.    Anesthesia/sedation  Level of anesthesia/sedation: Moderate sedation (conscious sedation)  Anesthesia/sedation administered by: Independent trained observer under  attending supervision with continuous monitoring of the patient?s level of  consciousness and physiologic status  Total intra-service sedation time (minutes): 18    Imaging prior to biopsy  The patient was positioned prone. Initial imaging was performed using  noncontrast CT.  Biopsy target:  - Maximal diameter (cm): 6  - Location: Posterior, inferior, right lower lobe of the lung.  Other findings: None    Biopsy  Local anesthesia was administered. Under CT guidance, the coaxial biopsy system  was advanced to the  target and Core biopsy was performed.  Care was taken to  biopsy the periphery of this mass, in the location of FDG activity.  Coaxial needle: 19 gauge    Core needle biopsy device: [place]  Core needle size: 20 gauge  Number of core specimens: 4    Fine needle aspiration device: Not applicable  Fine needle size: Not applicable  Number of FNA specimens: Not applicable    On-site biopsy touch preparation: No  Additional sampling recommendations: Not applicable  Preliminary assessment of sample adequacy: Not applicable    Needle removal  The biopsy needle was removed and a sterile dressing was applied.  Tract embolization: BioSentry Tract Sealant System--self-expanding hydrogel plug    Imaging following biopsy  Immediate post-biopsy imaging was not performed.  Imaging modality: Not applicable.  Post-biopsy imaging findings: Not applicable    Contrast  Contrast agent: None  Contrast volume (mL): 0    Radiation Dose  CT dose length product (mGy-cm):  825.03    Additional Details  Additional description of procedure: None  Equipment details: None  Specimens removed: Pathology  Estimated blood loss (mL): Less than 10  Standardized report: [email or web address]    Attestation  Signer name: Vladimir Creeks, M.D.  I attest that I personally performed the entire procedure. I reviewed the stored  images and agree with the report as written.      PET:  PET Results (most recent):  Results from Hospital Encounter encounter on 04/02/20    PET/CT TUMOR IMAGE SKULL THIGH W (INI)    Narrative  EXAM: PET/CT TUMOR IMAGE SKULL THIGH W (INI)    INDICATION: highly suspicious for lung cancer.    PROCEDURE: Following IV injection of 13.91 mCi 18 Fluoro 2 deoxyglucose (FDG)  and a standard uptake delay, PET imaging is performed from head to thighs and  axial, sagittal and coronal images were acquired. Unenhanced CT is obtained for  anatomic localization, and attenuation correction of the PET scan. Patient  preprocedure blood glucose  level: 1:33m/dL.    CORRELATIVE IMAGING STUDIES: CT chest dated 02/29/2020.    COMPARISON PET: No prior PET/CT    HISTORY: The study is requested for initial treatment strategy. .Marland Kitchen   FINDINGS:    HEAD/NECK:  Mildly hypermetabolic nodule within the superficial left parotid gland, favored  to represent a lymph node. No other hypermetabolic cervical lymphadenopathy.  Cerebral evaluation is limited by normal intense activity.    CHEST:  Redemonstrated 6 cm pulmonary mass within the right lower lobe which  demonstrates peripheral nodular hypermetabolic activity (max SUV 8.9).  Additional nodular area inferiorly along the right posterior pleura which  demonstrates increased metabolic activity (  max SUV 8.9). No hypermetabolic hilar  or mediastinal lymphadenopathy. Scattered calcified atherosclerotic disease    ABDOMEN/PELVIS:  No foci of abnormal hypermetabolism. Suspected physiologic uptake within the  gastrointestinal and genitourinary tracts.    SKELETON:  No foci of abnormal hypermetabolism in the axial and visualized appendicular  skeleton.    Impression  1.  The 6 mm right lower lobe pulmonary mass demonstrates peripheral nodular  hypermetabolic activity, concerning for primary lung neoplasm with central  necrosis.  2.  No hypermetabolic lymphadenopathy or distant metastatic disease.  3.  Mildly enlarged and metabolic left parotid lymph node, likely reactive in  etiology.    PATH:    "RIGHT LUNG MASS": ??IMMUNOHISTOCHEMICAL FINDINGS MOST CONSISTENT WITH   ADENOCARCINOMA OF LUNG ORIGIN.     PFT:  04/04/2020 ??1:59 PM - Edi, Transcription    Narrative & Impression    IMPRESSION  1. Spirometry, lung volumes and flow volume loops demonstrate a moderate   obstruction with air trapping.  2. Diffusion capacity is moderately reduced  3. Bronchodilator response testing was not performed  ????  Impression: Moderate obstruction with air trapping and moderately reduced   diffusion capacity consistent with COPD.        Assessment/Plan:   BRIGHTEN BUZZELLI is a 79 y.o. female who has signs and symptoms consistent with right lung cancer, no clear metastatic disease, reasonable lung function. Discussed surgery as first option, right VATS lower lobectomy with mediastinal lymphadenectomy. Possible thoracotomy, especially given her history of thoracoscopy on the right side.     Her PET scan shows additional pleural nodule right next to primary cancer, if this is direct invasion, en bloc surgical resection will be attempted, however, if she has additional pleural disease, surgical resection may be aborted.     She understands risks and benefits and agrees to proceed. Will get cardio clearance.     Total time spent with patient including chart review is 40 minutes    Thayer Jew, MD

## 2020-04-28 DIAGNOSIS — C3491 Malignant neoplasm of unspecified part of right bronchus or lung: Secondary | ICD-10-CM

## 2020-04-30 ENCOUNTER — Encounter: Primary: Internal Medicine

## 2020-05-02 ENCOUNTER — Inpatient Hospital Stay: Admit: 2020-05-02 | Payer: MEDICARE | Attending: Surgery | Primary: Internal Medicine

## 2020-05-02 ENCOUNTER — Inpatient Hospital Stay: Admit: 2020-05-02 | Payer: MEDICARE | Primary: Internal Medicine

## 2020-05-02 DIAGNOSIS — Z01818 Encounter for other preprocedural examination: Secondary | ICD-10-CM

## 2020-05-02 LAB — URINALYSIS W/ RFLX MICROSCOPIC
Bilirubin, Urine: NEGATIVE
Bilirubin: NEGATIVE
Blood, Urine: NEGATIVE
Blood: NEGATIVE
Glucose, Ur: NEGATIVE mg/dL
Glucose: NEGATIVE mg/dL
Ketone: NEGATIVE mg/dL
Ketones, Urine: NEGATIVE mg/dL
Leukocyte Esterase, Urine: NEGATIVE
Leukocyte Esterase: NEGATIVE
Nitrite, Urine: NEGATIVE
Nitrites: NEGATIVE
Protein, UA: NEGATIVE mg/dL
Protein: NEGATIVE mg/dL
Specific Gravity, UA: 1.007 (ref 1.001–1.023)
Specific gravity: 1.007 (ref 1.001–1.023)
Urobilinogen, UA, POCT: 0.2 EU/dL (ref 0.2–1.0)
Urobilinogen: 0.2 EU/dL (ref 0.2–1.0)
pH (UA): 7 (ref 5.0–9.0)
pH, UA: 7 (ref 5.0–9.0)

## 2020-05-02 LAB — CBC WITH AUTO DIFFERENTIAL
Basophils %: 1 % (ref 0.0–2.0)
Basophils Absolute: 0.1 10*3/uL (ref 0.0–0.2)
Eosinophils %: 7 % (ref 0.5–7.8)
Eosinophils Absolute: 0.7 10*3/uL (ref 0.0–0.8)
Granulocyte Absolute Count: 0 10*3/uL (ref 0.0–0.5)
Hematocrit: 36.6 % (ref 35.8–46.3)
Hemoglobin: 11.6 g/dL — ABNORMAL LOW (ref 11.7–15.4)
Immature Granulocytes: 0 % (ref 0.0–5.0)
Lymphocytes %: 20 % (ref 13–44)
Lymphocytes Absolute: 1.9 10*3/uL (ref 0.5–4.6)
MCH: 31.1 PG (ref 26.1–32.9)
MCHC: 31.7 g/dL (ref 31.4–35.0)
MCV: 98.1 FL — ABNORMAL HIGH (ref 79.6–97.8)
MPV: 10.2 FL (ref 9.4–12.3)
Monocytes %: 9 % (ref 4.0–12.0)
Monocytes Absolute: 0.9 10*3/uL (ref 0.1–1.3)
NRBC Absolute: 0 10*3/uL (ref 0.0–0.2)
Neutrophils %: 63 % (ref 43–78)
Neutrophils Absolute: 6 10*3/uL (ref 1.7–8.2)
Platelets: 340 10*3/uL (ref 150–450)
RBC: 3.73 M/uL — ABNORMAL LOW (ref 4.05–5.2)
RDW: 13.9 % (ref 11.9–14.6)
WBC: 9.5 10*3/uL (ref 4.3–11.1)

## 2020-05-02 LAB — EKG 12-LEAD
Atrial Rate: 62 {beats}/min
P Axis: 76 degrees
P-R Interval: 174 ms
Q-T Interval: 426 ms
QRS Duration: 90 ms
QTc Calculation (Bazett): 475 ms
R Axis: 58 degrees
T Axis: 75 degrees
Ventricular Rate: 75 {beats}/min

## 2020-05-02 LAB — PROTIME-INR
INR: 1
Protime: 13.7 s (ref 12.6–14.5)

## 2020-05-02 LAB — COMPREHENSIVE METABOLIC PANEL
ALT: 20 U/L (ref 12–65)
AST: 8 U/L — ABNORMAL LOW (ref 15–37)
Albumin/Globulin Ratio: 0.8 — ABNORMAL LOW (ref 1.2–3.5)
Albumin: 3.2 g/dL (ref 3.2–4.6)
Alkaline Phosphatase: 56 U/L (ref 50–136)
Anion Gap: 5 mmol/L — ABNORMAL LOW (ref 7–16)
BUN: 12 MG/DL (ref 8–23)
CO2: 28 mmol/L (ref 21–32)
Calcium: 8.8 MG/DL (ref 8.3–10.4)
Chloride: 104 mmol/L (ref 98–107)
Creatinine: 0.82 MG/DL (ref 0.6–1.0)
EGFR IF NonAfrican American: >60 mL/min/{1.73_m2} (ref >60)
GFR African American: >60 mL/min/{1.73_m2} (ref >60)
Globulin: 3.9 g/dL — ABNORMAL HIGH (ref 2.3–3.5)
Glucose: 92 mg/dL (ref 65–100)
Potassium: 3.7 mmol/L (ref 3.5–5.1)
Sodium: 137 mmol/L (ref 136–145)
Total Bilirubin: 0.8 MG/DL (ref 0.2–1.1)
Total Protein: 7.1 g/dL (ref 6.3–8.2)

## 2020-05-02 LAB — METABOLIC PANEL, COMPREHENSIVE
A-G Ratio: 0.8 — ABNORMAL LOW (ref 1.2–3.5)
ALT (SGPT): 20 U/L (ref 12–65)
AST (SGOT): 8 U/L — ABNORMAL LOW (ref 15–37)
Albumin: 3.2 g/dL (ref 3.2–4.6)
Alk. phosphatase: 56 U/L (ref 50–136)
Anion gap: 5 mmol/L — ABNORMAL LOW (ref 7–16)
BUN: 12 MG/DL (ref 8–23)
Bilirubin, total: 0.8 MG/DL (ref 0.2–1.1)
CO2: 28 mmol/L (ref 21–32)
Calcium: 8.8 MG/DL (ref 8.3–10.4)
Chloride: 104 mmol/L (ref 98–107)
Creatinine: 0.82 MG/DL (ref 0.6–1.0)
GFR est AA: >60 mL/min/{1.73_m2} (ref >60)
GFR est non-AA: >60 mL/min/{1.73_m2} (ref >60)
Globulin: 3.9 g/dL — ABNORMAL HIGH (ref 2.3–3.5)
Glucose: 92 mg/dL (ref 65–100)
Potassium: 3.7 mmol/L (ref 3.5–5.1)
Protein, total: 7.1 g/dL (ref 6.3–8.2)
Sodium: 137 mmol/L (ref 136–145)

## 2020-05-02 LAB — CBC WITH AUTOMATED DIFF
ABS. BASOPHILS: 0.1 10*3/uL (ref 0.0–0.2)
ABS. EOSINOPHILS: 0.7 10*3/uL (ref 0.0–0.8)
ABS. IMM. GRANS.: 0 10*3/uL (ref 0.0–0.5)
ABS. LYMPHOCYTES: 1.9 10*3/uL (ref 0.5–4.6)
ABS. MONOCYTES: 0.9 10*3/uL (ref 0.1–1.3)
ABS. NEUTROPHILS: 6 10*3/uL (ref 1.7–8.2)
ABSOLUTE NRBC: 0 10*3/uL (ref 0.0–0.2)
BASOPHILS: 1 % (ref 0.0–2.0)
EOSINOPHILS: 7 % (ref 0.5–7.8)
HCT: 36.6 % (ref 35.8–46.3)
HGB: 11.6 g/dL — ABNORMAL LOW (ref 11.7–15.4)
IMMATURE GRANULOCYTES: 0 % (ref 0.0–5.0)
LYMPHOCYTES: 20 % (ref 13–44)
MCH: 31.1 PG (ref 26.1–32.9)
MCHC: 31.7 g/dL (ref 31.4–35.0)
MCV: 98.1 FL — ABNORMAL HIGH (ref 79.6–97.8)
MONOCYTES: 9 % (ref 4.0–12.0)
MPV: 10.2 FL (ref 9.4–12.3)
NEUTROPHILS: 63 % (ref 43–78)
PLATELET: 340 10*3/uL (ref 150–450)
RBC: 3.73 M/uL — ABNORMAL LOW (ref 4.05–5.2)
RDW: 13.9 % (ref 11.9–14.6)
WBC: 9.5 10*3/uL (ref 4.3–11.1)

## 2020-05-02 LAB — EKG, 12 LEAD, INITIAL
Atrial Rate: 62 {beats}/min
Calculated P Axis: 76 degrees
Calculated R Axis: 58 degrees
Calculated T Axis: 75 degrees
P-R Interval: 174 ms
Q-T Interval: 426 ms
QRS Duration: 90 ms
QTC Calculation (Bezet): 475 ms
Ventricular Rate: 75 {beats}/min

## 2020-05-02 LAB — PROTHROMBIN TIME + INR
INR: 1
Prothrombin time: 13.7 s (ref 12.6–14.5)

## 2020-05-02 NOTE — Interval H&P Note (Signed)
Called and spoke with patient who confirmed that she is taking Metoprolol. Instructed patient to take Metoprolol the morning of surgery. Patient verbalized understanding of tinstructions

## 2020-05-02 NOTE — Interval H&P Note (Signed)
Patient verified name and DOB    Order for consent  found in EHR and matches case posting; patient verified.     Type 3 surgery, walk in assessment complete.    Labs per surgeon: CBC with diff, CMP, PT/INR, UA with RFLX; results pending  Labs per anesthesia protocol: CBC, BMP included in above. T&S s/h for DOS.    EKG: 05/02/20  Echo:  05/23/19  CE  NM stress test 10/26/19 CE    05/01/20 CE cardiology OV note indicates acceptable cardiac risk for planned surgery.    Dr. Haskell Riling notified of case.      Patient and husband instructed to drive to entrance A, Amy RN will meet them and take them to see anesthesiologist.  Have CXR completed prior to leaving the  Hospital. Patient seen by Dr. Alycia Rossetti    Patient COVID test date 05/03/20; Patient's husband confirmed the appointment. The testing center is located at the [facility] [address] [place]. If appointment is needed patient provided telephone number of [phone].       Hospital approved surgical skin cleanser and instructions given per hospital policy.    Patient provided with and instructed on educational handouts including Guide to Surgery, Pain Management, Hand Hygiene, Blood Transfusion Education, and Chief Financial Officer.      Patient answered medical/surgical history questions at their best of ability. Patient is extremely hard of hearing, does not wear hearing aids. Patient had to read the questions to answer.   prior to admission medications documented in [facility], patient is not sure if she takes metoprolol and her husband did not know.  Patient instructed to check her medications when she gets home and to call [phone].  Medications discussed during patient appointment.  Chart flagged for charge RN    Patient instructed to hold all vitamins 7 days prior to surgery and NSAIDS 5 days prior to surgery, patient verbalized understanding.     Patient verbalized understanding after reviewing instructions several times.

## 2020-05-02 NOTE — Interval H&P Note (Signed)
 PA AND LATERAL CHEST X-RAY.    Clinical Indication: Preoperative evaluation for lung mass    Comparison: Chest x-ray dated 04/16/2020    Findings: 2 views of the chest submitted demonstrate a large posterior right  lower lobe lung mass that measures 7.8 x 6.2 cm. No additional masses  appreciated. The lungs are clear. There is no pleural effusion. The cardiac  silhouette and mediastinum are unremarkable.    IMPRESSION  Large 7.8 cm right posterior lower lobe lung mass. No acute  abnormality evident.    Routed to surgeon

## 2020-05-02 NOTE — Anesthesia Pre-Procedure Evaluation (Signed)
Anesthetic History   No history of anesthetic complications            Review of Systems / Medical History  Patient summary reviewed and pertinent labs reviewed    Pulmonary    COPD (PFTs: Moderate obstruction with air trapping and moderately reduced diffusion capacity c/w COPD)            Comments: Right Lower Lobe Lung CA   Neuro/Psych   Within defined limits           Cardiovascular    Hypertension        Dysrhythmias : PVC      Exercise tolerance: >4 METS  Comments: Stress Test 10/2019 in [facility] showed normal LVEF and low risk scan.    EKG showing SR with PVCs   GI/Hepatic/Renal     GERD: well controlled           Endo/Other      Hypothyroidism: well controlled  Arthritis (OA and RA) and cancer (Bronchogenic Carcinoma of Right Lung)     Other Findings   Comments: Patient is very [facility]         Physical Exam    Airway  Mallampati: II  TM Distance: 4 - 6 cm  Neck ROM: normal range of motion   Mouth opening: Normal     Cardiovascular  Regular rate and rhythm,  S1 and S2 normal,  no murmur, click, rub, or gallop          Pertinent negatives: No murmur   Dental  No notable dental hx       Pulmonary  Breath sounds clear to auscultation               Abdominal  GI exam deferred       Other Findings            Anesthetic Plan    ASA: 3  Anesthesia type: general    Monitoring Plan: Arterial line      Induction: Intravenous  Anesthetic plan and risks discussed with: Patient and Spouse      Cardiac clearance (in Care Everywhere) was obtained from [facility].

## 2020-05-02 NOTE — Interval H&P Note (Signed)
Recent Results (from the past 12 hour(s))   EKG, 12 LEAD, INITIAL    Collection Time: 05/02/20 12:08 PM   Result Value Ref Range    Ventricular Rate 75 BPM    Atrial Rate 62 BPM    P-R Interval 174 ms    QRS Duration 90 ms    Q-T Interval 426 ms    QTC Calculation (Bezet) 475 ms    Calculated P Axis 76 degrees    Calculated R Axis 58 degrees    Calculated T Axis 75 degrees    Diagnosis       Sinus rhythm with frequent Premature ventricular complexes  Otherwise normal ECG  When compared with ECG of 24-Aug-2012 08:12,  Premature ventricular complexes are now Present  Vent. rate has increased BY 65460 BPM     CBC WITH AUTOMATED DIFF    Collection Time: 05/02/20 12:11 PM   Result Value Ref Range    WBC 9.5 4.3 - 11.1 K/uL    RBC 3.73 (L) 4.05 - 5.2 M/uL    HGB 11.6 (L) 11.7 - 15.4 g/dL    HCT 36.6 35.8 - 46.3 %    MCV 98.1 (H) 79.6 - 97.8 FL    MCH 31.1 26.1 - 32.9 PG    MCHC 31.7 31.4 - 35.0 g/dL    RDW 13.9 11.9 - 14.6 %    PLATELET 340 150 - 450 K/uL    MPV 10.2 9.4 - 12.3 FL    ABSOLUTE NRBC 0.00 0.0 - 0.2 K/uL    DF AUTOMATED      NEUTROPHILS 63 43 - 78 %    LYMPHOCYTES 20 13 - 44 %    MONOCYTES 9 4.0 - 12.0 %    EOSINOPHILS 7 0.5 - 7.8 %    BASOPHILS 1 0.0 - 2.0 %    IMMATURE GRANULOCYTES 0 0.0 - 5.0 %    ABS. NEUTROPHILS 6.0 1.7 - 8.2 K/UL    ABS. LYMPHOCYTES 1.9 0.5 - 4.6 K/UL    ABS. MONOCYTES 0.9 0.1 - 1.3 K/UL    ABS. EOSINOPHILS 0.7 0.0 - 0.8 K/UL    ABS. BASOPHILS 0.1 0.0 - 0.2 K/UL    ABS. IMM. GRANS. 0.0 0.0 - 0.5 K/UL   METABOLIC PANEL, COMPREHENSIVE    Collection Time: 05/02/20 12:11 PM   Result Value Ref Range    Sodium 137 136 - 145 mmol/L    Potassium 3.7 3.5 - 5.1 mmol/L    Chloride 104 98 - 107 mmol/L    CO2 28 21 - 32 mmol/L    Anion gap 5 (L) 7 - 16 mmol/L    Glucose 92 65 - 100 mg/dL    BUN 12 8 - 23 MG/DL    Creatinine 0.82 0.6 - 1.0 MG/DL    GFR est AA >60 >60 ml/min/1.43m    GFR est non-AA >60 >60 ml/min/1.769m   Calcium 8.8 8.3 - 10.4 MG/DL    Bilirubin, total 0.8 0.2 - 1.1 MG/DL    ALT  (SGPT) 20 12 - 65 U/L    AST (SGOT) 8 (L) 15 - 37 U/L    Alk. phosphatase 56 50 - 136 U/L    Protein, total 7.1 6.3 - 8.2 g/dL    Albumin 3.2 3.2 - 4.6 g/dL    Globulin 3.9 (H) 2.3 - 3.5 g/dL    A-G Ratio 0.8 (L) 1.2 - 3.5     URINALYSIS W/ RFLX MICROSCOPIC    Collection Time:  05/02/20 12:11 PM   Result Value Ref Range    Color YELLOW      Appearance CLEAR      Specific gravity 1.007 1.001 - 1.023      pH (UA) 7.0 5.0 - 9.0      Protein Negative NEG mg/dL    Glucose Negative mg/dL    Ketone Negative NEG mg/dL    Bilirubin Negative NEG      Blood Negative NEG      Urobilinogen 0.2 0.2 - 1.0 EU/dL    Nitrites Negative NEG      Leukocyte Esterase Negative NEG     PROTHROMBIN TIME + INR    Collection Time: 05/02/20 12:11 PM   Result Value Ref Range    Prothrombin time 13.7 12.6 - 14.5 sec    INR 1.0       Reviewed.  Within defined limits of anesthesia protocol.    Dr. Sheria Lang reviewed labs 05/02/20 at 1459.

## 2020-05-02 NOTE — Interval H&P Note (Signed)
PLEASE CONTINUE TAKING ALL PRESCRIPTION MEDICATIONS UP TO THE DAY OF SURGERY UNLESS OTHERWISE DIRECTED BELOW.    DISCONTINUE all vitamins and supplements now for surgery. DISCONTINUE Non-Steriodal Anti-Inflammatory (NSAIDS) such as Advil and Aleve 5 days prior to surgery.     Home Medications to take  the day of surgery    gabapentin if needed   Atorvastatin    Use albuterol inhaler if needed    Use Anora inhaler     Use flonase if needed    levothyroxine       Home Medications   to Hold   Vitamins, Supplements, and Herbals. Non-Steriodal Anti-Inflammatory (NSAIDS) such as Advil and Aleve.    Hold the morning of surgery:  losartan        Comments    Covid test 05/03/20 at 11:20 @ [address] [facility], [place]    On the day before surgery please take Acetaminophen 1000mg  in the morning and then again before bed. You may substitute for Tylenol 650 mg.    Bring albuterol inhaler to the hospital    Call [phone] after checking if you take metoprolol       Please do not bring home medications with you on the day of surgery unless otherwise directed by your nurse.  If you are instructed to bring home medications, please give them to your nurse as they will be administered by the nursing staff.    If you have any questions, please call [place] [phone] or [place] [phone].    A copy of this note was provided to the patient for reference.

## 2020-05-03 ENCOUNTER — Institutional Professional Consult (permissible substitution): Payer: MEDICARE | Primary: Internal Medicine

## 2020-05-03 DIAGNOSIS — Z20822 Contact with and (suspected) exposure to covid-19: Secondary | ICD-10-CM

## 2020-05-03 NOTE — Progress Notes (Signed)
Patient presented to the [facility] for COVID testing as ordered.  After verbal consent given by patient/caregiver,  nasal swab obtained and sent to lab for processing.

## 2020-05-05 LAB — COVID-19: SARS-CoV-2, NAA: NOT DETECTED

## 2020-05-05 LAB — NOVEL CORONAVIRUS (COVID-19): SARS-CoV-2, NAA: NOT DETECTED

## 2020-05-05 LAB — INPATIENT

## 2020-05-05 LAB — SARS-COV-2, NAA 2 DAY TAT

## 2020-05-07 ENCOUNTER — Inpatient Hospital Stay: Admit: 2020-05-07 | Payer: MEDICARE | Primary: Internal Medicine

## 2020-05-07 ENCOUNTER — Inpatient Hospital Stay
Admit: 2020-05-07 | Discharge: 2020-05-10 | Disposition: A | Discharge status: Home / Self Care | Payer: MEDICARE | Attending: Surgery | Admitting: Surgery

## 2020-05-07 DIAGNOSIS — C3431 Malignant neoplasm of lower lobe, right bronchus or lung: Secondary | ICD-10-CM

## 2020-05-07 LAB — BLOOD GAS & LYTES, ARTERIAL
BASE DEFICIT (POC): 2 mmol/L
Base deficit (POC): 2 mmol/L
CO2, POC: 24 MMOL/L — ABNORMAL HIGH (ref 13–23)
Calcium, ionized (POC): 1.11 mmol/L — ABNORMAL LOW (ref 1.12–1.32)
Calcium, ionized, POC: 1.11 mmol/L — ABNORMAL LOW (ref 1.12–1.32)
FIO2, L/MIN - FIO2P: 3.5
FIO2, L/min: 3.5
Glucose, POC: 176 MG/DL — ABNORMAL HIGH (ref 65–100)
Glucose, POC: 176 MG/DL — ABNORMAL HIGH (ref 65–100)
HCO3 (POC): 23.1 MMOL/L (ref 22–26)
HCO3, Art: 23.1 MMOL/L (ref 22–26)
O2 SAT: 90 %
O2 Sat: 90 %
Potassium, POC: 4 MMOL/L (ref 3.5–5.1)
Potassium, POC: 4 MMOL/L (ref 3.5–5.1)
Sodium, POC: 137 MMOL/L (ref 136–145)
Sodium, POC: 137 MMOL/L (ref 136–145)
TCO2, POC: 24 MMOL/L — ABNORMAL HIGH (ref 13–23)
pCO2 (POC): 39.9 MMHG (ref 35–45)
pCO2, Art: 39.9 MMHG (ref 35–45)
pH (POC): 7.37 (ref 7.35–7.45)
pH, Art: 7.37 (ref 7.35–7.45)
pO2 (POC): 61 MMHG — ABNORMAL LOW (ref 75–100)
pO2, Art: 61 MMHG — ABNORMAL LOW (ref 75–100)

## 2020-05-07 LAB — TYPE AND SCREEN
ABO/Rh: AB POS
Antibody Screen: NEGATIVE

## 2020-05-07 LAB — TYPE & SCREEN
ABO/Rh(D): AB POS
Antibody screen: NEGATIVE

## 2020-05-07 MED ORDER — SODIUM CHLORIDE 0.9 % IJ SYRG
INTRAMUSCULAR | Status: DC | PRN
Start: 2020-05-07 — End: 2020-05-07

## 2020-05-07 MED ORDER — FENTANYL CITRATE (PF) 50 MCG/ML IJ SOLN
50 mcg/mL | INTRAMUSCULAR | Status: DC | PRN
Start: 2020-05-07 — End: 2020-05-07
  Administered 2020-05-07 (×5): via INTRAVENOUS

## 2020-05-07 MED ORDER — SODIUM CHLORIDE 0.9 % IJ SYRG
Freq: Three times a day (TID) | INTRAMUSCULAR | Status: DC
Start: 2020-05-07 — End: 2020-05-07
  Administered 2020-05-07: 18:00:00 via INTRAVENOUS

## 2020-05-07 MED ORDER — LIDOCAINE (PF) 20 MG/ML (2 %) IJ SOLN
20 mg/mL (2 %) | INTRAMUSCULAR | Status: AC
Start: 2020-05-07 — End: ?

## 2020-05-07 MED ORDER — KETAMINE 50 MG/ML IJ SOLN
50 mg/mL | INTRAMUSCULAR | Status: AC
Start: 2020-05-07 — End: ?

## 2020-05-07 MED ORDER — LACTATED RINGERS IV
INTRAVENOUS | Status: DC
Start: 2020-05-07 — End: 2020-05-08
  Administered 2020-05-07 – 2020-05-08 (×2): via INTRAVENOUS

## 2020-05-07 MED ORDER — SODIUM CHLORIDE 0.9 % IJ SYRG
INTRAMUSCULAR | Status: DC | PRN
Start: 2020-05-07 — End: 2020-05-10

## 2020-05-07 MED ORDER — ATORVASTATIN 20 MG TAB
20 mg | Freq: Every day | ORAL | Status: DC
Start: 2020-05-07 — End: 2020-05-10
  Administered 2020-05-08 – 2020-05-10 (×3): via ORAL

## 2020-05-07 MED ORDER — GUAIFENESIN 600 MG TABLET,EXTENDED RELEASE BIPHASIC 12 HR
600 mg | Freq: Two times a day (BID) | ORAL | Status: DC
Start: 2020-05-07 — End: 2020-05-10
  Administered 2020-05-08 – 2020-05-10 (×6): via ORAL

## 2020-05-07 MED ORDER — HYDROMORPHONE 2 MG/ML INJECTION SOLUTION
2 mg/mL | INTRAMUSCULAR | Status: AC | PRN
Start: 2020-05-07 — End: 2020-05-07
  Administered 2020-05-07 (×4): via INTRAVENOUS

## 2020-05-07 MED ORDER — FLUTICASONE 50 MCG/ACTUATION NASAL SPRAY, SUSP
50 mcg/actuation | Freq: Every day | NASAL | Status: DC
Start: 2020-05-07 — End: 2020-05-10
  Administered 2020-05-10: 12:00:00 via NASAL

## 2020-05-07 MED ORDER — LEVOTHYROXINE 100 MCG TAB
100 mcg | ORAL | Status: AC
Start: 2020-05-07 — End: 2020-05-07
  Administered 2020-05-07: 12:00:00 via ORAL

## 2020-05-07 MED ORDER — ONDANSETRON (PF) 4 MG/2 ML INJECTION
4 mg/2 mL | Freq: Four times a day (QID) | INTRAMUSCULAR | Status: DC | PRN
Start: 2020-05-07 — End: 2020-05-10
  Administered 2020-05-07: 22:00:00 via INTRAVENOUS

## 2020-05-07 MED ORDER — KETAMINE 50 MG/ML IJ SOLN
50 mg/mL | INTRAMUSCULAR | Status: DC | PRN
Start: 2020-05-07 — End: 2020-05-07
  Administered 2020-05-07 (×3): via INTRAVENOUS

## 2020-05-07 MED ORDER — METOPROLOL SUCCINATE SR 25 MG 24 HR TAB
25 mg | ORAL | Status: AC
Start: 2020-05-07 — End: 2020-05-07
  Administered 2020-05-07: 12:00:00 via ORAL

## 2020-05-07 MED ORDER — SENNOSIDES-DOCUSATE SODIUM 8.6 MG-50 MG TAB
Freq: Two times a day (BID) | ORAL | Status: DC
Start: 2020-05-07 — End: 2020-05-10
  Administered 2020-05-07 – 2020-05-10 (×6): via ORAL

## 2020-05-07 MED ORDER — SODIUM CHLORIDE 0.9 % IV
INTRAVENOUS | Status: DC | PRN
Start: 2020-05-07 — End: 2020-05-07
  Administered 2020-05-07 (×2): via INTRAVENOUS

## 2020-05-07 MED ORDER — FENTANYL CITRATE (PF) 50 MCG/ML IJ SOLN
50 mcg/mL | Freq: Once | INTRAMUSCULAR | Status: DC
Start: 2020-05-07 — End: 2020-05-07

## 2020-05-07 MED ORDER — GABAPENTIN 300 MG CAP
300 mg | Freq: Three times a day (TID) | ORAL | Status: DC
Start: 2020-05-07 — End: 2020-05-10
  Administered 2020-05-07 – 2020-05-10 (×9): via ORAL

## 2020-05-07 MED ORDER — PROPOFOL 10 MG/ML IV EMUL
10 mg/mL | INTRAVENOUS | Status: DC | PRN
Start: 2020-05-07 — End: 2020-05-07
  Administered 2020-05-07 (×2): via INTRAVENOUS

## 2020-05-07 MED ORDER — PANTOPRAZOLE 40 MG TAB, DELAYED RELEASE
40 mg | Freq: Every day | ORAL | Status: DC
Start: 2020-05-07 — End: 2020-05-10
  Administered 2020-05-08 – 2020-05-10 (×3): via ORAL

## 2020-05-07 MED ORDER — ONDANSETRON (PF) 4 MG/2 ML INJECTION
4 mg/2 mL | INTRAMUSCULAR | Status: DC | PRN
Start: 2020-05-07 — End: 2020-05-07
  Administered 2020-05-07: 15:00:00 via INTRAVENOUS

## 2020-05-07 MED ORDER — NALOXONE 0.4 MG/ML INJECTION
0.4 mg/mL | INTRAMUSCULAR | Status: DC | PRN
Start: 2020-05-07 — End: 2020-05-07

## 2020-05-07 MED ORDER — OXYCODONE 5 MG TAB
5 mg | ORAL | Status: DC | PRN
Start: 2020-05-07 — End: 2020-05-10
  Administered 2020-05-08 – 2020-05-10 (×3): via ORAL

## 2020-05-07 MED ORDER — PHENYLEPHRINE 10 MG/ML INJECTION
10 mg/mL | INTRAMUSCULAR | Status: DC | PRN
Start: 2020-05-07 — End: 2020-05-07
  Administered 2020-05-07 (×12): via INTRAVENOUS

## 2020-05-07 MED ORDER — CEFAZOLIN 1 GRAM SOLUTION FOR INJECTION
1 gram | Freq: Three times a day (TID) | INTRAMUSCULAR | Status: AC
Start: 2020-05-07 — End: 2020-05-08
  Administered 2020-05-07 – 2020-05-08 (×2): via INTRAVENOUS

## 2020-05-07 MED ORDER — ROCURONIUM 10 MG/ML IV
10 mg/mL | INTRAVENOUS | Status: AC
Start: 2020-05-07 — End: ?

## 2020-05-07 MED ORDER — LIDOCAINE (PF) 20 MG/ML (2 %) IJ SOLN
20 mg/mL (2 %) | INTRAMUSCULAR | Status: DC | PRN
Start: 2020-05-07 — End: 2020-05-07
  Administered 2020-05-07: 14:00:00 via INTRAVENOUS

## 2020-05-07 MED ORDER — ENOXAPARIN 40 MG/0.4 ML SUB-Q SYRINGE
40 mg/0.4 mL | Freq: Every day | SUBCUTANEOUS | Status: DC
Start: 2020-05-07 — End: 2020-05-10
  Administered 2020-05-08 – 2020-05-10 (×3): via SUBCUTANEOUS

## 2020-05-07 MED ORDER — LIDOCAINE HCL 1 % (10 MG/ML) IJ SOLN
10 mg/mL (1 %) | INTRAMUSCULAR | Status: DC | PRN
Start: 2020-05-07 — End: 2020-05-07

## 2020-05-07 MED ORDER — PROPOFOL 10 MG/ML IV EMUL
10 mg/mL | INTRAVENOUS | Status: AC
Start: 2020-05-07 — End: ?

## 2020-05-07 MED ORDER — METOPROLOL SUCCINATE SR 50 MG 24 HR TAB
50 mg | Freq: Every day | ORAL | Status: DC
Start: 2020-05-07 — End: 2020-05-10
  Administered 2020-05-09 – 2020-05-10 (×2): via ORAL

## 2020-05-07 MED ORDER — LACTATED RINGERS IV
INTRAVENOUS | Status: DC
Start: 2020-05-07 — End: 2020-05-07
  Administered 2020-05-07 (×3): via INTRAVENOUS

## 2020-05-07 MED ORDER — IPRATROPIUM-ALBUTEROL 2.5 MG-0.5 MG/3 ML NEB SOLUTION
2.5 mg-0.5 mg/3 ml | Freq: Four times a day (QID) | RESPIRATORY_TRACT | Status: DC
Start: 2020-05-07 — End: 2020-05-10
  Administered 2020-05-08 – 2020-05-10 (×11): via RESPIRATORY_TRACT

## 2020-05-07 MED ORDER — ROCURONIUM 10 MG/ML IV
10 mg/mL | INTRAVENOUS | Status: DC | PRN
Start: 2020-05-07 — End: 2020-05-07
  Administered 2020-05-07 (×6): via INTRAVENOUS

## 2020-05-07 MED ORDER — OXYCODONE 5 MG TAB
5 mg | Freq: Once | ORAL | Status: AC | PRN
Start: 2020-05-07 — End: 2020-05-07
  Administered 2020-05-07: 19:00:00 via ORAL

## 2020-05-07 MED ORDER — CEFAZOLIN 2 GRAM/20 ML IN STERILE WATER INTRAVENOUS SYRINGE
2 gram/0 mL | Freq: Once | INTRAVENOUS | Status: AC
Start: 2020-05-07 — End: 2020-05-07
  Administered 2020-05-07: 14:00:00 via INTRAVENOUS

## 2020-05-07 MED ORDER — LEVOTHYROXINE 100 MCG TAB
100 mcg | Freq: Every day | ORAL | Status: DC
Start: 2020-05-07 — End: 2020-05-10
  Administered 2020-05-08 – 2020-05-10 (×3): via ORAL

## 2020-05-07 MED ORDER — DEXAMETHASONE SODIUM PHOSPHATE 4 MG/ML IJ SOLN
4 mg/mL | INTRAMUSCULAR | Status: DC | PRN
Start: 2020-05-07 — End: 2020-05-07
  Administered 2020-05-07: 15:00:00 via INTRAVENOUS

## 2020-05-07 MED ORDER — TIOTROPIUM 2.5 MCG-OLODATEROL 2.5 MCG/ACTUATION MIST FOR INHALATION
Freq: Every day | RESPIRATORY_TRACT | Status: DC
Start: 2020-05-07 — End: 2020-05-10
  Administered 2020-05-09 – 2020-05-10 (×3): via RESPIRATORY_TRACT

## 2020-05-07 MED ORDER — SUGAMMADEX 100 MG/ML INTRAVENOUS SOLUTION
100 mg/mL | INTRAVENOUS | Status: DC | PRN
Start: 2020-05-07 — End: 2020-05-07
  Administered 2020-05-07: 18:00:00 via INTRAVENOUS

## 2020-05-07 MED ORDER — ONDANSETRON (PF) 4 MG/2 ML INJECTION
4 mg/2 mL | INTRAMUSCULAR | Status: AC
Start: 2020-05-07 — End: ?

## 2020-05-07 MED ORDER — FENTANYL CITRATE (PF) 50 MCG/ML IJ SOLN
50 mcg/mL | INTRAMUSCULAR | Status: AC
Start: 2020-05-07 — End: ?

## 2020-05-07 MED ORDER — MIDAZOLAM 1 MG/ML IJ SOLN
1 mg/mL | Freq: Once | INTRAMUSCULAR | Status: DC | PRN
Start: 2020-05-07 — End: 2020-05-07

## 2020-05-07 MED ORDER — EPHEDRINE (PF) 50 MG/5 ML (10 MG/ML) IN NS IV SYRINGE
50 mg/5 mL (10 mg/mL) | INTRAVENOUS | Status: DC | PRN
Start: 2020-05-07 — End: 2020-05-07
  Administered 2020-05-07 (×3): via INTRAVENOUS

## 2020-05-07 MED ORDER — EPHEDRINE (PF) 50 MG/5 ML (10 MG/ML) IN NS IV SYRINGE
50 mg/5 mL (10 mg/mL) | INTRAVENOUS | Status: AC
Start: 2020-05-07 — End: ?

## 2020-05-07 MED ORDER — SODIUM CHLORIDE 0.9 % IV
INTRAVENOUS | Status: DC
Start: 2020-05-07 — End: 2020-05-07
  Administered 2020-05-07: 12:00:00 via INTRAVENOUS

## 2020-05-07 MED ORDER — OXYCODONE 5 MG TAB
5 mg | ORAL | Status: DC | PRN
Start: 2020-05-07 — End: 2020-05-10
  Administered 2020-05-08 – 2020-05-09 (×3): via ORAL

## 2020-05-07 MED ORDER — FAMOTIDINE 20 MG TAB
20 mg | Freq: Once | ORAL | Status: AC
Start: 2020-05-07 — End: 2020-05-07
  Administered 2020-05-07: 12:00:00 via ORAL

## 2020-05-07 MED ORDER — DEXAMETHASONE SODIUM PHOSPHATE 10 MG/ML IJ SOLN
10 mg/mL | INTRAMUSCULAR | Status: AC
Start: 2020-05-07 — End: ?

## 2020-05-07 MED ORDER — HYDROCODONE-ACETAMINOPHEN 7.5 MG-325 MG TAB
ORAL | Status: DC | PRN
Start: 2020-05-07 — End: 2020-05-07

## 2020-05-07 MED ORDER — HYDROMORPHONE 1 MG/ML INJECTION SOLUTION
1 mg/mL | INTRAMUSCULAR | Status: DC | PRN
Start: 2020-05-07 — End: 2020-05-10
  Administered 2020-05-07 – 2020-05-10 (×3): via INTRAVENOUS

## 2020-05-07 MED ORDER — SODIUM CHLORIDE 0.9 % IJ SYRG
Freq: Three times a day (TID) | INTRAMUSCULAR | Status: DC
Start: 2020-05-07 — End: 2020-05-10
  Administered 2020-05-07 – 2020-05-10 (×9): via INTRAVENOUS

## 2020-05-07 MED ORDER — HYDROMORPHONE 2 MG/ML INJECTION SOLUTION
2 mg/mL | INTRAMUSCULAR | Status: DC | PRN
Start: 2020-05-07 — End: 2020-05-07
  Administered 2020-05-07: 19:00:00 via INTRAVENOUS

## 2020-05-07 MED ORDER — PROMETHAZINE 12.5 MG TAB
12.5 mg | Freq: Four times a day (QID) | ORAL | Status: DC | PRN
Start: 2020-05-07 — End: 2020-05-10

## 2020-05-07 MED FILL — ONDANSETRON (PF) 4 MG/2 ML INJECTION: 4 mg/2 mL | INTRAMUSCULAR | Qty: 2

## 2020-05-07 MED FILL — GABAPENTIN 300 MG CAP: 300 mg | ORAL | Qty: 1

## 2020-05-07 MED FILL — PROPOFOL 10 MG/ML IV EMUL: 10 mg/mL | INTRAVENOUS | Qty: 20

## 2020-05-07 MED FILL — DEXAMETHASONE SODIUM PHOSPHATE 10 MG/ML IJ SOLN: 10 mg/mL | INTRAMUSCULAR | Qty: 1

## 2020-05-07 MED FILL — HYDROMORPHONE (PF) 1 MG/ML IJ SOLN: 1 mg/mL | INTRAMUSCULAR | Qty: 1

## 2020-05-07 MED FILL — FENTANYL CITRATE (PF) 50 MCG/ML IJ SOLN: 50 mcg/mL | INTRAMUSCULAR | Qty: 2

## 2020-05-07 MED FILL — METOPROLOL SUCCINATE SR 25 MG 24 HR TAB: 25 mg | ORAL | Qty: 2

## 2020-05-07 MED FILL — EPHEDRINE 50 MG/5 ML (10 MG/ML) IN NS IV SYRINGE: 50 mg/5 mL (10 mg/mL) | INTRAVENOUS | Qty: 5

## 2020-05-07 MED FILL — FAMOTIDINE 20 MG TAB: 20 mg | ORAL | Qty: 1

## 2020-05-07 MED FILL — KETAMINE 50 MG/ML IJ SOLN: 50 mg/mL | INTRAMUSCULAR | Qty: 10

## 2020-05-07 MED FILL — XYLOCAINE-MPF 20 MG/ML (2 %) INJECTION SOLUTION: 20 mg/mL (2 %) | INTRAMUSCULAR | Qty: 5

## 2020-05-07 MED FILL — HYDROMORPHONE 2 MG/ML INJECTION SOLUTION: 2 mg/mL | INTRAMUSCULAR | Qty: 1

## 2020-05-07 MED FILL — OXYCODONE 5 MG TAB: 5 mg | ORAL | Qty: 1

## 2020-05-07 MED FILL — ROCURONIUM 10 MG/ML IV: 10 mg/mL | INTRAVENOUS | Qty: 5

## 2020-05-07 MED FILL — LACTATED RINGERS IV: INTRAVENOUS | Qty: 1000

## 2020-05-07 MED FILL — LEVOTHYROXINE 100 MCG TAB: 100 mcg | ORAL | Qty: 1

## 2020-05-07 MED FILL — IPRATROPIUM-ALBUTEROL 2.5 MG-0.5 MG/3 ML NEB SOLUTION: 2.5 mg-0.5 mg/3 ml | RESPIRATORY_TRACT | Qty: 3

## 2020-05-07 MED FILL — CEFAZOLIN 1 GRAM SOLUTION FOR INJECTION: 1 gram | INTRAMUSCULAR | Qty: 1000

## 2020-05-07 MED FILL — STOOL SOFTENER-STIMULANT LAXATIVE 8.6 MG-50 MG TABLET: ORAL | Qty: 2

## 2020-05-07 MED FILL — CEFAZOLIN 2 GRAM/20 ML IN STERILE WATER INTRAVENOUS SYRINGE: 2 gram/0 mL | INTRAVENOUS | Qty: 20

## 2020-05-07 NOTE — Progress Notes (Signed)
Problem: Falls - Risk of  Goal: *Absence of Falls  Description: Document Patrcia Dolly Fall Risk and appropriate interventions in the flowsheet.  Outcome: Progressing Towards Goal  Note: Fall Risk Interventions:  Mobility Interventions: Communicate number of staff needed for ambulation/transfer         Medication Interventions: Teach patient to arise slowly    Elimination Interventions: Call light in reach              Problem: Patient Education: Go to Patient Education Activity  Goal: Patient/Family Education  Outcome: Progressing Towards Goal     Problem: Pressure Injury - Risk of  Goal: *Prevention of pressure injury  Description: Document Braden Scale and appropriate interventions in the flowsheet.  Outcome: Progressing Towards Goal  Note: Pressure Injury Interventions:       Moisture Interventions: Contain wound drainage    Activity Interventions: Increase time out of bed    Mobility Interventions: HOB 30 degrees or less, Pressure redistribution bed/mattress (bed type)    Nutrition Interventions: Document food/fluid/supplement intake                     Problem: Patient Education: Go to Patient Education Activity  Goal: Patient/Family Education  Outcome: Progressing Towards Goal     Problem: Falls - Risk of  Goal: *Absence of Falls  Description: Document Schmid Fall Risk and appropriate interventions in the flowsheet.  Outcome: Progressing Towards Goal  Note: Fall Risk Interventions:  Mobility Interventions: Communicate number of staff needed for ambulation/transfer         Medication Interventions: Teach patient to arise slowly    Elimination Interventions: Call light in reach              Problem: Pain  Goal: *Control of Pain  Outcome: Progressing Towards Goal     Problem: Infection - Risk of, Urinary Catheter-Associated Urinary Tract Infection  Goal: *Absence of infection signs and symptoms  Outcome: Progressing Towards Goal

## 2020-05-07 NOTE — Interval H&P Note (Signed)
Floor nurse starting an IV. Await call back to give report.

## 2020-05-07 NOTE — Interval H&P Note (Signed)
Family updated at 12:56 PM by Gates Rigg, RN.  4 digit code obtained.

## 2020-05-07 NOTE — Anesthesia Procedure Notes (Signed)
Arterial Line Placement    Start time: 05/07/2020 10:51 AM  End time: 05/07/2020 10:53 AM  Performed by: Tobie Lords, CRNA  Authorized by: Sherald Hess, MD     Pre-Procedure  Indications:  Arterial pressure monitoring  Preanesthetic Checklist: patient identified, risks and benefits discussed, anesthesia consent, site marked, patient being monitored, timeout performed and patient being monitored    Timeout Time: 10:51 EDT        Procedure:   Prep:  ChloraPrep  Orientation:  Right  Location:  Radial artery  Catheter size:  20 G  Number of attempts:  1    Assessment:   Post-procedure:  Line secured and sterile dressing applied  Patient Tolerance:  Patient tolerated the procedure well with no immediate complications

## 2020-05-07 NOTE — Op Note (Signed)
[facility]  OPERATIVE REPORT    Name:  Audrey Snow, Audrey Snow  MR#:  [id number]  DOB:  1940/10/01  ACCOUNT #:  [id number]  DATE OF SERVICE:  05/07/2020    PREOPERATIVE DIAGNOSIS:  Right lung cancer with pleural mass.    POSTOPERATIVE DIAGNOSIS:  Right lung cancer with pleural mass.    PROCEDURES PERFORMED:  1.  Right thoracoscopy with lower lobe lobectomy with lysis of adhesions.  2.  Chest wall mass resection.  3.  Mediastinal lymphadenectomy.  4.  Cryoablation of intercostal nerves.    SURGEON:  Thayer Jew, MD    ASSISTANT:  Andria Frames, NP    ANESTHESIA: general    COMPLICATIONS: none    SPECIMENS REMOVED: as above    IMPLANTS:  CT x 2    ESTIMATED BLOOD LOSS:  100 mL.    INDICATION:  This is an 79 year old female who presented with a large mass in her right chest.  Biopsy confirmed adenocarcinoma.  She also has questionable pleural mass right next to the mass and she has a reasonable lung function and reasonable health to tolerate surgeries.  This is offered to her.  She understood the risks and benefits and agreed to proceed.    FINDINGS:  1.  She does have a large right lower lobe mass.  2.  She has pleural adhesions around the mass mostly involving the right lower lobe.  This is likely benign adhesions from previous surgery.  3.  She has an additional pleural mass which is very close but not directly associated with the primary cancer, some mucin material is seen in the mass.    PROCEDURE:  After the informed consent obtained, the patient was brought into the operating room and left in supine position.  General anesthesia was administered.  The patient was placed with double-lumen tube and then replaced to the left decubitus position with the right side up.  Her right chest was prepped and draped in a routine fashion.  Then, we started the thoracoscopy and the first trocar was placed in the anterior axillary line, seventh intercostal space and pleural cavity was entered under direct vision.   The pleural cavity was inspected.  There were no obvious pleural disease, but there was quite a bit adhesions around the lower lobe, especially there were mass structures in the right posterior chest wall.  I placed two additional trocars, one at the seventh intercostal space posterior axillary line and the fourth intercostal space mid axillary line.  I started lysis of adhesions and then the lower lobe was carefully dissected, and then I dissected the chest wall, around the chest wall mass and dissection was carried right along the rib and then the rib was stripped.  I saw some gelatin materials during the dissection.  This was removed separately and the mass was able to completely dissect off the chest wall and then I marked the chest wall resection site with surgical clips in case future radiation might be needed, and then I continued to dissect the inferior pulmonary ligaments and the lower lobe was able to be freed eventually.  At this point, I decided to proceed with thoracoscopy.    The upper trocar site was switched to a mini thoracotomy incision and then the inferior pulmonary ligament was continually dissected.  Two paraesophageal lymph nodes were removed separately and dissection continued cephalad, and I was then able to isolate the inferior pulmonary vein circumferentially.  This was divided with an endovascular stapler and  then dissection continued along both anterior and posterior to the hilum.  The major fissure was dissected and then interlobar lymph nodes were identified.  This was dissected to reveal the pulmonary artery trunk to the lower lobe and she has a relatively complete fissure and I was then able to identify the inferior lobe bronchus on the posterior portion of the fissure and at this point, I was able to place a stapler on the first time as a blue load and this was used to divide the major fissure on the anterior portion and the pulmonary arteries were divided at the same time and then  the second load was switched to a black load and the stapler was able to be placed across the major fissure along the lower lobe bronchus.  The upper lobe was inflated before I fired the stapler.  The stapler was fired and the major fissure was finished with another load with a blue color load and then the lower lobe was completely dissected off.  An Endo bag was used.  The specimen was placed in the Endo bag and I have enlarged the utility thoracotomy to be able to remove the specimen from the surgical field.  The specimen was examined at the back table.  The tumor was intact and the chest wall mass appeared to be separated from the primary cancer, although it was very close.  Then, we continued the lymph node dissection.  Several #7 lymph node were removed and there were no additional lymph nodes identified.  Then, the bronchial stump was tested underwater and no evidence of air leak.  Then, the intercostal nerve cryoablation was performed to cover thoracotomy and chest tube site.  Two chest tubes were placed, one to the apex and one along the diaphragm.  Then, the mini thoracotomy incision was closed on the fascia with a 2-0 running Vicryl stitch and all skin incisions were closed with staples.  The patient tolerated the procedure well and was transferred to the recovery room in stable condition.  All the instrument count and lap count were correct.    As noted, Crystal Culpepper is the first assistant in this case.  She offered excellent exposure to make this surgery quicker and safer, especially she helped with the stapling and it would be difficult or dangerous to perform this case without her assistance.      Thayer Jew, MD      [id number]  D:  05/07/2020 18:01  T:  05/07/2020 23:46  JOB #:  [id number]  CC:

## 2020-05-07 NOTE — Interval H&P Note (Signed)
 TRANSFER - OUT REPORT:    Verbal report given to Marcey, RN on Audrey Snow being transferred to room 237 for routine post - op.    Report consisted of patient's Situation, Background, Assessment and Recommendations (SBAR).     Information from the following report(s) SBAR, OR Summary, Procedure Summary, Intake/Output and MAR was reviewed with the receiving nurse.    Lines:   Peripheral IV 05/07/20 Left;Posterior Wrist (Active)   Site Assessment Clean, dry, & intact 05/07/20 1500   Phlebitis Assessment 0 05/07/20 1500   Infiltration Assessment 0 05/07/20 1500   Dressing Status Clean, dry, & intact;Occlusive 05/07/20 1500   Dressing Type Tape;Transparent 05/07/20 1500   Hub Color/Line Status Green;Patent 05/07/20 1500   Alcohol Cap Used No 05/07/20 1500       Peripheral IV 05/07/20 Left Forearm (Active)   Site Assessment Clean, dry, & intact 05/07/20 1500   Phlebitis Assessment 0 05/07/20 1500   Infiltration Assessment 0 05/07/20 1500   Dressing Status Clean, dry, & intact;Occlusive 05/07/20 1500   Dressing Type Tape;Transparent 05/07/20 1500   Hub Color/Line Status Green;Infusing;Patent 05/07/20 1500   Alcohol Cap Used No 05/07/20 1500        Opportunity for questions and clarification was provided.      Patient transported with:   O2 @ 4 liters    VTE prophylaxis orders have been written for Audrey Snow.    Patient and family given floor number and nurses name.  Family updated re: pt status after security code verified.

## 2020-05-07 NOTE — Interval H&P Note (Signed)
Attempted to call report to floor nurse, who was unavailable at this time. Will call back.

## 2020-05-07 NOTE — Op Note (Signed)
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      Procedure(s):  1. RIGHT VATS, LOWER LOBECTOMY with lysis of adhesions,   2. Chest wall mass resection   3. MEDIASTINAL LYMPHADENECTOMY,   4. CRYOABLATION OF INTERCOSTAL NERVES    Surgeon(s):  Thayer Jew, MD    Surgical Assistant: Nurse Practitioner: Jake Shark, NP    Anesthesia: General     Estimated Blood Loss (mL): less than 409     Complications: None    Specimens:   ID Type Source Tests Collected by Time Destination   1 : chest wall mass contents Preservative Chest  Thayer Jew, MD 05/07/2020 1135 Pathology   2 : #8 lymph node x2 Preservative Chest  Thayer Jew, MD 05/07/2020 1138 Pathology   3 : #7 lymph node x4 Preservative Chest  Thayer Jew, MD 05/07/2020 1203 Pathology   4 : interlobar lymph node Preservative Chest  Thayer Jew, MD 05/07/2020 1226 Pathology   5 : right lower lobe of lung Fresh Chest  Thayer Jew, MD 05/07/2020 1300 Pathology        Implants: * No implants in log *    Drains: * No LDAs found *    Findings: 1 large right lower lobe mass; 2 pleural adhesion around mass, this is likely benign adhesions from previous surgery; 3. Additional pleural mass is close but not directly associated with primary cancer.     Electronically Signed by Thayer Jew, MD on 05/07/2020 at 3:52 PM

## 2020-05-07 NOTE — Progress Notes (Signed)
Chaplain's pre-procedure visit and prayer with patient as requested.    Adrian Duckett, MDiv, BS  Board Certified Chaplain

## 2020-05-07 NOTE — Anesthesia Post-Procedure Evaluation (Signed)
Procedure(s):  RIGHT VATS, LOWER LOBECTOMY, MEDIASTINAL LYMPHADENECTOMY, CRYOABLATION OF INTERCOSTAL NERVES.    general    Anesthesia Post Evaluation      Multimodal analgesia: multimodal analgesia used between 6 hours prior to anesthesia start to PACU discharge  Patient location during evaluation: PACU  Patient participation: complete - patient participated  Level of consciousness: awake and alert  Pain management: adequate  Airway patency: patent  Anesthetic complications: no  Cardiovascular status: acceptable  Respiratory status: acceptable  Hydration status: acceptable  Post anesthesia nausea and vomiting:  none  Final Post Anesthesia Temperature Assessment:  Normothermia (36.0-37.5 degrees C)      INITIAL Post-op Vital signs:   Vitals Value Taken Time   BP 99/46 05/07/20 1559   Temp 36.7 ??C (98 ??F) 05/07/20 1515   Pulse 61 05/07/20 1559   Resp 16 05/07/20 1545   SpO2 95 % 05/07/20 1559   Vitals shown include unvalidated device data.

## 2020-05-07 NOTE — Progress Notes (Signed)
 END OF SHIFT NOTE:    INTAKE/OUTPUT  No intake/output data recorded.  Voiding: YES  Catheter: YES  Drain:  Chest tube x2            Flatus: Patient does not have flatus present.    Stool:  0 occurrences.    Characteristics:       Emesis: 0 occurrences.    Characteristics:        VITAL SIGNS  Patient Vitals for the past 12 hrs:   Temp Pulse Resp BP SpO2   05/07/20 1636 -- 72 -- -- --   05/07/20 1628 97.8 F (36.6 C) (!) 108 18 126/60 99 %   05/07/20 1600 -- 60 18 (!) 102/45 95 %   05/07/20 1545 -- 69 16 (!) 109/53 95 %   05/07/20 1530 -- 80 20 120/64 95 %   05/07/20 1515 98 F (36.7 C) 78 18 (!) 92/52 96 %   05/07/20 1500 -- 72 18 (!) 108/56 96 %   05/07/20 1449 -- 89 -- (!) 144/61 96 %   05/07/20 1444 -- 71 16 123/68 92 %   05/07/20 1439 -- 79 18 (!) 128/58 95 %   05/07/20 1434 -- 78 16 (!) 113/54 93 %   05/07/20 1429 -- 70 18 (!) 100/51 94 %   05/07/20 1424 -- 76 16 (!) 114/54 92 %   05/07/20 1419 -- 75 16 (!) 119/51 93 %   05/07/20 1414 -- 81 18 (!) 117/53 94 %   05/07/20 1412 -- 88 16 (!) 109/56 93 %   05/07/20 1411 97.8 F (36.6 C) 79 14 129/62 95 %   05/07/20 1410 -- 90 16 129/62 95 %   05/07/20 1404 -- 85 16 129/62 92 %       Pain Assessment  Pain Intensity 1: 2 (05/07/20 1637)  Pain Location 1: Flank  Pain Intervention(s) 1: Declines, Repositioned  Patient Stated Pain Goal: 0    Ambulating  NO    Shift report given to oncoming nurse at the bedside.    Marcey Maier, RN

## 2020-05-07 NOTE — Interval H&P Note (Signed)
Bedside shift report given to Riki Rusk, [place]. To assume care at this time.

## 2020-05-07 NOTE — Interval H&P Note (Signed)
[id number]: Right radial arterial line removed by Grant Ruts, RN. Catheter tip intact. Pressure held for 5 minutes. Gauze/tape pressure dressing applied to right wrist.

## 2020-05-08 ENCOUNTER — Inpatient Hospital Stay: Admit: 2020-05-08 | Payer: MEDICARE | Primary: Internal Medicine

## 2020-05-08 LAB — CBC WITH AUTO DIFFERENTIAL
Basophils %: 0 % (ref 0.0–2.0)
Basophils Absolute: 0 10*3/uL (ref 0.0–0.2)
Eosinophils %: 0 % — ABNORMAL LOW (ref 0.5–7.8)
Eosinophils Absolute: 0 10*3/uL (ref 0.0–0.8)
Granulocyte Absolute Count: 0 10*3/uL (ref 0.0–0.5)
Hematocrit: 34.4 % — ABNORMAL LOW (ref 35.8–46.3)
Hemoglobin: 11 g/dL — ABNORMAL LOW (ref 11.7–15.4)
Immature Granulocytes: 0 % (ref 0.0–5.0)
Lymphocytes %: 9 % — ABNORMAL LOW (ref 13–44)
Lymphocytes Absolute: 1 10*3/uL (ref 0.5–4.6)
MCH: 31.7 PG (ref 26.1–32.9)
MCHC: 32 g/dL (ref 31.4–35.0)
MCV: 99.1 FL — ABNORMAL HIGH (ref 79.6–97.8)
MPV: 9.9 FL (ref 9.4–12.3)
Monocytes %: 10 % (ref 4.0–12.0)
Monocytes Absolute: 1.2 10*3/uL (ref 0.1–1.3)
NRBC Absolute: 0 10*3/uL (ref 0.0–0.2)
Neutrophils %: 81 % — ABNORMAL HIGH (ref 43–78)
Neutrophils Absolute: 9.6 10*3/uL — ABNORMAL HIGH (ref 1.7–8.2)
Platelets: 253 10*3/uL (ref 150–450)
RBC: 3.47 M/uL — ABNORMAL LOW (ref 4.05–5.2)
RDW: 14 % (ref 11.9–14.6)
WBC: 11.9 10*3/uL — ABNORMAL HIGH (ref 4.3–11.1)

## 2020-05-08 LAB — BASIC METABOLIC PANEL
Anion Gap: 4 mmol/L — ABNORMAL LOW (ref 7–16)
BUN: 9 MG/DL (ref 8–23)
CO2: 25 mmol/L (ref 21–32)
Calcium: 7.7 MG/DL — ABNORMAL LOW (ref 8.3–10.4)
Chloride: 107 mmol/L (ref 98–107)
Creatinine: 0.66 MG/DL (ref 0.6–1.0)
EGFR IF NonAfrican American: >60 mL/min/{1.73_m2} (ref >60)
GFR African American: >60 mL/min/{1.73_m2} (ref >60)
Glucose: 139 mg/dL — ABNORMAL HIGH (ref 65–100)
Potassium: 4.7 mmol/L (ref 3.5–5.1)
Sodium: 136 mmol/L (ref 136–145)

## 2020-05-08 LAB — CBC WITH AUTOMATED DIFF
ABS. BASOPHILS: 0 10*3/uL (ref 0.0–0.2)
ABS. EOSINOPHILS: 0 10*3/uL (ref 0.0–0.8)
ABS. IMM. GRANS.: 0 10*3/uL (ref 0.0–0.5)
ABS. LYMPHOCYTES: 1 10*3/uL (ref 0.5–4.6)
ABS. MONOCYTES: 1.2 10*3/uL (ref 0.1–1.3)
ABS. NEUTROPHILS: 9.6 10*3/uL — ABNORMAL HIGH (ref 1.7–8.2)
ABSOLUTE NRBC: 0 10*3/uL (ref 0.0–0.2)
BASOPHILS: 0 % (ref 0.0–2.0)
EOSINOPHILS: 0 % — ABNORMAL LOW (ref 0.5–7.8)
HCT: 34.4 % — ABNORMAL LOW (ref 35.8–46.3)
HGB: 11 g/dL — ABNORMAL LOW (ref 11.7–15.4)
IMMATURE GRANULOCYTES: 0 % (ref 0.0–5.0)
LYMPHOCYTES: 9 % — ABNORMAL LOW (ref 13–44)
MCH: 31.7 PG (ref 26.1–32.9)
MCHC: 32 g/dL (ref 31.4–35.0)
MCV: 99.1 FL — ABNORMAL HIGH (ref 79.6–97.8)
MONOCYTES: 10 % (ref 4.0–12.0)
MPV: 9.9 FL (ref 9.4–12.3)
NEUTROPHILS: 81 % — ABNORMAL HIGH (ref 43–78)
PLATELET: 253 10*3/uL (ref 150–450)
RBC: 3.47 M/uL — ABNORMAL LOW (ref 4.05–5.2)
RDW: 14 % (ref 11.9–14.6)
WBC: 11.9 10*3/uL — ABNORMAL HIGH (ref 4.3–11.1)

## 2020-05-08 LAB — METABOLIC PANEL, BASIC
Anion gap: 4 mmol/L — ABNORMAL LOW (ref 7–16)
BUN: 9 MG/DL (ref 8–23)
CO2: 25 mmol/L (ref 21–32)
Calcium: 7.7 MG/DL — ABNORMAL LOW (ref 8.3–10.4)
Chloride: 107 mmol/L (ref 98–107)
Creatinine: 0.66 MG/DL (ref 0.6–1.0)
GFR est AA: >60 mL/min/{1.73_m2} (ref >60)
GFR est non-AA: >60 mL/min/{1.73_m2} (ref >60)
Glucose: 139 mg/dL — ABNORMAL HIGH (ref 65–100)
Potassium: 4.7 mmol/L (ref 3.5–5.1)
Sodium: 136 mmol/L (ref 136–145)

## 2020-05-08 MED FILL — ATORVASTATIN 20 MG TAB: 20 mg | ORAL | Qty: 1

## 2020-05-08 MED FILL — CEFAZOLIN 1 GRAM SOLUTION FOR INJECTION: 1 gram | INTRAMUSCULAR | Qty: 1000

## 2020-05-08 MED FILL — GABAPENTIN 300 MG CAP: 300 mg | ORAL | Qty: 1

## 2020-05-08 MED FILL — STOOL SOFTENER-STIMULANT LAXATIVE 8.6 MG-50 MG TABLET: ORAL | Qty: 2

## 2020-05-08 MED FILL — IPRATROPIUM-ALBUTEROL 2.5 MG-0.5 MG/3 ML NEB SOLUTION: 2.5 mg-0.5 mg/3 ml | RESPIRATORY_TRACT | Qty: 3

## 2020-05-08 MED FILL — MUCUS RELIEF ER 600 MG TABLET, EXTENDED RELEASE: 600 mg | ORAL | Qty: 2

## 2020-05-08 MED FILL — METOPROLOL SUCCINATE SR 50 MG 24 HR TAB: 50 mg | ORAL | Qty: 1

## 2020-05-08 MED FILL — OXYCODONE 5 MG TAB: 5 mg | ORAL | Qty: 2

## 2020-05-08 MED FILL — FLUTICASONE 50 MCG/ACTUATION NASAL SPRAY, SUSP: 50 mcg/actuation | NASAL | Qty: 16

## 2020-05-08 MED FILL — LEVOTHYROXINE 100 MCG TAB: 100 mcg | ORAL | Qty: 1

## 2020-05-08 MED FILL — LOVENOX 40 MG/0.4 ML SUBCUTANEOUS SYRINGE: 40 mg/0.4 mL | SUBCUTANEOUS | Qty: 0.4

## 2020-05-08 MED FILL — HYDROMORPHONE (PF) 1 MG/ML IJ SOLN: 1 mg/mL | INTRAMUSCULAR | Qty: 1

## 2020-05-08 MED FILL — PANTOPRAZOLE 40 MG TAB, DELAYED RELEASE: 40 mg | ORAL | Qty: 1

## 2020-05-08 MED FILL — OXYCODONE 5 MG TAB: 5 mg | ORAL | Qty: 1

## 2020-05-08 NOTE — Progress Notes (Signed)
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 Pain controlled. AF, VSS. Chest tubes with out air leak. 2 L oxygen      Intake/Output Summary (Last 24 hours) at 05/08/2020 1022  Last data filed at 05/08/2020 2542  Gross per 24 hour   Intake 2997.63 ml   Output 2073 ml   Net 924.63 ml     OBJECTIVE:  Constitutional: Alert oriented cooperative patient in no acute distress; appears stated age   Visit Vitals  BP (!) 109/53   Pulse 66   Temp 97.6 ??F (36.4 ??C)   Resp 20   Ht 5\' 10"  (1.778 m)   Wt 169 lb 9.6 oz (76.9 kg)   SpO2 94%   Breastfeeding No   BMI 24.34 kg/m??     Eyes:Sclera are clear.   ENMT: no external lesions, HOH; no obvious neck masses, no ear or lip lesions  CV: RRR. Normal perfusion  Resp: No JVD.  Breathing is  non-labored; no audible wheezing.  Chest tubes placed to water seal without air leak noted  GI: soft and non-distended     Musculoskeletal: unremarkable with normal function. No embolic signs or cyanosis.   Neuro:  Oriented; moves all 4; no focal deficits  Psychiatric: normal affect and mood, no memory impairment      Patient Vitals for the past 24 hrs:   BP Temp Pulse Resp SpO2 Height Weight   05/08/20 0952 ??? ??? ??? ??? 94 % ??? ???   05/08/20 0709 (!) 109/53 97.6 ??F (36.4 ??C) 66 20 95 % ??? ???   05/08/20 0320 120/64 97.5 ??F (36.4 ??C) 78 18 93 % ??? 169 lb 9.6 oz (76.9 kg)   05/08/20 0115 ??? ??? ??? ??? 95 % ??? ???   05/08/20 0030 ??? ??? 62 ??? ??? ??? ???   05/08/20 0000 117/66 97.3 ??F (36.3 ??C) (!) 44 18 94 %  ??? ???   05/07/20 2033 ??? ??? ??? ??? 97 % ??? ???   05/07/20 1930 128/60 97.4 ??F (36.3 ??C) 66 18 94 % ??? ???   05/07/20 1637 ??? ??? ??? ??? ??? 5\' 10"  (1.778 m) 169 lb 1.6 oz (76.7 kg)   05/07/20 1636 ??? ??? 72 ??? ??? ??? ???   05/07/20 1628 126/60 97.8 ??F (36.6 ??C) (!) 108 18 99 % ??? ???   05/07/20 1600 (!) 102/45 ??? 60 18 95 % ??? ???   05/07/20 1545 (!) 109/53 ??? 69 16 95 % ??? ???   05/07/20 1530 120/64 ??? 80 20 95 % ??? ???   05/07/20 1515 (!) 92/52 98 ??F (36.7 ??C) 78 18 96 % ??? ???   05/07/20 1500 (!) 108/56 ??? 72 18 96 % ??? ???   05/07/20 1449 (!) 144/61 ??? 89 ??? 96 % ??? ???   05/07/20 1444 123/68 ??? 71 16 92 % ??? ???  05/07/20 1439 (!) 128/58 ??? 79 18 95 % ??? ???   05/07/20 1434 (!) 113/54 ??? 78 16 93 % ??? ???   05/07/20 1429 (!) 100/51 ??? 70 18 94 % ??? ???   05/07/20 1424 (!) 114/54 ??? 76 16 92 % ??? ???   05/07/20 1419 (!) 119/51 ??? 75 16 93 % ??? ???   05/07/20 1414 (!) 117/53 ??? 81 18 94 % ??? ???   05/07/20 1412 (!) 109/56 ??? 88 16 93 % ??? ???   05/07/20 1411 129/62 97.8 ??F (36.6 ??C) 79 14 95 % ??? ???   05/07/20 1410 129/62 ??? 90 16 95 % ??? ???   05/07/20 1404 129/62 ??? 85 16 92 % ??? ???     Labs:    Recent Labs     05/08/20  0510   WBC 11.9*   HGB 11.0*   PLT 253   NA 136   K 4.7   CL 107   CO2 25   BUN 9   CREA 0.66   GLU 139*     XR Results (most recent):  Results from Hospital Encounter encounter on 05/07/20    XR CHEST SNGL V    Narrative  EXAMINATION: One view chest    HISTORY: post op lobectomy    TECHNIQUE: Frontal chest.    COMPARISON: 05/07/2020    FINDINGS:  Stable supportive devices.    Persistent bibasilar lung infiltrates.    There is no significant pneumothorax.    There is no pleural effusion. The heart is unchanged.    No other significant interval changes.    Impression  1. Findings as described above.            Signed:  Jake Shark, NP

## 2020-05-08 NOTE — Progress Notes (Signed)
END OF SHIFT NOTE:    INTAKE/OUTPUT  11/01 0701 - 11/02 0700  In: 2997.6 [P.O.:240; I.V.:2757.6]  Out: 1923 [Urine:1525]  Voiding: YES  Catheter: NO  Drain: Chest Tube x2            Flatus: Patient does have flatus present.    Stool:  0 occurrences.    Characteristics:       Emesis: 0 occurrences.    Characteristics:        VITAL SIGNS  Patient Vitals for the past 12 hrs:   Temp Pulse Resp BP SpO2   05/08/20 1531 97.8 F (36.6 C) 96 20 133/67 93 %   05/08/20 1348 -- -- -- -- 92 %   05/08/20 0952 -- -- -- -- 94 %       Pain Assessment  Pain Intensity 1: 3 (05/08/20 1420)  Pain Location 1: Chest, Shoulder  Pain Intervention(s) 1: Declines  Patient Stated Pain Goal: 0    Ambulating  Yes    Shift report given to oncoming nurse at the bedside.    Altamese Carolina, RN

## 2020-05-08 NOTE — Progress Notes (Signed)
 END OF SHIFT NOTE:    INTAKE/OUTPUT  11/01 0701 - 11/02 0700  In: 2997.6 [P.O.:240; I.V.:2757.6]  Out: 1923 [Urine:1525]  Voiding: NO  Catheter: YES  Drain:      Flatus: Patient does not have flatus present.    Stool:  0 occurrences.    Characteristics:       Emesis: 0 occurrences.    Characteristics:        VITAL SIGNS  Patient Vitals for the past 12 hrs:   Temp Pulse Resp BP SpO2   05/08/20 0320 97.5 F (36.4 C) 78 18 120/64 93 %   05/08/20 0115 -- -- -- -- 95 %   05/08/20 0030 -- 62 -- -- --   05/08/20 0000 97.3 F (36.3 C) (!) 44 18 117/66 94 %   05/07/20 2033 -- -- -- -- 97 %   05/07/20 1930 97.4 F (36.3 C) 66 18 128/60 94 %       Pain Assessment  Pain Intensity 1: 0 (05/08/20 0619)  Pain Location 1: Flank  Pain Intervention(s) 1: Medication (see MAR)  Patient Stated Pain Goal: 3    Ambulating  No    Shift report given to oncoming nurse at the bedside.    Rowena L Williford

## 2020-05-08 NOTE — Progress Notes (Signed)
 ACUTE PHYSICAL THERAPY GOALS:  (Developed with and agreed upon by patient and/or caregiver.)    LTG:  (1.)Audrey Snow will move from supine to sit and sit to supine , scoot up and down and roll side to side in bed with MODIFIED INDEPENDENCE within 7 treatment day(s).    (2.)Audrey Snow will transfer from bed to chair and chair to bed with MODIFIED INDEPENDENCE using the least restrictive device within 7 treatment day(s).    (3.)Audrey Snow will ambulate with SUPERVISION for 250 feet with the least restrictive device within 7 treatment day(s).  (4.)Audrey Snow will participate in therapeutic activity/exercises x 24 minutes for increased activity tolerance within 7 treatment days.      ________________________________________________________________________________________________      PHYSICAL THERAPY ASSESSMENT: Initial Assessment and AM PT Treatment Day # 1      Audrey Snow is a 79 y.o. 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 Home Health Therapy  Equipment:   . To Be Determined     PRIOR LEVEL OF FUNCTION:  (Prior to Hospitalization) INITIAL/CURRENT LEVEL OF FUNCTION:  (Most Recently Demonstrated)   Bed Mobility:  . Independent  Sit to Stand:  . Independent  Transfers:  . Independent  Gait/Mobility:  . Independent Bed  Mobility:  . Standby Assistance  Sit to Stand:  . Contact Guard Assistance  Transfers:  . Geophysicist/field seismologist  Gait/Mobility:  . Contact Guard Assistance     ASSESSMENT:  Audrey Snow presents to PT with [facility] AROM and decreased strength in B LEs.  Pt performed bed mobility today with SBA and additional time.  She also stood and ambulated with RW/CGA.  Pt presented today with fair standing balance and decreased activity tolerance.  She had several episodes of dizziness with BP recorded at 114/50.  Pt also appeared to become SOB quickly and SpO2 recorded at 92% on 2 L/min.  Audrey Snow could benefit from skilled PT as she is currently functioning below her baseline.        SUBJECTIVE:   Audrey Snow states, I was just about to go to sleep.    SOCIAL HISTORY/LIVING ENVIRONMENT: Lives with spouse and independent with ambulation PTA     OBJECTIVE:     PAIN: VITAL SIGNS: LINES/DRAINS:   Pre Treatment: Pain Screen  Pain Scale 1: Numeric (0 - 10)  Pain Intensity 1: 0  Post Treatment: 3   Chest Tube, Foley Catheter and IV  O2 Device: Nasal cannula     GROSS EVALUATION:  B LE Within Functional Limits Abnormal/ Functional Abnormal/ Non-Functional (see comments) Not Tested Comments:   AROM [x]  []  []  []     PROM []  []  []  []     Strength []  [x]  []  []  Generalized weakness   Balance []  [x]  []  []  Fair standing   Posture []  []  []  []   Sensation []  []  []  []     Coordination []  []  []  []     Tone []  []  []  []     Edema []  []  []  []     Activity Tolerance []  []  [x]  []  Fatigued quickly    []  []  []  []       COGNITION/  PERCEPTION: Intact Impaired   (see comments) Comments:   Orientation [x]  []     Vision []  []     Hearing []  [x]  Very HOH (better on L)   Command Following [x]  []     Safety Awareness [x]  []      []  []       MOBILITY: I Mod I S SBA CGA Min Mod Max Total  NT x2 Comments:   Bed Mobility    Rolling []  []  []  [x]  []  []  []  []  []  []  []     Supine to Sit []  []  []  [x]  []  []  []  []  []  []  []     Scooting []  []  []  [x]  []  []  []  []  []  []  []     Sit to Supine  []  []  []  []  []  []  []  []  []  []  []     Transfers    Sit to Stand []  []  []  []  [x]  []  []  []  []  []  []     Bed to Chair []  []  []  []  [x]  []  []  []  []  []  []     Stand to Sit []  []  []  []  [x]  []  []  []  []  []  []     I=Independent, Mod I=Modified Independent, S=Supervision, SBA=Standby Assistance, CGA=Contact Guard Assistance,   Min=Minimal Assistance, Mod=Moderate Assistance, Max=Maximal Assistance, Total=Total Assistance, NT=Not Tested  GAIT: I Mod I S SBA CGA Min Mod Max Total  NT x2 Comments:   Level of Assistance []  []  []  []  [x]  []  []  []  []  []  []     Distance 80 ft    DME Rolling Walker    Gait Quality Decreased gait speed    Weightbearing Status N/A     I=Independent, Mod I=Modified Independent, S=Supervision, SBA=Standby Assistance, CGA=Contact Guard Assistance,   Min=Minimal Assistance, Mod=Moderate Assistance, Max=Maximal Assistance, Total=Total Assistance, NT=Not Tested    [employer] AM-PACT "6 Clicks"   Basic Mobility Inpatient Short Form       How much difficulty does the patient currently have... Unable A Lot A Little None   1.  Turning over in bed (including adjusting bedclothes, sheets and blankets)?   []  1   []  2   []  3   [x]  4   2.  Sitting down on and standing up from a chair with arms ( e.g., wheelchair, bedside commode, etc.)   []  1   []  2   [x]  3   []  4   3.  Moving from lying on back to sitting on the side of the bed?   []  1   []  2   [x]  3   []  4   How much help from another person does the patient currently need... Total A Lot A Little None   4.  Moving to and from a bed to a chair (including a wheelchair)?   []  1   []  2   [x]  3   []  4   5.  Need to walk in hospital room?   []  1   []  2   [x]  3   []  4   6.  Climbing 3-5 steps with a railing?   []  1   []  2   [x]  3   []  4    2007, Trustees of  [employer], under license to [place]. All rights reserved     Score:  Initial: 19 Most Recent: X (Date: -- )    Interpretation of Tool:  Represents activities that are increasingly more difficult (i.e. Bed  mobility, Transfers, Gait).    PLAN:   FREQUENCY/DURATION: PT Plan of Care: 3 times/week for duration of hospital stay or until stated goals are met, whichever comes first.    PROBLEM LIST:   (Skilled intervention is medically necessary to address:)  1. Decreased Activity Tolerance  2. Decreased Balance  3. Decreased Gait Ability  4. Decreased Strength  5. Decreased Transfer Abilities  6. Increased Pain   INTERVENTIONS PLANNED:   (Benefits and precautions of physical therapy have been discussed with the patient.)  1. Therapeutic Activity  2. Therapeutic Exercise/HEP  3. Neuromuscular Re-education  4. Gait Training  5. Manual Therapy  6. Education     TREATMENT:     EVALUATION: Low Complexity : (Untimed Charge)    TREATMENT:   ($$ Therapeutic Activity: 23-37 mins    )  Therapeutic Activity (23 Minutes): Therapeutic activity included Supine to Sit, Scooting, Transfer Training, Ambulation on level ground and Standing balance to improve functional Mobility, Strength and Activity tolerance.    TREATMENT GRID:  N/A    AFTER TREATMENT POSITION/PRECAUTIONS:  Chair, Needs within reach, RN notified and Visitors at bedside    INTERDISCIPLINARY COLLABORATION:  RN/PCT and PT/PTA    TOTAL TREATMENT DURATION:  PT Patient Time In/Time Out  Time In: 1040  Time Out: 1110    Comer DELENA Moats, PT, DPT

## 2020-05-09 ENCOUNTER — Inpatient Hospital Stay: Admit: 2020-05-09 | Payer: MEDICARE | Primary: Internal Medicine

## 2020-05-09 LAB — CBC WITH AUTO DIFFERENTIAL
Basophils %: 0 % (ref 0.0–2.0)
Basophils Absolute: 0 10*3/uL (ref 0.0–0.2)
Eosinophils %: 0 % — ABNORMAL LOW (ref 0.5–7.8)
Eosinophils Absolute: 0 10*3/uL (ref 0.0–0.8)
Granulocyte Absolute Count: 0 10*3/uL (ref 0.0–0.5)
Hematocrit: 33.2 % — ABNORMAL LOW (ref 35.8–46.3)
Hemoglobin: 10.1 g/dL — ABNORMAL LOW (ref 11.7–15.4)
Immature Granulocytes: 0 % (ref 0.0–5.0)
Lymphocytes %: 16 % (ref 13–44)
Lymphocytes Absolute: 1.7 10*3/uL (ref 0.5–4.6)
MCH: 30 PG (ref 26.1–32.9)
MCHC: 30.4 g/dL — ABNORMAL LOW (ref 31.4–35.0)
MCV: 98.5 FL — ABNORMAL HIGH (ref 79.6–97.8)
MPV: 9.9 FL (ref 9.4–12.3)
Monocytes %: 10 % (ref 4.0–12.0)
Monocytes Absolute: 1.1 10*3/uL (ref 0.1–1.3)
NRBC Absolute: 0 10*3/uL (ref 0.0–0.2)
Neutrophils %: 73 % (ref 43–78)
Neutrophils Absolute: 7.8 10*3/uL (ref 1.7–8.2)
Platelets: 277 10*3/uL (ref 150–450)
RBC: 3.37 M/uL — ABNORMAL LOW (ref 4.05–5.2)
RDW: 14.4 % (ref 11.9–14.6)
WBC: 10.7 10*3/uL (ref 4.3–11.1)

## 2020-05-09 LAB — BASIC METABOLIC PANEL
Anion Gap: 3 mmol/L — ABNORMAL LOW (ref 7–16)
BUN: 7 MG/DL — ABNORMAL LOW (ref 8–23)
CO2: 30 mmol/L (ref 21–32)
Calcium: 8.5 MG/DL (ref 8.3–10.4)
Chloride: 107 mmol/L (ref 98–107)
Creatinine: 0.63 MG/DL (ref 0.6–1.0)
EGFR IF NonAfrican American: >60 mL/min/{1.73_m2} (ref >60)
GFR African American: >60 mL/min/{1.73_m2} (ref >60)
Glucose: 119 mg/dL — ABNORMAL HIGH (ref 65–100)
Potassium: 4.7 mmol/L (ref 3.5–5.1)
Sodium: 140 mmol/L (ref 136–145)

## 2020-05-09 LAB — CBC WITH AUTOMATED DIFF
ABS. BASOPHILS: 0 10*3/uL (ref 0.0–0.2)
ABS. EOSINOPHILS: 0 10*3/uL (ref 0.0–0.8)
ABS. IMM. GRANS.: 0 10*3/uL (ref 0.0–0.5)
ABS. LYMPHOCYTES: 1.7 10*3/uL (ref 0.5–4.6)
ABS. MONOCYTES: 1.1 10*3/uL (ref 0.1–1.3)
ABS. NEUTROPHILS: 7.8 10*3/uL (ref 1.7–8.2)
ABSOLUTE NRBC: 0 10*3/uL (ref 0.0–0.2)
BASOPHILS: 0 % (ref 0.0–2.0)
EOSINOPHILS: 0 % — ABNORMAL LOW (ref 0.5–7.8)
HCT: 33.2 % — ABNORMAL LOW (ref 35.8–46.3)
HGB: 10.1 g/dL — ABNORMAL LOW (ref 11.7–15.4)
IMMATURE GRANULOCYTES: 0 % (ref 0.0–5.0)
LYMPHOCYTES: 16 % (ref 13–44)
MCH: 30 PG (ref 26.1–32.9)
MCHC: 30.4 g/dL — ABNORMAL LOW (ref 31.4–35.0)
MCV: 98.5 FL — ABNORMAL HIGH (ref 79.6–97.8)
MONOCYTES: 10 % (ref 4.0–12.0)
MPV: 9.9 FL (ref 9.4–12.3)
NEUTROPHILS: 73 % (ref 43–78)
PLATELET: 277 10*3/uL (ref 150–450)
RBC: 3.37 M/uL — ABNORMAL LOW (ref 4.05–5.2)
RDW: 14.4 % (ref 11.9–14.6)
WBC: 10.7 10*3/uL (ref 4.3–11.1)

## 2020-05-09 LAB — METABOLIC PANEL, BASIC
Anion gap: 3 mmol/L — ABNORMAL LOW (ref 7–16)
BUN: 7 MG/DL — ABNORMAL LOW (ref 8–23)
CO2: 30 mmol/L (ref 21–32)
Calcium: 8.5 MG/DL (ref 8.3–10.4)
Chloride: 107 mmol/L (ref 98–107)
Creatinine: 0.63 MG/DL (ref 0.6–1.0)
GFR est AA: >60 mL/min/{1.73_m2} (ref >60)
GFR est non-AA: >60 mL/min/{1.73_m2} (ref >60)
Glucose: 119 mg/dL — ABNORMAL HIGH (ref 65–100)
Potassium: 4.7 mmol/L (ref 3.5–5.1)
Sodium: 140 mmol/L (ref 136–145)

## 2020-05-09 MED FILL — LOVENOX 40 MG/0.4 ML SUBCUTANEOUS SYRINGE: 40 mg/0.4 mL | SUBCUTANEOUS | Qty: 0.4

## 2020-05-09 MED FILL — IPRATROPIUM-ALBUTEROL 2.5 MG-0.5 MG/3 ML NEB SOLUTION: 2.5 mg-0.5 mg/3 ml | RESPIRATORY_TRACT | Qty: 3

## 2020-05-09 MED FILL — ATORVASTATIN 20 MG TAB: 20 mg | ORAL | Qty: 1

## 2020-05-09 MED FILL — OXYCODONE 5 MG TAB: 5 mg | ORAL | Qty: 1

## 2020-05-09 MED FILL — STOOL SOFTENER-STIMULANT LAXATIVE 8.6 MG-50 MG TABLET: ORAL | Qty: 2

## 2020-05-09 MED FILL — GABAPENTIN 300 MG CAP: 300 mg | ORAL | Qty: 1

## 2020-05-09 MED FILL — OXYCODONE 5 MG TAB: 5 mg | ORAL | Qty: 2

## 2020-05-09 MED FILL — LEVOTHYROXINE 100 MCG TAB: 100 mcg | ORAL | Qty: 1

## 2020-05-09 MED FILL — MUCUS RELIEF ER 600 MG TABLET, EXTENDED RELEASE: 600 mg | ORAL | Qty: 2

## 2020-05-09 MED FILL — PANTOPRAZOLE 40 MG TAB, DELAYED RELEASE: 40 mg | ORAL | Qty: 1

## 2020-05-09 MED FILL — METOPROLOL SUCCINATE SR 50 MG 24 HR TAB: 50 mg | ORAL | Qty: 1

## 2020-05-09 NOTE — Progress Notes (Signed)
Posterior chest tube removed at end inspiration. Dressing applied. Patient tolerated well. CXR ordered for AM.    Jake Shark, NP

## 2020-05-09 NOTE — Progress Notes (Signed)
 END OF SHIFT NOTE:    INTAKE/OUTPUT  11/02 0701 - 11/03 0700  In: 1743.1 [P.O.:1040; I.V.:703.1]  Out: 3367 [Urine:3100]  Voiding: YES  Catheter: NO  Drain:  Anterior chest tube            Flatus: Patient does not have flatus present.    Stool:  0 occurrences.    Characteristics:       Emesis: 0 occurrences.    Characteristics:        VITAL SIGNS  Patient Vitals for the past 12 hrs:   Temp Pulse Resp BP SpO2   05/09/20 1912 98.2 F (36.8 C) (!) 50 18 133/69 93 %   05/09/20 1456 98.1 F (36.7 C) 62 16 117/61 96 %   05/09/20 1155 97.8 F (36.6 C) 85 15 129/72 92 %   05/09/20 1015 -- -- -- -- 94 %   05/09/20 0908 -- -- -- -- 93 %       Pain Assessment  Pain Intensity 1: 7 (05/09/20 1745)  Pain Location 1: Flank  Pain Intervention(s) 1: Medication (see MAR)  Patient Stated Pain Goal: 0    Ambulating  Yes    Shift report given to oncoming nurse at the bedside.    Marcey Maier, RN

## 2020-05-09 NOTE — Progress Notes (Signed)
 ACUTE PHYSICAL THERAPY GOALS:  (Developed with and agreed upon by patient and/or caregiver.)  LTG:  (1.)Audrey Snow will move from supine to sit and sit to supine , scoot up and down and roll side to side in bed with MODIFIED INDEPENDENCE within 7 treatment day(s).    (2.)Audrey Snow will transfer from bed to chair and chair to bed with MODIFIED INDEPENDENCE using the least restrictive device within 7 treatment day(s).    (3.)Audrey Snow will ambulate with SUPERVISION for 250 feet with the least restrictive device within 7 treatment day(s).  (4.)Audrey Snow will participate in therapeutic activity/exercises x 24 minutes for increased activity tolerance within 7 treatment days.    PHYSICAL THERAPY: Daily Note and PM Treatment Day # 2    Audrey Snow is a 79 y.o. 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 Home Health Therapy  Equipment:   . None Bed Mobility:  . Supervision  Sit to Stand:  . Standby Assistance  Transfers:  . Standby Assistance  Gait/Mobility:  . Standby Assistance     ASSESSMENT:  Audrey Snow was supine in bed and agreeable to PT.  She performed STS transfers and ambulation today with SBA/RW initially.  She to ambulated without RW and using SBA but reported feeling not as steady.  Pt transferred to chair and then participated in seated exercises.  She was 89% sitting on room air so kept on nasal cannula.  Pt progressed in ambulation and mobility today     SUBJECTIVE:   Audrey Snow states, Oh I have to get  up?.    SOCIAL HISTORY/ LIVING ENVIRONMENT: see eval  Home Environment: Private residence  # Steps to Enter: 3  One/Two Story Residence: One story  Living Alone: No  Support Systems: Spouse/Significant Other, Child(ren)  OBJECTIVE:     PAIN: VITAL SIGNS: LINES/DRAINS:   Pre Treatment: Pain Screen  Pain Scale 1: Numeric (0 - 10)  Pain Intensity 1: 4  Pain Onset 1: post op  Pain Location 1: Flank  Pain Orientation 1: Right  Post Treatment: 4   Chest Tube  O2 Device: Nasal cannula     MOBILITY: I Mod I S SBA CGA Min Mod Max Total  NT x2 Comments:   Bed Mobility    Rolling []  []  [x]  []  []  []  []  []  []  []  []     Supine to Sit []  []  [x]  []  []  []  []  []  []  []  []     Scooting []  []  []  []  []  []  []  []  []  []  []     Sit to Supine []  []  []  []  []  []  []  []  []  []  []     Transfers    Sit to Stand []  []  []  [x]  []  []  []  []  []  []  []     Bed to Chair []  []  []  [x]  []  []  []  []  []  []  []     Stand to Sit []  []  []  [x]  []  []  []  []  []  []  []     I=Independent, Mod I=Modified Independent, S=Supervision,  SBA=Standby Assistance, CGA=Contact Guard Assistance,   Min=Minimal Assistance, Mod=Moderate Assistance, Max=Maximal Assistance, Total=Total Assistance, NT=Not Tested    BALANCE: Good Fair+ Fair Fair- Poor NT Comments   Sitting Static [x]  []  []  []  []  []     Sitting Dynamic [x]  []  []  []  []  []               Standing Static []  [x]  []  []  []  []     Standing Dynamic []  []  [x]  []  []  []       GAIT: I Mod I S SBA CGA Min Mod Max Total  NT x2 Comments:   Level of Assistance []  []  []  [x]  []  []  []  []  []  []  []     Distance 40 ft    DME Rolling Walker    Gait Quality WFL    Weightbearing  Status N/A     I=Independent, Mod I=Modified Independent, S=Supervision, SBA=Standby Assistance, CGA=Contact Guard Assistance,   Min=Minimal Assistance, Mod=Moderate Assistance, Max=Maximal Assistance, Total=Total Assistance, NT=Not Tested    PLAN:   FREQUENCY/DURATION: PT Plan of Care: 3 times/week for duration of hospital stay or until stated goals are met, whichever comes  first.  TREATMENT:     TREATMENT:   ($$ Therapeutic Activity: 23-37 mins    )  Therapeutic Activity (23 Minutes): Therapeutic activity included Supine to Sit, Transfer Training, Ambulation on level ground, Standing balance and seated exercises to improve functional Mobility, Strength and Activity tolerance.    TREATMENT GRID:   Date:  05/09/20 Date:   Date:     Activity/Exercise Parameters Parameters Parameters   APs 1 x 20 B     LAQ 1 x 15 B     Seated marching 1 x 15 B     Hip ABD/ADD 1 x 15 B                             AFTER TREATMENT POSITION/PRECAUTIONS:  Chair, Needs within reach, RN notified and Visitors at bedside    INTERDISCIPLINARY COLLABORATION:  RN/PCT and PT/PTA    TOTAL TREATMENT DURATION:  PT Patient Time In/Time Out  Time In: 1348  Time Out: 1415    Comer DELENA Moats, PT, DPT

## 2020-05-09 NOTE — Progress Notes (Signed)
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 Pain controlled. AF, VSS. Chest tubes with out air leak. 2 L oxygen  05/09/20 2 days post op. Some complaints of mild incision pain. Chest tubes without air leak. 3L NC    Intake/Output Summary (Last 24 hours) at 05/09/2020 0751  Last data filed at 05/09/2020 1610  Gross per 24 hour   Intake 1743.1 ml   Output 3067 ml   Net -1323.9 ml     OBJECTIVE:  Constitutional: Alert oriented cooperative patient in no acute distress; appears stated age   Visit Vitals  BP (!) 119/58   Pulse 66   Temp 98.9 ??F (37.2 ??C)   Resp 18   Ht 5\' 10"  (1.778 m)   Wt 169 lb 9.6 oz (76.9 kg)   SpO2 94%   Breastfeeding No   BMI 24.34 kg/m??     Eyes:Sclera are clear.   ENMT: no external lesions, HOH; no obvious neck masses, no ear or lip lesions  CV: RRR. Normal perfusion  Resp: No JVD.  Breathing is  non-labored; no audible wheezing.  Chest tubes placed to water seal without air leak noted  GI: soft and non-distended     Musculoskeletal: unremarkable with normal function. No embolic signs or cyanosis.   Neuro:  Oriented; moves all 4; no focal deficits  Psychiatric: normal affect and mood, no memory impairment      Patient Vitals for the past 24 hrs:   BP Temp Pulse Resp SpO2   05/09/20 0320 (!) 119/58 98.9 ??F (37.2 ??C) 66 18 94 %   05/09/20 0200 ??? ??? ??? ??? 91 %   05/08/20 2337 108/67 98.8 ??F (37.1 ??C) 98 18 92 %   05/08/20 1936 ???  ??? ??? ??? 91 %   05/08/20 1916 136/70 98.3 ??F (36.8 ??C) 83 19 91 %   05/08/20 1531 133/67 97.8 ??F (36.6 ??C) 96 20 93 %   05/08/20 1348 ??? ??? ??? ??? 92 %   05/08/20 0952 ??? ??? ??? ??? 94 %     Labs:    Recent Labs     05/09/20  0458   WBC 10.7   HGB 10.1*   PLT 277   NA 140   K 4.7   CL 107   CO2 30   BUN 7*   CREA 0.63   GLU 119*     XR Results (most recent):  Results from Hospital Encounter encounter on 05/07/20    XR CHEST SNGL V    Narrative  EXAMINATION: One view chest    HISTORY: post op lobectomy    TECHNIQUE: Frontal chest.    COMPARISON: 05/08/2020    FINDINGS:  Stable supportive devices.    Persistent bibasilar lung infiltrates.    There is a tiny right inferior medial pneumothorax.  There is no pleural effusion. The heart is unchanged.    No other significant interval changes.    Impression  1. Findings as described above.            Signed:  Jake Shark, NP

## 2020-05-09 NOTE — Progress Notes (Signed)
 ACUTE OT GOALS:  (Developed with and agreed upon by patient and/or caregiver.)  1. Patient will complete lower body bathing and dressing with Independence.   2. Patient will complete toileting with Independence.   3. Patient will tolerate 23 minutes of OT treatment with 1-2 rest breaks to increase activity tolerance for ADLs.   4. Patient will complete functional transfers with Independence.   5. Patient will complete standing grooming ADL with Independence.    Timeframe: 7 visits       OCCUPATIONAL THERAPY ASSESSMENT: Initial Assessment, Daily Note and AM OT Treatment Day # 1    Audrey Snow is a 79 y.o. 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 No further skilled therapy  with continued progress.  Equipment:   . To Be Determined     PRIOR LEVEL OF FUNCTION:  (Prior to Hospitalization)  INITIAL/CURRENT LEVEL OF FUNCTION:  (Based on today's evaluation)   Bathing:  . Independent  Dressing:  . Independent  Feeding/Grooming:  . Independent  Toileting:  . Independent  Functional Mobility:  . Independent Bathing:  . Standby Assistance  Dressing:  . Standby Assistance  Feeding/Grooming:  . Supervision to brush teeth at sink.  Toileting:  . Standby Assistance  Functional Mobility:  . Standby Assistance      ASSESSMENT:  Audrey Snow was admitted for R lower lobe lung mass and is s/p lower lobectomy, lymphadenectomy, and cryoablation of intercostal nerves. Pt with two chest tubes in place. Presents with deficits in overall strength, balance, and activity tolerance for performance of ADLs and functional mobility. Requires Supervision to transfer to EOB and Supervision to complete sit <> stand transfers. Requires SBA for management of chest tubes to transfer from bed to chair. Of note, pt with O2 running, but doffed at start of session with O2 sats in mid to high 80s. O2 donned at 3L and O2 sats 91-93% with exertion and at rest remainder of session. Requires SBA for management of chest tubes to walk from chair to sink in preparation for grooming ADL. Requires Supervision to brush teeth. Requires SBA to walk from sink <> room door and return to chair. Pt is functioning slightly below her baseline and would benefit from continues skilled OT to increase independence and safety for performance of ADLs and functional mobility.      SUBJECTIVE:   [name] states, What? In reference to therapist speaking multiple times throughout session due to pt hearing deficits.     SOCIAL HISTORY/LIVING ENVIRONMENT: Lives with spouse in one story home with 2 STE. Independent for ADLs and functional mobility. Has RW available if needed. Reports a few slips and trips (over the curb) but no major falls. Drives.     OBJECTIVE:     PAIN: VITAL SIGNS: LINES/DRAINS:   Pre Treatment: Pain Screen  Pain Scale 1: Numeric (0 - 10)  Pain Intensity 1: 7  Post Treatment: Unchanged Vital Signs  O2 Sat (%): 93 %  O2 Device: Nasal cannula  O2 Flow Rate (L/min): 3 l/min Chest Tube and X 2  O2 Device: Nasal cannula     GROSS EVALUATION:  BUE Within Functional Limits Abnormal/ Functional Abnormal/ Non-Functional (see comments) Not Tested Comments:   AROM [x]  []  []  []     PROM []  []  []  [x]     Strength [x]  []  []  []     Balance [x]  []  []  []     Posture [x]  []  []  []      Sensation [x]  []  []  []     Coordination [x]  []  []  []     Tone []  []  []  [x]     Edema [x]  []  []  []     Activity Tolerance []  [x]  []  []  Nasal cannula    []  []  []  []       COGNITION/  PERCEPTION: Intact Impaired   (see comments) Comments:   Orientation [x]  []     Vision [x]  []     Hearing []  [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   Judgment/ Insight [x]  []     Attention [x]  []     Memory [x]  []     Command Following []  [x]  Due to hearing deficits.   Emotional Regulation [x]  []      []  []       ACTIVITIES OF DAILY LIVING: I Mod I S SBA CGA Min Mod Max Total NT Comments   BASIC ADLs:              Bathing/ Showering []  []  []  []  []  []  []  []  []  [x]     Toileting []  []  []  []  []  []  []  []  []  [x]     Dressing []  []  [x]  []  []  []  []  []  []  []  SBA sock management in seated.   Feeding []  []  []  []  []  []  []  []  []  [x]     Grooming []  []  [x]  []  []  []  []  []  []  []  Supervision to brush teeth at sink.   Personal Device Care []  []  []  []  []  []  []  []  []  [x]     Functional Mobility []  []  []  [x]  []  []  []  []  []  []  For management of chest tubes.   I=Independent, Mod I=Modified Independent, S=Supervision, SBA=Standby Assistance, CGA=Contact Guard Assistance,   Min=Minimal Assistance, Mod=Moderate Assistance, Max=Maximal Assistance, Total=Total Assistance, NT=Not Tested    MOBILITY: I Mod I S SBA CGA Min Mod Max Total  NT x2 Comments:   Supine to sit []  []  [x]  []  []  []  []  []  []  []  []     Sit to supine []  []  []  []  []  []  []  []  []  [x]  []     Sit to stand []  []  [x]  []  []  []  []  []  []  []  []     Bed to chair []  []  []  [x]  []  []  []  []  []  []  []  For management of chest tubes.   I=Independent, Mod I=Modified Independent, S=Supervision, SBA=Standby Assistance, CGA=Contact Guard Assistance,   Min=Minimal Assistance, Mod=Moderate Assistance, Max=Maximal Assistance, Total=Total Assistance, NT=Not Tested    [employer] AM-PACT "6 Clicks"   Daily Activity Inpatient Short Form        How much help from another person does the patient currently need... Total A Lot A Little None    1.  Putting on and taking off regular lower body clothing?   []  1   []  2   [x]  3   []  4   2.  Bathing (including washing,  rinsing, drying)?   []  1   []  2   [x]  3   []  4   3.  Toileting, which includes using toilet, bedpan or urinal?   []  1   []  2   [x]  3   []  4   4.  Putting on and taking off regular upper body clothing?   []  1   []  2   []  3   [x]  4   5.  Taking care of personal grooming such as brushing teeth?   []  1   []  2   []  3   [x]  4   6.  Eating meals?   []  1   []  2   []  3   [x]  4    2007, Trustees of [address], under license to [place], [place]. All rights reserved     Score:  Initial: 21, completed, 05/09/2020 Most Recent: X (Date: -- )   Interpretation of Tool:  Represents activities that are increasingly more difficult (i.e. Bed mobility, Transfers, Gait).    PLAN:   FREQUENCY/DURATION: OT Plan of Care: 3 times/week for duration of hospital stay or until stated goals are met, whichever comes first.    PROBLEM LIST:   (Skilled intervention is medically necessary to address:)  1. Decreased ADL/Functional Activities  2. Decreased Activity Tolerance  3. Decreased Balance  4. Decreased Gait Ability  5. Decreased Transfer Abilities  6. Increased Pain   INTERVENTIONS PLANNED:   (Benefits and precautions of occupational therapy have been discussed with the patient.)  1. Self Care Training  2. Therapeutic Activity  3. Therapeutic Exercise/HEP  4. Neuromuscular Re-education  5. Education     TREATMENT:     EVALUATION: Low Complexity : (Untimed Charge)    TREATMENT:   Self Care (15 Minutes): Self care including Lower Body Dressing and Grooming to increase independence and decrease level of assistance required.    TREATMENT GRID:  N/A    AFTER TREATMENT POSITION/PRECAUTIONS:  Chair, Needs within reach, RN notified and table tray anterior to pt.    INTERDISCIPLINARY COLLABORATION:  RN/PCT and OT/COTA    TOTAL TREATMENT DURATION:  OT Patient Time In/Time Out  Time In: 0908  Time Out: 0935    Artemis Floros, OTR/L

## 2020-05-09 NOTE — Progress Notes (Signed)
END OF SHIFT NOTE:    INTAKE/OUTPUT  11/02 0701 - 11/03 0700  In: 1743.1 [P.O.:1040; I.V.:703.1]  Out: 3367 [Urine:3100]  Voiding: YES  Catheter: NO  Drain:              Flatus: Patient does have flatus present.    Stool:  0 occurrences.    Characteristics:       Emesis: 0 occurrences.    Characteristics:        VITAL SIGNS  Patient Vitals for the past 12 hrs:   Temp Pulse Resp BP SpO2   05/09/20 0320 98.9 F (37.2 C) 66 18 (!) 119/58 94 %   05/09/20 0200 -- -- -- -- 91 %   05/08/20 2337 98.8 F (37.1 C) 98 18 108/67 92 %   05/08/20 1936 -- -- -- -- 91 %   05/08/20 1916 98.3 F (36.8 C) 83 19 136/70 91 %       Pain Assessment  Pain Intensity 1: 0 (05/09/20 0214)  Pain Location 1: Flank, Shoulder, Abdomen  Pain Intervention(s) 1: Medication (see MAR)  Patient Stated Pain Goal: 0    Ambulating  Yes    Shift report given to oncoming nurse at the bedside.    Estevan Ryder, RN

## 2020-05-09 NOTE — Progress Notes (Signed)
Chest tube dressings changed. Large amount of serous drainage from where posterior tube had been previously had soaked through the dressing and required a total bed change. Petroleum gauze, 4x4, ABD pad and tape replaced. Anterior chest tube still intact. Left flank upper incision with staples left OTA and intact with no drainage noted.

## 2020-05-09 NOTE — Progress Notes (Signed)
CM met with pt and spouse this day to discuss discharge planning.  Pt plans on returning home with spouse when medically stable for discharge.  Pt confirms she does not wear oxygen at home.  Currently attempting to wean off oxygen but CM to continue to monitor need and arrange if needed at discharge.  No additional needs voiced by pt/spouse at discharge.  Therapy now indicating no further services needed.      Will continue to follow and update as needed.

## 2020-05-10 ENCOUNTER — Inpatient Hospital Stay: Admit: 2020-05-10 | Payer: MEDICARE | Primary: Internal Medicine

## 2020-05-10 LAB — CBC WITH AUTO DIFFERENTIAL
Basophils %: 1 % (ref 0.0–2.0)
Basophils Absolute: 0 10*3/uL (ref 0.0–0.2)
Eosinophils %: 3 % (ref 0.5–7.8)
Eosinophils Absolute: 0.2 10*3/uL (ref 0.0–0.8)
Granulocyte Absolute Count: 0 10*3/uL (ref 0.0–0.5)
Hematocrit: 32.6 % — ABNORMAL LOW (ref 35.8–46.3)
Hemoglobin: 10.3 g/dL — ABNORMAL LOW (ref 11.7–15.4)
Immature Granulocytes: 0 % (ref 0.0–5.0)
Lymphocytes %: 20 % (ref 13–44)
Lymphocytes Absolute: 1.6 10*3/uL (ref 0.5–4.6)
MCH: 30.5 PG (ref 26.1–32.9)
MCHC: 31.6 g/dL (ref 31.4–35.0)
MCV: 96.4 FL (ref 79.6–97.8)
MPV: 9.8 FL (ref 9.4–12.3)
Monocytes %: 10 % (ref 4.0–12.0)
Monocytes Absolute: 0.8 10*3/uL (ref 0.1–1.3)
NRBC Absolute: 0 10*3/uL (ref 0.0–0.2)
Neutrophils %: 67 % (ref 43–78)
Neutrophils Absolute: 5.4 10*3/uL (ref 1.7–8.2)
Platelets: 251 10*3/uL (ref 150–450)
RBC: 3.38 M/uL — ABNORMAL LOW (ref 4.05–5.2)
RDW: 14.3 % (ref 11.9–14.6)
WBC: 8.1 10*3/uL (ref 4.3–11.1)

## 2020-05-10 LAB — BASIC METABOLIC PANEL
Anion Gap: 2 mmol/L — ABNORMAL LOW (ref 7–16)
BUN: 8 MG/DL (ref 8–23)
CO2: 32 mmol/L (ref 21–32)
Calcium: 8.5 MG/DL (ref 8.3–10.4)
Chloride: 105 mmol/L (ref 98–107)
Creatinine: 0.59 MG/DL — ABNORMAL LOW (ref 0.6–1.0)
EGFR IF NonAfrican American: >60 mL/min/{1.73_m2} (ref >60)
GFR African American: >60 mL/min/{1.73_m2} (ref >60)
Glucose: 123 mg/dL — ABNORMAL HIGH (ref 65–100)
Potassium: 4 mmol/L (ref 3.5–5.1)
Sodium: 139 mmol/L (ref 136–145)

## 2020-05-10 LAB — CBC WITH AUTOMATED DIFF
ABS. BASOPHILS: 0 10*3/uL (ref 0.0–0.2)
ABS. EOSINOPHILS: 0.2 10*3/uL (ref 0.0–0.8)
ABS. IMM. GRANS.: 0 10*3/uL (ref 0.0–0.5)
ABS. LYMPHOCYTES: 1.6 10*3/uL (ref 0.5–4.6)
ABS. MONOCYTES: 0.8 10*3/uL (ref 0.1–1.3)
ABS. NEUTROPHILS: 5.4 10*3/uL (ref 1.7–8.2)
ABSOLUTE NRBC: 0 10*3/uL (ref 0.0–0.2)
BASOPHILS: 1 % (ref 0.0–2.0)
EOSINOPHILS: 3 % (ref 0.5–7.8)
HCT: 32.6 % — ABNORMAL LOW (ref 35.8–46.3)
HGB: 10.3 g/dL — ABNORMAL LOW (ref 11.7–15.4)
IMMATURE GRANULOCYTES: 0 % (ref 0.0–5.0)
LYMPHOCYTES: 20 % (ref 13–44)
MCH: 30.5 PG (ref 26.1–32.9)
MCHC: 31.6 g/dL (ref 31.4–35.0)
MCV: 96.4 FL (ref 79.6–97.8)
MONOCYTES: 10 % (ref 4.0–12.0)
MPV: 9.8 FL (ref 9.4–12.3)
NEUTROPHILS: 67 % (ref 43–78)
PLATELET: 251 10*3/uL (ref 150–450)
RBC: 3.38 M/uL — ABNORMAL LOW (ref 4.05–5.2)
RDW: 14.3 % (ref 11.9–14.6)
WBC: 8.1 10*3/uL (ref 4.3–11.1)

## 2020-05-10 LAB — METABOLIC PANEL, BASIC
Anion gap: 2 mmol/L — ABNORMAL LOW (ref 7–16)
BUN: 8 MG/DL (ref 8–23)
CO2: 32 mmol/L (ref 21–32)
Calcium: 8.5 MG/DL (ref 8.3–10.4)
Chloride: 105 mmol/L (ref 98–107)
Creatinine: 0.59 MG/DL — ABNORMAL LOW (ref 0.6–1.0)
GFR est AA: >60 mL/min/{1.73_m2} (ref >60)
GFR est non-AA: >60 mL/min/{1.73_m2} (ref >60)
Glucose: 123 mg/dL — ABNORMAL HIGH (ref 65–100)
Potassium: 4 mmol/L (ref 3.5–5.1)
Sodium: 139 mmol/L (ref 136–145)

## 2020-05-10 MED ORDER — GUAIFENESIN ER 1,200 MG TABLET,EXTENDED RELEASE 12HR MPHASE
1200 mg | ORAL_TABLET | Freq: Two times a day (BID) | ORAL | 0 refills | Status: AC
Start: 2020-05-10 — End: 2020-05-17

## 2020-05-10 MED ORDER — MAGNESIUM HYDROXIDE 400 MG/5 ML ORAL SUSP
400 mg/5 mL | ORAL | Status: AC
Start: 2020-05-10 — End: 2020-05-09
  Administered 2020-05-10: 01:00:00 via ORAL

## 2020-05-10 MED ORDER — ENOXAPARIN 40 MG/0.4 ML SUB-Q SYRINGE
40 mg/0.4 mL | Freq: Every day | SUBCUTANEOUS | 0 refills | Status: AC
Start: 2020-05-10 — End: 2020-06-01

## 2020-05-10 MED ORDER — OXYCODONE 5 MG TAB
5 mg | ORAL_TABLET | Freq: Four times a day (QID) | ORAL | 0 refills | Status: AC | PRN
Start: 2020-05-10 — End: 2020-05-15

## 2020-05-10 MED FILL — PANTOPRAZOLE 40 MG TAB, DELAYED RELEASE: 40 mg | ORAL | Qty: 1

## 2020-05-10 MED FILL — IPRATROPIUM-ALBUTEROL 2.5 MG-0.5 MG/3 ML NEB SOLUTION: 2.5 mg-0.5 mg/3 ml | RESPIRATORY_TRACT | Qty: 3

## 2020-05-10 MED FILL — GABAPENTIN 300 MG CAP: 300 mg | ORAL | Qty: 1

## 2020-05-10 MED FILL — HYDROMORPHONE (PF) 1 MG/ML IJ SOLN: 1 mg/mL | INTRAMUSCULAR | Qty: 1

## 2020-05-10 MED FILL — OXYCODONE 5 MG TAB: 5 mg | ORAL | Qty: 1

## 2020-05-10 MED FILL — LOVENOX 40 MG/0.4 ML SUBCUTANEOUS SYRINGE: 40 mg/0.4 mL | SUBCUTANEOUS | Qty: 0.4

## 2020-05-10 MED FILL — OXYCODONE 5 MG TAB: 5 mg | ORAL | Qty: 2

## 2020-05-10 MED FILL — MAGNESIUM HYDROXIDE 400 MG/5 ML ORAL SUSP: 400 mg/5 mL | ORAL | Qty: 60

## 2020-05-10 MED FILL — MUCUS RELIEF ER 600 MG TABLET, EXTENDED RELEASE: 600 mg | ORAL | Qty: 2

## 2020-05-10 MED FILL — ATORVASTATIN 20 MG TAB: 20 mg | ORAL | Qty: 1

## 2020-05-10 MED FILL — STOOL SOFTENER-STIMULANT LAXATIVE 8.6 MG-50 MG TABLET: ORAL | Qty: 2

## 2020-05-10 MED FILL — LEVOTHYROXINE 100 MCG TAB: 100 mcg | ORAL | Qty: 1

## 2020-05-10 MED FILL — METOPROLOL SUCCINATE SR 50 MG 24 HR TAB: 50 mg | ORAL | Qty: 1

## 2020-05-10 NOTE — Progress Notes (Signed)
Evaluation for Home Oxygen    Resting (Room Air)  SpO2: 89    Resting (On O2)  SpO2:  93  O2 Device: Nasal cannula  O2 Flow Rate (L/min): 3 l/min    During Walk (Room Air)  SpO2: 78  Walk/Assistance Device: Ambulation  Rate of Dyspnea: Increased RR 20-27  Symptoms: Shortness of breath  Comments:      During Walk (On O2)  SpO2: 94  HR:    O2 Device: Nasal cannula  O2 Flow Rate (L/min): 6 l/min  Walk/Assistance Device: Ambulation  Rate of Dyspnea: Increased RR 20-27  Symptoms: Shortness of breath  Comments: 3p/m walking 84%, 4Lp/m walking 87%, 5Lp/m walking 90%, 6Lp/m walking 94%    After Walk  SpO2: 94  HR:    O2 Device: Nasal cannula  O2 Flow Rate (L/min): 3 l/min  FIO2 (%):    Rate of Dyspnea: Increased RR 20-27    Electronically signed by Kerrie Buffalo, RT on 05/10/2020 at 11:35 AM

## 2020-05-10 NOTE — Progress Notes (Signed)
Pt with discharge orders this day.  Pt with need for home oxygen at discharge.  Tank delivered to room and referral made to [facility] this day.  Pt declines need for home health at discharge upon conversation at bedside this day.  No additional CM needs at discharge.  Milestones met.    Care Management Interventions  PCP Verified by CM: Yes  Mode of Transport at Discharge: Other (see comment) (family)  Transition of Care Consult (CM Consult): Discharge Planning  MyChart Signup:  (Home oxygen - [facility])  Discharge Durable Medical Equipment: Yes  Physical Therapy Consult: Yes  Occupational Therapy Consult: Yes  Speech Therapy Consult: No  Support Systems: Spouse/Significant Other, Child(ren)  Confirm Follow Up Transport: Family  The Plan for Transition of Care is Related to the Following Treatment Goals : home  The Patient and/or Patient Representative was Provided with a Choice of Provider and Agrees with the Discharge Plan?: Yes  Name of the Patient Representative Who was Provided with a Choice of Provider and Agrees with the Discharge Plan: Pt and spouse  Freedom of Choice List was Provided with Basic Dialogue that Supports the Patient's Individualized Plan of Care/Goals, Treatment Preferences and Shares the Quality Data Associated with the Providers?: Yes  Veteran Resource Information Provided?: No  Discharge Location  Discharge Placement: Home

## 2020-05-10 NOTE — Progress Notes (Signed)
END OF SHIFT NOTE:    INTAKE/OUTPUT  11/03 0701 - 11/04 0700  In: 2000 [P.O.:2000]  Out: 1771 [Urine:1750]  Voiding: YES  Catheter: NO  Drain:              Flatus: Patient does have flatus present.    Stool:  0 occurrences.    Characteristics:       Emesis: 0 occurrences.    Characteristics:        VITAL SIGNS  Patient Vitals for the past 12 hrs:   Temp Pulse Resp BP SpO2   05/10/20 0310 98.3 F (36.8 C) 67 18 (!) 146/84 94 %   05/09/20 2253 98 F (36.7 C) 63 18 (!) 127/91 92 %   05/09/20 2010 -- -- -- -- 96 %   05/09/20 1912 98.2 F (36.8 C) (!) 50 18 133/69 93 %       Pain Assessment  Pain Intensity 1: 0 (05/10/20 0125)  Pain Location 1: Flank  Pain Intervention(s) 1: Medication (see MAR)  Patient Stated Pain Goal: 0    Ambulating  Yes    Shift report given to oncoming nurse at the bedside.    Estevan Ryder, RN

## 2020-05-10 NOTE — Progress Notes (Signed)
Evaluation for Home Oxygen    Resting (Room Air)  SpO2: 89    Resting (On O2)  SpO2:  93  O2 Device: Nasal cannula  O2 Flow Rate (L/min): 3 l/min      During Walk (On O2)  SpO2: 94  O2 Device: Nasal cannula  O2 Flow Rate (L/min): 6 l/min  Walk/Assistance Device: Ambulation  Rate of Dyspnea: Increased RR 20-27  Symptoms: Shortness of breath  Comments: 3p/m walking 84%, 4Lp/m walking 87%, 5Lp/m walking 90%, 6Lp/m walking 94%    After Walk  SpO2: 94  HR:    O2 Device: Nasal cannula  O2 Flow Rate (L/min): 3 l/min  FIO2 (%):    Rate of Dyspnea: Increased RR 20-27    Electronically signed by Neldon Labella, RT on 05/10/2020 at 11:08 AM

## 2020-05-10 NOTE — Discharge Summary (Signed)
MILIKA VENTRESS  MRN: [id number]     DOB: [birth year]     Age: 79 y.o.                          Admit date: 05/07/2020     Discharge date: 05/10/2020   Attending Physician: Dr. 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  NYALA KIRCHNER is a 79 y.o. female who is back to discuss biopsy result and surgery. She had had percutaneous biopsy, which confirmed right lung cancer. PET shows no metastatic disease. Her PFT is reasonable to allow surgery.   ??  On 03/20/2020, she was referred by Clista Bernhardt NP??for evaluation of a lung mass involving the Right lower lobe.??This is suspicious appearing 5cm mass. ??She has not had any further workup with exception to the CT scan. ??This appears to be incidental finding during the workup for a near syncope episode a few weeks ago. She has some front chest pain, and shortness of breath.??She has been worked up by cardiology for arrhythmia.??  ??  ??She????does??have a history of smoking remotely, quit 40 years ago.??She??does not??have a history of drinking. She is very hard hearing. Other medical issues are reviewed as below. She had right??thoracoscopy??to remove a mass in chest years ago, she believes it was benign. She??does not??have a family history of cancer. ????does not??take blood thinner.          Hospital Course:  Uneventful for this operation. Chest tubes removed on post operative day 2 and day 3. Discharged home is stable condition.  The patient progressed satisfactorily meeting milestones necessary for successful discharge including tolerating a diet, adequate  mobility, adequate pain control, and active bowel function. Patient was deemed a good candidate for discharge at the time of morning rounding. They are to follow up as indicated in their provided discharge paperwork. The patient helped develop and voices understanding with the plan of care. They are amenable without reservations at this time to moving forward with discharge.       Condition at Discharge: good    Discharge Medications:   Current Discharge Medication List      START taking these medications    Details   enoxaparin (LOVENOX) 40 mg/0.4 mL 0.4 mL by SubCUTAneous route daily for 21 days. Indications: deep vein thrombosis prevention  Qty: 8.4 mL, Refills: 0      guaiFENesin ER (MUCINEX) 1,200 mg Ta12 ER tablet Take 1 Tablet by mouth every twelve (12) hours for 7 days. Indications: cough  Qty: 14 Tablet, Refills: 0      oxyCODONE IR (ROXICODONE) 5 mg immediate release tablet Take 1 Tablet by mouth every six (6) hours as needed (Pain Moderate (4-6)) for up to 5 days. Max Daily Amount: 20 mg.  Qty: 20 Tablet, Refills: 0    Associated Diagnoses: Bronchogenic cancer of right lung ([place])         CONTINUE these medications which have NOT CHANGED    Details   umeclidinium-vilanteroL (Anoro Ellipta) 62.5-25  mcg/actuation inhaler Take 1 Puff by inhalation daily.  Qty: 1 Each, Refills: 11      metoprolol succinate (TOPROL-XL) 50 mg XL tablet Take 50 mg by mouth daily.      atorvastatin (LIPITOR) 20 mg tablet Take 20 mg by mouth daily.      ergocalciferol (ERGOCALCIFEROL) 1,250 mcg (50,000 unit) capsule Take 50,000 Units by mouth every seven (7) days.      mupirocin (BACTROBAN) 2 % ointment Apply  to affected area daily.  Qty: 15 g, Refills: 1      levothyroxine (SYNTHROID) 100 mcg tablet Take 1 Tablet by mouth Daily (before breakfast).  Qty: 30 Tablet, Refills: 11      gabapentin (NEURONTIN) 100 mg capsule Take 100 mg by mouth three (3) times daily as needed.      albuterol (PROVENTIL HFA, VENTOLIN HFA, PROAIR HFA) 90  mcg/actuation inhaler Take 1 Puff by inhalation every six (6) hours as needed for Wheezing.  Qty: 1 Inhaler, Refills: 5      losartan (COZAAR) 100 mg tablet TAKE 1 TABLET BY MOUTH DAILY  Qty: 90 Tab, Refills: 3      fluticasone propionate (FLONASE) 50 mcg/actuation nasal spray 2 Sprays by Both Nostrils route daily.  Qty: 1 Bottle, Refills: 5      denosumab (PROLIA) 60 mg/mL injection 60 mg by SubCUTAneous route every 6 months. Due December 2021      Cholecalciferol, Vitamin D3, 50,000 unit cap Take 1 Capsule by mouth every Monday.      methotrexate, PF, 25 mg/mL injection 25 mg every seven (7) days. Friday evening      nicotinic acid (NIACIN) 500 mg tablet Take 500 mg by mouth Daily (before breakfast). 2 po everyday.       calcium-cholecalciferol, d3, (CALCIUM 600 + D) 600-125 mg-unit tab Take 2 Tabs by mouth.      multivitamin (ONE A DAY) tablet Take 1 Tab by mouth daily.      folic acid (FOLVITE) 1 mg tablet Take  by mouth daily.      bacitracin-neomycin-polymyxin b-hydrocortisone (CORTISPORIN) 1 % ointment Use samll amount 3-4 times a day.  Qty: 15 g, Refills: 5               Disposition/Discharge Instructions/Follow-up Care:      Follow-up with Dr. Sheria Lang.  Keep incisions clean and dry, leave dressing for 72 hours then OK to remove and leave off and then OK to shower.  Do not apply lotions, creams or ointments to incisions.     Diet - as tolerated  Activity - ambulate - as tolerated - no heavy lifting >20lb.  May shower - no tub baths or soaking/submerging.     No driving while taking narcotics.  Do not drink alcohol while taking narcotics.    Resume other home medications.  Take Rx as prescribed - Lovenox as ordered to prevent DVT  Take stool softeners while on narcotics to avoid constipation    Home oxygen as needed to keep oxygen saturation >/= 88%     If problems or questions arise, please call our office at [phone].     Greater than 30 minutes were spent discharging the patient          Signed:  Jake Shark, NP  05/10/2020  9:51 AM

## 2020-05-10 NOTE — Progress Notes (Signed)
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     ???? ?? A: ??"CHEST WALL MASS CONTENTS":   ???? ?? NECROTIC DEBRIS WITH OUT IDENTIFIABLE VIABLE MALIGNANT TUMOR   ???? ??   B: ??"NUMBER 8 LYMPH NODE X2":   ???? ?? ANTHRACOTIC LYMPH NODE NEGATIVE FOR METASTATIC TUMOR   ???? ??   C: ??"NUMBER 7 LYMPH NODE X4":   ???? ?? FOUR FRAGMENTS OF ANTHRACOTIC LYMPH NODE NEGATIVE FOR METASTATIC   TUMOR   ???? ??   D: ??"INTERLOBAR LYMPH NODE":   ???? ?? FOUR FRAGMENTS OF ANTHRACOTIC LYMPH NODE NEGATIVE FOR METASTATIC   TUMOR   ???? ??   E: ??"RIGHT LOWER LOBE OF LUNG":   ???? ?? POORLY DIFFERENTIATED MALIGNANT TUMOR CONSISTENT WITH POORLY   DIFFERENTIATED ADENOCARCINOMA MEASURING AP PROXIMALLY ??6.4 X 5.2 X 4.6 CM   WITH EXTENSIVE NECROSIS. BRONCHIAL AND VASCULAR MARGINS OF RESECTION ARE   NEGATIVE FOR MALIGNANT TUMOR. FOUR ANTHRACOTIC PERIBRONCHIAL LYMPH NODES   NEGATIVE FOR METASTATIC TUMOR. SPECIMEN MARKED "CHEST WALL MASS" INVOLVED   BY POORLY DIFFERENTIATED MALIGNANT TUMOR SIMILAR TO THAT WITHIN LUNG. SEE   COMMENT     SUBJECTIVE:  05/07/20 1 Day Post-Op awake in bed, no complaints. Pain controlled. AF, VSS. Chest tubes with out air leak. 2 L oxygen  05/09/20 2 days post op. Some complaints of mild incision pain. Chest tubes without air leak. 3L NC  05/10/20 3 days post operative. Awake in bed eating breakfast, no complaints. Pain controlled. Staples without redness or drainage. Chest tube without air leak.    Intake/Output Summary  (Last 24 hours) at 05/10/2020 2297  Last data filed at 05/10/2020 0748  Gross per 24 hour   Intake 2000 ml   Output 2371 ml   Net -371 ml     OBJECTIVE:  Constitutional: Alert oriented cooperative patient in no acute distress; appears stated age   Visit Vitals  BP (!) 169/68 Comment: RN Ezekiel Slocumb notified   Pulse 70   Temp 97.5 ??F (36.4 ??C)   Resp 14   Ht 5\' 10"  (1.778 m)   Wt 169 lb 9.6 oz (76.9 kg)   SpO2 97%   Breastfeeding No   BMI 24.34 kg/m??     Eyes:Sclera are clear.   ENMT: no external lesions, HOH; no obvious neck masses, no ear or lip lesions  CV: RRR. Normal perfusion  Resp: No JVD.  Breathing is  non-labored; no audible wheezing.  Chest tube  to water seal without air leak noted  GI: soft and non-distended     Musculoskeletal: unremarkable with normal function. No embolic signs or cyanosis.   Neuro:  Oriented; moves all 4; no focal deficits  Psychiatric: normal affect and mood, no memory impairment      Patient Vitals for the  past 24 hrs:   BP Temp Pulse Resp SpO2   05/10/20 0751 ??? ??? ??? ??? 97 %   05/10/20 0748 (!) 169/68 97.5 ??F (36.4 ??C) 70 14 97 %   05/10/20 0310 (!) 146/84 98.3 ??F (36.8 ??C) 67 18 94 %   05/09/20 2253 (!) 127/91 98 ??F (36.7 ??C) 63 18 92 %   05/09/20 2010 ??? ??? ??? ??? 96 %   05/09/20 1912 133/69 98.2 ??F (36.8 ??C) (!) 50 18 93 %   05/09/20 1456 117/61 98.1 ??F (36.7 ??C) 62 16 96 %   05/09/20 1155 129/72 97.8 ??F (36.6 ??C) 85 15 92 %   05/09/20 1015 ??? ??? ??? ??? 94 %   05/09/20 0908 ??? ??? ??? ??? 93 %     Labs:    Recent Labs     05/10/20  0517   WBC 8.1   HGB 10.3*   PLT 251   NA 139   K 4.0   CL 105   CO2 32   BUN 8   CREA 0.59*   GLU 123*     XR Results (most recent):  Results from Hospital Encounter encounter on 05/07/20    XR CHEST SNGL V    Narrative  EXAMINATION: One view chest    HISTORY: post op lobectomy    TECHNIQUE: Frontal chest.    COMPARISON: 05/09/2020    FINDINGS:  Stable right chest tube.    Persistent mild bibasilar lung infiltrates.    There may be a small right apical pneumothorax.    The  right cardiophrenic angle is obscured by overlying leads.    There is no pleural effusion. The heart is unchanged.    No other significant interval changes.    Impression  1. Findings as described above.            Signed:  Jake Shark, NP

## 2020-05-10 NOTE — Progress Notes (Signed)
Problem: Falls - Risk of  Goal: *Absence of Falls  Description: Document Audrey Snow Fall Risk and appropriate interventions in the flowsheet.  Outcome: Progressing Towards Goal  Note: Fall Risk Interventions:  Mobility Interventions: Communicate number of staff needed for ambulation/transfer, Bed/chair exit alarm, Patient to call before getting OOB    Mentation Interventions: Adequate sleep, hydration, pain control, Bed/chair exit alarm    Medication Interventions: Patient to call before getting OOB, Teach patient to arise slowly, Bed/chair exit alarm    Elimination Interventions: Call light in reach, Bed/chair exit alarm, Patient to call for help with toileting needs              Problem: Falls - Risk of  Goal: *Absence of Falls  Description: Document Audrey Snow Fall Risk and appropriate interventions in the flowsheet.  Outcome: Progressing Towards Goal  Note: Fall Risk Interventions:  Mobility Interventions: Communicate number of staff needed for ambulation/transfer, Bed/chair exit alarm, Patient to call before getting OOB    Mentation Interventions: Adequate sleep, hydration, pain control, Bed/chair exit alarm    Medication Interventions: Patient to call before getting OOB, Teach patient to arise slowly, Bed/chair exit alarm    Elimination Interventions: Call light in reach, Bed/chair exit alarm, Patient to call for help with toileting needs              Problem: Pressure Injury - Risk of  Goal: *Prevention of pressure injury  Description: Document Braden Scale and appropriate interventions in the flowsheet.  Outcome: Progressing Towards Goal  Note: Pressure Injury Interventions:       Moisture Interventions: Contain wound drainage    Activity Interventions: Increase time out of bed    Mobility Interventions: HOB 30 degrees or less, Pressure redistribution bed/mattress (bed type)    Nutrition Interventions: Document food/fluid/supplement intake, Offer support with meals,snacks and hydration                     Problem:  Pain  Goal: *Control of Pain  Outcome: Progressing Towards Goal     Problem: Infection - Risk of, Urinary Catheter-Associated Urinary Tract Infection  Goal: *Absence of infection signs and symptoms  Outcome: Progressing Towards Goal     Problem: Patient Education: Go to Patient Education Activity  Goal: Patient/Family Education  Outcome: Progressing Towards Goal     Problem: Patient Education: Go to Patient Education Activity  Goal: Patient/Family Education  Outcome: Progressing Towards Goal

## 2020-05-10 NOTE — Progress Notes (Signed)
Pt's D/C instructions completed.  Verbalized understanding of all instructions including diet, activity, s/sx to alert MD, medications, injection administration, wound care, and f/u appointment.  Family at [facility].

## 2020-05-16 LAB — FUNGUS, CULTURE, MISC SOURCE
FUNGUS CULTURE: NEGATIVE
Fungus culture: NEGATIVE

## 2020-05-16 LAB — FUNGUS CULTURE AND SMEAR

## 2020-05-22 ENCOUNTER — Ambulatory Visit: Attending: Surgery | Primary: Internal Medicine

## 2020-05-22 ENCOUNTER — Ambulatory Visit: Admit: 2020-05-22 | Discharge: 2020-05-22 | Payer: MEDICARE | Attending: Surgery | Primary: Internal Medicine

## 2020-05-22 DIAGNOSIS — C3491 Malignant neoplasm of unspecified part of right bronchus or lung: Secondary | ICD-10-CM

## 2020-05-22 NOTE — Progress Notes (Signed)
[facility]  [address], [address]  [place] [address]  [phone]      SUBJECTIVE: Audrey Snow is a 79 y.o. female is seen for a routine postop check. She had right VATS lower lobectomy to remove a large cancer and en bloc resection of a chest wall mass. The chest wall mass is close but not direct extension from primary cancer. Surgery went well and she had uneventful recovery. Today, she reports no problems with the wound.  Activity, diet and bowels are normal. Some incisional pain. Still on 1 liter oxygen.     OBJECTIVE: Appears well. Wound is well healed without complications or infection.    PATH:  A: ??"CHEST WALL MASS CONTENTS":   ???? ?? NECROTIC DEBRIS WITH OUT IDENTIFIABLE VIABLE MALIGNANT TUMOR   ???? ??   B: ??"NUMBER 8 LYMPH NODE X2":   ???? ?? ANTHRACOTIC LYMPH NODE NEGATIVE FOR METASTATIC TUMOR   ???? ??   C: ??"NUMBER 7 LYMPH NODE X4":   ???? ?? FOUR FRAGMENTS OF ANTHRACOTIC LYMPH NODE NEGATIVE FOR METASTATIC   TUMOR   ???? ??   D: ??"INTERLOBAR LYMPH NODE":   ???? ?? FOUR FRAGMENTS OF ANTHRACOTIC LYMPH NODE NEGATIVE FOR METASTATIC   TUMOR   ???? ??   E: ??"RIGHT LOWER LOBE OF LUNG":   ???? ?? POORLY DIFFERENTIATED MALIGNANT TUMOR CONSISTENT WITH POORLY   DIFFERENTIATED ADENOCARCINOMA MEASURING AP PROXIMALLY ??6.4 X 5.2 X 4.6 CM   WITH EXTENSIVE NECROSIS. BRONCHIAL AND VASCULAR MARGINS OF RESECTION ARE   NEGATIVE FOR MALIGNANT TUMOR. FOUR ANTHRACOTIC PERIBRONCHIAL LYMPH NODES   NEGATIVE FOR METASTATIC TUMOR. SPECIMEN MARKED "CHEST WALL MASS" INVOLVED   BY POORLY DIFFERENTIATED MALIGNANT TUMOR SIMILAR TO THAT WITHIN LUNG.    ASSESSMENT: normal postoperative course, doing well. 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 Place port. Discuss on pulmonary board.     Thayer Jew, MD

## 2020-05-30 LAB — AFB CULTURE + SMEAR W/RFLX ID FROM CULTURE
ACID FAST CULTURE: NEGATIVE
ACID FAST SMEAR: NEGATIVE
Acid Fast Culture: NEGATIVE
Acid Fast Smear: NEGATIVE

## 2020-06-06 ENCOUNTER — Encounter: Payer: MEDICARE | Attending: Internal Medicine | Primary: Internal Medicine

## 2020-06-07 ENCOUNTER — Ambulatory Visit: Attending: Internal Medicine | Primary: Internal Medicine

## 2020-06-07 ENCOUNTER — Ambulatory Visit: Admit: 2020-06-07 | Discharge: 2020-06-07 | Payer: MEDICARE | Attending: Internal Medicine | Primary: Internal Medicine

## 2020-06-07 DIAGNOSIS — Z0000 Encounter for general adult medical examination without abnormal findings: Secondary | ICD-10-CM

## 2020-06-07 NOTE — Progress Notes (Signed)
Pt notified of lab results. She has actually missed some doses of Levothyroxine medication, as she forgot to put them in her pill planner. She will continue the Levothyroxine 100 mcg and recheck TSH in 8 weeks. A copy of labs mailed to pt.

## 2020-06-07 NOTE — Progress Notes (Signed)
Labs are stable except mild increase in blood sugar so watch sweets in diet  Also thyroid medication needs adjustment  Increase to 161mcg daily #90 with 1 RF  Notify pt and copy to pt

## 2020-06-07 NOTE — Progress Notes (Signed)
HPI: Audrey Snow (DOB: Jan 31, 1941)    MW    Has had resection of lung cancer on the right and has been having some pain along her right rib and into her right breast post op    Due to get her port on the 16th of Dec then start tx    Has decided she also wants to pursue getting hearing aides        Problem List:  Patient Active Problem List   Diagnosis Code   ??? Rheumatoid arthritis involving multiple sites with positive rheumatoid factor ([place]) M05.79   ??? Hypothyroidism E03.9   ??? Vitamin D deficiency E55.9   ??? Osteoporosis M81.0   ??? Gastroesophageal reflux disease without esophagitis K21.9   ??? HTN (hypertension) I10   ??? Environmental allergies Z91.09   ??? Chronic right-sided low back pain without sciatica M54.50, G89.29   ??? Prediabetes R73.03   ??? Chronic pain of left knee M25.562, G89.29   ??? 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 Hyperlipidemia E78.5   ??? Chronic obstructive pulmonary disease (HCC) J44.9   ??? Dry skin dermatitis L85.3   ??? Bilateral leg cramps R25.2   ??? Elevated blood sugar R73.9   ??? Right lower lobe lung mass R91.8   ??? Bronchogenic cancer of right lung (HCC) C34.91   ??? 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 Arrhythmia 04/05/2020    PVCs.  04/05/20 was referred to [facility] for tachycardia, was seen 05/01/20 CE   ??? 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 Autoimmune disease ([place])     RA   ??? Bronchogenic cancer of right lung ([place]) 04/28/2020    adenocarcinoma   ??? Chronic obstructive pulmonary disease (HCC)     uses inhalers   ??? Chronic pain of left knee 06/25/2018   ??? Coronary atherosclerosis     noted on chest CT   ??? GERD (gastroesophageal reflux disease)    ??? H/O cardiovascular stress test     Bruce protocol, low risk study, EF of 67% and normal LV function   ??? H/O mammogram 07/28/2016    mammogram and Ultrasound of right breaset- benign finding- [facility]     ??? Hearing loss     "only hears out of left ear"   ??? Hearing loss of both ears    ??? HTN (hypertension), benign 01/30/2014    controlled w/med   ??? Hypothyroid     on med for control   ??? Menopause    ??? Osteoarthritis    ??? Osteoporosis    ??? 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 Debbra Riding. Methotrexate Rx   ??? Sinusitis    ??? 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 Nancie Neas   ??? Unspecified hypothyroidism 01/30/2014   ??? Vitamin D deficiency        Allergies:  Allergies   Allergen Reactions   ??? Yeast Nausea and Vomiting       Current Medications:  Current Outpatient Medications   Medication Sig Dispense Refill   ??? umeclidinium-vilanteroL (Anoro Ellipta) 62.5-25 mcg/actuation inhaler Take 1 Puff by inhalation daily. 1 Each 11   ??? metoprolol succinate (TOPROL-XL) 50 mg XL tablet Take 50 mg by mouth daily.     ??? atorvastatin (LIPITOR) 20 mg tablet Take  20 mg by mouth daily.     ??? ergocalciferol (ERGOCALCIFEROL) 1,250 mcg (50,000 unit) capsule Take 50,000 Units by mouth every seven (7) days.     ??? mupirocin (BACTROBAN) 2 % ointment Apply  to affected area daily. (Patient taking differently: Apply  to affected area as needed.) 15 g 1   ??? levothyroxine (SYNTHROID) 100 mcg tablet Take 1 Tablet by mouth Daily (before breakfast). 30 Tablet 11   ??? gabapentin (NEURONTIN) 100 mg capsule Take 100 mg by mouth three (3) times daily as needed.     ??? albuterol (PROVENTIL HFA, VENTOLIN HFA, PROAIR HFA) 90 mcg/actuation inhaler Take 1 Puff by inhalation every six (6) hours as needed for Wheezing. 1 Inhaler 5   ??? losartan (COZAAR) 100 mg tablet TAKE 1 TABLET BY MOUTH DAILY 90 Tab 3   ??? fluticasone propionate (FLONASE) 50 mcg/actuation nasal spray 2 Sprays by Both Nostrils route daily. (Patient taking differently: 2 Sprays by Both Nostrils route daily as needed.) 1 Bottle 5   ??? bacitracin-neomycin-polymyxin b-hydrocortisone (CORTISPORIN) 1 % ointment Use samll amount 3-4 times a day. 15 g 5   ??? denosumab (PROLIA) 60  mg/mL injection 60 mg by SubCUTAneous route every 6 months. Due December 2021     ??? Cholecalciferol, Vitamin D3, 50,000 unit cap Take 1 Capsule by mouth every Monday.     ??? methotrexate, PF, 25 mg/mL injection 25 mg every seven (7) days. Friday evening     ??? nicotinic acid (NIACIN) 500 mg tablet Take 500 mg by mouth Daily (before breakfast). 2 po everyday.      ??? calcium-cholecalciferol, d3, (CALCIUM 600 + D) 600-125 mg-unit tab Take 2 Tabs by mouth.     ??? multivitamin (ONE A DAY) tablet Take 1 Tab by mouth daily.     ??? folic acid (FOLVITE) 1 mg tablet Take  by mouth daily.         Review of Systems:  Review of Systems   Constitutional: Positive for weight loss.   Respiratory: Negative for shortness of breath.    Cardiovascular:        Chest wall pain   Musculoskeletal: Negative for falls.   Psychiatric/Behavioral: Negative for depression and memory loss.   All other systems reviewed and are negative.      Vitals:  Visit Vitals  BP 130/70   Pulse 60   Temp 97 ??F (36.1 ??C)   Ht 5\' 10"  (1.778 m)   Wt 155 lb 3.2 oz (70.4 kg)   SpO2 94%   BMI 22.27 kg/m??       Physical Exam:  Physical Exam  Vitals reviewed.   Constitutional:       Appearance: Normal appearance.   HENT:      Head: Normocephalic and atraumatic.   Cardiovascular:      Rate and Rhythm: Normal rate and regular rhythm.      Heart sounds: Normal heart sounds.      Comments: With ectopy  Pulmonary:      Effort: Pulmonary effort is normal.      Breath sounds: Normal breath sounds.   Chest:   Breasts:      Right: Normal.       Musculoskeletal:         General: Tenderness present.      Cervical back: Normal range of motion and neck supple.      Comments: Right chest wall/rib tenderness to palpation  Near surgical scar   Skin:  General: Skin is warm and dry.      Findings: No rash.   Neurological:      General: No focal deficit present.      Mental Status: She is alert and oriented to person, place, and time.   Psychiatric:         Mood and Affect: Mood normal.          Behavior: Behavior normal.         Thought Content: Thought content normal.         Judgment: Judgment normal.         Assessment/Plan:   Diagnoses and all orders for this visit:    1. Encounter for Medicare annual wellness exam   needs aides- deciding where to go - possible [facility] or do ENT referral  Update labs   immun reviewed  2. Primary hypertension  -     CBC W/O DIFF  -     METABOLIC PANEL, COMPREHENSIVE  BP stable  3. Mixed hyperlipidemia  Watch diet  On Lipitor  4. Acquired hypothyroidism  -     TSH 3RD GENERATION    5. Rheumatoid arthritis involving multiple sites with positive rheumatoid factor (HCC)  -     CBC W/O DIFF  -     METABOLIC PANEL, COMPREHENSIVE    6. Prediabetes  -     HEMOGLOBIN A1C WITH EAG  Watch sweets in diet  7. Iron deficiency  -     IRON PROFILE  Anemic so make sure does not need iron replacment  8. Right-sided chest wall pain  Monitor for now as given location may be musculoskeletal and area of healing    9. Large cell carcinoma of lung, right ([place])  Due for port placement soon        Current medications are therapeutic at this time; continue as prescribed.      [name], MD      This is the Subsequent Medicare Annual Wellness Exam, performed 12 months or more after the Initial AWV or the last Subsequent AWV    I have reviewed the patient's medical history in detail and updated the computerized patient record.       Assessment/Plan   Education and counseling provided:  Are appropriate based on today's review and evaluation  Influenza Vaccine  Screening Mammography  Screening for glaucoma  Diabetes screening test    1. Encounter for Medicare annual wellness exam  2. Primary hypertension  -     CBC W/O DIFF  -     METABOLIC PANEL, COMPREHENSIVE  3. Mixed hyperlipidemia  4. Acquired hypothyroidism  -     TSH 3RD GENERATION  5. Rheumatoid arthritis involving multiple sites with positive rheumatoid factor (HCC)  -     CBC W/O DIFF  -     METABOLIC PANEL,  COMPREHENSIVE  6. Prediabetes  -     HEMOGLOBIN A1C WITH EAG  7. Iron deficiency  -     IRON PROFILE  8. Right-sided chest wall pain  9. Large cell carcinoma of lung, right (HCC)       Depression Risk Factor Screening     3 most recent PHQ Screens 06/07/2020   Little interest or pleasure in doing things Not at all   Feeling down, depressed, irritable, or hopeless Not at all   Total Score PHQ 2 0       Alcohol Risk Screen    Do you average more than 1  drink per night or more than 7 drinks a week:  No    On any one occasion in the past three months have you have had more than 3 drinks containing alcohol:  No        Functional Ability and Level of Safety    Hearing: The patient needs further evaluation.      Activities of Daily Living:  The home contains: handrails  Patient needs help with:  transportation      Ambulation: with no difficulty     Fall Risk:  Fall Risk Assessment, last 12 mths 06/07/2020   Able to walk? Yes   Fall in past 12 months? 0   Do you feel unsteady? 0   Is the gait abnormal? -   Number of falls in past 12 months -   Fall with injury? -      Abuse Screen:  Patient is not abused       Cognitive Screening    Has your family/caregiver stated any concerns about your memory: no     Cognitive Screening: Normal - word recall    Health Maintenance Due   There are no preventive care reminders to display for this patient.    Patient Care Team   Patient Care Team:  [name], MD as PCP - General (Internal Medicine)  [name], MD as PCP - [facility] Empaneled Provider    History     Patient Active Problem List   Diagnosis Code   ??? Rheumatoid arthritis involving multiple sites with positive rheumatoid factor ([place]) M05.79   ??? Hypothyroidism E03.9   ??? Vitamin D deficiency E55.9   ??? Osteoporosis M81.0   ??? Gastroesophageal reflux disease without esophagitis K21.9   ??? HTN (hypertension) I10   ??? Environmental allergies Z91.09   ??? Chronic right-sided low back pain without sciatica M54.50, G89.29   ???  Prediabetes R73.03   ??? Chronic pain of left knee M25.562, G89.29   ??? 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 Hyperlipidemia E78.5   ??? Chronic obstructive pulmonary disease (HCC) J44.9   ??? Dry skin dermatitis L85.3   ??? Bilateral leg cramps R25.2   ??? Elevated blood sugar R73.9   ??? Right lower lobe lung mass R91.8   ??? Bronchogenic cancer of right lung (HCC) C34.91   ??? 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 Arrhythmia 04/05/2020    PVCs.  04/05/20 was referred to CC for tachycardia, was seen 05/01/20 CE   ??? 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 Autoimmune disease ([place])     RA   ??? Bronchogenic cancer of right lung ([place]) 04/28/2020    adenocarcinoma   ??? Chronic obstructive pulmonary disease (HCC)     uses inhalers   ??? Chronic pain of left knee 06/25/2018   ??? Coronary atherosclerosis     noted on chest CT   ??? GERD (gastroesophageal reflux disease)    ??? H/O cardiovascular stress test     Bruce protocol, low risk study, EF of 67% and normal LV function   ??? H/O mammogram 07/28/2016    mammogram and Ultrasound of right breaset- benign finding- [facility]    ??? Hearing loss     "only hears out of left ear"   ??? Hearing loss of both ears    ???  HTN (hypertension), benign 01/30/2014    controlled w/med   ??? Hypothyroid     on med for control   ??? Menopause    ??? Osteoarthritis    ??? Osteoporosis    ??? 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 Debbra Riding. Methotrexate Rx   ??? Sinusitis    ??? 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 Nancie Neas   ??? Unspecified hypothyroidism 01/30/2014   ??? Vitamin D deficiency       Past Surgical History:   Procedure Laterality Date   ??? COLONOSCOPY N/A 01/27/2018    COLONOSCOPY/BMI 26 performed by Azalia Bilis, MD at [place]   ??? HX CARPAL TUNNEL RELEASE Bilateral    ??? HX COLONOSCOPY      colon polyps-adenomatous, has seen Dr. Kristopher Glee in past   ??? HX  ENDOSCOPY  2015    Gastric AVM txed with cautery-Dr. Orpah Melter   ??? HX HEMORRHOIDECTOMY     ??? HX OTHER SURGICAL  09/2012    sinus surgery-Dr. Mickle Mallory   ??? 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 HX TONSILLECTOMY  79 yrs old   ??? PR COLONOSCOPY FLX DX W/COLLJ SPEC WHEN PFRMD  01/27/2018          Current Outpatient Medications   Medication Sig Dispense Refill   ??? umeclidinium-vilanteroL (Anoro Ellipta) 62.5-25 mcg/actuation inhaler Take 1 Puff by inhalation daily. 1 Each 11   ??? metoprolol succinate (TOPROL-XL) 50 mg XL tablet Take 50 mg by mouth daily.     ??? atorvastatin (LIPITOR) 20 mg tablet Take 20 mg by mouth daily.     ??? ergocalciferol (ERGOCALCIFEROL) 1,250 mcg (50,000 unit) capsule Take 50,000 Units by mouth every seven (7) days.     ??? mupirocin (BACTROBAN) 2 % ointment Apply  to affected area daily. (Patient taking differently: Apply  to affected area as needed.) 15 g 1   ??? levothyroxine (SYNTHROID) 100 mcg tablet Take 1 Tablet by mouth Daily (before breakfast). 30 Tablet 11   ??? gabapentin (NEURONTIN) 100 mg capsule Take 100 mg by mouth three (3) times daily as needed.     ??? albuterol (PROVENTIL HFA, VENTOLIN HFA, PROAIR HFA) 90 mcg/actuation inhaler Take 1 Puff by inhalation every six (6) hours as needed for Wheezing. 1 Inhaler 5   ??? losartan (COZAAR) 100 mg tablet TAKE 1 TABLET BY MOUTH DAILY 90 Tab 3   ??? fluticasone propionate (FLONASE) 50 mcg/actuation nasal spray 2 Sprays by Both Nostrils route daily. (Patient taking differently: 2 Sprays by Both Nostrils route daily as needed.) 1 Bottle 5   ??? bacitracin-neomycin-polymyxin b-hydrocortisone (CORTISPORIN) 1 % ointment Use samll amount 3-4 times a day. 15 g 5   ??? denosumab (PROLIA) 60 mg/mL injection 60 mg by SubCUTAneous route every 6 months. Due December 2021     ??? Cholecalciferol, Vitamin D3, 50,000 unit cap Take 1 Capsule by mouth every Monday.     ??? methotrexate, PF, 25 mg/mL injection 25 mg every seven (7) days. Friday evening     ??? nicotinic  acid (NIACIN) 500 mg tablet Take 500 mg by mouth Daily (before breakfast). 2 po everyday.      ??? calcium-cholecalciferol, d3, (CALCIUM 600 + D) 600-125 mg-unit tab Take 2 Tabs by mouth.     ??? multivitamin (ONE A DAY) tablet Take 1 Tab by mouth daily.     ??? folic acid (FOLVITE) 1 mg tablet Take  by mouth  daily.       Allergies   Allergen Reactions   ??? Yeast Nausea and Vomiting       Family History   Problem Relation Age of Onset   ??? Arthritis-osteo Mother    ??? Alzheimer's Disease Father    ??? Breast Cancer Neg Hx      Social History     Tobacco Use   ??? Smoking status: Former Smoker     Packs/day: 0.50     Years: 43.00     Pack years: 21.50     Types: Cigarettes     Start date: 1961     Quit date: 1985     Years since quitting: 36.9   ??? Smokeless tobacco: Never Used   ??? Tobacco comment: not regular when first started- 1pk/week   Substance Use Topics   ??? Alcohol use: No     Alcohol/week: 0.0 standard drinks         [name], MD

## 2020-06-08 LAB — CBC
Hematocrit: 38.1 % (ref 34.0–46.6)
Hemoglobin: 13 g/dL (ref 11.1–15.9)
MCH: 31.8 pg (ref 26.6–33.0)
MCHC: 34.1 g/dL (ref 31.5–35.7)
MCV: 93 fL (ref 79–97)
Platelets: 275 10*3/uL (ref 150–450)
RBC: 4.09 x10E6/uL (ref 3.77–5.28)
RDW: 14.1 % (ref 11.7–15.4)
WBC: 6.5 10*3/uL (ref 3.4–10.8)

## 2020-06-08 LAB — COMPREHENSIVE METABOLIC PANEL
ALT: 15 IU/L (ref 0–32)
AST: 14 IU/L (ref 0–40)
Albumin/Globulin Ratio: 1.8 NA (ref 1.2–2.2)
Albumin: 4.2 g/dL (ref 3.7–4.7)
Alkaline Phosphatase: 64 IU/L (ref 44–121)
BUN: 14 mg/dL (ref 8–27)
Bun/Cre Ratio: 18 NA (ref 12–28)
CO2: 23 mmol/L (ref 20–29)
Calcium: 9.4 mg/dL (ref 8.7–10.3)
Chloride: 100 mmol/L (ref 96–106)
Creatinine: 0.78 mg/dL (ref 0.57–1.00)
EGFR IF NonAfrican American: 72 mL/min/{1.73_m2} (ref >59)
GFR African American: 83 mL/min/{1.73_m2} (ref >59)
Globulin, Total: 2.4 g/dL (ref 1.5–4.5)
Glucose: 95 mg/dL (ref 65–99)
Potassium: 4.4 mmol/L (ref 3.5–5.2)
Sodium: 140 mmol/L (ref 134–144)
Total Bilirubin: 0.6 mg/dL (ref 0.0–1.2)
Total Protein: 6.6 g/dL (ref 6.0–8.5)

## 2020-06-08 LAB — TSH 3RD GENERATION
TSH: 20.5 u[IU]/mL — ABNORMAL HIGH (ref 0.450–4.500)
TSH: 20.5 u[IU]/mL — ABNORMAL HIGH (ref 0.450–4.500)

## 2020-06-08 LAB — HEMOGLOBIN A1C W/EAG
Hemoglobin A1C: 6.1 % — ABNORMAL HIGH (ref 4.8–5.6)
eAG: 128 mg/dL

## 2020-06-08 LAB — IRON AND TIBC
Iron Saturation: 37 % (ref 15–55)
Iron: 122 ug/dL (ref 27–139)
TIBC: 331 ug/dL (ref 250–450)
UIBC: 209 ug/dL (ref 118–369)

## 2020-06-08 LAB — METABOLIC PANEL, COMPREHENSIVE
A-G Ratio: 1.8 (ref 1.2–2.2)
ALT (SGPT): 15 IU/L (ref 0–32)
AST (SGOT): 14 IU/L (ref 0–40)
Albumin: 4.2 g/dL (ref 3.7–4.7)
Alk. phosphatase: 64 IU/L (ref 44–121)
BUN/Creatinine ratio: 18 (ref 12–28)
BUN: 14 mg/dL (ref 8–27)
Bilirubin, total: 0.6 mg/dL (ref 0.0–1.2)
CO2: 23 mmol/L (ref 20–29)
Calcium: 9.4 mg/dL (ref 8.7–10.3)
Chloride: 100 mmol/L (ref 96–106)
Creatinine: 0.78 mg/dL (ref 0.57–1.00)
GFR est AA: 83 mL/min/{1.73_m2} (ref >59)
GFR est non-AA: 72 mL/min/{1.73_m2} (ref >59)
GLOBULIN, TOTAL: 2.4 g/dL (ref 1.5–4.5)
Glucose: 95 mg/dL (ref 65–99)
Potassium: 4.4 mmol/L (ref 3.5–5.2)
Protein, total: 6.6 g/dL (ref 6.0–8.5)
Sodium: 140 mmol/L (ref 134–144)

## 2020-06-08 LAB — CBC W/O DIFF
HCT: 38.1 % (ref 34.0–46.6)
HGB: 13 g/dL (ref 11.1–15.9)
MCH: 31.8 pg (ref 26.6–33.0)
MCHC: 34.1 g/dL (ref 31.5–35.7)
MCV: 93 fL (ref 79–97)
PLATELET: 275 10*3/uL (ref 150–450)
RBC: 4.09 x10E6/uL (ref 3.77–5.28)
RDW: 14.1 % (ref 11.7–15.4)
WBC: 6.5 10*3/uL (ref 3.4–10.8)

## 2020-06-08 LAB — HEMOGLOBIN A1C WITH EAG
Estimated average glucose: 128 mg/dL
Hemoglobin A1c: 6.1 % — ABNORMAL HIGH (ref 4.8–5.6)

## 2020-06-08 LAB — IRON PROFILE
Iron % saturation: 37 % (ref 15–55)
Iron: 122 ug/dL (ref 27–139)
TIBC: 331 ug/dL (ref 250–450)
UIBC: 209 ug/dL (ref 118–369)

## 2020-06-12 ENCOUNTER — Ambulatory Visit: Attending: Internal Medicine | Primary: Internal Medicine

## 2020-06-12 ENCOUNTER — Ambulatory Visit: Admit: 2020-06-12 | Discharge: 2020-06-12 | Payer: MEDICARE | Attending: Internal Medicine | Primary: Internal Medicine

## 2020-06-12 ENCOUNTER — Encounter

## 2020-06-12 ENCOUNTER — Encounter: Primary: Internal Medicine

## 2020-06-12 DIAGNOSIS — C3491 Malignant neoplasm of unspecified part of right bronchus or lung: Secondary | ICD-10-CM

## 2020-06-12 MED ORDER — ONDANSETRON HCL 8 MG TAB
8 mg | ORAL_TABLET | Freq: Three times a day (TID) | ORAL | 3 refills | Status: DC | PRN
Start: 2020-06-12 — End: 2020-06-20

## 2020-06-12 MED ORDER — LIDOCAINE-PRILOCAINE 2.5 %-2.5 % TOPICAL CREAM
CUTANEOUS | 0 refills | Status: AC | PRN
Start: 2020-06-12 — End: ?

## 2020-06-12 MED ORDER — FOLIC ACID 1 MG TAB
1 mg | ORAL_TABLET | Freq: Every day | ORAL | 3 refills | Status: AC
Start: 2020-06-12 — End: ?

## 2020-06-12 MED ORDER — PROCHLORPERAZINE MALEATE 10 MG TAB
10 mg | ORAL_TABLET | Freq: Four times a day (QID) | ORAL | 3 refills | Status: DC | PRN
Start: 2020-06-12 — End: 2020-06-20

## 2020-06-12 NOTE — Progress Notes (Signed)
Saw new patient with Dr Jenetta Loges for lung mass. Pt is scheduled for port placement with Dr Sheria Lang.The pathology confirmed Lung Adenocarcinoma, Stage II.  All of the lymph nodes tested were negative for malignancy.  The standard therapy includes 4 cycles of chemotherapy (Cisplatin and Alimta). It is given every 3 weeks for 4 cycles.  We will start treatment once you have had ample time to recover, January.  You will need to continue Folic Acid daily.  You will need a Vitamin B12 shot before we start treatment. Proceed with port placement with Dr Sheria Lang. Scheduled CE & FC. RTC to start in January. nausea and vomiting

## 2020-06-12 NOTE — Oncology Nurse Navigation (Signed)
START ON PATHWAY REGIMEN - Non-Small Cell Lung    [id number]        Pemetrexed (Alimta)       Cisplatin (Platinol)     **Always confirm dose/schedule in your pharmacy ordering system**    Patient Characteristics:  Postoperative without Neoadjuvant Therapy (Pathologic Staging), Stage IIB, Adjuvant Chemotherapy, Nonsquamous Cell  Therapeutic Status: Postoperative without Neoadjuvant Therapy (Pathologic Staging)  AJCC T Category: pT3  AJCC N Category: pN0  AJCC M Category: cM0  AJCC 8 Stage Grouping: IIB  Histology: Nonsquamous Cell  Intent of Therapy:  Curative Intent, Discussed with Patient

## 2020-06-12 NOTE — Progress Notes (Signed)
Progress Notes by Greer Ee, MD at 06/12/20 0930                Author: Greer Ee, MD  Service: --  Author Type: Physician       Filed: 06/12/20 1745  Encounter Date: 06/12/2020  Status: Signed          Editor: Greer Ee, MD (Physician)               [facility] Hematology and Oncology: Office Visit Established Patient      Chief Complaint:       Chief Complaint       Patient presents with        ?  Follow-up              History of Present Illness:   Audrey Snow is a  79 y.o. female who presents today for follow-up regarding adenocarcinoma of trhe lung.   On 02/27/20, Ms. Vanderweele presented to [facility] ED with complaints of achiness in chest with radiation of pain to her left upper extremity with association of N/V, SOB and diaphoresis. Cardiac work-up was negative; however her chest x-ray reported a 5.1 cm rounded  opacity at the right lung base.  She saw her PCP on 02/29/20, CT scan was ordered and reported a 5 cm mass with adjacent pleural nodularity posteriorly at the right lung base and considered suspicious for bronchogenic carcinoma. On 04/02/20 she had a PET/CT  scan that reported a 6 cm right lower lobe pulmonary mass with no metastatic disease.  Biopsy showed adenocarcinoma of lung origin.  We saw her prior to surgical evaluation and recommended proceeding with lobectomy and lymphadenectomy if she was a surgical  candidate.      Here for follow-up.  She completed VATS with left lower lobectomy as well as chest wall mass resection and lymphadenectomy on 05/07/20.  She tolerated this reasonably well, still with some post-op tenderness but nothing unmanageable.  She is not currently  on any pain medication.  No dyspnea or cough.       Review of Systems:   Constitutional: Negative.    HENT: Negative.    Eyes: Negative.    Respiratory: Negative.    Cardiovascular: Negative.    Gastrointestinal: Negative.    Genitourinary: Negative.    Musculoskeletal: Positive for right chest wall pain.    Skin:  Negative.    Neurological: Negative.    Endo/Heme/Allergies: Negative.    Psychiatric/Behavioral: Negative.    All other systems reviewed and are negative.         Allergies        Allergen  Reactions         ?  Yeast  Nausea and Vomiting             Pt. Says she hasnt had issues with this in a long time          Past Medical History:        Diagnosis  Date         ?  Arrhythmia  04/05/2020          PVCs.  04/05/20 was referred to [facility] for tachycardia, was seen 05/01/20 [facility]         ?  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  Autoimmune disease (  [place])            RA         ?  Bronchogenic cancer of right lung ([place])  04/28/2020          adenocarcinoma         ?  Chronic obstructive pulmonary disease (HCC)            uses inhalers         ?  Chronic pain of left knee  06/25/2018     ?  Coronary atherosclerosis            noted on chest CT         ?  GERD (gastroesophageal reflux disease)       ?  H/O cardiovascular stress test            Bruce protocol, low risk study, EF of 67% and normal LV function         ?  H/O mammogram  07/28/2016          mammogram and Ultrasound of right breaset- benign finding- [facility]          ?  Hearing loss            "only hears out of left ear"         ?  Hearing loss of both ears       ?  HTN (hypertension), benign  01/30/2014          controlled w/med         ?  Hypothyroid            on med for control         ?  Menopause       ?  Osteoarthritis       ?  Osteoporosis       ?  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 Debbra Riding. Methotrexate Rx         ?  Sinusitis       ?  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 Nancie Neas         ?  Unspecified hypothyroidism  01/30/2014         ?  Vitamin D deficiency            Past Surgical History:         Procedure  Laterality  Date          ?  COLONOSCOPY  N/A  01/27/2018          COLONOSCOPY/BMI 26 performed by Azalia Bilis, [place] at [place]          ?  HX CARPAL TUNNEL RELEASE  Bilateral       ?  HX COLONOSCOPY              colon polyps-adenomatous, has seen Dr. Kristopher Glee in past          ?  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  HX HEMORRHOIDECTOMY         ?  HX OTHER SURGICAL    09/2012          sinus surgery-Dr. Mickle Mallory          ?  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  HX TONSILLECTOMY    79 yrs old     ?  PR COLONOSCOPY FLX DX W/COLLJ SPEC WHEN PFRMD    01/27/2018                     Family History         Problem  Relation  Age of Onset          ?  OSTEOARTHRITIS  Mother       ?  Alzheimer's Disease  Father            ?  Breast Cancer  Neg Hx            Social History          Socioeconomic History         ?  Marital status:  MARRIED              Spouse name:  Not on file         ?  Number of children:  Not on file     ?  Years of education:  Not on file     ?  Highest education level:  Not on file       Occupational History        ?  Not on file       Tobacco Use         ?  Smoking status:  Former Smoker              Packs/day:  0.50         Years:  43.00         Pack years:  21.50         Types:  Cigarettes         Start date:  1961         Quit date:  1985         Years since quitting:  36.9         ?  Smokeless tobacco:  Never Used        ?  Tobacco comment: not regular when first started- 1pk/week       Vaping Use         ?  Vaping Use:  Never used       Substance and Sexual Activity         ?  Alcohol use:  No              Alcohol/week:  0.0 standard drinks         ?  Drug use:  No     ?  Sexual activity:  Not on file        Other Topics  Concern        ?  Not on file       Social History Narrative          Married and lives with husband.  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          Social Determinants of Health          Financial Resource Strain:         ?  Difficulty of Paying Living Expenses: Not on file       Food Insecurity:         ?  Worried About Charity fundraiser in the Last Year: Not on file     ?  Ran Out of Food in the Last Year: Not on file       Transportation Needs:         ?  Lack of Transportation (Medical): Not on file     ?  Lack of Transportation (Non-Medical): Not on file       Physical Activity:         ?  Days of Exercise per Week: Not on file     ?  Minutes of Exercise per Session: Not on file       Stress:         ?  Feeling of Stress : Not on file       Social Connections:         ?  Frequency of Communication with Friends and Family: Not on file     ?  Frequency of Social Gatherings with Friends and Family: Not on file     ?  Attends Religious Services: Not on file     ?  Active Member of Clubs or Organizations: Not on file     ?  Attends Archivist Meetings: Not on file     ?  Marital Status: Not on file       Intimate Partner Violence:         ?  Fear of Current or Ex-Partner: Not on file     ?  Emotionally Abused: Not on file     ?  Physically Abused: Not on file     ?  Sexually Abused: Not on file       Housing Stability:         ?  Unable to Pay for Housing in the Last Year: Not on file     ?  Number of Places Lived in the Last Year: Not on file        ?  Unstable Housing in the Last Year: Not on file          Current Outpatient Medications          Medication  Sig  Dispense  Refill           ?  folic acid (FOLVITE) 1 mg tablet  Take 1 Tablet by mouth daily.  90 Tablet  3     ?  umeclidinium-vilanteroL (Anoro Ellipta) 62.5-25 mcg/actuation inhaler  Take 1 Puff by inhalation daily.  1 Each  11     ?  atorvastatin (LIPITOR) 20 mg tablet  Take 20 mg by mouth daily.         ?  ergocalciferol (ERGOCALCIFEROL) 1,250 mcg (50,000 unit) capsule  Take 50,000 Units by mouth every seven (7) days.         ?  mupirocin (BACTROBAN) 2 % ointment  Apply  to affected area daily. (Patient taking differently: Apply  to affected area as needed.)  15 g  1     ?  levothyroxine (SYNTHROID) 100 mcg tablet  Take 1 Tablet by mouth  Daily (before breakfast).  30 Tablet  11     ?  gabapentin (NEURONTIN) 100 mg capsule  Take 100 mg by mouth three (3) times daily as needed.         ?  albuterol (PROVENTIL HFA, VENTOLIN HFA, PROAIR HFA) 90 mcg/actuation inhaler  Take 1 Puff by inhalation every six (6) hours as needed for  Wheezing.  1 Inhaler  5     ?  losartan (COZAAR) 100 mg tablet  TAKE 1 TABLET BY MOUTH DAILY  90 Tab  3     ?  fluticasone propionate (FLONASE) 50 mcg/actuation nasal spray  2 Sprays by Both Nostrils route daily. (Patient taking differently: 2 Sprays by Both Nostrils route daily as needed.)  1 Bottle  5     ?  bacitracin-neomycin-polymyxin b-hydrocortisone (CORTISPORIN) 1 % ointment  Use samll amount 3-4 times a day.  15 g  5     ?  denosumab (PROLIA) 60 mg/mL injection  60 mg by SubCUTAneous route every 6 months. Due December 2021         ?  Cholecalciferol, Vitamin D3, 50,000 unit cap  Take 1 Capsule by mouth every Monday.         ?  methotrexate, PF, 25 mg/mL injection  25 mg every seven (7) days. Friday evening         ?  nicotinic acid (NIACIN) 500 mg tablet  Take 500 mg by mouth Daily (before breakfast). 2 po everyday.          ?  calcium-cholecalciferol, d3, (CALCIUM 600 + D) 600-125 mg-unit tab  Take 2 Tabs by mouth.         ?  multivitamin (ONE A DAY) tablet  Take 1 Tab by mouth daily.               ?  prochlorperazine (Compazine) 10 mg tablet  Take 1 Tablet by mouth every six (6) hours as needed for Nausea. Indications: nausea and vomiting caused by cancer drugs, nausea and vomiting  90 Tablet  3           ?  ondansetron hcl (Zofran) 8 mg tablet  Take 1 Tablet by mouth every eight (8) hours as needed for Nausea. Indications: nausea and vomiting caused by cancer drugs  60 Tablet  3     ?  lidocaine-prilocaine (EMLA) topical cream  Apply  to affected area as needed for Pain.  30 g  0           ?  metoprolol succinate (TOPROL-XL) 50 mg XL tablet  Take 50 mg by mouth daily. (Patient not taking: Reported on 06/12/2020)                OBJECTIVE:   Visit Vitals      BP  105/61     Pulse  75     Temp  97.7 ??F (36.5 ??C) (Oral)     Resp  16     Ht  5\' 10"  (1.778 m)     Wt  154 lb 14.4 oz (70.3 kg)     SpO2  97%        BMI  22.23 kg/m??           Physical Exam:      Constitutional:  Well developed, well nourished female in no acute  distress, sitting comfortably on the examination table.         HEENT:  Normocephalic and atraumatic. Sclerae anicteric. Neck supple without JVD. No thyromegaly present.      Lymph node     No palpable submandibular, cervical, supraclavicular lymph nodes.     Skin  Warm and dry.  No bruising and no rash noted.  No erythema.  No pallor.      Respiratory  Lungs are clear to auscultation bilaterally without wheezes, rales or rhonchi,  normal air exchange without accessory muscle use.      CVS  Normal rate, regular rhythm and normal S1 and S2.  No murmurs, gallops, or rubs.     Abdomen  Soft, nontender and nondistended, normoactive bowel sounds.  No palpable mass.  No hepatosplenomegaly.     Neuro  Grossly nonfocal with no obvious sensory or motor deficits.     MSK  Normal range of motion in general.  No edema and no tenderness.        Psych  Appropriate mood and affect.         Labs:   No results found for this or any previous visit (from the past 24 hour(s)).      Imaging:   EXAM: PET/CT TUMOR IMAGE SKULL THIGH W (INI)   ??   INDICATION: highly suspicious for lung cancer.   ??   PROCEDURE: Following IV injection of 13.91 mCi 18 Fluoro 2 deoxyglucose (FDG)   and a standard uptake delay, PET imaging is performed from head to thighs and   axial, sagittal and coronal images were acquired. Unenhanced CT is obtained for   anatomic localization, and attenuation correction of the PET scan. Patient   preprocedure blood glucose level: 1:15mg /dL.   ??   CORRELATIVE IMAGING STUDIES: CT chest dated 02/29/2020.   ??   COMPARISON PET: No prior PET/CT   ??   HISTORY: The study is requested for initial treatment strategy. .   ??   FINDINGS:    ??   HEAD/NECK:    Mildly hypermetabolic nodule within the superficial left parotid gland, favored   to represent a lymph node. No other hypermetabolic cervical lymphadenopathy.   Cerebral evaluation is limited by normal intense activity.   ??   CHEST:    Redemonstrated 6 cm pulmonary mass within the right lower lobe which   demonstrates peripheral nodular hypermetabolic activity (max SUV 8.9).   Additional nodular area inferiorly along the right posterior pleura which   demonstrates increased metabolic activity (max SUV 8.9). No hypermetabolic hilar   or mediastinal lymphadenopathy. Scattered calcified atherosclerotic disease   ??   ABDOMEN/PELVIS:    No foci of abnormal hypermetabolism. Suspected physiologic uptake within the   gastrointestinal and genitourinary tracts.   ??   SKELETON:    No foci of abnormal hypermetabolism in the axial and visualized appendicular   skeleton.   ??   IMPRESSION   1.  The 6 cm right lower lobe pulmonary mass demonstrates peripheral nodular   hypermetabolic activity, concerning for primary lung neoplasm with central   necrosis.   2.  No hypermetabolic lymphadenopathy or distant metastatic disease.   3.  Mildly enlarged and metabolic left parotid lymph node, likely reactive in   Etiology.         Pathology:   * * *DIAGNOSIS* * * * * * * * * * * * * * * * * * * * * * * * * * * * * * * ??     ???? ?? A: ??"CHEST WALL MASS CONTENTS":   ???? ?? NECROTIC DEBRIS  WITH OUT IDENTIFIABLE VIABLE MALIGNANT TUMOR   ???? ??   B: ??"NUMBER 8 LYMPH NODE X2":   ???? ?? ANTHRACOTIC LYMPH NODE NEGATIVE FOR METASTATIC TUMOR   ???? ??   C: ??"NUMBER 7 LYMPH NODE X4":   ????  ?? FOUR FRAGMENTS OF ANTHRACOTIC LYMPH NODE NEGATIVE FOR METASTATIC   TUMOR   ???? ??  D: ??"INTERLOBAR LYMPH NODE":   ???? ?? FOUR FRAGMENTS OF ANTHRACOTIC LYMPH NODE NEGATIVE FOR METASTATIC   TUMOR   ????  ??   E: ??"RIGHT LOWER LOBE OF LUNG":   ???? ?? POORLY DIFFERENTIATED MALIGNANT TUMOR CONSISTENT WITH POORLY   DIFFERENTIATED ADENOCARCINOMA MEASURING AP PROXIMALLY  ??6.4 X 5.2 X 4.6 CM   WITH EXTENSIVE NECROSIS. BRONCHIAL  AND VASCULAR MARGINS OF RESECTION ARE   NEGATIVE FOR MALIGNANT TUMOR. FOUR ANTHRACOTIC PERIBRONCHIAL LYMPH NODES   NEGATIVE FOR METASTATIC TUMOR. SPECIMEN MARKED "CHEST WALL MASS" INVOLVED   BY POORLY DIFFERENTIATED MALIGNANT TUMOR SIMILAR  TO THAT WITHIN LUNG. SEE   COMMENT          * * *DIAGNOSIS* * * * * * * * * * * * * * * * * * * * * * * * * * * * * * * ??     ???? ?? "RIGHT LUNG MASS": ??IMMUNOHISTOCHEMICAL FINDINGS MOST CONSISTENT  WITH   ADENOCARCINOMA OF LUNG ORIGIN.           tls/04/17/2020     * * *Electronically Signed Out* * * ?? ?? ??   Sign Out Date: 04/18/2020 ??Charles A. Renella Cunas, MD           * * *PROCEDURES/ADDENDA*  * * * * * * * * * * * * * * * * * * * * ??* * * * ??     ???? ?? ?? ?? ?? ?? ?? ?? ??STF-IMMUNOHISTOCHEMISTRY     ???? ?? ?? ?? ?? ?? ?? ?? ?? ?? ?? ?? ?? ?? ?? ?? ?? ?? ??  ?? ?? ?? ?? ?? ?? ?? ?? ?? ?? ?? ?? ?? ?? ?? ?? ?? ??   ??Status: ??Reviewed By:   ??Charles A. Lorel Monaco, III, MD on 04/18/2020   ???? ??   ???? ?? ?? ?? ?? ?? ?? ?? ??  ?? ?? Interpretation   ???? ?? ?? ?? ?? ?? ?? ?? ?? ??Immunohistochemical Stain Panel:     ???? ?? Interpretation: ??Immunohistochemical findings most consistent with   adenocarcinoma of lung origin.      Antibody/Test ?? ?? ?? ?? ?? ?? ?? Marker For ?? ?? ?? ?? ?? ?? ?? ?? ?? ?? ?? ?? ?? ?? ??Result   CK 5/6 ?? ?? ?? ?? ?? ?? ?? ?? ?? ??Squamous cell carcinomas and mesotheliomas ??  ?? ??   ???? ??Negative   p40 ?? ?? ?? ?? ?? ?? ?? ?? ?? ??Squamous differentiation ?? ?? ?? ?? ?? ?? ?? ?? ?? ??   Negative   TTF-1 ?? ?? ?? ?? ?? ?? ?? ?? ?? ?? Lung  and thyroid adenocarcinoma ?? ?? ?? ?? ?? ?? ??   Positive   Napsin ?? ?? ?? ?? ?? ?? ?? ?? ?? ?? Lung adenocarcinoma ?? ?? ?? ?? ?? ?? ?? ?? ?? ?? ?? ??   Negative ??                ASSESSMENT:             ICD-10-CM  ICD-9-CM             1.  Adenocarcinoma of right lung ([place])   C34.91  162.9             Problem List   Date Reviewed:  06/12/2020  Codes  Class  Noted             Adenocarcinoma of right lung, stage 2 ([place])  ICD-10-CM: C34.91   ICD-9-CM: 162.9    06/12/2020                       Side effect  of medication  ICD-10-CM: T88.7XXA   ICD-9-CM: 995.20    06/12/2020                       Large cell carcinoma of lung, right ([place])  ICD-10-CM: C34.91   ICD-9-CM: 162.9    05/07/2020                       Bronchogenic cancer of right lung ([place])  ICD-10-CM: C34.91   ICD-9-CM: 162.9    04/28/2020                       Right lower lobe lung mass  ICD-10-CM: R91.8   ICD-9-CM: 786.6    03/20/2020                       Dry skin dermatitis  ICD-10-CM: L85.3   ICD-9-CM: 692.89    01/03/2020                       Bilateral leg cramps  ICD-10-CM: R25.2   ICD-9-CM: 729.82    01/03/2020                       Elevated blood sugar  ICD-10-CM: R73.9   ICD-9-CM: 790.29    01/03/2020                       Hyperlipidemia  ICD-10-CM: E78.5   ICD-9-CM: 272.4    01/04/2019                       Chronic obstructive pulmonary disease ([place])  ICD-10-CM: J44.9   ICD-9-CM: 161    01/04/2019                       Pre-ulcerative corn or callous  ICD-10-CM: L84   ICD-9-CM: 700    06/25/2018                       Chronic pain of left knee  ICD-10-CM: M25.562, G89.29   ICD-9-CM: 719.46, 338.29    01/26/2018          Overview Signed 02/29/2020 12:10 PM by Selinda Orion, NP            Last Assessment & Plan:    Formatting of this note might be different from the original.   Discussed in detail today.  We will see if we can get Visco therapy approved.  Referral to PT.                                   Prediabetes  ICD-10-CM: R73.03   ICD-9-CM: 790.29    06/10/2016                       Chronic right-sided low back pain without sciatica  ICD-10-CM: M54.50, G89.29   ICD-9-CM: 724.2, 338.29  02/12/2016                       Environmental allergies  ICD-10-CM: Z91.09   ICD-9-CM: V15.09    10/05/2014                       Rheumatoid arthritis involving multiple sites with positive rheumatoid factor (HCC)  ICD-10-CM: M05.79   ICD-9-CM: 714.0    01/30/2014          Overview Signed 02/29/2020 12:10 PM by Selinda Orion, NP            Last Assessment & Plan:     Formatting of this note might be different from the original.   Chronic/Stable. Denies any recent RA flares. Patient is taking MTX and folic acid. Denies any adverse side effects. Continue with current treatment plan. Reviewed previous labs. Ordered labs today. Will continue to monitor over time.      Instructed patient to contact our office with any new or worsening symptoms.                                   Hypothyroidism  ICD-10-CM: E03.9   ICD-9-CM: 244.9    01/30/2014                       Vitamin D deficiency  ICD-10-CM: E55.9   ICD-9-CM: 268.9    01/30/2014          Overview Signed 02/29/2020 12:10 PM by Selinda Orion, NP            Last Assessment & Plan:    Formatting of this note might be different from the original.   Encouraged appropriate vitamin D supplementation. Monitor for signs or symptoms of hypercalcemia.                                   Osteoporosis  ICD-10-CM: M81.0   ICD-9-CM: 733.00    01/30/2014                       Gastroesophageal reflux disease without esophagitis  ICD-10-CM: K21.9   ICD-9-CM: 530.81    01/30/2014                       HTN (hypertension)  ICD-10-CM: I10   ICD-9-CM: 401.9    01/30/2014                             PLAN:   Lab studies and imaging studies were personally reviewed.      Pertinent old records were reviewed.      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  PET/CT shows no distant disease.  Clinically T3N0M0.  She completed VATS with left lower lobectomy as well as chest wall mass resection  and lymphadenectomy on 05/07/20.  She tolerated this reasonably well, still with some post-op tenderness but nothing unmanageable.  She is not currently on any pain medication.  No dyspnea or cough.  I discussed the surgical pathology with  Ms. Knobloch and her family.  Her tumor was 6.4 cm in greatest dimension with carcinoma in the  specimen marked "  chest wall" but with separation from tumor by connective  tissue and lung parenchyma.  13 lymph node fragments were all negative for tumor.  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  Because  of the T3N0 disease, we would consider adjuvant chemotherapy, likely cisplatin and pemetrexed for 4 cycles.  We do also have the ALCHEMIST trial available, but unfortunately she is not a candidate due to her history of RA with MTX use.  Potential side  effects were reviewed including fatigue, nausea, diarrhea, stomatitis, cytopenias, infections, bleeding, renal dysfunction, neuropathy and others.  She understands and agrees to proceed.  All questions were asked and answered to the best of my ability.   In all, I spent 40 minutes in the care of Ms. Sanjurjo  today, over 50% of which was in direct counseling and coordination of care.  Follow-up in 2-3 weeks for cycle 1 cis/pem.                   Greer Ee, MD   [facility] [place]ology [place] [address]   Office : [phone]   Fax : [phone]

## 2020-06-14 IMAGING — DX CHEST - 2 VIEW
2 series · 2 of 2 positions shown · non-contrast
Comparison: None.

CLINICAL DATA: Cough for 4 days

EXAM:
CHEST - 2 VIEW

[chest pa]
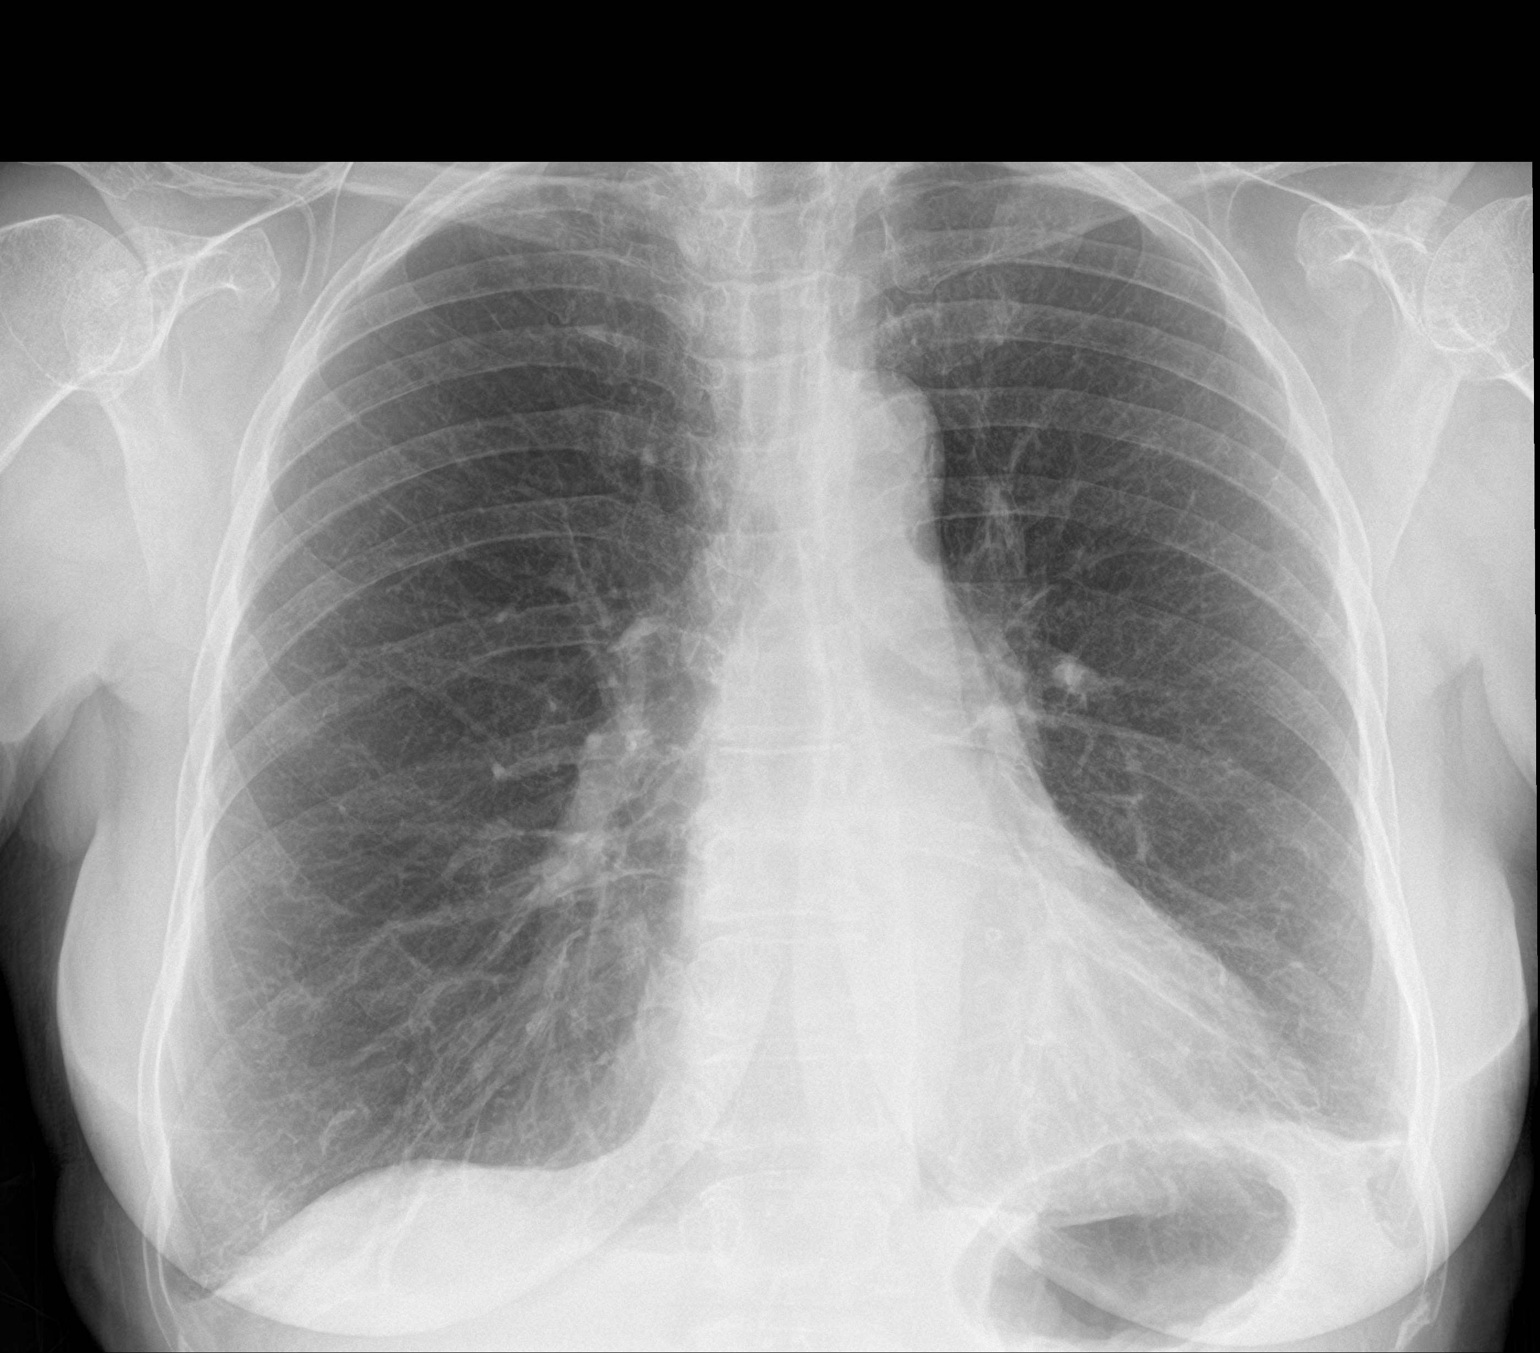

[chest lat]
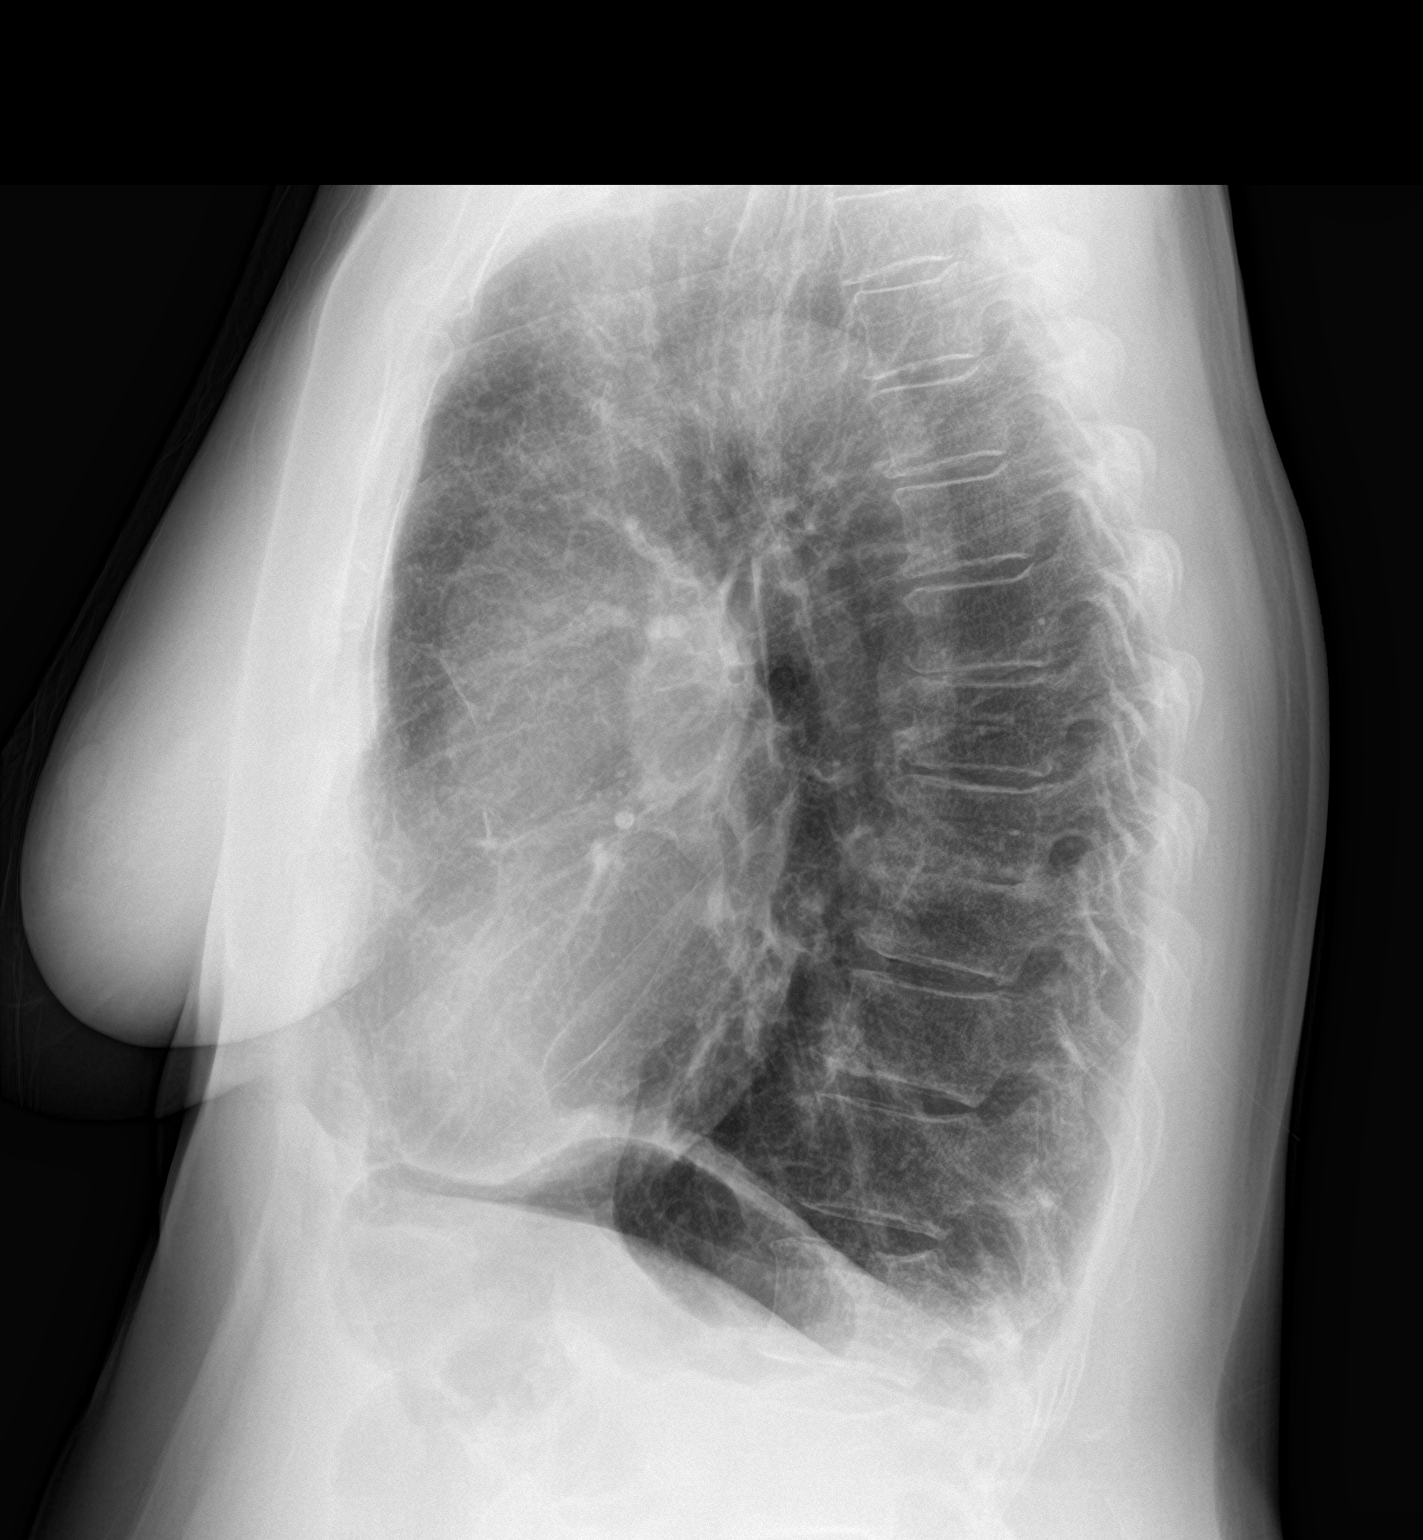

[2 of 2 positions shown; findings below may reference images not displayed]

FINDINGS: The heart size and mediastinal contours are within normal limits.
Lungs are hyperinflated. No focal consolidation. Blunting of the
left costophrenic angle, likely chronic pleural reaction/scarring.
The visualized skeletal structures are unremarkable.
IMPRESSION: Hyperinflation without focal airspace disease.

## 2020-06-15 ENCOUNTER — Inpatient Hospital Stay: Primary: Internal Medicine

## 2020-06-19 ENCOUNTER — Inpatient Hospital Stay: Admit: 2020-06-19 | Payer: MEDICARE | Primary: Internal Medicine

## 2020-06-19 DIAGNOSIS — C3491 Malignant neoplasm of unspecified part of right bronchus or lung: Secondary | ICD-10-CM

## 2020-06-19 MED ORDER — CYANOCOBALAMIN 1,000 MCG/ML IJ SOLN
1000 mcg/mL | Freq: Once | INTRAMUSCULAR | Status: AC
Start: 2020-06-19 — End: 2020-06-19
  Administered 2020-06-19: 21:00:00 via INTRAMUSCULAR

## 2020-06-19 MED FILL — CYANOCOBALAMIN 1,000 MCG/ML IJ SOLN: 1000 mcg/mL | INTRAMUSCULAR | Qty: 1

## 2020-06-19 NOTE — Progress Notes (Signed)
Arrived to the [place]. B12 injection completed. Patient tolerated well.  Any issues or concerns during appointment: none. Patient aware of next lab and [facility] office visit on 07/04/20 at 1000. Discharged ambulatory.

## 2020-06-20 NOTE — Interval H&P Note (Signed)
Patient verified name and DOB.    Order for consent found in EHR and matches case posting; patient verifies procedure.     Type 1B  surgery, phone assessment complete.  Orders received.    Labs per surgeon: Unknown  Labs per anesthesia protocol: None per protocol    Patient COVID test date: MAC case - covid testing not required prior to surgery. Patient has had both covid Pfizer vaccines and the booster given: 07/29/2019 and 08/15/2019 then the booster on 03/12/2020.     Patient answered medical/surgical history questions at their best of ability. All prior to admission medications documented in [facility].    Patient instructed to take the following medications the day of surgery according to anesthesia guidelines with a small sip of water: use inhalers and bring them DOS; take with water sips: levothyroxine and metoprolol.  Hold all vitamins prior to surgery and NSAIDS prior to surgery. Prescription meds to hold: folic acid (folvite), losartan, niacin.         Patient instructed on the following:    > Arrive at [employer], time of arrival to be called the day before by 1700  > NPO after midnight including gum, mints, and ice chips  > Responsible adult must drive patient to the hospital, stay during surgery, and patient will need supervision 24 hours after anesthesia  > Use antibacterial soap in shower the night before surgery and on the morning of surgery  > All piercings must be removed prior to arrival.    > Leave all valuables (money and jewelry) at home but bring insurance card and ID on DOS.   > You may be required to pay a deductible or co-pay on the day of your procedure. You can pre-pay by calling [phone] if your surgery is at the [facility] or [phone] if your surgery is at the [facility].  > Do not wear make-up, nail polish, lotions, cologne, perfumes, powders, or oil on skin. Artificial nails are not permitted.

## 2020-06-21 ENCOUNTER — Inpatient Hospital Stay: Payer: MEDICARE

## 2020-06-21 ENCOUNTER — Ambulatory Visit: Admit: 2020-06-21 | Payer: MEDICARE | Primary: Internal Medicine

## 2020-06-21 LAB — COVID-19, RAPID: SARS-CoV-2, Rapid: NOT DETECTED

## 2020-06-21 LAB — COVID-19 RAPID TEST: COVID-19 rapid test: NOT DETECTED

## 2020-06-21 MED ORDER — SODIUM CHLORIDE 0.9 % INJECTION
25 mg/mL | INTRAMUSCULAR | Status: DC | PRN
Start: 2020-06-21 — End: 2020-06-21

## 2020-06-21 MED ORDER — BUPIVACAINE (PF) 0.25 % (2.5 MG/ML) IJ SOLN
0.25 % (2.5 mg/mL) | INTRAMUSCULAR | Status: AC
Start: 2020-06-21 — End: ?

## 2020-06-21 MED ORDER — OXYCODONE 5 MG TAB
5 mg | Freq: Once | ORAL | Status: DC | PRN
Start: 2020-06-21 — End: 2020-06-21

## 2020-06-21 MED ORDER — HEPARIN, PORCINE (PF) 100 UNIT/ML IV SYRINGE
100 unit/mL | INTRAVENOUS | Status: AC
Start: 2020-06-21 — End: ?

## 2020-06-21 MED ORDER — PROPOFOL 10 MG/ML IV EMUL
10 mg/mL | INTRAVENOUS | Status: DC | PRN
Start: 2020-06-21 — End: 2020-06-21
  Administered 2020-06-21 (×3): via INTRAVENOUS

## 2020-06-21 MED ORDER — LIDOCAINE HCL 1 % (10 MG/ML) IJ SOLN
10 mg/mL (1 %) | Freq: Once | INTRAMUSCULAR | Status: DC
Start: 2020-06-21 — End: 2020-06-21

## 2020-06-21 MED ORDER — EPHEDRINE (PF) 50 MG/5 ML (10 MG/ML) IN NS IV SYRINGE
50 mg/5 mL (10 mg/mL) | INTRAVENOUS | Status: DC | PRN
Start: 2020-06-21 — End: 2020-06-21
  Administered 2020-06-21 (×2): via INTRAVENOUS

## 2020-06-21 MED ORDER — SODIUM CHLORIDE 0.9 % IRRIGATION SOLN
0.9 % | Status: DC | PRN
Start: 2020-06-21 — End: 2020-06-21
  Administered 2020-06-21: 14:00:00

## 2020-06-21 MED ORDER — SODIUM CHLORIDE 0.9 % IJ SYRG
Freq: Three times a day (TID) | INTRAMUSCULAR | Status: DC
Start: 2020-06-21 — End: 2020-06-21

## 2020-06-21 MED ORDER — SODIUM CHLORIDE 0.9 % IV
INTRAVENOUS | Status: AC
Start: 2020-06-21 — End: ?

## 2020-06-21 MED ORDER — MIDAZOLAM 1 MG/ML IJ SOLN
1 mg/mL | Freq: Once | INTRAMUSCULAR | Status: DC | PRN
Start: 2020-06-21 — End: 2020-06-21

## 2020-06-21 MED ORDER — BUPIVACAINE (PF) 0.25 % (2.5 MG/ML) IJ SOLN
0.25 % (2.5 mg/mL) | INTRAMUSCULAR | Status: DC | PRN
Start: 2020-06-21 — End: 2020-06-21
  Administered 2020-06-21: 14:00:00 via SUBCUTANEOUS

## 2020-06-21 MED ORDER — PROPOFOL 10 MG/ML IV EMUL
10 mg/mL | INTRAVENOUS | Status: DC | PRN
Start: 2020-06-21 — End: 2020-06-21
  Administered 2020-06-21 (×2): via INTRAVENOUS

## 2020-06-21 MED ORDER — CEFAZOLIN 2 GRAM/20 ML IN STERILE WATER INTRAVENOUS SYRINGE
2 gram/0 mL | Freq: Once | INTRAVENOUS | Status: AC
Start: 2020-06-21 — End: 2020-06-21
  Administered 2020-06-21: 14:00:00 via INTRAVENOUS

## 2020-06-21 MED ORDER — LACTATED RINGERS IV
INTRAVENOUS | Status: DC
Start: 2020-06-21 — End: 2020-06-21

## 2020-06-21 MED ORDER — LIDOCAINE (PF) 20 MG/ML (2 %) IJ SOLN
20 mg/mL (2 %) | INTRAMUSCULAR | Status: DC | PRN
Start: 2020-06-21 — End: 2020-06-21
  Administered 2020-06-21: 14:00:00 via INTRAVENOUS

## 2020-06-21 MED ORDER — LACTATED RINGERS IV
INTRAVENOUS | Status: DC
Start: 2020-06-21 — End: 2020-06-21
  Administered 2020-06-21: 13:00:00 via INTRAVENOUS

## 2020-06-21 MED ORDER — SODIUM CHLORIDE 0.9 % IJ SYRG
INTRAMUSCULAR | Status: DC | PRN
Start: 2020-06-21 — End: 2020-06-21

## 2020-06-21 MED ORDER — HEPARIN, PORCINE (PF) 100 UNIT/ML IV SYRINGE
100 unit/mL | INTRAVENOUS | Status: DC | PRN
Start: 2020-06-21 — End: 2020-06-21
  Administered 2020-06-21: 14:00:00

## 2020-06-21 MED ORDER — HYDROMORPHONE 2 MG/ML INJECTION SOLUTION
2 mg/mL | INTRAMUSCULAR | Status: DC | PRN
Start: 2020-06-21 — End: 2020-06-21

## 2020-06-21 MED FILL — HEPARIN LOCK FLUSH (PORCINE) (PF) 100 UNIT/ML INTRAVENOUS SYRINGE: 100 unit/mL | INTRAVENOUS | Qty: 5

## 2020-06-21 MED FILL — CEFAZOLIN 2 GRAM/20 ML IN STERILE WATER INTRAVENOUS SYRINGE: 2 gram/0 mL | INTRAVENOUS | Qty: 20

## 2020-06-21 MED FILL — SODIUM CHLORIDE 0.9 % IV: INTRAVENOUS | Qty: 250

## 2020-06-21 MED FILL — MARCAINE (PF) 0.25 % (2.5 MG/ML) INJECTION SOLUTION: 0.25 % (2.5 mg/mL) | INTRAMUSCULAR | Qty: 30

## 2020-06-21 NOTE — Anesthesia Post-Procedure Evaluation (Signed)
Procedure(s):  PORT PLACEMENT.    total IV anesthesia    Anesthesia Post Evaluation      Multimodal analgesia: multimodal analgesia used between 6 hours prior to anesthesia start to PACU discharge  Patient location during evaluation: PACU  Patient participation: complete - patient participated  Level of consciousness: awake and alert  Pain management: adequate  Airway patency: patent  Anesthetic complications: no  Cardiovascular status: acceptable  Respiratory status: acceptable  Hydration status: acceptable  Post anesthesia nausea and vomiting:  none      INITIAL Post-op Vital signs:   Vitals Value Taken Time   BP 129/58 06/21/20 1015   Temp 36.3 ??C (97.3 ??F) 06/21/20 0932   Pulse 58 06/21/20 1025   Resp 16 06/21/20 1000   SpO2 87 % 06/21/20 1025   Vitals shown include unvalidated device data.

## 2020-06-21 NOTE — Op Note (Signed)
dictated

## 2020-06-21 NOTE — H&P (Signed)
H&P/Consult Note/Progress Note/Office Note:   Audrey Snow  MRN: [id number]  DOB:Nov 05, 1940  Age:79 y.o.    HPI: Audrey Snow is a 79 y.o. female who has lung cancer, s/p resection, needs a port for adjuvant chemo.           Past Medical History:   Diagnosis Date   ??? Arrhythmia 04/05/2020    PVCs.  04/05/20 was referred to [facility] for tachycardia, was seen 05/01/20 [facility]   ??? 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 Autoimmune disease ([place])     RA   ??? 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 Chronic obstructive pulmonary disease (HCC)     uses inhalers   ??? Chronic pain of left knee 06/25/2018   ??? Coronary atherosclerosis     noted on chest CT   ??? COVID-19 vaccine series completed 08/15/2019    [employer]; 07/29/2019 and booster given 03/12/2020   ??? GERD (gastroesophageal reflux disease)     no meds at this time   ??? H/O cardiovascular stress test     Bruce protocol, low risk study, EF of 67% and normal LV function   ??? H/O mammogram 07/28/2016    mammogram and Ultrasound of right breaset- benign finding- [facility]    ??? Hearing loss     "only hears out of left ear"   ??? Hearing loss of both ears     no hearing aids    ??? HTN (hypertension), benign 01/30/2014    controlled w/med   ??? Hypothyroid     on med for control   ??? Menopause    ??? Osteoarthritis    ??? Osteoporosis     prolia    ??? Poor historian     patient had trouble remembering names of meds and what they were for and had to repeat instructions for surgery several times    ??? 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 Debbra Riding. Methotrexate Rx   ??? Sinusitis     sinus flonase    ??? 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 Nancie Neas   ??? Unspecified hypothyroidism 01/30/2014    thyroid medication   ??? Vitamin D deficiency     medications for this      Past Surgical History:   Procedure Laterality Date   ??? COLONOSCOPY N/A 01/27/2018    COLONOSCOPY/BMI 26  performed by Azalia Bilis, MD at [place]   ??? HX CARPAL TUNNEL RELEASE Bilateral    ??? HX COLONOSCOPY      colon polyps-adenomatous, has seen Dr. Kristopher Glee in past   ??? 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 HX HEMORRHOIDECTOMY     ??? HX OTHER SURGICAL  09/2012    sinus surgery-Dr. Mickle Mallory   ??? 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 HX TONSILLECTOMY  79 yrs old   ??? PR COLONOSCOPY FLX DX W/COLLJ SPEC WHEN PFRMD  01/27/2018          Current Facility-Administered Medications   Medication Dose Route Frequency   ??? lidocaine (XYLOCAINE) 10 mg/mL (1 %) injection 0.3 mL  0.3 mL SubCUTAneous ONCE   ??? lactated Ringers infusion  100 mL/hr IntraVENous CONTINUOUS   ??? midazolam (VERSED) injection 2 mg  2 mg IntraVENous ONCE PRN   ???  ceFAZolin (ANCEF) 2 g/20 mL in sterile water IV syringe  2 g IntraVENous ONCE   ??? sodium chloride (NS) flush 5-40 mL  5-40 mL IntraVENous Q8H   ??? sodium chloride (NS) flush 5-40 mL  5-40 mL IntraVENous PRN     Yeast  Social History     Socioeconomic History   ??? Marital status: MARRIED   Tobacco Use   ??? Smoking status: Former Smoker     Packs/day: 0.50     Years: 43.00     Pack years: 21.50     Types: Cigarettes     Start date: 1961     Quit date: 1985     Years since quitting: 36.9   ??? Smokeless tobacco: Never Used   ??? Tobacco comment: not regular when first started- 1pk/week   Vaping Use   ??? Vaping Use: Never used   Substance and Sexual Activity   ??? Alcohol use: No     Alcohol/week: 0.0 standard drinks   ??? Drug use: No   Social History Narrative    Married and lives with husband.  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     Social History     Tobacco Use   Smoking Status Former Smoker   ??? Packs/day: 0.50   ??? Years: 43.00   ??? Pack years: 21.50   ??? Types: Cigarettes   ??? Start date: 23   ??? Quit date: 33   ??? Years since quitting: 36.9   Smokeless Tobacco Never Used   Tobacco Comment    not regular when first started- 1pk/week     Family History   Problem Relation  Age of Onset   ??? OSTEOARTHRITIS Mother    ??? Alzheimer's Disease Father    ??? Breast Cancer Neg Hx      ROS: The patient has no difficulty with chest pain or shortness of breath.  No fever or chills.  Comprehensive review of systems was otherwise unremarkable except as noted above.    Physical Exam:   Visit Vitals  BP 102/70 (BP 1 Location: Left upper arm, BP Patient Position: Sitting)   Pulse (!) 55   Temp 97.8 ??F (36.6 ??C)   Resp 16   Ht 5' 6.5" (1.689 m)   Wt 155 lb (70.3 kg)   SpO2 95%   BMI 24.64 kg/m??     Vitals:    06/21/20 0739   BP: 102/70   Pulse: (!) 55   Resp: 16   Temp: 97.8 ??F (36.6 ??C)   SpO2: 95%   Weight: 155 lb (70.3 kg)   Height: 5' 6.5" (1.689 m)     No intake/output data recorded.  No intake/output data recorded.    Constitutional: Alert, oriented, cooperative patient in no acute distress; appears stated age    Eyes:Sclera are clear. EOMs intact  ENMT: no external lesions gross hearing normal; no obvious neck masses, no ear or lip lesions, nares normal  CV: RRR. Normal perfusion  Resp: No JVD.  Breathing is  non-labored; no audible wheezing.    GI: soft and non-distended     Musculoskeletal: unremarkable with normal function. No embolic signs or cyanosis.   Neuro:  Oriented; moves all 4; no focal deficits  Psychiatric: normal affect and mood, no memory impairment    Recent vitals (if inpt):  Patient Vitals for the past 24 hrs:   BP Temp Pulse Resp SpO2 Height Weight   06/21/20 0739 102/70 97.8 ??F (36.6 ??C) (!) 55  16 95 % 5' 6.5" (1.689 m) 155 lb (70.3 kg)       Amount and/or Complexity of Data Reviewed and Analyzed:  I reviewed and analyzed all of the unique labs and radiologic studies that are shown below as well as any that are in the HPI, and any that are in the expanded problem list below  *Each unique test, order, or document contributes to the combination of 2 or combination of 3 in Category 1 below.  For this visit I also reviewed old records and prior notes.      No results for input(s):  WBC, HGB, PLT, NA, K, CL, CO2, BUN, CREA, GLU, PTP, INR, APTT, TBIL, TBILI, CBIL, ALT, AP, AML, AML, LPSE, LCAD, NH4, TROPT, TROIQ, PCO2, PO2, HCO3, HGBEXT, PLTEXT, INREXT in the last 72 hours.    No lab exists for component: SGOT, GPT,  PH  Review of most recent CBC  Lab Results   Component Value Date/Time    WBC 6.5 06/07/2020 02:57 PM    HGB 13.0 06/07/2020 02:57 PM    HCT 38.1 06/07/2020 02:57 PM    PLATELET 275 06/07/2020 02:57 PM    MCV 93 06/07/2020 02:57 PM       Review of most recent BMP  Lab Results   Component Value Date/Time    Sodium 140 06/07/2020 02:57 PM    Potassium 4.4 06/07/2020 02:57 PM    Chloride 100 06/07/2020 02:57 PM    CO2 23 06/07/2020 02:57 PM    Anion gap 2 (L) 05/10/2020 05:17 AM    Glucose 95 06/07/2020 02:57 PM    BUN 14 06/07/2020 02:57 PM    Creatinine 0.78 06/07/2020 02:57 PM    BUN/Creatinine ratio 18 06/07/2020 02:57 PM    GFR est AA 83 06/07/2020 02:57 PM    GFR est non-AA 72 06/07/2020 02:57 PM    Calcium 9.4 06/07/2020 02:57 PM       Review of most recent LFTs (and lipase if done)  Lab Results   Component Value Date/Time    ALT (SGPT) 15 06/07/2020 02:57 PM    AST (SGOT) 14 06/07/2020 02:57 PM    Alk. phosphatase 64 06/07/2020 02:57 PM    Bilirubin, total 0.6 06/07/2020 02:57 PM     No results found for: LPSE    Lab Results   Component Value Date/Time    INR 1.0 05/02/2020 12:11 PM    aPTT 28.9 04/05/2020 11:46 AM       Review of most recent HgbA1c  Lab Results   Component Value Date/Time    Hemoglobin A1c 6.1 (H) 06/07/2020 02:57 PM       Nutritional assessment screen for wound healing issues:  Lab Results   Component Value Date/Time    Protein, total 6.6 06/07/2020 02:57 PM    Albumin 4.2 06/07/2020 02:57 PM       '@lastcovr' @  XR Results (most recent):  Results from Hospital Encounter encounter on 05/07/20    XR CHEST SNGL V    Narrative  EXAMINATION: One view chest    HISTORY: post op lobectomy    TECHNIQUE: Frontal chest.    COMPARISON: 05/09/2020    FINDINGS:  Stable right  chest tube.    Persistent mild bibasilar lung infiltrates.    There may be a small right apical pneumothorax.    The right cardiophrenic angle is obscured by overlying leads.    There is no pleural effusion. The heart is unchanged.    No  other significant interval changes.    Impression  1. Findings as described above.      CT Results (most recent):  Results from Hospital Encounter encounter on 04/16/20    CT BX LUNG NDL PERC W IMAGING    Narrative  PROCEDURE: CT-guided Core Biopsy.    Procedural Personnel  Attending physician(s): Vladimir Creeks, M.D.    Pre-procedure diagnosis: Pleasant 79 year old woman with newly diagnosed mass in  the posterior right lower lobe of the lung.  History of tobacco use.  Chronic  obstructive pulmonary disease.  Post-procedure diagnosis: Same  Indication: Histopathologic diagnosis  Previous biopsy of same target (QCDR): No    Complications: No immediate complications.    Impression  CT-guided Core biopsy of right lower lobe pulmonary mass.    Plan:  Specimen(s) sent to the Pathology department for evaluation.  4 hours  bedrest.  Chest x-ray in 3 hours.  _______________________________________________________________  Technique:  All CT scans at this facility are performed using dose  reduction/dose modulation techniques, as appropriate to the performed exam,  including the following:  Automated Exposure Control; Adjustment of the MA  and/or kF according to patient size (this includes techniques or standardized  protocols for targeted exams where dose is matched to indication/reason for  exam); and Use of Iterative Reconstruction Technique.    PROCEDURE SUMMARY:  - Percutaneous CT-guided Coaxial Core Needle Biopsy  - Additional procedure(s): BioSentry needle tract occlusion    PROCEDURE DETAILS:    Pre-procedure  Reference imaging for biopsy target: PET/CT 04/02/2020, CT 02/29/2020 and  11/14/2016 (imported from [place] after the biopsy was performed).    Consent:  Informed written  and oral consent for the procedure was obtained after  explanation of risks (including, but not limitted to:  hemorrhage, infection,  visceral injury, nondiagnostic biopsy) benefits and alternatives.  The patient's  questions were answered to their satisfaction.  They stated understanding and  requested that we proceed.    Final verification:  A time-out identifying the patient and planned procedure  was performed prior to this procedure.    Preparation: (MIPS) Maximal sterile barrier technique (including:  cap, mask,  sterile gown, sterile gloves, sterile sheet, hand hygiene, and cutaneous  antisepsis) was used.    Anesthesia/sedation  Level of anesthesia/sedation: Moderate sedation (conscious sedation)  Anesthesia/sedation administered by: Independent trained observer under  attending supervision with continuous monitoring of the patient?s level of  consciousness and physiologic status  Total intra-service sedation time (minutes): 18    Imaging prior to biopsy  The patient was positioned prone. Initial imaging was performed using  noncontrast CT.  Biopsy target:  - Maximal diameter (cm): 6  - Location: Posterior, inferior, right lower lobe of the lung.  Other findings: None    Biopsy  Local anesthesia was administered. Under CT guidance, the coaxial biopsy system  was advanced to the target and Core biopsy was performed.  Care was taken to  biopsy the periphery of this mass, in the location of FDG activity.  Coaxial needle: 19 gauge    Core needle biopsy device: [place]  Core needle size: 20 gauge  Number of core specimens: 4    Fine needle aspiration device: Not applicable  Fine needle size: Not applicable  Number of FNA specimens: Not applicable    On-site biopsy touch preparation: No  Additional sampling recommendations: Not applicable  Preliminary assessment of sample adequacy: Not applicable    Needle removal  The biopsy needle was removed and a sterile dressing  was applied.  Tract embolization:  BioSentry Tract Sealant System--self-expanding hydrogel plug    Imaging following biopsy  Immediate post-biopsy imaging was not performed.  Imaging modality: Not applicable.  Post-biopsy imaging findings: Not applicable    Contrast  Contrast agent: None  Contrast volume (mL): 0    Radiation Dose  CT dose length product (mGy-cm):  825.03    Additional Details  Additional description of procedure: None  Equipment details: None  Specimens removed: Pathology  Estimated blood loss (mL): Less than 10  Standardized report: [email or web address]    Attestation  Signer name: Vladimir Creeks, M.D.  I attest that I personally performed the entire procedure. I reviewed the stored  images and agree with the report as written.    [place] Results (most recent):  Results from Abstract encounter on 07/29/16    US BREAST RT COMPLETE 4 QUAD        Admission date (for inpatients): 06/21/2020   Day of Surgery  Procedure(s):  PORT PLACEMENT        ASSESSMENT/PLAN:  Problem List  Date Reviewed: 2020-06-15          Codes Class Noted    Adenocarcinoma of right lung, stage 2 ([place]) ICD-10-CM: C34.91  ICD-9-CM: 162.9  June 15, 2020        Side effect of medication ICD-10-CM: T88.7XXA  ICD-9-CM: 995.20  2020/06/15        Large cell carcinoma of lung, right ([place]) ICD-10-CM: C34.91  ICD-9-CM: 162.9  05/07/2020        Bronchogenic cancer of right lung ([place]) ICD-10-CM: C34.91  ICD-9-CM: 162.9  04/28/2020        Right lower lobe lung mass ICD-10-CM: R91.8  ICD-9-CM: 786.6  03/20/2020        Dry skin dermatitis ICD-10-CM: L85.3  ICD-9-CM: 692.89  01/03/2020        Bilateral leg cramps ICD-10-CM: R25.2  ICD-9-CM: 729.82  01/03/2020        Elevated blood sugar ICD-10-CM: R73.9  ICD-9-CM: 790.29  01/03/2020        Hyperlipidemia ICD-10-CM: E78.5  ICD-9-CM: 272.4  01/04/2019        Chronic obstructive pulmonary disease ([place]) ICD-10-CM: J44.9  ICD-9-CM: 401  01/04/2019        Pre-ulcerative corn or callous ICD-10-CM: L84  ICD-9-CM: 700  06/25/2018        Chronic pain of left  knee ICD-10-CM: M25.562, G89.29  ICD-9-CM: 719.46, 338.29  01/26/2018    Overview Signed 02/29/2020 12:10 PM by Selinda Orion, NP     Last Assessment & Plan:   Formatting of this note might be different from the original.  Discussed in detail today.  We will see if we can get Visco therapy approved.  Referral to PT.             Prediabetes ICD-10-CM: R73.03  ICD-9-CM: 790.29  06/10/2016        Chronic right-sided low back pain without sciatica ICD-10-CM: M54.50, G89.29  ICD-9-CM: 724.2, 338.29  02/12/2016        Environmental allergies ICD-10-CM: Z91.09  ICD-9-CM: V15.09  10/05/2014        Rheumatoid arthritis involving multiple sites with positive rheumatoid factor (HCC) ICD-10-CM: M05.79  ICD-9-CM: 714.0  01/30/2014    Overview Signed 02/29/2020 12:10 PM by Selinda Orion, NP     Last Assessment & Plan:   Formatting of this note might be different from the original.  Chronic/Stable. Denies any recent RA flares. Patient is taking MTX and  folic acid. Denies any adverse side effects. Continue with current treatment plan. Reviewed previous labs. Ordered labs today. Will continue to monitor over time.    Instructed patient to contact our office with any new or worsening symptoms.             Hypothyroidism ICD-10-CM: E03.9  ICD-9-CM: 244.9  01/30/2014        Vitamin D deficiency ICD-10-CM: E55.9  ICD-9-CM: 268.9  01/30/2014    Overview Signed 02/29/2020 12:10 PM by Selinda Orion, NP     Last Assessment & Plan:   Formatting of this note might be different from the original.  Encouraged appropriate vitamin D supplementation. Monitor for signs or symptoms of hypercalcemia.             Osteoporosis ICD-10-CM: M81.0  ICD-9-CM: 733.00  01/30/2014        Gastroesophageal reflux disease without esophagitis ICD-10-CM: K21.9  ICD-9-CM: 530.81  01/30/2014        HTN (hypertension) ICD-10-CM: I10  ICD-9-CM: 401.9  01/30/2014            Active Problems:    * No active hospital problems. *     Place port.         I have personally  performed a face-to-face diagnostic evaluation and management  service on this patient.      I have independently seen the patient.   I have independently obtained the above history from the patient/family.    I have independently examined the patient with above findings.  I have independently reviewed data/labs for this patient and developed the above plan of care (MDM).  Signed: Thayer Jew, MD, FACS

## 2020-06-21 NOTE — Op Note (Signed)
Brief Postoperative Note    Patient: Audrey Snow  Date of Birth: 23-Apr-1941  MRN: [id number]    Date of Procedure: 06/21/2020     Pre-Op Diagnosis: Primary large cell carcinoma of lung, right ([place]) [C34.91]    Post-Op Diagnosis: Same as preoperative diagnosis.      Procedure(s):  PORT PLACEMENT with fluoro    Surgeon(s):  Thayer Jew, MD    Surgical Assistant: None    Anesthesia: MAC     Estimated Blood Loss (mL): Minimal    Complications: None    Specimens: * No specimens in log *     Implants:   Implant Name Type Inv. Item Serial No. Manufacturer Lot No. LRB No. Used Action   PORT ATTACH CATH POWERPORT 8FR --  - [id number]  PORT ATTACH CATH POWERPORT 8FR --   [employer]WD [id number] Left 1 Implanted       Drains: * No LDAs found *    Findings: fluoro confirmed positioning    Electronically Signed by Thayer Jew, MD on 06/21/2020 at 9:27 AM

## 2020-06-21 NOTE — Anesthesia Pre-Procedure Evaluation (Signed)
Anesthetic History   No history of anesthetic complications            Review of Systems / Medical History  Patient summary reviewed and pertinent labs reviewed    Pulmonary    COPD (PFTs: Moderate obstruction with air trapping and moderately reduced diffusion capacity c/w COPD)            Comments: Right Lower Lobe Lung CA  Uses oxygen at home regularly   Neuro/Psych   Within defined limits           Cardiovascular    Hypertension: well controlled        Dysrhythmias : PVC      Exercise tolerance: <4 METS  Comments: Stress Test 10/2019 in [place] showed normal LVEF and low risk scan.    EKG showing SR with PVCs   GI/Hepatic/Renal     GERD: well controlled           Endo/Other      Hypothyroidism: well controlled  Arthritis (OA and RA) and cancer (Bronchogenic Carcinoma of Right Lung)     Other Findings   Comments: Patient is very [facility]           Physical Exam    Airway  Mallampati: II  TM Distance: 4 - 6 cm  Neck ROM: normal range of motion   Mouth opening: Normal     Cardiovascular  Regular rate and rhythm,  S1 and S2 normal,  no murmur, click, rub, or gallop  Rhythm: regular  Rate: normal      Pertinent negatives: No murmur   Dental    Dentition: Caps/crowns and Poor dentition     Pulmonary      Decreased breath sounds: bilateral           Abdominal  GI exam deferred       Other Findings            Anesthetic Plan    ASA: 3  Anesthesia type: total IV anesthesia          Induction: Intravenous  Anesthetic plan and risks discussed with: Patient and Spouse

## 2020-06-21 NOTE — Progress Notes (Signed)
Chaplain's pre-procedure visit and prayer with patient as requested.    Adrian Duckett, MDiv, BS  Board Certified Chaplain

## 2020-06-21 NOTE — Interval H&P Note (Signed)
0934    XR called.    10:11 AM  Discharge instructions given to Daryel November, patient's husband--verbalized understanding and was given opportunity for questions.     10:14 AM  Whitney MD called and notified of pt c/o right sided abd/flank pain.    10  Whitney MD at bedside to speak with patient. Rx for 5 mg oxycodone given. XR ok, may discharge now.

## 2020-06-21 NOTE — Op Note (Signed)
[facility]  OPERATIVE REPORT    Name:  Audrey Snow, LEVEN  MR#:  [id number]  DOB:  [birth year]  ACCOUNT #:  [id number]  DATE OF SERVICE:  06/21/2020    PREOPERATIVE DIAGNOSIS:  Lung cancer.    POSTOPERATIVE DIAGNOSIS:  Lung cancer.    PROCEDURE PERFORMED:  Port-A-Cath placement under fluoroscopy.    SURGEON:  Thayer Jew, MD    ANESTHESIA:  MAc    COMPLICATIONS: none    SPECIMENS REMOVED:  none    IMPLANTS: as above    ESTIMATED BLOOD LOSS:  Minimum.    INDICATION:  This is an 79 year old female who presented with lung cancer.  She had a successful lung surgery recently.  She needed a port for adjuvant chemotherapy.  She understood risks and benefits, agreed to proceed.    FINDINGS:  Intraoperative fluoroscopy confirmed positioning of port placement.    PROCEDURE:  After informed consent obtained, the patient brought into the operating room.  Managed anesthesia care was administered.  The patient's upper chest and neck area were prepped and draped in routine fashion.  The left subclavian vein was accessed.  Venous blood return was obtained.  Guidewire was then advanced under the guidance of fluoroscopy followed by a dilator introducer sheath.  Then, the catheter was advanced along the introducer sheath under the guidance of fluoroscopy.  At the same time, pocket was made at the left upper chest.  The catheter was tunneled through the pocket, connected to a reservoir.  Both reservoir and the catheter were flushed with heparin solution 100 units/mL with good aspiration and good flush.  Then, the port was sutured into place with 2-0 Prolene stitch.  Incision closed with 3-0 Vicryl stitch on dermal layer, 4-0 Vicryl stitch in subcuticular running fashion.  The patient tolerated the procedure well, transferred to recovery room in stable condition.  All the instrument count and lap count were correct.      Thayer Jew, MD      [id number]  D:  06/21/2020 9:33  T:  06/21/2020 10:22  JOB #:  [id number]

## 2020-06-26 ENCOUNTER — Encounter: Attending: Internal Medicine | Primary: Internal Medicine

## 2020-06-27 ENCOUNTER — Ambulatory Visit: Attending: Nurse Practitioner | Primary: Internal Medicine

## 2020-06-27 ENCOUNTER — Ambulatory Visit: Admit: 2020-06-27 | Discharge: 2020-06-27 | Payer: MEDICARE | Attending: Nurse Practitioner | Primary: Internal Medicine

## 2020-06-27 DIAGNOSIS — J449 Chronic obstructive pulmonary disease, unspecified: Secondary | ICD-10-CM

## 2020-06-27 NOTE — Telephone Encounter (Signed)
Patient was in this morning told to call back with 02 companys name. [employer] [phone]    Patient has called back to let Jenny Reichmann know that she has been using the 02 to sleep.  Asked that I pass this along

## 2020-06-27 NOTE — Progress Notes (Signed)
Progress Notes by Georgiana Shore, NP at 06/27/20 [phone]                Author: Georgiana Shore, NP  Service: --  Author Type: Nurse Practitioner       Filed: 06/27/20 0102  Encounter Date: 06/27/2020  Status: Signed          Editor: Georgiana Shore, NP (Nurse Practitioner)                                  [address]   [place] [address]   [phone]            Name:  Audrey Snow   Date of Birth:  20-Mar-1941   MRN:  [id number]      Office visit   06/27/2020           Chief Complaint       Patient presents with        ?  COPD        ?  Lung Mass                 HISTORY OF PRESENT ILLNESS:   The patient is an 79 year old female who is seen for follow up of COPD and??6??cm RLL mass. ??The patient has has a history of vitamin D deficiency, hypothyroidism,  RA (Dr. Alexandria Lodge MTX), osteoporosis, HTN, extreme hearing loss, GERD, and COPD. ??She is accompanied by her husband who assists with the history due to her severe hearing loss.  She has previously been followed by [facility] pulmonary for COPD. ??She  has a 21 pack year history of cigarette smoking, but quit in 1985. 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 Trina Ao. ??She had a near syncopal episode in July which led to a CT of  chest which demonstrated an incidental finding of a 5 cm RLL mass. ??She has seen cardiology at [facility] and had Holter monitor and stress test (poor exercise capacity). ??Denies any fever, chills, or night sweats. ??Weight has been stable. ??No  hemoptysis. ??   ??   Had IR biopsy of right lung mass which revealed adenocarcinoma.  Was presented at tumor board--recommendation was for surgical resection and adjuvant chemotherapy.  Had right VATS and RLLobectomy, mediastinal lymphadenctomy, and cryoablation of intercostal  nerves on 05/07/20 by Dr. Sheria Lang.  Had port placed 07/04/20.  Has seen Dr. Jenetta Loges and will be starting chemo soon.      Was discharged with O2 to  use prn.  Has not been wearing it on a regular basis.  Feels well.  No cough or wheezing.  Reports mild DOE after walking long distances.??               ASSESSMENT/PLAN:  (Medical Decision Making)   Below is the assessment and plan developed based on review of pertinent history, physical exam, labs, studies, and medications.           Encounter Diagnoses        Name  Primary?         ?  COPD, moderate ([place])   --continue Anoro.  Yes     ?  Adenocarcinoma of right lung ([place])   --S/P RLLobectomy and is scheduled to start chemo soon.           ?  Hypoxemia   --continue O2 at 2 lpm with exertion if walking more than the distance of room to room.   --ONO on room air to determine nocturnal needs.   --husband is to call us back with name of DME company.                     Orders Placed This Encounter        ?  RT--OVERNIGHT OXIMETRY             Please send out Overnight Oximetry on Room Air   Please Fax results to: [phone]      Please use [facility]!              Standing Status:    Future              Standing Expiration Date:    12/26/2020        Follow up with me 6 months with spirometry.      Collaborating physician is Dr. Annamarie Major.      SH                Georgiana Shore, NP   Electronically signed               ~~~~~~~~~~~~~~~~~~~~~~~~~~~~~~~~~~~~~~~~   REFERENCE INFORMATION           Past Medical History:        Diagnosis  Date         ?  Arrhythmia  04/05/2020          PVCs.  04/05/20 was referred to [facility] for tachycardia, was seen 05/01/20 [facility]         ?  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  Autoimmune disease ([place])            RA         ?  Bronchogenic cancer of right lung ([place])  04/28/2020          adenocarcinoma         ?  Chronic obstructive pulmonary disease (HCC)            uses inhalers         ?  Chronic pain of left knee  06/25/2018     ?  Coronary atherosclerosis            noted on chest  CT         ?  COVID-19 vaccine series completed  08/15/2019          Pfizer; 07/29/2019 and booster given 03/12/2020         ?  GERD (gastroesophageal reflux disease)            no meds at this time         ?  H/O cardiovascular stress test            Bruce protocol, low risk study, EF of 67% and normal LV function         ?  H/O mammogram  07/28/2016          mammogram and Ultrasound of right breaset- benign finding- [facility]          ?  Hearing loss            "only hears out of  left ear"         ?  Hearing loss of both ears            no hearing aids          ?  HTN (hypertension), benign  01/30/2014          controlled w/med         ?  Hypothyroid            on med for control         ?  Menopause       ?  Osteoarthritis       ?  Osteoporosis            prolia          ?  Poor historian            patient had trouble remembering names of meds and what they were for and had to repeat instructions for surgery several times          ?  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 Debbra Riding. Methotrexate Rx         ?  Sinusitis            sinus flonase          ?  Thymoma, malignant ([place])  2014          B type Dr. Nancie Neas         ?  Unspecified hypothyroidism  01/30/2014          thyroid medication         ?  Vitamin D deficiency            medications for this              Past Surgical History:         Procedure  Laterality  Date          ?  COLONOSCOPY  N/A  01/27/2018          COLONOSCOPY/BMI 26 performed by Azalia Bilis, MD at [facility]          ?  HX CARPAL TUNNEL RELEASE  Bilateral       ?  HX COLONOSCOPY              colon polyps-adenomatous, has seen Dr. Kristopher Glee in past          ?  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  HX HEMORRHOIDECTOMY         ?  HX OTHER SURGICAL    09/2012          sinus surgery-Dr. Mickle Mallory          ?  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  HX TONSILLECTOMY    79 yrs old     ?  PR COLONOSCOPY  FLX DX W/COLLJ SPEC WHEN PFRMD    01/27/2018                        Family History         Problem  Relation  Age of Onset          ?  OSTEOARTHRITIS  Mother       ?  Alzheimer's Disease  Father            ?  Breast Cancer  Neg Hx               Social History          Socioeconomic History         ?  Marital status:  MARRIED              Spouse name:  Not on file         ?  Number of children:  Not on file     ?  Years of education:  Not on file     ?  Highest education level:  Not on file       Occupational History        ?  Not on file       Tobacco Use         ?  Smoking status:  Former Smoker              Packs/day:  0.50         Years:  43.00         Pack years:  21.50         Types:  Cigarettes         Start date:  1961         Quit date:  1985         Years since quitting:  36.9         ?  Smokeless tobacco:  Never Used        ?  Tobacco comment: not regular when first started- 1pk/week       Vaping Use         ?  Vaping Use:  Never used       Substance and Sexual Activity         ?  Alcohol use:  No              Alcohol/week:  0.0 standard drinks         ?  Drug use:  No     ?  Sexual activity:  Not on file        Other Topics  Concern        ?  Not on file       Social History Narrative          Married and lives with husband.  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          Social Determinants of Health          Financial Resource Strain:         ?  Difficulty of Paying Living Expenses: Not on file       Food Insecurity:         ?  Worried About Running Out of Food in the Last Year: Not on file     ?  Ran Out of Food in the Last Year: Not on file       Transportation Needs:         ?  Lack of Transportation (Medical): Not on file     ?  Lack of Transportation (Non-Medical): Not on file       Physical Activity:         ?  Days of Exercise per Week: Not on file     ?  Minutes of Exercise per Session: Not on file       Stress:         ?  Feeling of Stress : Not on file       Social Connections:         ?   Frequency of Communication with Friends and Family: Not on file     ?  Frequency of Social Gatherings with Friends and Family: Not on file     ?  Attends Religious Services: Not on file     ?  Active Member of Clubs or Organizations: Not on file     ?  Attends Archivist Meetings: Not on file     ?  Marital Status: Not on file       Intimate Partner Violence:         ?  Fear of Current or Ex-Partner: Not on file     ?  Emotionally Abused: Not on file     ?  Physically Abused: Not on file     ?  Sexually Abused: Not on file       Housing Stability:         ?  Unable to Pay for Housing in the Last Year: Not on file     ?  Number of Places Lived in the Last Year: Not on file        ?  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[facility])  ICD-10-CM: C34.91   ICD-9-CM: 162.9    05/07/2020                       Bronchogenic cancer of right lung ([place])  ICD-10-CM: C34.91   ICD-9-CM: 162.9    04/28/2020                       Right lower lobe lung mass  ICD-10-CM: R91.8   ICD-9-CM: 786.6    03/20/2020                       Dry skin dermatitis  ICD-10-CM: L85.3   ICD-9-CM: 692.89    01/03/2020                       Bilateral leg cramps  ICD-10-CM: R25.2   ICD-9-CM: 729.82    01/03/2020                       Elevated blood sugar  ICD-10-CM: R73.9   ICD-9-CM: 790.29    01/03/2020  Hyperlipidemia  ICD-10-CM: E78.5   ICD-9-CM: 272.4    01/04/2019                       Chronic obstructive pulmonary disease ([place])  ICD-10-CM: J44.9   ICD-9-CM: 742    01/04/2019                       Pre-ulcerative corn or callous  ICD-10-CM: L84   ICD-9-CM: 700     06/25/2018                       Chronic pain of left knee  ICD-10-CM: M25.562, G89.29   ICD-9-CM: 719.46, 338.29    01/26/2018          Overview Signed 02/29/2020 12:10 PM by Selinda Orion, NP            Last Assessment & Plan:    Formatting of this note might be different from the original.   Discussed in detail today.  We will see if we can get Visco therapy approved.  Referral to PT.                                   Prediabetes  ICD-10-CM: R73.03   ICD-9-CM: 790.29    06/10/2016                       Chronic right-sided low back pain without sciatica  ICD-10-CM: M54.50, G89.29   ICD-9-CM: 724.2, 338.29    02/12/2016                       Environmental allergies  ICD-10-CM: Z91.09   ICD-9-CM: V15.09    10/05/2014                       Rheumatoid arthritis involving multiple sites with positive rheumatoid factor (HCC)  ICD-10-CM: M05.79   ICD-9-CM: 714.0    01/30/2014          Overview Signed 02/29/2020 12:10 PM by Selinda Orion, NP            Last Assessment & Plan:    Formatting of this note might be different from the original.   Chronic/Stable. Denies any recent RA flares. Patient is taking MTX and folic acid. Denies any adverse side effects. Continue with current treatment plan. Reviewed previous labs. Ordered labs today. Will continue to monitor over time.      Instructed patient to contact our office with any new or worsening symptoms.                                   Hypothyroidism  ICD-10-CM: E03.9   ICD-9-CM: 244.9    01/30/2014                       Vitamin D deficiency  ICD-10-CM: E55.9   ICD-9-CM: 268.9    01/30/2014          Overview Signed 02/29/2020 12:10 PM by Selinda Orion, NP            Last Assessment & Plan:    Formatting of this note might be different from the original.   Encouraged appropriate vitamin D supplementation. Monitor  for signs or symptoms of hypercalcemia.                                   Osteoporosis  ICD-10-CM: M81.0   ICD-9-CM: 733.00    01/30/2014                        Gastroesophageal reflux disease without esophagitis  ICD-10-CM: K21.9   ICD-9-CM: 530.81    01/30/2014                       HTN (hypertension)  ICD-10-CM: I10   ICD-9-CM: 401.9    01/30/2014                               Allergies        Allergen  Reactions         ?  Yeast  Nausea and Vomiting             Pt. Says she hasnt had issues with this in a long time             Current Outpatient Medications        Medication  Sig         ?  magnesium 250 mg tab  Take  by mouth daily.     ?  OTHER,NON-FORMULARY,  daily. Prevagen     ?  folic acid (FOLVITE) 1 mg tablet  Take 1 Tablet by mouth daily.     ?  lidocaine-prilocaine (EMLA) topical cream  Apply  to affected area as needed for Pain.     ?  umeclidinium-vilanteroL (Anoro Ellipta) 62.5-25 mcg/actuation inhaler  Take 1 Puff by inhalation daily. (Patient taking differently: Take 1 Puff by inhalation as needed.)     ?  metoprolol succinate (TOPROL-XL) 50 mg XL tablet  Take 50 mg by mouth daily.     ?  atorvastatin (LIPITOR) 20 mg tablet  Take 20 mg by mouth daily.     ?  ergocalciferol (ERGOCALCIFEROL) 1,250 mcg (50,000 unit) capsule  Take 50,000 Units by mouth every fourteen (14) days.     ?  mupirocin (BACTROBAN) 2 % ointment  Apply  to affected area daily. (Patient taking differently: Apply  to affected area as needed.)     ?  levothyroxine (SYNTHROID) 100 mcg tablet  Take 1 Tablet by mouth Daily (before breakfast). (Patient taking differently: Take 125 mcg by mouth Daily (before breakfast).)     ?  gabapentin (NEURONTIN) 100 mg capsule  Take 100 mg by mouth three (3) times daily as needed.     ?  albuterol (PROVENTIL HFA, VENTOLIN HFA, PROAIR HFA) 90 mcg/actuation inhaler  Take 1 Puff by inhalation every six (6) hours as needed for Wheezing.     ?  losartan (COZAAR) 100 mg tablet  TAKE 1 TABLET BY MOUTH DAILY     ?  fluticasone propionate (FLONASE) 50 mcg/actuation nasal spray  2 Sprays by Both Nostrils route daily. (Patient taking differently: 2 Sprays by Both  Nostrils route daily as needed.)     ?  denosumab (PROLIA) 60 mg/mL injection  60 mg by SubCUTAneous route every 6 months. Due December 2021     ?  methotrexate, PF, 25 mg/mL injection  25 mg every seven (7) days. Friday evening     ?  nicotinic acid (NIACIN) 500 mg tablet  Take 500 mg by mouth Daily (before breakfast). 2 po everyday.          ?  multivitamin (ONE A DAY) tablet  Take 1 Tab by mouth daily.          No current facility-administered medications for this visit.                 Review of Systems    Constitutional: Negative for chills, diaphoresis, fever, malaise/fatigue and weight loss.    Respiratory: Negative for cough, hemoptysis, sputum production, shortness of breath and wheezing.     Cardiovascular: Negative for chest pain, palpitations, claudication, leg swelling and PND.    Gastrointestinal: Negative for abdominal pain, constipation, diarrhea, heartburn, nausea and vomiting.    Neurological: Negative for dizziness, tremors, seizures, weakness and headaches.                PHYSICAL EXAM:        Vitals:          06/27/20 0911        BP:  110/80     Pulse:  (!) 109     Temp:  98 ??F (36.7 ??C)     TempSrc:  Temporal     Resp:  20     Height:  5\' 6"  (1.676 m)     Weight:  155 lb (70.3 kg)        SpO2:  97%   Comment: Room Air        Body mass index is 25.02 kg/m??.         GENERAL APPEARANCE:  The patient is normal weight and in no respiratory distress.   HEENT:  PERRL.  Conjunctivae unremarkable.  Nasal mucosa is without epistaxis, exudate, or polyps.  Gums and dentition are unremarkable.  There  is no oropharyngeal narrowing.  TMs are clear.   NECK/LYMPHATIC:  Symmetrical with no elevation of jugular venous pulsation.  Trachea midline. No thyroid enlargement.  No cervical adenopathy.   LUNGS:  Normal respiratory effort with symmetrical lung expansion.   Breath sounds minimally decreased bilaterally, but clear.   HEART:  There is a regular rate and rhythm.  No murmur, rub, or gallop.  There is no edema  in the lower extremities.   ABDOMEN:  Soft and non-tender.  No hepatosplenomegaly.  Bowel sounds are normal.     NEURO:  The patient is alert and oriented to person, place, and time.  Memory appears intact and mood is normal.  No gross sensorimotor deficits  are present.         DIAGNOSTIC TESTS:    PCXR: Results for orders placed during the hospital encounter of 05/07/20   Narrative   Portable chest x-ray      Clinical indication: Status post right-sided lung surgery      FINDINGS: Single AP view the chest compared to a preoperative exam dated   05/02/2020 shows removal of the right lower lung mass. Two chest tubes are in   place. Thoracotomy staples are present. There is mild atelectasis at the right   lung base. There is no pneumothorax.      Impression   Status post resection of a right lung mass without obvious   complication with mild atelectasis at the right lung base.          CXR PA and lateral:  Results for orders placed during the hospital encounter of 05/02/20   Narrative  PA AND LATERAL CHEST X-RAY.      Clinical Indication: Preoperative evaluation for lung mass      Comparison: Chest x-ray dated 04/16/2020      Findings: 2 views of the chest submitted demonstrate a large posterior right   lower lobe lung mass that measures 7.8 x 6.2 cm. No additional masses   appreciated. The lungs are clear. There is no pleural effusion. The cardiac   silhouette and mediastinum are unremarkable.      Impression   Large 7.8 cm right posterior lower lobe lung mass. No acute   abnormality evident.           CT of chest without contrast:   Results for orders placed during the hospital encounter of 02/29/20   Narrative   NONCONTRAST CHEST CT      CLINICAL HISTORY:  Follow-up abnormal chest x-ray with right lung mass an   79 year old former smoker with a 20 pack-year history.      TECHNIQUE:  Without contrast administration, the chest was scanned with spiral   technique.  Radiation dose reduction was achieved using one or  all of the   following techniques: automated exposure control, weight-based dosing, iterative   reconstruction.      COMPARISON:  Radiographs dated February 27, 2020 from [facility] without images for   direct comparison.      FINDINGS:  Again noted is a 5 cm mass with subjacent pleural lobularity   posteriorly in the right lower lobe which is not typical of rounded pneumonia   and is suspicious for bronchogenic carcinoma in this clinical setting.  No   pathologically enlarged lymph nodes or abnormal fluid collection is evident.  No   other definite pulmonary nodule is statistically most from scattered irregular   linear densities superimposed upon centrilobular emphysema.  The epigastrium   appears unremarkable as imaged.  Bone windows demonstrate no definite aggressive   process, accounting for degenerative changes.      Impression   A 5 CM MASS WITH ADJACENT PLEURAL NODULARITY POSTERIORLY AT THE RIGHT LUNG BASE   MUST BE CONSIDERED SUSPICIOUS FOR BRONCHOGENIC CARCINOMA TILL PROVEN OTHERWISE   IN THIS 8-YEAR-OLD WITH EMPHYSEMA AND SIGNIFICANT SMOKING HISTORY.  THE AS   WOULD BE AMENABLE TO PERCUTANEOUS CORE NEEDLE BIOPSY WITH CT GUIDANCE.  IF   FURTHER IMAGING EVALUATION IS CLINICALLY INDICATED AT THIS TIME, THEN PET/CT   WOULD BE MOST USEFUL.                   PET/CT: Results for orders placed during the hospital encounter of 04/02/20   Narrative   EXAM: PET/CT TUMOR IMAGE SKULL THIGH W (INI)      INDICATION: highly suspicious for lung cancer.      PROCEDURE: Following IV injection of 13.91 mCi 18 Fluoro 2 deoxyglucose (FDG)   and a standard uptake delay, PET imaging is performed from head to thighs and   axial, sagittal and coronal images were acquired. Unenhanced CT is obtained for   anatomic localization, and attenuation correction of the PET scan. Patient   preprocedure blood glucose level: 1:15mg /dL.      CORRELATIVE IMAGING STUDIES: CT chest dated 02/29/2020.      COMPARISON PET: No prior PET/CT      HISTORY: The  study is requested for initial treatment strategy. Marland Kitchen      FINDINGS:      HEAD/NECK:   Mildly hypermetabolic nodule within the superficial left parotid gland, favored  to represent a lymph node. No other hypermetabolic cervical lymphadenopathy.   Cerebral evaluation is limited by normal intense activity.      CHEST:   Redemonstrated 6 cm pulmonary mass within the right lower lobe which   demonstrates peripheral nodular hypermetabolic activity (max SUV 8.9).   Additional nodular area inferiorly along the right posterior pleura which   demonstrates increased metabolic activity (max SUV 8.9). No hypermetabolic hilar   or mediastinal lymphadenopathy. Scattered calcified atherosclerotic disease      ABDOMEN/PELVIS:   No foci of abnormal hypermetabolism. Suspected physiologic uptake within the   gastrointestinal and genitourinary tracts.      SKELETON:   No foci of abnormal hypermetabolism in the axial and visualized appendicular   skeleton.      Impression   1.  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   2.  No hypermetabolic lymphadenopathy or distant metastatic disease.   3.  Mildly enlarged and metabolic left parotid lymph node, likely reactive in   etiology.              Exercise oximetry:   O2 sat on room air at rest is 95% and resting heart rate is 78 bpm.  After walking for 5 minutes, lowest O2 sat was  88% and maximum heart rate was 108 bpm.                   Georgiana Shore, NP   Electronically signed      Dictated using voice recognition software.  Proof read but unrecognized errors may exist.

## 2020-07-02 NOTE — Telephone Encounter (Signed)
 Previous ONO was sent to [employer] by Lucie, which seems to be not processed. Another ONO order on RA was sent to the correct DME company. The patient's chart has also been updated to have the name under FYI.

## 2020-07-04 ENCOUNTER — Ambulatory Visit: Attending: Family | Primary: Internal Medicine

## 2020-07-04 ENCOUNTER — Encounter: Admit: 2020-07-04 | Discharge: 2020-07-04 | Payer: MEDICARE | Primary: Internal Medicine

## 2020-07-04 ENCOUNTER — Ambulatory Visit: Admit: 2020-07-04 | Discharge: 2020-07-04 | Payer: MEDICARE | Attending: Family | Primary: Internal Medicine

## 2020-07-04 DIAGNOSIS — C3491 Malignant neoplasm of unspecified part of right bronchus or lung: Secondary | ICD-10-CM

## 2020-07-04 MED ORDER — PROCHLORPERAZINE MALEATE 10 MG TAB
10 mg | ORAL_TABLET | Freq: Four times a day (QID) | ORAL | 1 refills | Status: AC | PRN
Start: 2020-07-04 — End: ?

## 2020-07-04 MED ORDER — ONDANSETRON HCL 8 MG TAB
8 mg | ORAL_TABLET | Freq: Three times a day (TID) | ORAL | 1 refills | Status: AC | PRN
Start: 2020-07-04 — End: ?

## 2020-07-04 NOTE — Progress Notes (Signed)
Progress  Notes by Andrew Au, NP at 07/04/20 1000                Author: Andrew Au, NP  Service: --  Author Type: Nurse Practitioner       Filed: 07/04/20 1107  Encounter Date: 07/04/2020  Status: Signed          Editor: Roston Grunewald, Minus Breeding, NP (Nurse Practitioner)               IV Chemotherapy/Biotherapy Education:   Audrey Snow  is a 79 y.o. year  old female with lung cancer cancer who presents for chemotherapy education for the following medication(s): Cisplatin and Alimta. Schedule and  frequency of chemotherapy were discussed with the patient and spouse.       The patient was given handouts published by the [facility] titled "Chemotherapy and You" and "Eating Hints Before, During, and After Cancer Treatment" for reference.  Medication specific information was printed from [facility] and distributed  to the patient.      Self-care guidelines were distributed and discussed with the patient and included the following:      1) Potential long term and short term side effects of therapy including fertility risks for appropriate patients      2) Symptoms and side effects that require the patient to contact [place] or require immediate attention      3) Symptoms or events that require immediate discontinuation of oral or self-administered treatments      4) Procedures for handling medications at home, including storage, safe handling, and management of unused medications      5) Procedures for handling bodily fluids and waste in the home      6) [facility]'s contact information with availability and instructions on who and when to call      7) [facility] missed appointment policy and expectations for rescheduling or canceling      Patient denies any needs or referrals at this time.  Prescriptions for compazine, zofran, and folic acid have been sent electronically to the local pharmacy.  Proper use and frequency  of these meds were reviewed with patient and patient verbalized understanding  of the treatment recommendations.      Patient asked appropriate questions and was very involved and engaged during the educational session.  There were no barriers to learning that were observed or demonstrated during the encounter.  Preference in learning style assessed as visual, written  and verbal.  Patient acknowledged readiness to learn by responding appropriately to open ended questions about chemo education, disease process, treatment plan and possible side effects, etc.  Patient offered feedback on learning and the educational encounter.   Pt and spouse acknowledges the importance of and agrees to adhering to the treatment plan regimen.      Upcoming appointments for were reviewed with the patient.        Time was provided for the patient to ask questions.  Audrey Snow verbalized understanding of the treatment plan.      Vitals:     Visit Vitals      BP  136/75 (BP 1 Location: Left arm)        Pulse  (!) 58     Temp  97.9 ??F (36.6 ??C) (Oral)     Wt  155 lb 14.4 oz (70.7 kg)     SpO2  94%  BMI  25.16 kg/m??           Informed Consent for Administration of Chemotherapy or Biotherapy for Audrey Snow was signed and scanned into Epic under the Media tab.      Total time spent for chemo education/counseling: 30 minutes                  [place]    [address] [place] [address]   Office : [phone]   Fax : [phone]

## 2020-07-04 NOTE — Progress Notes (Signed)
I spoke with Audrey Snow regarding her Medicare A and B and [name] coverage, potential oral medication authorizations, enrollment in the [employer] (ACS) and the [place] ([place]), and Occupational psychologist. Patient would like to look over the societies.  Next, I spoke with Audrey Snow regarding her Medicare A and B and [name] coverage. The patient was given the [place] sheet which displayed her insurance benefits.  Next, I spoke with Audrey Snow regarding the Oncology Care Model Notification Letter. I answered questions regarding the costs associated with Medicare Benefits. I explained to Audrey Snow the estimated cost of treatment and services for six months under the [employer]. [name] was advised to contact Medicare or their healthcare provider for questions or concerns related to service of care.  The Oncology Care Model provides different options of contact for Audrey Snow regarding concerns and complaints of treatments and services.   Next, I spoke with Audrey Snow regarding her Cisplatin and Alimta injectable medications.  Next, I spoke with Audrey Snow about billing questions and treatment services.  We discussed copayments, deductibles, and out of pocket maximums.   Next, I spoke with Audrey Snow regarding the Limited Power of [place] document. Patient would like more time to look over the document  Next, I spoke with Audrey Snow regarding potential oral medication authorizations. I told her that if she ever had any problems getting her oral medications filled to give the dedicated [facility] financial coordinator a call. Most of the time, it is simply an authorization that needs to be done with the insurance company.  Lastly, I gave Audrey Snow a form with various resource organizations that could assist with specific needs (example:  transportation, lodging,  preparing meals, home cleaning)      Audrey Snow expressed understanding of the information above and all questions were answered to her satisfaction.

## 2020-07-09 ENCOUNTER — Encounter

## 2020-07-10 ENCOUNTER — Ambulatory Visit: Attending: Family | Primary: Internal Medicine

## 2020-07-10 ENCOUNTER — Inpatient Hospital Stay: Admit: 2020-07-10 | Payer: MEDICARE | Primary: Internal Medicine

## 2020-07-10 ENCOUNTER — Ambulatory Visit: Admit: 2020-07-10 | Discharge: 2020-07-10 | Payer: MEDICARE | Attending: Family | Primary: Internal Medicine

## 2020-07-10 DIAGNOSIS — C3491 Malignant neoplasm of unspecified part of right bronchus or lung: Secondary | ICD-10-CM

## 2020-07-10 LAB — CBC WITH AUTO DIFFERENTIAL
Basophils %: 1 % (ref 0.0–2.0)
Basophils Absolute: 0.1 10*3/uL (ref 0.0–0.2)
Eosinophils %: 1 % (ref 0.5–7.8)
Eosinophils Absolute: 0.1 10*3/uL (ref 0.0–0.8)
Granulocyte Absolute Count: 0.1 10*3/uL (ref 0.0–0.5)
Hematocrit: 40.4 % (ref 35.8–46.3)
Hemoglobin: 13.6 g/dL (ref 11.7–15.4)
Immature Granulocytes: 1 % (ref 0.0–5.0)
Lymphocytes %: 15 % (ref 13–44)
Lymphocytes Absolute: 1.6 10*3/uL (ref 0.5–4.6)
MCH: 31.5 PG (ref 26.1–32.9)
MCHC: 33.7 g/dL (ref 31.4–35.0)
MCV: 93.5 FL (ref 79.6–97.8)
MPV: 9.6 FL (ref 9.4–12.3)
Monocytes %: 13 % — ABNORMAL HIGH (ref 4.0–12.0)
Monocytes Absolute: 1.4 10*3/uL — ABNORMAL HIGH (ref 0.1–1.3)
NRBC Absolute: 0 10*3/uL (ref 0.0–0.2)
Neutrophils %: 69 % (ref 43–78)
Neutrophils Absolute: 7.8 10*3/uL (ref 1.7–8.2)
Platelets: 290 10*3/uL (ref 150–450)
RBC: 4.32 M/uL (ref 4.05–5.2)
RDW: 14.4 % (ref 11.9–14.6)
WBC: 11 10*3/uL (ref 4.3–11.1)

## 2020-07-10 LAB — COMPREHENSIVE METABOLIC PANEL
ALT: 15 U/L (ref 12–65)
AST: 13 U/L — ABNORMAL LOW (ref 15–37)
Albumin/Globulin Ratio: 0.8 — ABNORMAL LOW (ref 1.2–3.5)
Albumin: 3.6 g/dL (ref 3.2–4.6)
Alkaline Phosphatase: 65 U/L (ref 50–136)
Anion Gap: 9 mmol/L (ref 7–16)
BUN: 8 MG/DL (ref 8–23)
CO2: 27 mmol/L (ref 21–32)
Calcium: 9.3 MG/DL (ref 8.3–10.4)
Chloride: 98 mmol/L (ref 98–107)
Creatinine: 0.8 MG/DL (ref 0.6–1.0)
EGFR IF NonAfrican American: >60 mL/min/{1.73_m2} (ref >60)
GFR African American: >60 mL/min/{1.73_m2} (ref >60)
Globulin: 4.5 g/dL — ABNORMAL HIGH (ref 2.3–3.5)
Glucose: 124 mg/dL — ABNORMAL HIGH (ref 65–100)
Potassium: 3.4 mmol/L — ABNORMAL LOW (ref 3.5–5.1)
Sodium: 134 mmol/L — ABNORMAL LOW (ref 136–145)
Total Bilirubin: 1.1 MG/DL (ref 0.2–1.1)
Total Protein: 8.1 g/dL (ref 6.3–8.2)

## 2020-07-10 LAB — MAGNESIUM
Magnesium: 2.3 mg/dL (ref 1.8–2.4)
Magnesium: 2.3 mg/dL (ref 1.8–2.4)

## 2020-07-10 LAB — METABOLIC PANEL, COMPREHENSIVE
A-G Ratio: 0.8 — ABNORMAL LOW (ref 1.2–3.5)
ALT (SGPT): 15 U/L (ref 12–65)
AST (SGOT): 13 U/L — ABNORMAL LOW (ref 15–37)
Albumin: 3.6 g/dL (ref 3.2–4.6)
Alk. phosphatase: 65 U/L (ref 50–136)
Anion gap: 9 mmol/L (ref 7–16)
BUN: 8 MG/DL (ref 8–23)
Bilirubin, total: 1.1 MG/DL (ref 0.2–1.1)
CO2: 27 mmol/L (ref 21–32)
Calcium: 9.3 MG/DL (ref 8.3–10.4)
Chloride: 98 mmol/L (ref 98–107)
Creatinine: 0.8 MG/DL (ref 0.6–1.0)
GFR est AA: >60 mL/min/{1.73_m2} (ref >60)
GFR est non-AA: >60 mL/min/{1.73_m2} (ref >60)
Globulin: 4.5 g/dL — ABNORMAL HIGH (ref 2.3–3.5)
Glucose: 124 mg/dL — ABNORMAL HIGH (ref 65–100)
Potassium: 3.4 mmol/L — ABNORMAL LOW (ref 3.5–5.1)
Protein, total: 8.1 g/dL (ref 6.3–8.2)
Sodium: 134 mmol/L — ABNORMAL LOW (ref 136–145)

## 2020-07-10 LAB — CBC WITH AUTOMATED DIFF
ABS. BASOPHILS: 0.1 10*3/uL (ref 0.0–0.2)
ABS. EOSINOPHILS: 0.1 10*3/uL (ref 0.0–0.8)
ABS. IMM. GRANS.: 0.1 10*3/uL (ref 0.0–0.5)
ABS. LYMPHOCYTES: 1.6 10*3/uL (ref 0.5–4.6)
ABS. MONOCYTES: 1.4 10*3/uL — ABNORMAL HIGH (ref 0.1–1.3)
ABS. NEUTROPHILS: 7.8 10*3/uL (ref 1.7–8.2)
ABSOLUTE NRBC: 0 10*3/uL (ref 0.0–0.2)
BASOPHILS: 1 % (ref 0.0–2.0)
EOSINOPHILS: 1 % (ref 0.5–7.8)
HCT: 40.4 % (ref 35.8–46.3)
HGB: 13.6 g/dL (ref 11.7–15.4)
IMMATURE GRANULOCYTES: 1 % (ref 0.0–5.0)
LYMPHOCYTES: 15 % (ref 13–44)
MCH: 31.5 PG (ref 26.1–32.9)
MCHC: 33.7 g/dL (ref 31.4–35.0)
MCV: 93.5 FL (ref 79.6–97.8)
MONOCYTES: 13 % — ABNORMAL HIGH (ref 4.0–12.0)
MPV: 9.6 FL (ref 9.4–12.3)
NEUTROPHILS: 69 % (ref 43–78)
PLATELET: 290 10*3/uL (ref 150–450)
RBC: 4.32 M/uL (ref 4.05–5.2)
RDW: 14.4 % (ref 11.9–14.6)
WBC: 11 10*3/uL (ref 4.3–11.1)

## 2020-07-10 NOTE — Addendum Note (Signed)
Addendum  Note by Greer Ee, MD at 07/10/20 1130                Author: Greer Ee, MD  Service: --  Author Type: Physician       Filed: 07/10/20 1648  Encounter Date: 07/10/2020  Status: Signed          Editor: Greer Ee, MD (Physician)          Addended by: Greer Ee on: 07/10/2020 04:48 PM    Modules accepted: Orders

## 2020-07-10 NOTE — Progress Notes (Signed)
Progress Notes by Kerby Nora, NP at 07/10/20 1130                Author: Kerby Nora, NP  Service: --  Author Type: Nurse Practitioner       Filed: 07/12/20 1621  Encounter Date: 07/10/2020  Status: Addendum          Editor: Kerby Nora, NP (Nurse Practitioner)          Related Notes: Original Note by Kerby Nora, NP (Nurse Practitioner) filed at 07/10/20  [facility]: Office Visit Established Patient      Chief Complaint:       Chief Complaint       Patient presents with        ?  Follow-up              History of Present Illness:   Audrey Snow is a  80 y.o. female who presents today for follow-up regarding adenocarcinoma of trhe lung.   On 02/27/20, Audrey Snow presented to [facility] ED with complaints of achiness in chest with radiation of pain to her left upper extremity with association of N/V, SOB and diaphoresis. Cardiac work-up was negative; however her chest x-ray reported a 5.1 cm rounded  opacity at the right lung base.  She saw her PCP on 02/29/20, CT scan was ordered and reported a 5 cm mass with adjacent pleural nodularity posteriorly at the right lung base and considered suspicious for bronchogenic carcinoma. On 04/02/20 she had a PET/CT  scan that reported a 6 cm right lower lobe pulmonary mass with no metastatic disease.  Biopsy showed adenocarcinoma of lung origin.  We saw her prior to surgical evaluation and recommended proceeding with lobectomy and lymphadenectomy if she was a surgical  candidate.      She is here today for follow up and consideration of C1 of Cis/Alimta.  She has been okay overall since last seen.  She has had some shortness of breath/cough, feels she had a recent upper respiratory infection and resolving.  She has not been needing  any cough medicine, taking Mucinex and this is helping.  She had a x1 fever (low grade 100) Sunday night - no fevers since then.  She is on oxygen today, O2 sat 94% on oxygen today.  She  still is not requiring any pain medicine, occasional pain at post-surgical  site.  She denies any issues with appetite, no current GI or bowel complaints.  She had her port placed previously and also chemo education, no further questions regarding nausea/side effect management.             Review of Systems:   Constitutional: Negative.    HENT: Negative.    Eyes: Negative.    Respiratory: Negative.    Cardiovascular: Negative.    Gastrointestinal: Negative.    Genitourinary: Negative.    Musculoskeletal: Positive for right chest wall pain.    Skin: Negative.    Neurological: Negative.    Endo/Heme/Allergies: Negative.    Psychiatric/Behavioral: Negative.    All other systems reviewed and are negative.         Allergies        Allergen  Reactions         ?  Yeast  Nausea and Vomiting             Pt. Says she hasnt had issues with this  in a long time          Past Medical History:        Diagnosis  Date         ?  Arrhythmia  04/05/2020          PVCs.  04/05/20 was referred to [facility] for tachycardia, was seen 05/01/20 [facility]         ?  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  Autoimmune disease ([place])            RA         ?  Bronchogenic cancer of right lung ([place])  04/28/2020          adenocarcinoma         ?  Chronic obstructive pulmonary disease (HCC)            uses inhalers         ?  Chronic pain of left knee  06/25/2018     ?  Coronary atherosclerosis            noted on chest CT         ?  COVID-19 vaccine series completed  08/15/2019          Pfizer; 07/29/2019 and booster given 03/12/2020         ?  GERD (gastroesophageal reflux disease)            no meds at this time         ?  H/O cardiovascular stress test            Bruce protocol, low risk study, EF of 67% and normal LV function         ?  H/O mammogram  07/28/2016          mammogram and Ultrasound of right breaset- benign finding- [facility]          ?  Hearing loss             "only hears out of left ear"         ?  Hearing loss of both ears            no hearing aids          ?  HTN (hypertension), benign  01/30/2014          controlled w/med         ?  Hypothyroid            on med for control         ?  Menopause       ?  Osteoarthritis       ?  Osteoporosis            prolia          ?  Poor historian            patient had trouble remembering names of meds and what they were for and had to repeat instructions for surgery several times          ?  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 Debbra Riding. Methotrexate Rx         ?  Sinusitis            sinus flonase          ?  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 Nancie Neas         ?  Unspecified hypothyroidism  01/30/2014          thyroid medication         ?  Vitamin D deficiency            medications for this           Past Surgical History:         Procedure  Laterality  Date          ?  COLONOSCOPY  N/A  01/27/2018          COLONOSCOPY/BMI 26 performed by Azalia Bilis, MD at [facility]          ?  HX CARPAL TUNNEL RELEASE  Bilateral       ?  HX COLONOSCOPY              colon polyps-adenomatous, has seen Dr. Kristopher Glee in past          ?  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  HX HEMORRHOIDECTOMY         ?  HX OTHER SURGICAL    09/2012          sinus surgery-Dr. Mickle Mallory          ?  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  HX TONSILLECTOMY    80 yrs old     ?  PR COLONOSCOPY FLX DX W/COLLJ SPEC WHEN PFRMD    01/27/2018                     Family History         Problem  Relation  Age of Onset          ?  OSTEOARTHRITIS  Mother       ?  Alzheimer's Disease  Father            ?  Breast Cancer  Neg Hx            Social History          Socioeconomic History         ?  Marital status:  MARRIED              Spouse name:  Not on file         ?  Number of children:  Not on file     ?  Years of education:  Not on file     ?   Highest education level:  Not on file       Occupational History        ?  Not on file       Tobacco Use         ?  Smoking status:  Former Smoker              Packs/day:  0.50         Years:  43.00         Pack years:  21.50         Types:  Cigarettes  Start date:  29         Quit date:  61         Years since quitting:  37.0         ?  Smokeless tobacco:  Never Used        ?  Tobacco comment: not regular when first started- 1pk/week       Vaping Use         ?  Vaping Use:  Never used       Substance and Sexual Activity         ?  Alcohol use:  No              Alcohol/week:  0.0 standard drinks         ?  Drug use:  No     ?  Sexual activity:  Not on file        Other Topics  Concern        ?  Not on file       Social History Narrative          Married and lives with husband.  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          Social Determinants of Health          Financial Resource Strain:         ?  Difficulty of Paying Living Expenses: Not on file       Food Insecurity:         ?  Worried About Running Out of Food in the Last Year: Not on file     ?  Ran Out of Food in the Last Year: Not on file       Transportation Needs:         ?  Lack of Transportation (Medical): Not on file     ?  Lack of Transportation (Non-Medical): Not on file       Physical Activity:         ?  Days of Exercise per Week: Not on file     ?  Minutes of Exercise per Session: Not on file       Stress:         ?  Feeling of Stress : Not on file       Social Connections:         ?  Frequency of Communication with Friends and Family: Not on file     ?  Frequency of Social Gatherings with Friends and Family: Not on file     ?  Attends Religious Services: Not on file     ?  Active Member of Clubs or Organizations: Not on file     ?  Attends Archivist Meetings: Not on file     ?  Marital Status: Not on file       Intimate Partner Violence:         ?  Fear of Current or Ex-Partner: Not on file     ?  Emotionally Abused: Not on file      ?  Physically Abused: Not on file     ?  Sexually Abused: Not on file       Housing Stability:         ?  Unable to Pay for Housing in the Last Year: Not on file     ?  Number of Places Lived in the Last  Year: Not on file        ?  Unstable Housing in the Last Year: Not on file          Current Outpatient Medications          Medication  Sig  Dispense  Refill           ?  ondansetron hcl (ZOFRAN) 8 mg tablet  Take 1 Tablet by mouth every eight (8) hours as needed for Nausea or Vomiting.  90 Tablet  1     ?  prochlorperazine (Compazine) 10 mg tablet  Take 1 Tablet by mouth every six (6) hours as needed for Nausea or Vomiting.  90 Tablet  1     ?  magnesium 250 mg tab  Take  by mouth daily.         ?  OTHER,NON-FORMULARY,  daily. Prevagen         ?  folic acid (FOLVITE) 1 mg tablet  Take 1 Tablet by mouth daily.  90 Tablet  3     ?  lidocaine-prilocaine (EMLA) topical cream  Apply  to affected area as needed for Pain.  30 g  0     ?  umeclidinium-vilanteroL (Anoro Ellipta) 62.5-25 mcg/actuation inhaler  Take 1 Puff by inhalation daily. (Patient taking differently: Take 1 Puff by inhalation as needed.)  1 Each  11     ?  atorvastatin (LIPITOR) 20 mg tablet  Take 20 mg by mouth daily.         ?  ergocalciferol (ERGOCALCIFEROL) 1,250 mcg (50,000 unit) capsule  Take 50,000 Units by mouth every fourteen (14) days.         ?  mupirocin (BACTROBAN) 2 % ointment  Apply  to affected area daily. (Patient taking differently: Apply  to affected area as needed.)  15 g  1     ?  levothyroxine (SYNTHROID) 100 mcg tablet  Take 1 Tablet by mouth Daily (before breakfast). (Patient taking differently: Take 125 mcg by mouth Daily (before breakfast).)  30 Tablet  11           ?  gabapentin (NEURONTIN) 100 mg capsule  Take 100 mg by mouth three (3) times daily as needed.               ?  albuterol (PROVENTIL HFA, VENTOLIN HFA, PROAIR HFA) 90 mcg/actuation inhaler  Take 1 Puff by inhalation every six (6) hours as needed for Wheezing.  1  Inhaler  5     ?  losartan (COZAAR) 100 mg tablet  TAKE 1 TABLET BY MOUTH DAILY  90 Tab  3     ?  fluticasone propionate (FLONASE) 50 mcg/actuation nasal spray  2 Sprays by Both Nostrils route daily. (Patient taking differently: 2 Sprays by Both Nostrils route daily as needed.)  1 Bottle  5     ?  denosumab (PROLIA) 60 mg/mL injection  60 mg by SubCUTAneous route every 6 months. Due December 2021         ?  methotrexate, PF, 25 mg/mL injection  25 mg every seven (7) days. Friday evening         ?  nicotinic acid (NIACIN) 500 mg tablet  Take 500 mg by mouth Daily (before breakfast). 2 po everyday.          ?  multivitamin (ONE A DAY) tablet  Take 1 Tab by mouth daily.               ?  metoprolol succinate (TOPROL-XL) 50 mg XL tablet  Take 50 mg by mouth daily. (Patient not taking: Reported on 07/10/2020)               OBJECTIVE:   Visit Vitals      BP  (!) 148/89 (BP 1 Location: Left arm, BP Patient Position: Sitting, BP Cuff Size: Adult)     Pulse  99     Temp  97.4 ??F (36.3 ??C) (Oral)     Resp  20     Ht  5\' 10"  (1.778 m)     Wt  152 lb 6 oz (69.1 kg)     SpO2  94%        BMI  21.86 kg/m??           Physical Exam:      Constitutional:  Well developed, well nourished female in no acute  distress, sitting comfortably on the examination table.         HEENT:  Normocephalic and atraumatic. Sclerae anicteric. Neck supple without JVD. No thyromegaly present.      Lymph node     Deferred.       Skin  Warm and dry.  No bruising and no rash noted.  No erythema.  No pallor.      Respiratory  Lungs are clear to auscultation bilaterally without wheezes, rales or rhonchi, normal air exchange without accessory muscle use.         CVS  Normal rate, regular rhythm and normal S1 and S2.  No murmurs, gallops, or rubs.        Abdomen  Soft, nontender and nondistended, normoactive bowel sounds.  No palpable mass.  No hepatosplenomegaly.     Neuro  Grossly nonfocal with no obvious sensory or motor deficits.     MSK  Normal range of motion  in general.  No edema and no tenderness.        Psych  Appropriate mood and affect.         Labs:   No results found for this or any previous visit (from the past 24 hour(s)).      Imaging:   EXAM: PET/CT TUMOR IMAGE SKULL THIGH W (INI)   ??   INDICATION: highly suspicious for lung cancer.   ??   PROCEDURE: Following IV injection of 13.91 mCi 18 Fluoro 2 deoxyglucose (FDG)   and a standard uptake delay, PET imaging is performed from head to thighs and   axial, sagittal and coronal images were acquired. Unenhanced CT is obtained for   anatomic localization, and attenuation correction of the PET scan. Patient   preprocedure blood glucose level: 1:15mg /dL.   ??   CORRELATIVE IMAGING STUDIES: CT chest dated 02/29/2020.   ??   COMPARISON PET: No prior PET/CT   ??   HISTORY: The study is requested for initial treatment strategy. .   ??   FINDINGS:   ??   HEAD/NECK:    Mildly hypermetabolic nodule within the superficial left parotid gland, favored   to represent a lymph node. No other hypermetabolic cervical lymphadenopathy.   Cerebral evaluation is limited by normal intense activity.   ??   CHEST:    Redemonstrated 6 cm pulmonary mass within the right lower lobe which   demonstrates peripheral nodular hypermetabolic activity (max SUV 8.9).   Additional nodular area inferiorly along the right posterior pleura which   demonstrates increased metabolic activity (max SUV 8.9). No hypermetabolic hilar   or mediastinal lymphadenopathy.  Scattered calcified atherosclerotic disease   ??   ABDOMEN/PELVIS:    No foci of abnormal hypermetabolism. Suspected physiologic uptake within the   gastrointestinal and genitourinary tracts.   ??   SKELETON:    No foci of abnormal hypermetabolism in the axial and visualized appendicular   skeleton.   ??   IMPRESSION   1.  The 6 cm right lower lobe pulmonary mass demonstrates peripheral nodular   hypermetabolic activity, concerning for primary lung neoplasm with central   necrosis.   2.  No hypermetabolic  lymphadenopathy or distant metastatic disease.   3.  Mildly enlarged and metabolic left parotid lymph node, likely reactive in   Etiology.         Pathology:   * * *DIAGNOSIS* * * * * * * * * * * * * * * * * * * * * * * * * * * * * * * ??     ???? ?? A: ??"CHEST WALL MASS CONTENTS":   ???? ?? NECROTIC DEBRIS  WITH OUT IDENTIFIABLE VIABLE MALIGNANT TUMOR   ???? ??   B: ??"NUMBER 8 LYMPH NODE X2":   ???? ?? ANTHRACOTIC LYMPH NODE NEGATIVE FOR METASTATIC TUMOR   ???? ??   C: ??"NUMBER 7 LYMPH NODE X4":   ????  ?? FOUR FRAGMENTS OF ANTHRACOTIC LYMPH NODE NEGATIVE FOR METASTATIC   TUMOR   ???? ??   D: ??"INTERLOBAR LYMPH NODE":   ???? ?? FOUR FRAGMENTS OF ANTHRACOTIC LYMPH NODE NEGATIVE FOR METASTATIC   TUMOR   ????  ??   E: ??"RIGHT LOWER LOBE OF LUNG":   ???? ?? POORLY DIFFERENTIATED MALIGNANT TUMOR CONSISTENT WITH POORLY   DIFFERENTIATED ADENOCARCINOMA MEASURING AP PROXIMALLY ??6.4 X 5.2 X 4.6 CM   WITH EXTENSIVE NECROSIS. BRONCHIAL  AND VASCULAR MARGINS OF RESECTION ARE   NEGATIVE FOR MALIGNANT TUMOR. FOUR ANTHRACOTIC PERIBRONCHIAL LYMPH NODES   NEGATIVE FOR METASTATIC TUMOR. SPECIMEN MARKED "CHEST WALL MASS" INVOLVED   BY POORLY DIFFERENTIATED MALIGNANT TUMOR SIMILAR  TO THAT WITHIN LUNG. SEE   COMMENT          * * *DIAGNOSIS* * * * * * * * * * * * * * * * * * * * * * * * * * * * * * * ??     ???? ?? "RIGHT LUNG MASS": ??IMMUNOHISTOCHEMICAL FINDINGS MOST CONSISTENT  WITH   ADENOCARCINOMA OF LUNG ORIGIN.           tls/04/17/2020     * * *Electronically Signed Out* * * ?? ?? ??   Sign Out Date: 04/18/2020 ??Charles A. Renella Cunas, MD           * * *PROCEDURES/ADDENDA*  * * * * * * * * * * * * * * * * * * * * ??* * * * ??     ???? ?? ?? ?? ?? ?? ?? ?? ??STF-IMMUNOHISTOCHEMISTRY     ???? ?? ?? ?? ?? ?? ?? ?? ?? ?? ?? ?? ?? ?? ?? ?? ?? ?? ??  ?? ?? ?? ?? ?? ?? ?? ?? ?? ?? ?? ?? ?? ?? ?? ?? ?? ??   ??Status: ??Reviewed By:   ??Charles A. Lorel Monaco, III, MD on 04/18/2020   ???? ??   ???? ?? ?? ?? ?? ?? ?? ?? ??  ?? ?? Interpretation   ???? ?? ?? ?? ?? ?? ?? ?? ?? ??Immunohistochemical Stain Panel:     ???? ??  Interpretation: ??Immunohistochemical  findings most consistent with   adenocarcinoma of lung origin.      Antibody/Test ?? ?? ?? ?? ?? ?? ?? Marker For ?? ?? ?? ?? ?? ?? ?? ?? ?? ?? ?? ?? ?? ?? ??Result   CK 5/6 ?? ?? ?? ?? ?? ?? ?? ?? ?? ??Squamous cell carcinomas and mesotheliomas ??  ?? ??   ???? ??Negative   p40 ?? ?? ?? ?? ?? ?? ?? ?? ?? ??Squamous differentiation ?? ?? ?? ?? ?? ?? ?? ?? ?? ??   Negative   TTF-1 ?? ?? ?? ?? ?? ?? ?? ?? ?? ?? Lung  and thyroid adenocarcinoma ?? ?? ?? ?? ?? ?? ??   Positive   Napsin ?? ?? ?? ?? ?? ?? ?? ?? ?? ?? Lung adenocarcinoma ?? ?? ?? ?? ?? ?? ?? ?? ?? ?? ?? ??   Negative ??                ASSESSMENT:             ICD-10-CM  ICD-9-CM             1.  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[facility])  ICD-10-CM: C34.91   ICD-9-CM: 162.9    05/07/2020                       Bronchogenic cancer of right lung ([place])  ICD-10-CM: C34.91   ICD-9-CM: 162.9    04/28/2020                       Right lower lobe lung mass  ICD-10-CM: R91.8   ICD-9-CM: 786.6    03/20/2020                       Dry skin dermatitis  ICD-10-CM: L85.3   ICD-9-CM: 692.89    01/03/2020                       Bilateral leg cramps  ICD-10-CM: R25.2   ICD-9-CM: 729.82    01/03/2020                       Elevated blood sugar  ICD-10-CM: R73.9   ICD-9-CM: 790.29    01/03/2020                       Hyperlipidemia  ICD-10-CM: E78.5   ICD-9-CM: 272.4    01/04/2019  Chronic obstructive pulmonary disease ([place])  ICD-10-CM: J44.9   ICD-9-CM: 010    01/04/2019                       Pre-ulcerative corn or callous  ICD-10-CM: L84   ICD-9-CM: 700    06/25/2018                       Chronic pain of left knee  ICD-10-CM: M25.562, G89.29   ICD-9-CM: 719.46, 338.29    01/26/2018           Overview Signed 02/29/2020 12:10 PM by Selinda Orion, NP            Last Assessment & Plan:    Formatting of this note might be different from the original.   Discussed in detail today.  We will see if we can get Visco therapy approved.  Referral to PT.                                   Prediabetes  ICD-10-CM: R73.03   ICD-9-CM: 790.29    06/10/2016                       Chronic right-sided low back pain without sciatica  ICD-10-CM: M54.50, G89.29   ICD-9-CM: 724.2, 338.29    02/12/2016                       Environmental allergies  ICD-10-CM: Z91.09   ICD-9-CM: V15.09    10/05/2014                       Rheumatoid arthritis involving multiple sites with positive rheumatoid factor (HCC)  ICD-10-CM: M05.79   ICD-9-CM: 714.0    01/30/2014          Overview Signed 02/29/2020 12:10 PM by Selinda Orion, NP            Last Assessment & Plan:    Formatting of this note might be different from the original.   Chronic/Stable. Denies any recent RA flares. Patient is taking MTX and folic acid. Denies any adverse side effects. Continue with current treatment plan. Reviewed previous labs. Ordered labs today. Will continue to monitor over time.      Instructed patient to contact our office with any new or worsening symptoms.                                   Hypothyroidism  ICD-10-CM: E03.9   ICD-9-CM: 244.9    01/30/2014                       Vitamin D deficiency  ICD-10-CM: E55.9   ICD-9-CM: 268.9    01/30/2014          Overview Signed 02/29/2020 12:10 PM by Selinda Orion, NP            Last Assessment & Plan:    Formatting of this note might be different from the original.   Encouraged appropriate vitamin D supplementation. Monitor for signs or symptoms of hypercalcemia.  Osteoporosis  ICD-10-CM: M81.0   ICD-9-CM: 733.00    01/30/2014                       Gastroesophageal reflux disease without esophagitis  ICD-10-CM: K21.9   ICD-9-CM: 530.81    01/30/2014                       HTN (hypertension)   ICD-10-CM: I10   ICD-9-CM: 401.9    01/30/2014                             PLAN:   Lab studies and imaging studies were personally reviewed.      Pertinent old records were reviewed.      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  PET/CT shows no distant disease.  Clinically T3N0M0.  She completed VATS with left lower lobectomy as well as chest wall mass resection  and lymphadenectomy on 05/07/20.  She tolerated this reasonably well, still with some post-op tenderness but nothing unmanageable.  She is not currently on any pain medication.  No dyspnea or cough.  Her tumor was 6.4 cm in greatest dimension with carcinoma  in the specimen marked "chest wall" but with separation from tumor by connective tissue and lung parenchyma.  13 lymph node fragments were all negative for tumor.  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   Because of the T3N0 disease, we would consider adjuvant chemotherapy, likely cisplatin and pemetrexed for 4 cycles.  We do also have the ALCHEMIST trial available, but unfortunately she is not a candidate due to her history of RA with MTX use.  She started  Cis/Alimta on 07/11/2020.          She is here today for follow up and consideration of C1 of Cis/Alimta.  She has been okay overall since last seen.  She has had some shortness of breath/cough, feels she had a recent upper respiratory infection and resolving.  She has not been needing  any cough medicine, taking Mucinex and this is helping.  She had a x1 fever (low grade 100) Sunday night - no fevers since then.  She is on oxygen today, O2 sat 94% on oxygen today.  She still is not requiring any pain medicine, occasional pain at post-surgical  site.  She denies any issues with appetite, no current GI or bowel complaints.  She had her port placed previously and also chemo education, no further questions regarding nausea/side  effect management.  Labs reviewed and adequate for treatment tomorrow.   Discussed the above with Dr. Jenetta Loges and he agreed with proceeding with treatment as planned for tomorrow.  All questions were asked and answered to the best of my ability.  In all, I spent 30 minutes in the care of  Audrey Snow today, over 50% of which was in direct counseling and coordination of care.                Kerby Nora, NP   [facility] [place] [place] [address]   Office : [phone]   Fax : [phone]

## 2020-07-11 ENCOUNTER — Inpatient Hospital Stay: Admit: 2020-07-11 | Payer: MEDICARE | Primary: Internal Medicine

## 2020-07-11 DIAGNOSIS — Z5111 Encounter for antineoplastic chemotherapy: Secondary | ICD-10-CM

## 2020-07-11 MED ORDER — MEPERIDINE (PF) 25 MG/ML INJ SOLUTION
25 mg/ml | INTRAMUSCULAR | Status: AC | PRN
Start: 2020-07-11 — End: 2020-07-11

## 2020-07-11 MED ORDER — ONDANSETRON (PF) 4 MG/2 ML INJECTION
4 mg/2 mL | Freq: Once | INTRAMUSCULAR | Status: AC
Start: 2020-07-11 — End: 2020-07-11
  Administered 2020-07-11: 16:00:00 via INTRAVENOUS

## 2020-07-11 MED ORDER — SODIUM CHLORIDE 0.9 % IJ SYRG
INTRAMUSCULAR | Status: AC | PRN
Start: 2020-07-11 — End: 2020-07-11
  Administered 2020-07-11: 21:00:00 via INTRAVENOUS

## 2020-07-11 MED ORDER — DIPHENHYDRAMINE HCL 50 MG/ML IJ SOLN
50 mg/mL | INTRAMUSCULAR | Status: AC | PRN
Start: 2020-07-11 — End: 2020-07-11

## 2020-07-11 MED ORDER — HYDROCORTISONE SOD SUCCINATE (PF) 100 MG/2 ML SOLUTION FOR INJECTION
100 mg/2 mL | INTRAMUSCULAR | Status: AC | PRN
Start: 2020-07-11 — End: 2020-07-11

## 2020-07-11 MED ORDER — SODIUM CHLORIDE 0.9 % INJECTION
20 mg/2 mL | INTRAMUSCULAR | Status: AC | PRN
Start: 2020-07-11 — End: 2020-07-11

## 2020-07-11 MED ORDER — SODIUM CHLORIDE 0.9 % IV
INTRAVENOUS | Status: AC
Start: 2020-07-11 — End: 2020-07-11
  Administered 2020-07-11: 16:00:00 via INTRAVENOUS

## 2020-07-11 MED ORDER — MAGNESIUM SULFATE 50 % (4 MEQ/ML) INJECTION
450 mEq/mL (50 %) | Freq: Once | INTRAMUSCULAR | Status: AC
Start: 2020-07-11 — End: 2020-07-11
  Administered 2020-07-11: 20:00:00 via INTRAVENOUS

## 2020-07-11 MED ORDER — DEXAMETHASONE SODIUM PHOSPHATE 4 MG/ML IJ SOLN
4 mg/mL | Freq: Once | INTRAMUSCULAR | Status: AC
Start: 2020-07-11 — End: 2020-07-11
  Administered 2020-07-11: 16:00:00 via INTRAVENOUS

## 2020-07-11 MED ORDER — POTASSIUM CHLORIDE 2 MEQ/ML IV SOLN
2 mEq/mL | Freq: Once | INTRAVENOUS | Status: AC
Start: 2020-07-11 — End: 2020-07-11
  Administered 2020-07-11: 15:00:00 via INTRAVENOUS

## 2020-07-11 MED ORDER — FOSAPREPITANT 150 MG IV SOLUTION
150 mg | Freq: Once | INTRAVENOUS | Status: AC
Start: 2020-07-11 — End: 2020-07-11
  Administered 2020-07-11: 17:00:00 via INTRAVENOUS

## 2020-07-11 MED ORDER — CISPLATIN 1 MG/ML IV SOLN
1 mg/mL | Freq: Once | INTRAVENOUS | Status: AC
Start: 2020-07-11 — End: 2020-07-11
  Administered 2020-07-11: 18:00:00 via INTRAVENOUS

## 2020-07-11 MED ORDER — SODIUM CHLORIDE 0.9 % IV
500 mg | Freq: Once | INTRAVENOUS | Status: AC
Start: 2020-07-11 — End: 2020-07-11
  Administered 2020-07-11: 18:00:00 via INTRAVENOUS

## 2020-07-11 MED FILL — SOLU-CORTEF ACT-O-VIAL (PF) 100 MG/2 ML SOLUTION FOR INJECTION: 100 mg/2 mL | INTRAMUSCULAR | Qty: 2

## 2020-07-11 MED FILL — SODIUM CHLORIDE 0.9 % IV: INTRAVENOUS | Qty: 1000

## 2020-07-11 MED FILL — DEXAMETHASONE SODIUM PHOSPHATE 4 MG/ML IJ SOLN: 4 mg/mL | INTRAMUSCULAR | Qty: 3

## 2020-07-11 MED FILL — DIPHENHYDRAMINE HCL 50 MG/ML IJ SOLN: 50 mg/mL | INTRAMUSCULAR | Qty: 1

## 2020-07-11 MED FILL — ONDANSETRON (PF) 4 MG/2 ML INJECTION: 4 mg/2 mL | INTRAMUSCULAR | Qty: 4

## 2020-07-11 MED FILL — FOSAPREPITANT 150 MG IV SOLUTION: 150 mg | INTRAVENOUS | Qty: 5

## 2020-07-11 MED FILL — ALIMTA 500 MG INTRAVENOUS SOLUTION: 500 mg | INTRAVENOUS | Qty: 20

## 2020-07-11 MED FILL — CISPLATIN 1 MG/ML IV SOLN: 1 mg/mL | INTRAVENOUS | Qty: 139.5

## 2020-07-11 NOTE — Progress Notes (Signed)
Arrived to the Infusion Center.  D1C1 Alimta, Cisplatin with pre and post hydration completed. Patient tolerated without problems.   Any issues or concerns during appointment: no. Patien twas given written instructions regarding use of anti emetics at home  Patient aware of next infusion appointment on 08/01/20 (date) at 0900 (time).  Patient instructed to call provider with temperature of 100.4 or greater or nausea/vomiting/ diarrhea or pain not controlled by medications  Discharged ambulatory with spouse.

## 2020-07-11 NOTE — Progress Notes (Signed)
Problem: Knowledge Deficit  Goal: *Participate in the learning process  Outcome: Progressing Towards Goal  Goal: *Verbalize description of procedure  Outcome: Progressing Towards Goal  Goal: *Verbalizes understanding and describes medication purposes and frequencies  Outcome: Progressing Towards Goal  Goal: *Knowledge of discharge instructions  Outcome: Progressing Towards Goal  Goal: Interventions  Outcome: Progressing Towards Goal     Problem: Patient Education: Go to Patient Education Activity  Goal: Patient/Family Education  Outcome: Progressing Towards Goal

## 2020-07-29 MED ORDER — LOSARTAN 100 MG TAB
100 mg | ORAL_TABLET | ORAL | 3 refills | Status: AC
Start: 2020-07-29 — End: ?

## 2020-07-30 ENCOUNTER — Encounter

## 2020-07-31 ENCOUNTER — Ambulatory Visit: Attending: Internal Medicine | Primary: Internal Medicine

## 2020-07-31 ENCOUNTER — Ambulatory Visit: Admit: 2020-07-31 | Discharge: 2020-07-31 | Payer: MEDICARE | Attending: Internal Medicine | Primary: Internal Medicine

## 2020-07-31 ENCOUNTER — Inpatient Hospital Stay: Admit: 2020-07-31 | Payer: MEDICARE | Primary: Internal Medicine

## 2020-07-31 DIAGNOSIS — C3491 Malignant neoplasm of unspecified part of right bronchus or lung: Secondary | ICD-10-CM

## 2020-07-31 LAB — CBC WITH AUTO DIFFERENTIAL
Basophils %: 1 % (ref 0.0–2.0)
Basophils Absolute: 0.1 10*3/uL (ref 0.0–0.2)
Eosinophils %: 4 % (ref 0.5–7.8)
Eosinophils Absolute: 0.2 10*3/uL (ref 0.0–0.8)
Granulocyte Absolute Count: 0 10*3/uL (ref 0.0–0.5)
Hematocrit: 38.2 % (ref 35.8–46.3)
Hemoglobin: 12.2 g/dL (ref 11.7–15.4)
Immature Granulocytes: 1 % (ref 0.0–5.0)
Lymphocytes %: 54 % — ABNORMAL HIGH (ref 13–44)
Lymphocytes Absolute: 2.3 10*3/uL (ref 0.5–4.6)
MCH: 30.8 PG (ref 26.1–32.9)
MCHC: 31.9 g/dL (ref 31.4–35.0)
MCV: 96.5 FL (ref 79.6–97.8)
MPV: 9.4 FL (ref 9.4–12.3)
Monocytes %: 18 % — ABNORMAL HIGH (ref 4.0–12.0)
Monocytes Absolute: 0.8 10*3/uL (ref 0.1–1.3)
NRBC Absolute: 0 10*3/uL (ref 0.0–0.2)
Neutrophils %: 23 % — ABNORMAL LOW (ref 43–78)
Neutrophils Absolute: 1 10*3/uL — ABNORMAL LOW (ref 1.7–8.2)
Platelets: 448 10*3/uL (ref 150–450)
RBC: 3.96 M/uL — ABNORMAL LOW (ref 4.05–5.2)
RDW: 14.9 % — ABNORMAL HIGH (ref 11.9–14.6)
WBC: 4.3 10*3/uL (ref 4.3–11.1)

## 2020-07-31 LAB — COMPREHENSIVE METABOLIC PANEL
ALT: 22 U/L (ref 12–65)
AST: 19 U/L (ref 15–37)
Albumin/Globulin Ratio: 0.9 — ABNORMAL LOW (ref 1.2–3.5)
Albumin: 3.5 g/dL (ref 3.2–4.6)
Alkaline Phosphatase: 47 U/L — ABNORMAL LOW (ref 50–136)
Anion Gap: 6 mmol/L — ABNORMAL LOW (ref 7–16)
BUN: 17 MG/DL (ref 8–23)
CO2: 29 mmol/L (ref 21–32)
Calcium: 9.4 MG/DL (ref 8.3–10.4)
Chloride: 103 mmol/L (ref 98–107)
Creatinine: 1.2 MG/DL — ABNORMAL HIGH (ref 0.6–1.0)
EGFR IF NonAfrican American: 46 mL/min/{1.73_m2} — ABNORMAL LOW (ref >60)
GFR African American: 56 mL/min/{1.73_m2} — ABNORMAL LOW (ref >60)
Globulin: 4 g/dL — ABNORMAL HIGH (ref 2.3–3.5)
Glucose: 106 mg/dL — ABNORMAL HIGH (ref 65–100)
Potassium: 3.9 mmol/L (ref 3.5–5.1)
Sodium: 138 mmol/L (ref 136–145)
Total Bilirubin: 0.4 MG/DL (ref 0.2–1.1)
Total Protein: 7.5 g/dL (ref 6.3–8.2)

## 2020-07-31 LAB — MAGNESIUM
Magnesium: 2.2 mg/dL (ref 1.8–2.4)
Magnesium: 2.2 mg/dL (ref 1.8–2.4)

## 2020-07-31 LAB — METABOLIC PANEL, COMPREHENSIVE
A-G Ratio: 0.9 — ABNORMAL LOW (ref 1.2–3.5)
ALT (SGPT): 22 U/L (ref 12–65)
AST (SGOT): 19 U/L (ref 15–37)
Albumin: 3.5 g/dL (ref 3.2–4.6)
Alk. phosphatase: 47 U/L — ABNORMAL LOW (ref 50–136)
Anion gap: 6 mmol/L — ABNORMAL LOW (ref 7–16)
BUN: 17 MG/DL (ref 8–23)
Bilirubin, total: 0.4 MG/DL (ref 0.2–1.1)
CO2: 29 mmol/L (ref 21–32)
Calcium: 9.4 MG/DL (ref 8.3–10.4)
Chloride: 103 mmol/L (ref 98–107)
Creatinine: 1.2 MG/DL — ABNORMAL HIGH (ref 0.6–1.0)
GFR est AA: 56 mL/min/{1.73_m2} — ABNORMAL LOW (ref >60)
GFR est non-AA: 46 mL/min/{1.73_m2} — ABNORMAL LOW (ref >60)
Globulin: 4 g/dL — ABNORMAL HIGH (ref 2.3–3.5)
Glucose: 106 mg/dL — ABNORMAL HIGH (ref 65–100)
Potassium: 3.9 mmol/L (ref 3.5–5.1)
Protein, total: 7.5 g/dL (ref 6.3–8.2)
Sodium: 138 mmol/L (ref 136–145)

## 2020-07-31 LAB — CBC WITH AUTOMATED DIFF
ABS. BASOPHILS: 0.1 10*3/uL (ref 0.0–0.2)
ABS. EOSINOPHILS: 0.2 10*3/uL (ref 0.0–0.8)
ABS. IMM. GRANS.: 0 10*3/uL (ref 0.0–0.5)
ABS. LYMPHOCYTES: 2.3 10*3/uL (ref 0.5–4.6)
ABS. MONOCYTES: 0.8 10*3/uL (ref 0.1–1.3)
ABS. NEUTROPHILS: 1 10*3/uL — ABNORMAL LOW (ref 1.7–8.2)
ABSOLUTE NRBC: 0 10*3/uL (ref 0.0–0.2)
BASOPHILS: 1 % (ref 0.0–2.0)
EOSINOPHILS: 4 % (ref 0.5–7.8)
HCT: 38.2 % (ref 35.8–46.3)
HGB: 12.2 g/dL (ref 11.7–15.4)
IMMATURE GRANULOCYTES: 1 % (ref 0.0–5.0)
LYMPHOCYTES: 54 % — ABNORMAL HIGH (ref 13–44)
MCH: 30.8 PG (ref 26.1–32.9)
MCHC: 31.9 g/dL (ref 31.4–35.0)
MCV: 96.5 FL (ref 79.6–97.8)
MONOCYTES: 18 % — ABNORMAL HIGH (ref 4.0–12.0)
MPV: 9.4 FL (ref 9.4–12.3)
NEUTROPHILS: 23 % — ABNORMAL LOW (ref 43–78)
PLATELET: 448 10*3/uL (ref 150–450)
RBC: 3.96 M/uL — ABNORMAL LOW (ref 4.05–5.2)
RDW: 14.9 % — ABNORMAL HIGH (ref 11.9–14.6)
WBC: 4.3 10*3/uL (ref 4.3–11.1)

## 2020-07-31 NOTE — Progress Notes (Signed)
Progress Notes by Greer Ee, MD at 07/31/20 1030                Author: Greer Ee, MD  Service: --  Author Type: Physician       Filed: 07/31/20 1114  Encounter Date: 07/31/2020  Status: Signed          Editor: Greer Ee, MD (Physician)               [facility] Hematology and Oncology: Office Visit Established Patient      Chief Complaint:       Chief Complaint       Patient presents with        ?  Follow-up              History of Present Illness:   Audrey Snow is a  80 y.o. female who presents today for follow-up regarding resected adenocarcinoma of the  lung.  On 02/27/20, Ms. Verne presented to [facility] ED with complaints of achiness in chest with radiation of pain to her left upper extremity with association of N/V, SOB and diaphoresis. Cardiac work-up was negative; however her chest x-ray reported a 5.1  cm rounded opacity at the right lung base.  She saw her PCP on 02/29/20, CT scan was ordered and reported a 5 cm mass with adjacent pleural nodularity posteriorly at the right lung base and considered suspicious for bronchogenic carcinoma. On 04/02/20 she  had a PET/CT scan that reported a 6 cm right lower lobe pulmonary mass with no metastatic disease.  Biopsy showed adenocarcinoma of lung origin.  We saw her prior to surgical evaluation and recommended proceeding with lobectomy and lymphadenectomy if  she was a surgical candidate.  She completed VATS with left lower lobectomy as well as chest wall mass resection and lymphadenectomy on 05/07/20.  Her tumor was 6.4 cm in greatest dimension with carcinoma in the specimen marked "chest wall" but with separation  from tumor by connective tissue and lung parenchyma.  13 lymph node fragments were all negative for tumor.  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  Because of the T3N0 disease, we would consider adjuvant  chemotherapy, likely cisplatin and pemetrexed for 4 cycles.  We do also have the  ALCHEMIST trial available, but unfortunately she is not a candidate due to her history of RA with MTX use.       She is here today for follow up and consideration of cycle 2 of Cis/Alimta.  She tolerated cycle 1 reasonably well, she feels like she is doing excellent.  Her nausea was significant the first few days, her husband notes that once she started taking the  antiemetic pills she was much better.  No mucositis or bowel changes.  Energy levels are back to normal, ECOG 0.  She denies any pain.  She has some right abdominal wall fullness related to recent surgery, some improvement over time.  No fevers or infectious  symptoms.         Review of Systems:   Constitutional: Negative.    HENT: Negative.    Eyes: Negative.    Respiratory: Negative.    Cardiovascular: Negative.    Gastrointestinal: Negative.    Genitourinary: Negative.    Musculoskeletal: Negative.    Skin: Negative.    Neurological: Negative.    Endo/Heme/Allergies: Negative.    Psychiatric/Behavioral: Negative.    All other systems reviewed  and are negative.            Allergies        Allergen  Reactions         ?  Yeast  Nausea and Vomiting             Pt. Says she hasnt had issues with this in a long time          Past Medical History:        Diagnosis  Date         ?  Arrhythmia  04/05/2020          PVCs.  04/05/20 was referred to [facility] for tachycardia, was seen 05/01/20 [facility]         ?  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  Autoimmune disease ([place])            RA         ?  Bronchogenic cancer of right lung ([place])  04/28/2020          adenocarcinoma         ?  Chronic obstructive pulmonary disease (HCC)            uses inhalers         ?  Chronic pain of left knee  06/25/2018     ?  Coronary atherosclerosis            noted on chest CT         ?  COVID-19 vaccine series completed  08/15/2019          Pfizer; 07/29/2019 and booster given 03/12/2020          ?  GERD (gastroesophageal reflux disease)            no meds at this time         ?  H/O cardiovascular stress test            Bruce protocol, low risk study, EF of 67% and normal LV function         ?  H/O mammogram  07/28/2016          mammogram and Ultrasound of right breaset- benign finding- [facility]          ?  Hearing loss            "only hears out of left ear"         ?  Hearing loss of both ears            no hearing aids          ?  HTN (hypertension), benign  01/30/2014          controlled w/med         ?  Hypothyroid            on med for control         ?  Menopause       ?  Osteoarthritis       ?  Osteoporosis            prolia          ?  Poor historian            patient had trouble remembering names of meds and what they were for and had to  repeat instructions for surgery several times          ?  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 Debbra Riding. Methotrexate Rx         ?  Sinusitis            sinus flonase          ?  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 Nancie Neas         ?  Unspecified hypothyroidism  01/30/2014          thyroid medication         ?  Vitamin D deficiency            medications for this           Past Surgical History:         Procedure  Laterality  Date          ?  COLONOSCOPY  N/A  01/27/2018          COLONOSCOPY/BMI 26 performed by Azalia Bilis, MD at [place]          ?  HX CARPAL TUNNEL RELEASE  Bilateral       ?  HX COLONOSCOPY              colon polyps-adenomatous, has seen Dr. Kristopher Glee in past          ?  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  HX HEMORRHOIDECTOMY         ?  HX OTHER SURGICAL    09/2012          sinus surgery-Dr. Mickle Mallory          ?  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  HX TONSILLECTOMY    80 yrs old     ?  PR COLONOSCOPY FLX DX W/COLLJ SPEC WHEN PFRMD    01/27/2018                     Family History         Problem  Relation  Age of Onset           ?  OSTEOARTHRITIS  Mother       ?  Alzheimer's Disease  Father            ?  Breast Cancer  Neg Hx            Social History          Socioeconomic History         ?  Marital status:  MARRIED              Spouse name:  Not on file         ?  Number of children:  Not on file     ?  Years of education:  Not on file     ?  Highest education level:  Not on file       Occupational History        ?  Not on file       Tobacco  Use         ?  Smoking status:  Former Smoker              Packs/day:  0.50         Years:  43.00         Pack years:  21.50         Types:  Cigarettes         Start date:  1961         Quit date:  1985         Years since quitting:  37.0         ?  Smokeless tobacco:  Never Used        ?  Tobacco comment: not regular when first started- 1pk/week       Vaping Use         ?  Vaping Use:  Never used       Substance and Sexual Activity         ?  Alcohol use:  No              Alcohol/week:  0.0 standard drinks         ?  Drug use:  No     ?  Sexual activity:  Not on file        Other Topics  Concern        ?  Not on file       Social History Narrative          Married and lives with husband.  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          Social Determinants of Health          Financial Resource Strain:         ?  Difficulty of Paying Living Expenses: Not on file       Food Insecurity:         ?  Worried About Running Out of Food in the Last Year: Not on file     ?  Ran Out of Food in the Last Year: Not on file       Transportation Needs:         ?  Lack of Transportation (Medical): Not on file     ?  Lack of Transportation (Non-Medical): Not on file       Physical Activity:         ?  Days of Exercise per Week: Not on file     ?  Minutes of Exercise per Session: Not on file       Stress:         ?  Feeling of Stress : Not on file       Social Connections:         ?  Frequency of Communication with Friends and Family: Not on file     ?  Frequency of Social Gatherings with Friends and Family: Not on  file     ?  Attends Religious Services: Not on file     ?  Active Member of Clubs or Organizations: Not on file     ?  Attends Archivist Meetings: Not on file     ?  Marital Status: Not on file       Intimate Partner Violence:         ?  Fear of Current or Ex-Partner: Not on file     ?  Emotionally Abused: Not on file     ?  Physically Abused: Not on file     ?  Sexually Abused: Not on file       Housing Stability:         ?  Unable to Pay for Housing in the Last Year: Not on file     ?  Number of Places Lived in the Last Year: Not on file        ?  Unstable Housing in the Last Year: Not on file          Current Outpatient Medications          Medication  Sig  Dispense  Refill           ?  losartan (COZAAR) 100 mg tablet  TAKE 1 TABLET BY MOUTH DAILY  90 Tablet  3     ?  ondansetron hcl (ZOFRAN) 8 mg tablet  Take 1 Tablet by mouth every eight (8) hours as needed for Nausea or Vomiting.  90 Tablet  1     ?  prochlorperazine (Compazine) 10 mg tablet  Take 1 Tablet by mouth every six (6) hours as needed for Nausea or Vomiting.  90 Tablet  1     ?  magnesium 250 mg tab  Take  by mouth daily.         ?  folic acid (FOLVITE) 1 mg tablet  Take 1 Tablet by mouth daily.  90 Tablet  3     ?  lidocaine-prilocaine (EMLA) topical cream  Apply  to affected area as needed for Pain.  30 g  0     ?  umeclidinium-vilanteroL (Anoro Ellipta) 62.5-25 mcg/actuation inhaler  Take 1 Puff by inhalation daily. (Patient taking differently: Take 1 Puff by inhalation as needed.)  1 Each  11     ?  metoprolol succinate (TOPROL-XL) 50 mg XL tablet  Take 50 mg by mouth daily.         ?  atorvastatin (LIPITOR) 20 mg tablet  Take 20 mg by mouth daily.         ?  ergocalciferol (ERGOCALCIFEROL) 1,250 mcg (50,000 unit) capsule  Take 50,000 Units by mouth every fourteen (14) days.               ?  levothyroxine (SYNTHROID) 100 mcg tablet  Take 1 Tablet by mouth Daily (before breakfast). (Patient taking differently: Take 125 mcg by mouth  Daily (before breakfast).)  30 Tablet  11           ?  gabapentin (NEURONTIN) 100 mg capsule  Take 100 mg by mouth three (3) times daily as needed.         ?  albuterol (PROVENTIL HFA, VENTOLIN HFA, PROAIR HFA) 90 mcg/actuation inhaler  Take 1 Puff by inhalation every six (6) hours as needed for Wheezing.  1 Inhaler  5     ?  fluticasone propionate (FLONASE) 50 mcg/actuation nasal spray  2 Sprays by Both Nostrils route daily. (Patient taking differently: 2 Sprays by Both Nostrils route daily as needed.)  1 Bottle  5     ?  denosumab (PROLIA) 60 mg/mL injection  60 mg by SubCUTAneous route every 6 months. Due December 2021         ?  methotrexate, PF, 25 mg/mL injection  25 mg every seven (7) days. Friday evening         ?  nicotinic acid (NIACIN) 500  mg tablet  Take 500 mg by mouth Daily (before breakfast). 2 po everyday.          ?  multivitamin (ONE A DAY) tablet  Take 1 Tab by mouth daily.         ?  OTHER,NON-FORMULARY,  daily. Prevagen               ?  mupirocin (BACTROBAN) 2 % ointment  Apply  to affected area daily. (Patient not taking: Reported on 07/31/2020)  15 g  1           OBJECTIVE:   Visit Vitals       BP  (!) 98/58 (BP 1 Location: Left arm)  Comment: Standing        Pulse  83     Temp  98 ??F (36.7 ??C) (Oral)     Ht  '5\' 10"'  (1.778 m)     Wt  152 lb 4.8 oz (69.1 kg)     SpO2  96%        BMI  21.85 kg/m??           Physical Exam:      Constitutional:  Well developed, well nourished female in no acute  distress, sitting comfortably on the examination table.         HEENT:  Normocephalic and atraumatic. Sclerae anicteric. Neck supple without JVD. No thyromegaly present.      Lymph node     Deferred.       Skin  Warm and dry.  No bruising and no rash noted.  No erythema.  No pallor.      Respiratory  Lungs are clear to auscultation bilaterally without wheezes, rales or rhonchi, normal air exchange without accessory muscle use.         CVS  Normal rate, regular rhythm and normal S1 and S2.  No murmurs,  gallops, or rubs.        Abdomen  Soft, nontender and nondistended, normoactive bowel sounds.  No palpable mass.  No hepatosplenomegaly.     Neuro  Grossly nonfocal with no obvious sensory or motor deficits.     MSK  Normal range of motion in general.  No edema and no tenderness.        Psych  Appropriate mood and affect.         Labs:     Recent Results (from the past 24 hour(s))     CBC WITH AUTOMATED DIFF          Collection Time: 07/31/20 10:06 AM         Result  Value  Ref Range            WBC  4.3  4.3 - 11.1 K/uL       RBC  3.96 (L)  4.05 - 5.2 M/uL       HGB  12.2  11.7 - 15.4 g/dL       HCT  38.2  35.8 - 46.3 %       MCV  96.5  79.6 - 97.8 FL       MCH  30.8  26.1 - 32.9 PG       MCHC  31.9  31.4 - 35.0 g/dL       RDW  14.9 (H)  11.9 - 14.6 %       PLATELET  448  150 - 450 K/uL       MPV  9.4  9.4 - 12.3 FL  ABSOLUTE NRBC  0.00  0.0 - 0.2 K/uL       DF  AUTOMATED          NEUTROPHILS  23 (L)  43 - 78 %       LYMPHOCYTES  54 (H)  13 - 44 %       MONOCYTES  18 (H)  4.0 - 12.0 %       EOSINOPHILS  4  0.5 - 7.8 %       BASOPHILS  1  0.0 - 2.0 %       IMMATURE GRANULOCYTES  1  0.0 - 5.0 %       ABS. NEUTROPHILS  1.0 (L)  1.7 - 8.2 K/UL       ABS. LYMPHOCYTES  2.3  0.5 - 4.6 K/UL       ABS. MONOCYTES  0.8  0.1 - 1.3 K/UL       ABS. EOSINOPHILS  0.2  0.0 - 0.8 K/UL       ABS. BASOPHILS  0.1  0.0 - 0.2 K/UL       ABS. IMM. GRANS.  0.0  0.0 - 0.5 K/UL       MAGNESIUM          Collection Time: 07/31/20 10:06 AM         Result  Value  Ref Range            Magnesium  2.2  1.8 - 2.4 mg/dL       METABOLIC PANEL, COMPREHENSIVE          Collection Time: 07/31/20 10:06 AM         Result  Value  Ref Range            Sodium  138  136 - 145 mmol/L       Potassium  3.9  3.5 - 5.1 mmol/L       Chloride  103  98 - 107 mmol/L       CO2  29  21 - 32 mmol/L       Anion gap  6 (L)  7 - 16 mmol/L       Glucose  106 (H)  65 - 100 mg/dL       BUN  17  8 - 23 MG/DL       Creatinine  1.20 (H)  0.6 - 1.0 MG/DL       GFR est AA  56 (L)   >60 ml/min/1.36m       GFR est non-AA  46 (L)  >60 ml/min/1.761m      Calcium  9.4  8.3 - 10.4 MG/DL       Bilirubin, total  0.4  0.2 - 1.1 MG/DL       ALT (SGPT)  22  12 - 65 U/L       AST (SGOT)  19  15 - 37 U/L       Alk. phosphatase  47 (L)  50 - 136 U/L       Protein, total  7.5  6.3 - 8.2 g/dL       Albumin  3.5  3.2 - 4.6 g/dL       Globulin  4.0 (H)  2.3 - 3.5 g/dL            A-G Ratio  0.9 (L)  1.2 - 3.5             Imaging:   EXAM: PET/CT  TUMOR IMAGE SKULL THIGH W (INI)   ??   INDICATION: highly suspicious for lung cancer.   ??   PROCEDURE: Following IV injection of 13.91 mCi 18 Fluoro 2 deoxyglucose (FDG)   and a standard uptake delay, PET imaging is performed from head to thighs and   axial, sagittal and coronal images were acquired. Unenhanced CT is obtained for   anatomic localization, and attenuation correction of the PET scan. Patient   preprocedure blood glucose level: 1:76m/dL.   ??   CORRELATIVE IMAGING STUDIES: CT chest dated 02/29/2020.   ??   COMPARISON PET: No prior PET/CT   ??   HISTORY: The study is requested for initial treatment strategy. .   ??   FINDINGS:   ??   HEAD/NECK:    Mildly hypermetabolic nodule within the superficial left parotid gland, favored   to represent a lymph node. No other hypermetabolic cervical lymphadenopathy.   Cerebral evaluation is limited by normal intense activity.   ??   CHEST:    Redemonstrated 6 cm pulmonary mass within the right lower lobe which   demonstrates peripheral nodular hypermetabolic activity (max SUV 8.9).   Additional nodular area inferiorly along the right posterior pleura which   demonstrates increased metabolic activity (max SUV 8.9). No hypermetabolic hilar   or mediastinal lymphadenopathy. Scattered calcified atherosclerotic disease   ??   ABDOMEN/PELVIS:    No foci of abnormal hypermetabolism. Suspected physiologic uptake within the   gastrointestinal and genitourinary tracts.   ??   SKELETON:    No foci of abnormal hypermetabolism in the axial  and visualized appendicular   skeleton.   ??   IMPRESSION   1.  The 6 cm right lower lobe pulmonary mass demonstrates peripheral nodular   hypermetabolic activity, concerning for primary lung neoplasm with central   necrosis.   2.  No hypermetabolic lymphadenopathy or distant metastatic disease.   3.  Mildly enlarged and metabolic left parotid lymph node, likely reactive in   Etiology.         Pathology:   * * *DIAGNOSIS* * * * * * * * * * * * * * * * * * * * * * * * * * * * * * * ??     ???? ?? A: ??"CHEST WALL MASS CONTENTS":   ???? ?? NECROTIC DEBRIS  WITH OUT IDENTIFIABLE VIABLE MALIGNANT TUMOR   ???? ??   B: ??"NUMBER 8 LYMPH NODE X2":   ???? ?? ANTHRACOTIC LYMPH NODE NEGATIVE FOR METASTATIC TUMOR   ???? ??   C: ??"NUMBER 7 LYMPH NODE X4":   ????  ?? FOUR FRAGMENTS OF ANTHRACOTIC LYMPH NODE NEGATIVE FOR METASTATIC   TUMOR   ???? ??   D: ??"INTERLOBAR LYMPH NODE":   ???? ?? FOUR FRAGMENTS OF ANTHRACOTIC LYMPH NODE NEGATIVE FOR METASTATIC   TUMOR   ????  ??   E: ??"RIGHT LOWER LOBE OF LUNG":   ???? ?? POORLY DIFFERENTIATED MALIGNANT TUMOR CONSISTENT WITH POORLY   DIFFERENTIATED ADENOCARCINOMA MEASURING AP PROXIMALLY ??6.4 X 5.2 X 4.6 CM   WITH EXTENSIVE NECROSIS. BRONCHIAL  AND VASCULAR MARGINS OF RESECTION ARE   NEGATIVE FOR MALIGNANT TUMOR. FOUR ANTHRACOTIC PERIBRONCHIAL LYMPH NODES   NEGATIVE FOR METASTATIC TUMOR. SPECIMEN MARKED "CHEST WALL MASS" INVOLVED   BY POORLY DIFFERENTIATED MALIGNANT TUMOR SIMILAR  TO THAT WITHIN LUNG. SEE   COMMENT          * * *DIAGNOSIS* * * * * * * * * * * * * * * * * * * * * * * * * * * * * * * ??     ???? ?? "  RIGHT LUNG MASS": ??IMMUNOHISTOCHEMICAL FINDINGS MOST CONSISTENT  WITH   ADENOCARCINOMA OF LUNG ORIGIN.           tls/04/17/2020     * * *Electronically Signed Out* * * ?? ?? ??   Sign Out Date: 04/18/2020 ??Charles A. Renella Cunas, MD           * * *PROCEDURES/ADDENDA*  * * * * * * * * * * * * * * * * * * * * ??* * * * ??     ???? ?? ?? ?? ?? ?? ?? ?? ??STF-IMMUNOHISTOCHEMISTRY     ???? ?? ?? ?? ?? ?? ?? ?? ?? ?? ?? ?? ?? ?? ?? ?? ?? ?? ??  ?? ?? ?? ??  ?? ?? ?? ?? ?? ?? ?? ?? ?? ?? ?? ?? ?? ??   ??Status: ??Reviewed By:   ??Charles A. Lorel Monaco, III, MD on 04/18/2020   ???? ??   ???? ?? ?? ?? ?? ?? ?? ?? ??  ?? ?? Interpretation   ???? ?? ?? ?? ?? ?? ?? ?? ?? ??Immunohistochemical Stain Panel:     ???? ?? Interpretation: ??Immunohistochemical findings most consistent with   adenocarcinoma of lung origin.      Antibody/Test ?? ?? ?? ?? ?? ?? ?? Marker For ?? ?? ?? ?? ?? ?? ?? ?? ?? ?? ?? ?? ?? ?? ??Result   CK 5/6 ?? ?? ?? ?? ?? ?? ?? ?? ?? ??Squamous cell carcinomas and mesotheliomas ??  ?? ??   ???? ??Negative   p40 ?? ?? ?? ?? ?? ?? ?? ?? ?? ??Squamous differentiation ?? ?? ?? ?? ?? ?? ?? ?? ?? ??   Negative   TTF-1 ?? ?? ?? ?? ?? ?? ?? ?? ?? ?? Lung  and thyroid adenocarcinoma ?? ?? ?? ?? ?? ?? ??   Positive   Napsin ?? ?? ?? ?? ?? ?? ?? ?? ?? ?? Lung adenocarcinoma ?? ?? ?? ?? ?? ?? ?? ?? ?? ?? ?? ??   Negative ??                ASSESSMENT:             ICD-10-CM  ICD-9-CM             1.  Adenocarcinoma of right lung (HCC)   C34.91  162.9       2.  Chemotherapy induced neutropenia (HCC)   D70.1  288.03              T45.1X5A  E933.1             Problem List   Date Reviewed:  08/20/20                        Codes  Class  Noted             Adenocarcinoma of right lung, stage 2 ([place])  ICD-10-CM: C34.91   ICD-9-CM: 162.9    06/12/2020                       Side effect of medication  ICD-10-CM: T88.7XXA   ICD-9-CM: 995.20    06/12/2020                       Large cell carcinoma of lung, right ([place])  ICD-10-CM: C34.91   ICD-9-CM: 162.9    05/07/2020  Bronchogenic cancer of right lung [facility])  ICD-10-CM: C34.91   ICD-9-CM: 162.9    04/28/2020                       Right lower lobe lung mass  ICD-10-CM: R91.8   ICD-9-CM: 786.6    03/20/2020                       Dry skin dermatitis  ICD-10-CM: L85.3   ICD-9-CM: 692.89    01/03/2020                       Bilateral leg cramps  ICD-10-CM: R25.2   ICD-9-CM: 729.82    01/03/2020                       Elevated blood sugar  ICD-10-CM: R73.9   ICD-9-CM: 790.29    01/03/2020                       Hyperlipidemia  ICD-10-CM: E78.5   ICD-9-CM: 272.4     01/04/2019                       Chronic obstructive pulmonary disease ([place])  ICD-10-CM: J44.9   ICD-9-CM: 578    01/04/2019                       Pre-ulcerative corn or callous  ICD-10-CM: L84   ICD-9-CM: 700    06/25/2018                       Chronic pain of left knee  ICD-10-CM: M25.562, G89.29   ICD-9-CM: 719.46, 338.29    01/26/2018          Overview Signed 02/29/2020 12:10 PM by Selinda Orion, NP            Last Assessment & Plan:    Formatting of this note might be different from the original.   Discussed in detail today.  We will see if we can get Visco therapy approved.  Referral to PT.                                   Prediabetes  ICD-10-CM: R73.03   ICD-9-CM: 790.29    06/10/2016                       Chronic right-sided low back pain without sciatica  ICD-10-CM: M54.50, G89.29   ICD-9-CM: 724.2, 338.29    02/12/2016                       Environmental allergies  ICD-10-CM: Z91.09   ICD-9-CM: V15.09    10/05/2014                       Rheumatoid arthritis involving multiple sites with positive rheumatoid factor (HCC)  ICD-10-CM: M05.79   ICD-9-CM: 714.0    01/30/2014          Overview Signed 02/29/2020 12:10 PM by Selinda Orion, NP            Last Assessment & Plan:    Formatting of this note might be different from the original.   Chronic/Stable. Denies any recent  RA flares. Patient is taking MTX and folic acid. Denies any adverse side effects. Continue with current treatment plan. Reviewed previous labs. Ordered labs today. Will continue to monitor over time.      Instructed patient to contact our office with any new or worsening symptoms.                                   Hypothyroidism  ICD-10-CM: E03.9   ICD-9-CM: 244.9    01/30/2014                       Vitamin D deficiency  ICD-10-CM: E55.9   ICD-9-CM: 268.9    01/30/2014          Overview Signed 02/29/2020 12:10 PM by Selinda Orion, NP            Last Assessment & Plan:    Formatting of this note might be different from the original.   Encouraged  appropriate vitamin D supplementation. Monitor for signs or symptoms of hypercalcemia.                                   Osteoporosis  ICD-10-CM: M81.0   ICD-9-CM: 733.00    01/30/2014                       Gastroesophageal reflux disease without esophagitis  ICD-10-CM: K21.9   ICD-9-CM: 530.81    01/30/2014                       HTN (hypertension)  ICD-10-CM: I10   ICD-9-CM: 401.9    01/30/2014                             PLAN:   Lab studies were personally reviewed.      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  PET/CT shows no distant disease.  Clinically T3N0M0.  She completed VATS with left lower lobectomy as well as chest wall mass resection  and lymphadenectomy on 05/07/20.  She tolerated this reasonably well, still with some post-op tenderness but nothing unmanageable.  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  13 lymph node fragments were all negative for tumor.  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  We do also have the ALCHEMIST trial available, but unfortunately she is not a candidate due to her history of RA with MTX use.  She started Cis/Alimta on 07/11/2020.          She is here today for follow up and consideration of cycle 2 of Cis/Alimta.  She tolerated cycle 1 reasonably well, she feels like she is doing excellent.  Her nausea was significant the first few days, her husband notes that once she started taking the  antiemetic pills she was much better.  No mucositis or bowel changes.  Energy levels are back to normal, ECOG 0.  She denies  any pain.  She has some right abdominal wall fullness related to recent surgery, some improvement over  time.  No fevers or infectious  symptoms.  Labs reviewed, her [place] is only up to 1000 and her creatinine is up to 1.2 (GFR 46).  For this reason I would hold therapy for one week, the pemetrexed PI calls for ANC above 1500 for each cycle.  We will recheck in 1 week and plan for cycle  2 at that time.  Continue supportive care as planned.  All questions were asked and answered to the best of my ability.                Greer Ee, MD   [facility] [place] [address]   Office : [phone]   Fax : [phone]

## 2020-08-01 ENCOUNTER — Ambulatory Visit: Payer: MEDICARE | Primary: Internal Medicine

## 2020-08-07 ENCOUNTER — Inpatient Hospital Stay: Admit: 2020-08-07 | Payer: MEDICARE | Primary: Internal Medicine

## 2020-08-07 DIAGNOSIS — Z5111 Encounter for antineoplastic chemotherapy: Secondary | ICD-10-CM

## 2020-08-07 LAB — CBC WITH AUTO DIFFERENTIAL
Basophils %: 1 % (ref 0.0–2.0)
Basophils Absolute: 0.1 10*3/uL (ref 0.0–0.2)
Eosinophils %: 1 % (ref 0.5–7.8)
Eosinophils Absolute: 0.1 10*3/uL (ref 0.0–0.8)
Granulocyte Absolute Count: 0 10*3/uL (ref 0.0–0.5)
Hematocrit: 36.1 % (ref 35.8–46.3)
Hemoglobin: 11.9 g/dL (ref 11.7–15.4)
Immature Granulocytes: 1 % (ref 0.0–5.0)
Lymphocytes %: 29 % (ref 13–44)
Lymphocytes Absolute: 2.1 10*3/uL (ref 0.5–4.6)
MCH: 31.5 PG (ref 26.1–32.9)
MCHC: 33 g/dL (ref 31.4–35.0)
MCV: 95.5 FL (ref 79.6–97.8)
MPV: 9.7 FL (ref 9.4–12.3)
Monocytes %: 13 % — ABNORMAL HIGH (ref 4.0–12.0)
Monocytes Absolute: 0.9 10*3/uL (ref 0.1–1.3)
NRBC Absolute: 0 10*3/uL (ref 0.0–0.2)
Neutrophils %: 55 % (ref 43–78)
Neutrophils Absolute: 4.1 10*3/uL (ref 1.7–8.2)
Platelets: 279 10*3/uL (ref 150–450)
RBC: 3.78 M/uL — ABNORMAL LOW (ref 4.05–5.2)
RDW: 14.6 % (ref 11.9–14.6)
WBC: 7.3 10*3/uL (ref 4.3–11.1)

## 2020-08-07 LAB — COMPREHENSIVE METABOLIC PANEL
ALT: 18 U/L (ref 12–65)
AST: 20 U/L (ref 15–37)
Albumin/Globulin Ratio: 0.9 — ABNORMAL LOW (ref 1.2–3.5)
Albumin: 3.4 g/dL (ref 3.2–4.6)
Alkaline Phosphatase: 46 U/L — ABNORMAL LOW (ref 50–136)
Anion Gap: 5 mmol/L — ABNORMAL LOW (ref 7–16)
BUN: 13 MG/DL (ref 8–23)
CO2: 27 mmol/L (ref 21–32)
Calcium: 8.9 MG/DL (ref 8.3–10.4)
Chloride: 107 mmol/L (ref 98–107)
Creatinine: 1 MG/DL (ref 0.6–1.0)
EGFR IF NonAfrican American: 57 mL/min/{1.73_m2} — ABNORMAL LOW (ref >60)
GFR African American: >60 mL/min/{1.73_m2} (ref >60)
Globulin: 3.6 g/dL — ABNORMAL HIGH (ref 2.3–3.5)
Glucose: 100 mg/dL (ref 65–100)
Potassium: 3.7 mmol/L (ref 3.5–5.1)
Sodium: 139 mmol/L (ref 136–145)
Total Bilirubin: 0.6 MG/DL (ref 0.2–1.1)
Total Protein: 7 g/dL (ref 6.3–8.2)

## 2020-08-07 LAB — PHOSPHORUS
Phosphorus: 3.4 MG/DL (ref 2.3–3.7)
Phosphorus: 3.4 MG/DL (ref 2.3–3.7)

## 2020-08-07 LAB — METABOLIC PANEL, COMPREHENSIVE
A-G Ratio: 0.9 — ABNORMAL LOW (ref 1.2–3.5)
ALT (SGPT): 18 U/L (ref 12–65)
AST (SGOT): 20 U/L (ref 15–37)
Albumin: 3.4 g/dL (ref 3.2–4.6)
Alk. phosphatase: 46 U/L — ABNORMAL LOW (ref 50–136)
Anion gap: 5 mmol/L — ABNORMAL LOW (ref 7–16)
BUN: 13 MG/DL (ref 8–23)
Bilirubin, total: 0.6 MG/DL (ref 0.2–1.1)
CO2: 27 mmol/L (ref 21–32)
Calcium: 8.9 MG/DL (ref 8.3–10.4)
Chloride: 107 mmol/L (ref 98–107)
Creatinine: 1 MG/DL (ref 0.6–1.0)
GFR est AA: >60 mL/min/{1.73_m2} (ref >60)
GFR est non-AA: 57 mL/min/{1.73_m2} — ABNORMAL LOW (ref >60)
Globulin: 3.6 g/dL — ABNORMAL HIGH (ref 2.3–3.5)
Glucose: 100 mg/dL (ref 65–100)
Potassium: 3.7 mmol/L (ref 3.5–5.1)
Protein, total: 7 g/dL (ref 6.3–8.2)
Sodium: 139 mmol/L (ref 136–145)

## 2020-08-07 LAB — CBC WITH AUTOMATED DIFF
ABS. BASOPHILS: 0.1 10*3/uL (ref 0.0–0.2)
ABS. EOSINOPHILS: 0.1 10*3/uL (ref 0.0–0.8)
ABS. IMM. GRANS.: 0 10*3/uL (ref 0.0–0.5)
ABS. LYMPHOCYTES: 2.1 10*3/uL (ref 0.5–4.6)
ABS. MONOCYTES: 0.9 10*3/uL (ref 0.1–1.3)
ABS. NEUTROPHILS: 4.1 10*3/uL (ref 1.7–8.2)
ABSOLUTE NRBC: 0 10*3/uL (ref 0.0–0.2)
BASOPHILS: 1 % (ref 0.0–2.0)
EOSINOPHILS: 1 % (ref 0.5–7.8)
HCT: 36.1 % (ref 35.8–46.3)
HGB: 11.9 g/dL (ref 11.7–15.4)
IMMATURE GRANULOCYTES: 1 % (ref 0.0–5.0)
LYMPHOCYTES: 29 % (ref 13–44)
MCH: 31.5 PG (ref 26.1–32.9)
MCHC: 33 g/dL (ref 31.4–35.0)
MCV: 95.5 FL (ref 79.6–97.8)
MONOCYTES: 13 % — ABNORMAL HIGH (ref 4.0–12.0)
MPV: 9.7 FL (ref 9.4–12.3)
NEUTROPHILS: 55 % (ref 43–78)
PLATELET: 279 10*3/uL (ref 150–450)
RBC: 3.78 M/uL — ABNORMAL LOW (ref 4.05–5.2)
RDW: 14.6 % (ref 11.9–14.6)
WBC: 7.3 10*3/uL (ref 4.3–11.1)

## 2020-08-07 MED ORDER — DEXAMETHASONE SODIUM PHOSPHATE 4 MG/ML IJ SOLN
4 mg/mL | Freq: Once | INTRAMUSCULAR | Status: AC
Start: 2020-08-07 — End: 2020-08-07
  Administered 2020-08-07: 16:00:00 via INTRAVENOUS

## 2020-08-07 MED ORDER — CISPLATIN 1 MG/ML IV SOLN
1 mg/mL | Freq: Once | INTRAVENOUS | Status: AC
Start: 2020-08-07 — End: 2020-08-07
  Administered 2020-08-07: 17:00:00 via INTRAVENOUS

## 2020-08-07 MED ORDER — ONDANSETRON (PF) 4 MG/2 ML INJECTION
4 mg/2 mL | Freq: Once | INTRAMUSCULAR | Status: AC
Start: 2020-08-07 — End: 2020-08-07
  Administered 2020-08-07: 16:00:00 via INTRAVENOUS

## 2020-08-07 MED ORDER — PEMETREXED 500 MG IV SOLUTION
500 mg | Freq: Once | INTRAVENOUS | Status: AC
Start: 2020-08-07 — End: 2020-08-07
  Administered 2020-08-07: 17:00:00 via INTRAVENOUS

## 2020-08-07 MED ORDER — SODIUM CHLORIDE 0.9 % IV
150 mg | Freq: Once | INTRAVENOUS | Status: AC
Start: 2020-08-07 — End: 2020-08-07
  Administered 2020-08-07: 16:00:00 via INTRAVENOUS

## 2020-08-07 MED ORDER — SODIUM CHLORIDE 0.9 % IJ SYRG
INTRAMUSCULAR | Status: AC | PRN
Start: 2020-08-07 — End: 2020-08-07
  Administered 2020-08-07 (×2): via INTRAVENOUS

## 2020-08-07 MED ORDER — POTASSIUM CHLORIDE 2 MEQ/ML IV SOLN
2 mEq/mL | Freq: Once | INTRAVENOUS | Status: AC
Start: 2020-08-07 — End: 2020-08-07
  Administered 2020-08-07: 15:00:00 via INTRAVENOUS

## 2020-08-07 MED ORDER — SODIUM CHLORIDE 0.9 % IV
INTRAVENOUS | Status: AC
Start: 2020-08-07 — End: 2020-08-07
  Administered 2020-08-07: 14:00:00 via INTRAVENOUS

## 2020-08-07 MED ORDER — MAGNESIUM SULFATE 50 % (4 MEQ/ML) INJECTION
450 mEq/mL (50 %) | Freq: Once | INTRAMUSCULAR | Status: AC
Start: 2020-08-07 — End: 2020-08-07
  Administered 2020-08-07: 19:00:00 via INTRAVENOUS

## 2020-08-07 MED FILL — FOSAPREPITANT 150 MG IV SOLUTION: 150 mg | INTRAVENOUS | Qty: 5

## 2020-08-07 MED FILL — SODIUM CHLORIDE 0.9 % IV: INTRAVENOUS | Qty: 1000

## 2020-08-07 MED FILL — ALIMTA 500 MG INTRAVENOUS SOLUTION: 500 mg | INTRAVENOUS | Qty: 20

## 2020-08-07 MED FILL — DEXAMETHASONE SODIUM PHOSPHATE 4 MG/ML IJ SOLN: 4 mg/mL | INTRAMUSCULAR | Qty: 3

## 2020-08-07 MED FILL — CISPLATIN 1 MG/ML IV SOLN: 1 mg/mL | INTRAVENOUS | Qty: 139.5

## 2020-08-07 MED FILL — ONDANSETRON (PF) 4 MG/2 ML INJECTION: 4 mg/2 mL | INTRAMUSCULAR | Qty: 4

## 2020-08-07 NOTE — Progress Notes (Signed)
Patient arrived ambulatory to infusion area. C2D1 Alimta/Cisplatin completed. Patient tolerated treatment well. Port de-accessed. Discharged ambulatory with spouse. Patient aware of next infusion appt on 08/28/20.

## 2020-08-09 ENCOUNTER — Encounter: Admit: 2020-08-09 | Discharge: 2020-08-09 | Payer: MEDICARE | Primary: Internal Medicine

## 2020-08-09 LAB — MISC. LAB TEST

## 2020-08-21 ENCOUNTER — Encounter: Attending: Family | Primary: Internal Medicine

## 2020-08-21 ENCOUNTER — Encounter: Primary: Internal Medicine

## 2020-08-22 ENCOUNTER — Ambulatory Visit: Payer: MEDICARE | Primary: Internal Medicine

## 2020-08-27 ENCOUNTER — Ambulatory Visit: Attending: Family | Primary: Internal Medicine

## 2020-08-27 ENCOUNTER — Ambulatory Visit: Admit: 2020-08-27 | Discharge: 2020-08-27 | Payer: MEDICARE | Attending: Family | Primary: Internal Medicine

## 2020-08-27 ENCOUNTER — Inpatient Hospital Stay: Admit: 2020-08-27 | Payer: MEDICARE | Primary: Internal Medicine

## 2020-08-27 ENCOUNTER — Encounter

## 2020-08-27 DIAGNOSIS — C3491 Malignant neoplasm of unspecified part of right bronchus or lung: Secondary | ICD-10-CM

## 2020-08-27 LAB — CBC WITH AUTO DIFFERENTIAL
Basophils %: 1 % (ref 0.0–2.0)
Basophils Absolute: 0 10*3/uL (ref 0.0–0.2)
Eosinophils %: 3 % (ref 0.5–7.8)
Eosinophils Absolute: 0.1 10*3/uL (ref 0.0–0.8)
Granulocyte Absolute Count: 0 10*3/uL (ref 0.0–0.5)
Hematocrit: 33.4 % — ABNORMAL LOW (ref 35.8–46.3)
Hemoglobin: 11 g/dL — ABNORMAL LOW (ref 11.7–15.4)
Immature Granulocytes: 0 % (ref 0.0–5.0)
Lymphocytes %: 52 % — ABNORMAL HIGH (ref 13–44)
Lymphocytes Absolute: 1.9 10*3/uL (ref 0.5–4.6)
MCH: 31.8 PG (ref 26.1–32.9)
MCHC: 32.9 g/dL (ref 31.4–35.0)
MCV: 96.5 FL (ref 79.6–97.8)
MPV: 9.9 FL (ref 9.4–12.3)
Monocytes %: 22 % — ABNORMAL HIGH (ref 4.0–12.0)
Monocytes Absolute: 0.8 10*3/uL (ref 0.1–1.3)
NRBC Absolute: 0 10*3/uL (ref 0.0–0.2)
Neutrophils %: 22 % — ABNORMAL LOW (ref 43–78)
Neutrophils Absolute: 0.8 10*3/uL — ABNORMAL LOW (ref 1.7–8.2)
Platelets: 294 10*3/uL (ref 150–450)
RBC: 3.46 M/uL — ABNORMAL LOW (ref 4.05–5.2)
RDW: 15.6 % — ABNORMAL HIGH (ref 11.9–14.6)
WBC: 3.6 10*3/uL — ABNORMAL LOW (ref 4.3–11.1)

## 2020-08-27 LAB — COMPREHENSIVE METABOLIC PANEL
ALT: 12 U/L (ref 12–65)
AST: 12 U/L — ABNORMAL LOW (ref 15–37)
Albumin/Globulin Ratio: 1.1 — ABNORMAL LOW (ref 1.2–3.5)
Albumin: 3.5 g/dL (ref 3.2–4.6)
Alkaline Phosphatase: 41 U/L — ABNORMAL LOW (ref 50–136)
Anion Gap: 5 mmol/L — ABNORMAL LOW (ref 7–16)
BUN: 11 MG/DL (ref 8–23)
CO2: 28 mmol/L (ref 21–32)
Calcium: 9.4 MG/DL (ref 8.3–10.4)
Chloride: 104 mmol/L (ref 98–107)
Creatinine: 1 MG/DL (ref 0.6–1.0)
EGFR IF NonAfrican American: 57 mL/min/{1.73_m2} — ABNORMAL LOW (ref >60)
GFR African American: >60 mL/min/{1.73_m2} (ref >60)
Globulin: 3.3 g/dL (ref 2.3–3.5)
Glucose: 103 mg/dL — ABNORMAL HIGH (ref 65–100)
Potassium: 3.6 mmol/L (ref 3.5–5.1)
Sodium: 137 mmol/L (ref 136–145)
Total Bilirubin: 0.7 MG/DL (ref 0.2–1.1)
Total Protein: 6.8 g/dL (ref 6.3–8.2)

## 2020-08-27 LAB — MAGNESIUM
Magnesium: 2.3 mg/dL (ref 1.8–2.4)
Magnesium: 2.3 mg/dL (ref 1.8–2.4)

## 2020-08-27 LAB — CBC WITH AUTOMATED DIFF
ABS. BASOPHILS: 0 10*3/uL (ref 0.0–0.2)
ABS. EOSINOPHILS: 0.1 10*3/uL (ref 0.0–0.8)
ABS. IMM. GRANS.: 0 10*3/uL (ref 0.0–0.5)
ABS. LYMPHOCYTES: 1.9 10*3/uL (ref 0.5–4.6)
ABS. MONOCYTES: 0.8 10*3/uL (ref 0.1–1.3)
ABS. NEUTROPHILS: 0.8 10*3/uL — ABNORMAL LOW (ref 1.7–8.2)
ABSOLUTE NRBC: 0 10*3/uL (ref 0.0–0.2)
BASOPHILS: 1 % (ref 0.0–2.0)
EOSINOPHILS: 3 % (ref 0.5–7.8)
HCT: 33.4 % — ABNORMAL LOW (ref 35.8–46.3)
HGB: 11 g/dL — ABNORMAL LOW (ref 11.7–15.4)
IMMATURE GRANULOCYTES: 0 % (ref 0.0–5.0)
LYMPHOCYTES: 52 % — ABNORMAL HIGH (ref 13–44)
MCH: 31.8 PG (ref 26.1–32.9)
MCHC: 32.9 g/dL (ref 31.4–35.0)
MCV: 96.5 FL (ref 79.6–97.8)
MONOCYTES: 22 % — ABNORMAL HIGH (ref 4.0–12.0)
MPV: 9.9 FL (ref 9.4–12.3)
NEUTROPHILS: 22 % — ABNORMAL LOW (ref 43–78)
PLATELET: 294 10*3/uL (ref 150–450)
RBC: 3.46 M/uL — ABNORMAL LOW (ref 4.05–5.2)
RDW: 15.6 % — ABNORMAL HIGH (ref 11.9–14.6)
WBC: 3.6 10*3/uL — ABNORMAL LOW (ref 4.3–11.1)

## 2020-08-27 LAB — METABOLIC PANEL, COMPREHENSIVE
A-G Ratio: 1.1 — ABNORMAL LOW (ref 1.2–3.5)
ALT (SGPT): 12 U/L (ref 12–65)
AST (SGOT): 12 U/L — ABNORMAL LOW (ref 15–37)
Albumin: 3.5 g/dL (ref 3.2–4.6)
Alk. phosphatase: 41 U/L — ABNORMAL LOW (ref 50–136)
Anion gap: 5 mmol/L — ABNORMAL LOW (ref 7–16)
BUN: 11 MG/DL (ref 8–23)
Bilirubin, total: 0.7 MG/DL (ref 0.2–1.1)
CO2: 28 mmol/L (ref 21–32)
Calcium: 9.4 MG/DL (ref 8.3–10.4)
Chloride: 104 mmol/L (ref 98–107)
Creatinine: 1 MG/DL (ref 0.6–1.0)
GFR est AA: >60 mL/min/{1.73_m2} (ref >60)
GFR est non-AA: 57 mL/min/{1.73_m2} — ABNORMAL LOW (ref >60)
Globulin: 3.3 g/dL (ref 2.3–3.5)
Glucose: 103 mg/dL — ABNORMAL HIGH (ref 65–100)
Potassium: 3.6 mmol/L (ref 3.5–5.1)
Protein, total: 6.8 g/dL (ref 6.3–8.2)
Sodium: 137 mmol/L (ref 136–145)

## 2020-08-27 NOTE — Progress Notes (Signed)
Progress  Notes by Andrew Au, NP at 08/27/20 1100                Author: Andrew Au, NP  Service: --  Author Type: Nurse Practitioner       Filed: 08/27/20 1240  Encounter Date: 08/27/2020  Status: Signed          Editor: Andrew Au, NP (Nurse Practitioner)               [facility] Hematology and Oncology: Office Visit Established Patient      Chief Complaint:       Chief Complaint       Patient presents with        ?  Follow-up              History of Present Illness:   Ms. Albano is a  80 y.o. female who presents today for follow-up regarding resected adenocarcinoma of the  lung.  On 02/27/20, Ms. Muro presented to [facility] ED with complaints of achiness in chest with radiation of pain to her left upper extremity with association of N/V, SOB and diaphoresis. Cardiac work-up was negative; however her chest x-ray reported a 5.1  cm rounded opacity at the right lung base.  She saw her PCP on 02/29/20, CT scan was ordered and reported a 5 cm mass with adjacent pleural nodularity posteriorly at the right lung base and considered suspicious for bronchogenic carcinoma. On 04/02/20 she  had a PET/CT scan that reported a 6 cm right lower lobe pulmonary mass with no metastatic disease.  Biopsy showed adenocarcinoma of lung origin.  We saw her prior to surgical evaluation and recommended proceeding with lobectomy and lymphadenectomy if  she was a surgical candidate.  She completed VATS with left lower lobectomy as well as chest wall mass resection and lymphadenectomy on 05/07/20.  Her tumor was 6.4 cm in greatest dimension with carcinoma in the specimen marked "chest wall" but with separation  from tumor by connective tissue and lung parenchyma.  13 lymph node fragments were all negative for tumor.  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  Because of the T3N0 disease, we would consider adjuvant  chemotherapy, likely cisplatin and pemetrexed for 4 cycles.  We do also  have the ALCHEMIST trial available, but unfortunately she is not a candidate due to her history of RA with MTX use.        She is here today for follow up and consideration of cycle 3 of Cis/Alimta. She tolerated last cycle better. Nausea improved with antiemetics. No mucositis. No GI complaints. Abdominal pain improving. She notes no other concerns. No infectious symptoms.       Review of Systems:   Constitutional: Negative.    HENT: Negative.    Eyes: Negative.    Respiratory: Negative.    Cardiovascular: Negative.    Gastrointestinal: Negative.    Genitourinary: Negative.    Musculoskeletal: Negative.    Skin: Negative.    Neurological: Negative.    Endo/Heme/Allergies: Negative.    Psychiatric/Behavioral: Negative.    All other systems reviewed and are negative.            Allergies        Allergen  Reactions         ?  Yeast  Nausea and Vomiting             Pt. Says she  hasnt had issues with this in a long time          Past Medical History:        Diagnosis  Date         ?  Arrhythmia  04/05/2020          PVCs.  04/05/20 was referred to [facility] for tachycardia, was seen 05/01/20 [facility]         ?  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  Autoimmune disease ([place])            RA         ?  Bronchogenic cancer of right lung ([place])  04/28/2020          adenocarcinoma         ?  Chronic obstructive pulmonary disease (HCC)            uses inhalers         ?  Chronic pain of left knee  06/25/2018     ?  Coronary atherosclerosis            noted on chest CT         ?  COVID-19 vaccine series completed  08/15/2019          Pfizer; 07/29/2019 and booster given 03/12/2020         ?  GERD (gastroesophageal reflux disease)            no meds at this time         ?  H/O cardiovascular stress test            Bruce protocol, low risk study, EF of 67% and normal LV function         ?  H/O mammogram  07/28/2016          mammogram and  Ultrasound of right breaset- benign finding- [facility]          ?  Hearing loss            "only hears out of left ear"         ?  Hearing loss of both ears            no hearing aids          ?  HTN (hypertension), benign  01/30/2014          controlled w/med         ?  Hypothyroid            on med for control         ?  Menopause       ?  Osteoarthritis       ?  Osteoporosis            prolia          ?  Poor historian            patient had trouble remembering names of meds and what they were for and had to repeat instructions for surgery several times          ?  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 Debbra Riding. Methotrexate Rx         ?  Sinusitis  sinus flonase          ?  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 Nancie Neas         ?  Unspecified hypothyroidism  01/30/2014          thyroid medication         ?  Vitamin D deficiency            medications for this           Past Surgical History:         Procedure  Laterality  Date          ?  COLONOSCOPY  N/A  01/27/2018          COLONOSCOPY/BMI 26 performed by Azalia Bilis, MD at [facility]          ?  HX CARPAL TUNNEL RELEASE  Bilateral       ?  HX COLONOSCOPY              colon polyps-adenomatous, has seen Dr. Kristopher Glee in past          ?  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  HX HEMORRHOIDECTOMY         ?  HX OTHER SURGICAL    09/2012          sinus surgery-Dr. Mickle Mallory          ?  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  HX TONSILLECTOMY    80 yrs old     ?  PR COLONOSCOPY FLX DX W/COLLJ SPEC WHEN PFRMD    01/27/2018                     Family History         Problem  Relation  Age of Onset          ?  OSTEOARTHRITIS  Mother       ?  Alzheimer's Disease  Father            ?  Breast Cancer  Neg Hx            Social History          Socioeconomic History         ?  Marital status:  MARRIED              Spouse name:  Not on file         ?  Number  of children:  Not on file     ?  Years of education:  Not on file     ?  Highest education level:  Not on file       Occupational History        ?  Not on file       Tobacco Use         ?  Smoking status:  Former Smoker              Packs/day:  0.50         Years:  43.00         Pack years:  21.50  Types:  Cigarettes         Start date:  61         Quit date:  14         Years since quitting:  37.1         ?  Smokeless tobacco:  Never Used        ?  Tobacco comment: not regular when first started- 1pk/week       Vaping Use         ?  Vaping Use:  Never used       Substance and Sexual Activity         ?  Alcohol use:  No              Alcohol/week:  0.0 standard drinks         ?  Drug use:  No     ?  Sexual activity:  Not on file        Other Topics  Concern        ?  Not on file       Social History Narrative          Married and lives with husband.  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          Social Determinants of Health          Financial Resource Strain:         ?  Difficulty of Paying Living Expenses: Not on file       Food Insecurity:         ?  Worried About Running Out of Food in the Last Year: Not on file     ?  Ran Out of Food in the Last Year: Not on file       Transportation Needs:         ?  Lack of Transportation (Medical): Not on file     ?  Lack of Transportation (Non-Medical): Not on file       Physical Activity:         ?  Days of Exercise per Week: Not on file     ?  Minutes of Exercise per Session: Not on file       Stress:         ?  Feeling of Stress : Not on file       Social Connections:         ?  Frequency of Communication with Friends and Family: Not on file     ?  Frequency of Social Gatherings with Friends and Family: Not on file     ?  Attends Religious Services: Not on file     ?  Active Member of Clubs or Organizations: Not on file     ?  Attends Archivist Meetings: Not on file     ?  Marital Status: Not on file       Intimate Partner Violence:         ?  Fear of  Current or Ex-Partner: Not on file     ?  Emotionally Abused: Not on file     ?  Physically Abused: Not on file     ?  Sexually Abused: Not on file       Housing Stability:         ?  Unable to Pay for Housing in the Last Year: Not on file     ?  Number of Places Lived in the Last Year: Not on file        ?  Unstable Housing in the Last Year: Not on file          Current Outpatient Medications          Medication  Sig  Dispense  Refill           ?  losartan (COZAAR) 100 mg tablet  TAKE 1 TABLET BY MOUTH DAILY  90 Tablet  3     ?  ondansetron hcl (ZOFRAN) 8 mg tablet  Take 1 Tablet by mouth every eight (8) hours as needed for Nausea or Vomiting.  90 Tablet  1     ?  prochlorperazine (Compazine) 10 mg tablet  Take 1 Tablet by mouth every six (6) hours as needed for Nausea or Vomiting.  90 Tablet  1     ?  OTHER,NON-FORMULARY,  daily. Prevagen         ?  folic acid (FOLVITE) 1 mg tablet  Take 1 Tablet by mouth daily.  90 Tablet  3     ?  lidocaine-prilocaine (EMLA) topical cream  Apply  to affected area as needed for Pain.  30 g  0     ?  umeclidinium-vilanteroL (Anoro Ellipta) 62.5-25 mcg/actuation inhaler  Take 1 Puff by inhalation daily. (Patient taking differently: Take 1 Puff by inhalation as needed.)  1 Each  11     ?  atorvastatin (LIPITOR) 20 mg tablet  Take 20 mg by mouth daily.         ?  ergocalciferol (ERGOCALCIFEROL) 1,250 mcg (50,000 unit) capsule  Take 50,000 Units by mouth every fourteen (14) days.         ?  levothyroxine (SYNTHROID) 100 mcg tablet  Take 1 Tablet by mouth Daily (before breakfast). (Patient taking differently: Take 125 mcg by mouth Daily (before breakfast).)  30 Tablet  11     ?  albuterol (PROVENTIL HFA, VENTOLIN HFA, PROAIR HFA) 90 mcg/actuation inhaler  Take 1 Puff by inhalation every six (6) hours as needed for Wheezing.  1 Inhaler  5     ?  fluticasone propionate (FLONASE) 50 mcg/actuation nasal spray  2 Sprays by Both Nostrils route daily. (Patient taking differently: 2 Sprays by  Both Nostrils route daily as needed.)  1 Bottle  5     ?  denosumab (PROLIA) 60 mg/mL injection  60 mg by SubCUTAneous route every 6 months. Due December 2021         ?  methotrexate, PF, 25 mg/mL injection  25 mg every seven (7) days. Friday evening         ?  multivitamin (ONE A DAY) tablet  Take 1 Tab by mouth daily.         ?  magnesium 250 mg tab  Take  by mouth daily. (Patient not taking: Reported on 08/27/2020)         ?  metoprolol succinate (TOPROL-XL) 50 mg XL tablet  Take 50 mg by mouth daily. (Patient not taking: Reported on 08/27/2020)         ?  mupirocin (BACTROBAN) 2 % ointment  Apply  to affected area daily. (Patient not taking: Reported on 07/31/2020)  15 g  1     ?  gabapentin (NEURONTIN) 100 mg capsule  Take 100 mg by mouth three (3) times daily as needed. (Patient not taking: Reported on 08/27/2020)               ?  nicotinic acid (NIACIN) 500 mg tablet  Take 500 mg by mouth Daily (before breakfast). 2 po everyday.  (Patient not taking: Reported on 08/27/2020)               OBJECTIVE:   Visit Vitals       BP  112/74  Comment: standing        Pulse  66     Temp  97.8 ??F (36.6 ??C) (Oral)     Resp  16     Wt  155 lb (70.3 kg)     SpO2  96%        BMI  22.24 kg/m??           Physical Exam:      Constitutional:  Well developed, well nourished female in no acute  distress, sitting comfortably on the examination table.         HEENT:  Normocephalic and atraumatic. Sclerae anicteric. Neck supple without JVD. No thyromegaly present.      Lymph node     Deferred.          Skin  Warm and dry.  No bruising and no rash noted.  No erythema.  No pallor.         Respiratory  Lungs are clear to auscultation bilaterally without wheezes, rales or rhonchi, normal air exchange without accessory muscle use.      CVS  Normal rate, regular rhythm and normal S1 and S2.  No murmurs, gallops, or rubs.     Abdomen  Soft, nontender and nondistended, normoactive bowel sounds.  No palpable mass.  No hepatosplenomegaly.     Neuro   Grossly nonfocal with no obvious sensory or motor deficits.     MSK  Normal range of motion in general.  No edema and no tenderness.        Psych  Appropriate mood and affect.         Labs:     Recent Results (from the past 24 hour(s))     CBC WITH AUTOMATED DIFF          Collection Time: 08/27/20 10:36 AM         Result  Value  Ref Range            WBC  3.6 (L)  4.3 - 11.1 K/uL       RBC  3.46 (L)  4.05 - 5.2 M/uL       HGB  11.0 (L)  11.7 - 15.4 g/dL       HCT  33.4 (L)  35.8 - 46.3 %       MCV  96.5  79.6 - 97.8 FL       MCH  31.8  26.1 - 32.9 PG       MCHC  32.9  31.4 - 35.0 g/dL       RDW  15.6 (H)  11.9 - 14.6 %       PLATELET  294  150 - 450 K/uL       MPV  9.9  9.4 - 12.3 FL       ABSOLUTE NRBC  0.00  0.0 - 0.2 K/uL       NEUTROPHILS  22 (L)  43 - 78 %       LYMPHOCYTES  52 (H)  13 - 44 %       MONOCYTES  22 (H)  4.0 - 12.0 %       EOSINOPHILS  3  0.5 - 7.8 %       BASOPHILS  1  0.0 - 2.0 %       IMMATURE GRANULOCYTES  0  0.0 - 5.0 %       ABS. NEUTROPHILS  0.8 (L)  1.7 - 8.2 K/UL       ABS. LYMPHOCYTES  1.9  0.5 - 4.6 K/UL       ABS. MONOCYTES  0.8  0.1 - 1.3 K/UL       ABS. EOSINOPHILS  0.1  0.0 - 0.8 K/UL       ABS. BASOPHILS  0.0  0.0 - 0.2 K/UL       ABS. IMM. GRANS.  0.0  0.0 - 0.5 K/UL            DF  AUTOMATED          METABOLIC PANEL, COMPREHENSIVE          Collection Time: 08/27/20 10:36 AM         Result  Value  Ref Range            Sodium  137  136 - 145 mmol/L       Potassium  3.6  3.5 - 5.1 mmol/L       Chloride  104  98 - 107 mmol/L       CO2  28  21 - 32 mmol/L       Anion gap  5 (L)  7 - 16 mmol/L       Glucose  103 (H)  65 - 100 mg/dL       BUN  11  8 - 23 MG/DL       Creatinine  1.00  0.6 - 1.0 MG/DL       GFR est AA  >60  >60 ml/min/1.34m       GFR est non-AA  57 (L)  >60 ml/min/1.778m      Calcium  9.4  8.3 - 10.4 MG/DL       Bilirubin, total  0.7  0.2 - 1.1 MG/DL       ALT (SGPT)  12  12 - 65 U/L       AST (SGOT)  12 (L)  15 - 37 U/L       Alk. phosphatase  41 (L)  50 - 136 U/L        Protein, total  6.8  6.3 - 8.2 g/dL       Albumin  3.5  3.2 - 4.6 g/dL       Globulin  3.3  2.3 - 3.5 g/dL       A-G Ratio  1.1 (L)  1.2 - 3.5         MAGNESIUM          Collection Time: 08/27/20 10:36 AM         Result  Value  Ref Range            Magnesium  2.3  1.8 - 2.4 mg/dL           Imaging:   EXAM: PET/CT TUMOR IMAGE SKULL THIGH W (INI)   ??   INDICATION: highly suspicious for lung cancer.   ??   PROCEDURE: Following IV injection of 13.91 mCi 18 Fluoro 2 deoxyglucose (FDG)   and a standard uptake delay, PET imaging is performed from head to thighs and   axial, sagittal and coronal images were acquired. Unenhanced CT is obtained for  anatomic localization, and attenuation correction of the PET scan. Patient   preprocedure blood glucose level: 1:17m/dL.   ??   CORRELATIVE IMAGING STUDIES: CT chest dated 02/29/2020.   ??   COMPARISON PET: No prior PET/CT   ??   HISTORY: The study is requested for initial treatment strategy. .   ??   FINDINGS:   ??   HEAD/NECK:    Mildly hypermetabolic nodule within the superficial left parotid gland, favored   to represent a lymph node. No other hypermetabolic cervical lymphadenopathy.   Cerebral evaluation is limited by normal intense activity.   ??   CHEST:    Redemonstrated 6 cm pulmonary mass within the right lower lobe which   demonstrates peripheral nodular hypermetabolic activity (max SUV 8.9).   Additional nodular area inferiorly along the right posterior pleura which   demonstrates increased metabolic activity (max SUV 8.9). No hypermetabolic hilar   or mediastinal lymphadenopathy. Scattered calcified atherosclerotic disease   ??   ABDOMEN/PELVIS:    No foci of abnormal hypermetabolism. Suspected physiologic uptake within the   gastrointestinal and genitourinary tracts.   ??   SKELETON:    No foci of abnormal hypermetabolism in the axial and visualized appendicular   skeleton.   ??   IMPRESSION   1.  The 6 cm right lower lobe pulmonary mass demonstrates peripheral nodular    hypermetabolic activity, concerning for primary lung neoplasm with central   necrosis.   2.  No hypermetabolic lymphadenopathy or distant metastatic disease.   3.  Mildly enlarged and metabolic left parotid lymph node, likely reactive in   Etiology.         Pathology:   * * *DIAGNOSIS* * * * * * * * * * * * * * * * * * * * * * * * * * * * * * * ??     ???? ?? A: ??"CHEST WALL MASS CONTENTS":   ???? ?? NECROTIC DEBRIS  WITH OUT IDENTIFIABLE VIABLE MALIGNANT TUMOR   ???? ??   B: ??"NUMBER 8 LYMPH NODE X2":   ???? ?? ANTHRACOTIC LYMPH NODE NEGATIVE FOR METASTATIC TUMOR   ???? ??   C: ??"NUMBER 7 LYMPH NODE X4":   ????  ?? FOUR FRAGMENTS OF ANTHRACOTIC LYMPH NODE NEGATIVE FOR METASTATIC   TUMOR   ???? ??   D: ??"INTERLOBAR LYMPH NODE":   ???? ?? FOUR FRAGMENTS OF ANTHRACOTIC LYMPH NODE NEGATIVE FOR METASTATIC   TUMOR   ????  ??   E: ??"RIGHT LOWER LOBE OF LUNG":   ???? ?? POORLY DIFFERENTIATED MALIGNANT TUMOR CONSISTENT WITH POORLY   DIFFERENTIATED ADENOCARCINOMA MEASURING AP PROXIMALLY ??6.4 X 5.2 X 4.6 CM   WITH EXTENSIVE NECROSIS. BRONCHIAL  AND VASCULAR MARGINS OF RESECTION ARE   NEGATIVE FOR MALIGNANT TUMOR. FOUR ANTHRACOTIC PERIBRONCHIAL LYMPH NODES   NEGATIVE FOR METASTATIC TUMOR. SPECIMEN MARKED "CHEST WALL MASS" INVOLVED   BY POORLY DIFFERENTIATED MALIGNANT TUMOR SIMILAR  TO THAT WITHIN LUNG. SEE   COMMENT          * * *DIAGNOSIS* * * * * * * * * * * * * * * * * * * * * * * * * * * * * * * ??     ???? ?? "RIGHT LUNG MASS": ??IMMUNOHISTOCHEMICAL FINDINGS MOST CONSISTENT  WITH   ADENOCARCINOMA OF LUNG ORIGIN.           tls/04/17/2020     * * *Electronically Signed Out* * * ?? ?? ??  Sign Out Date: 04/18/2020 ??Charles A. Renella Cunas, MD           * * *PROCEDURES/ADDENDA*  * * * * * * * * * * * * * * * * * * * * ??* * * * ??     ???? ?? ?? ?? ?? ?? ?? ?? ??STF-IMMUNOHISTOCHEMISTRY     ???? ?? ?? ?? ?? ?? ?? ?? ?? ?? ?? ?? ?? ?? ?? ?? ?? ?? ??  ?? ?? ?? ?? ?? ?? ?? ?? ?? ?? ?? ?? ?? ?? ?? ?? ?? ??   ??Status: ??Reviewed By:   ??Charles A. Lorel Monaco, III, MD on 04/18/2020   ???? ??   ???? ?? ?? ?? ?? ?? ?? ?? ??  ?? ??  Interpretation   ???? ?? ?? ?? ?? ?? ?? ?? ?? ??Immunohistochemical Stain Panel:     ???? ?? Interpretation: ??Immunohistochemical findings most consistent with   adenocarcinoma of lung origin.      Antibody/Test ?? ?? ?? ?? ?? ?? ?? Marker For ?? ?? ?? ?? ?? ?? ?? ?? ?? ?? ?? ?? ?? ?? ??Result   CK 5/6 ?? ?? ?? ?? ?? ?? ?? ?? ?? ??Squamous cell carcinomas and mesotheliomas ??  ?? ??   ???? ??Negative   p40 ?? ?? ?? ?? ?? ?? ?? ?? ?? ??Squamous differentiation ?? ?? ?? ?? ?? ?? ?? ?? ?? ??   Negative   TTF-1 ?? ?? ?? ?? ?? ?? ?? ?? ?? ?? Lung  and thyroid adenocarcinoma ?? ?? ?? ?? ?? ?? ??   Positive   Napsin ?? ?? ?? ?? ?? ?? ?? ?? ?? ?? Lung adenocarcinoma ?? ?? ?? ?? ?? ?? ?? ?? ?? ?? ?? ??   Negative ??                ASSESSMENT:             ICD-10-CM  ICD-9-CM             1.  Bronchogenic cancer of right lung (HCC)   C34.91  162.9  CBC WITH AUTOMATED DIFF                METABOLIC PANEL, COMPREHENSIVE           Problem List   Date Reviewed:  2020-09-10                        Codes  Class  Noted             Adenocarcinoma of right lung, stage 2 ([place])  ICD-10-CM: C34.91   ICD-9-CM: 162.9    06/12/2020                       Side effect of medication  ICD-10-CM: T88.7XXA   ICD-9-CM: 995.20    06/12/2020                       Large cell carcinoma of lung, right ([place])  ICD-10-CM: C34.91   ICD-9-CM: 162.9    05/07/2020                       Bronchogenic cancer of right lung ([place])  ICD-10-CM: C34.91   ICD-9-CM: 162.9    04/28/2020  Right lower lobe lung mass  ICD-10-CM: R91.8   ICD-9-CM: 786.6    03/20/2020                       Dry skin dermatitis  ICD-10-CM: L85.3   ICD-9-CM: 692.89    01/03/2020                       Bilateral leg cramps  ICD-10-CM: R25.2   ICD-9-CM: 729.82    01/03/2020                       Elevated blood sugar  ICD-10-CM: R73.9   ICD-9-CM: 790.29    01/03/2020                       Hyperlipidemia  ICD-10-CM: E78.5   ICD-9-CM: 272.4    01/04/2019                       Chronic obstructive pulmonary disease ([place])  ICD-10-CM: J44.9   ICD-9-CM: 443    01/04/2019                       Pre-ulcerative  corn or callous  ICD-10-CM: L84   ICD-9-CM: 700    06/25/2018                       Chronic pain of left knee  ICD-10-CM: M25.562, G89.29   ICD-9-CM: 719.46, 338.29    01/26/2018          Overview Signed 02/29/2020 12:10 PM by Selinda Orion, NP            Last Assessment & Plan:    Formatting of this note might be different from the original.   Discussed in detail today.  We will see if we can get Visco therapy approved.  Referral to PT.                                   Prediabetes  ICD-10-CM: R73.03   ICD-9-CM: 790.29    06/10/2016                       Chronic right-sided low back pain without sciatica  ICD-10-CM: M54.50, G89.29   ICD-9-CM: 724.2, 338.29    02/12/2016                       Environmental allergies  ICD-10-CM: Z91.09   ICD-9-CM: V15.09    10/05/2014                       Rheumatoid arthritis involving multiple sites with positive rheumatoid factor (HCC)  ICD-10-CM: M05.79   ICD-9-CM: 714.0    01/30/2014          Overview Signed 02/29/2020 12:10 PM by Selinda Orion, NP            Last Assessment & Plan:    Formatting of this note might be different from the original.   Chronic/Stable. Denies any recent RA flares. Patient is taking MTX and folic acid. Denies any adverse side effects. Continue with current treatment plan. Reviewed previous labs. Ordered labs today. Will continue to monitor over time.      Instructed patient to  contact our office with any new or worsening symptoms.                                   Hypothyroidism  ICD-10-CM: E03.9   ICD-9-CM: 244.9    01/30/2014                       Vitamin D deficiency  ICD-10-CM: E55.9   ICD-9-CM: 268.9    01/30/2014          Overview Signed 02/29/2020 12:10 PM by Selinda Orion, NP            Last Assessment & Plan:    Formatting of this note might be different from the original.   Encouraged appropriate vitamin D supplementation. Monitor for signs or symptoms of hypercalcemia.                                   Osteoporosis  ICD-10-CM: M81.0    ICD-9-CM: 733.00    01/30/2014                       Gastroesophageal reflux disease without esophagitis  ICD-10-CM: K21.9   ICD-9-CM: 530.81    01/30/2014                       HTN (hypertension)  ICD-10-CM: I10   ICD-9-CM: 401.9    01/30/2014                             PLAN:   Lab studies were personally reviewed.      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  PET/CT shows no distant disease.  Clinically T3N0M0.  She completed VATS with left lower lobectomy as well as chest wall mass resection  and lymphadenectomy on 05/07/20.  She tolerated this reasonably well, still with some post-op tenderness but nothing unmanageable.  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  13 lymph node fragments were all negative for tumor.  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  We do also have the ALCHEMIST trial available, but unfortunately she is not a candidate due to her history of RA with MTX use.  She started Cis/Alimta on 07/11/2020.          She is here today for follow up and consideration of cycle 3 of Cis/Alimta. She tolerated last cycle better. Nausea improved with antiemetics. No mucositis. No GI complaints. Abdominal pain improving. She notes no other concerns. No infectious symptoms.  Labs reviewed and not acceptable for treatment. ANC 800. Hold treatment today and recheck next week. She may require does reduction. RTC in 1 week or sooner if needed. Continue supportive care as planned.  All questions were asked and answered to the  best of my ability.                    [place],  FNP-C    [facility]/[place] [address]   Office : [phone]   Fax : [phone]

## 2020-08-27 NOTE — Progress Notes (Signed)
Patient sen by Christena Flake, NP. Held chemotherapy today, due to [place] [address]. Possible chemotherapy dose reduction at next treatment in 1 week, but will address at next week's office visit. Patient denied any complaints. Return in 1 week for OV/labs. Encouraged to call with questions. Navigation will continue to follow.

## 2020-08-29 ENCOUNTER — Inpatient Hospital Stay: Primary: Internal Medicine

## 2020-09-04 ENCOUNTER — Ambulatory Visit: Attending: Family | Primary: Internal Medicine

## 2020-09-04 ENCOUNTER — Inpatient Hospital Stay: Admit: 2020-09-04 | Payer: MEDICARE | Primary: Internal Medicine

## 2020-09-04 ENCOUNTER — Ambulatory Visit: Admit: 2020-09-04 | Discharge: 2020-09-04 | Payer: MEDICARE | Attending: Family | Primary: Internal Medicine

## 2020-09-04 DIAGNOSIS — C3491 Malignant neoplasm of unspecified part of right bronchus or lung: Secondary | ICD-10-CM

## 2020-09-04 DIAGNOSIS — Z5111 Encounter for antineoplastic chemotherapy: Secondary | ICD-10-CM

## 2020-09-04 LAB — CBC WITH AUTO DIFFERENTIAL
Basophils %: 1 % (ref 0.0–2.0)
Basophils Absolute: 0.1 10*3/uL (ref 0.0–0.2)
Eosinophils %: 1 % (ref 0.5–7.8)
Eosinophils Absolute: 0.1 10*3/uL (ref 0.0–0.8)
Granulocyte Absolute Count: 0.1 10*3/uL (ref 0.0–0.5)
Hematocrit: 32.6 % — ABNORMAL LOW (ref 35.8–46.3)
Hemoglobin: 10.6 g/dL — ABNORMAL LOW (ref 11.7–15.4)
Immature Granulocytes: 1 % (ref 0.0–5.0)
Lymphocytes %: 20 % (ref 13–44)
Lymphocytes Absolute: 1.7 10*3/uL (ref 0.5–4.6)
MCH: 31.7 PG (ref 26.1–32.9)
MCHC: 32.5 g/dL (ref 31.4–35.0)
MCV: 97.6 FL (ref 79.6–97.8)
MPV: 9.8 FL (ref 9.4–12.3)
Monocytes %: 12 % (ref 4.0–12.0)
Monocytes Absolute: 1 10*3/uL (ref 0.1–1.3)
NRBC Absolute: 0 10*3/uL (ref 0.0–0.2)
Neutrophils %: 67 % (ref 43–78)
Neutrophils Absolute: 6 10*3/uL (ref 1.7–8.2)
Platelets: 261 10*3/uL (ref 150–450)
RBC: 3.34 M/uL — ABNORMAL LOW (ref 4.05–5.2)
RDW: 15.7 % — ABNORMAL HIGH (ref 11.9–14.6)
WBC: 8.8 10*3/uL (ref 4.3–11.1)

## 2020-09-04 LAB — COMPREHENSIVE METABOLIC PANEL
ALT: 16 U/L (ref 12–65)
AST: 19 U/L (ref 15–37)
Albumin/Globulin Ratio: 1.1 — ABNORMAL LOW (ref 1.2–3.5)
Albumin: 3.5 g/dL (ref 3.2–4.6)
Alkaline Phosphatase: 43 U/L — ABNORMAL LOW (ref 50–136)
Anion Gap: 5 mmol/L — ABNORMAL LOW (ref 7–16)
BUN: 16 MG/DL (ref 8–23)
CO2: 28 mmol/L (ref 21–32)
Calcium: 8.9 MG/DL (ref 8.3–10.4)
Chloride: 106 mmol/L (ref 98–107)
Creatinine: 0.9 MG/DL (ref 0.6–1.0)
EGFR IF NonAfrican American: >60 mL/min/{1.73_m2} (ref >60)
GFR African American: >60 mL/min/{1.73_m2} (ref >60)
Globulin: 3.2 g/dL (ref 2.3–3.5)
Glucose: 106 mg/dL — ABNORMAL HIGH (ref 65–100)
Potassium: 3.4 mmol/L — ABNORMAL LOW (ref 3.5–5.1)
Sodium: 139 mmol/L (ref 136–145)
Total Bilirubin: 0.6 MG/DL (ref 0.2–1.1)
Total Protein: 6.7 g/dL (ref 6.3–8.2)

## 2020-09-04 LAB — MAGNESIUM
Magnesium: 1.8 mg/dL (ref 1.8–2.4)
Magnesium: 1.8 mg/dL (ref 1.8–2.4)

## 2020-09-04 LAB — PHOSPHORUS
Phosphorus: 4 MG/DL — ABNORMAL HIGH (ref 2.3–3.7)
Phosphorus: 4 MG/DL — ABNORMAL HIGH (ref 2.3–3.7)

## 2020-09-04 LAB — CBC WITH AUTOMATED DIFF
ABS. BASOPHILS: 0.1 10*3/uL (ref 0.0–0.2)
ABS. EOSINOPHILS: 0.1 10*3/uL (ref 0.0–0.8)
ABS. IMM. GRANS.: 0.1 10*3/uL (ref 0.0–0.5)
ABS. LYMPHOCYTES: 1.7 10*3/uL (ref 0.5–4.6)
ABS. MONOCYTES: 1 10*3/uL (ref 0.1–1.3)
ABS. NEUTROPHILS: 6 10*3/uL (ref 1.7–8.2)
ABSOLUTE NRBC: 0 10*3/uL (ref 0.0–0.2)
BASOPHILS: 1 % (ref 0.0–2.0)
EOSINOPHILS: 1 % (ref 0.5–7.8)
HCT: 32.6 % — ABNORMAL LOW (ref 35.8–46.3)
HGB: 10.6 g/dL — ABNORMAL LOW (ref 11.7–15.4)
IMMATURE GRANULOCYTES: 1 % (ref 0.0–5.0)
LYMPHOCYTES: 20 % (ref 13–44)
MCH: 31.7 PG (ref 26.1–32.9)
MCHC: 32.5 g/dL (ref 31.4–35.0)
MCV: 97.6 FL (ref 79.6–97.8)
MONOCYTES: 12 % (ref 4.0–12.0)
MPV: 9.8 FL (ref 9.4–12.3)
NEUTROPHILS: 67 % (ref 43–78)
PLATELET: 261 10*3/uL (ref 150–450)
RBC: 3.34 M/uL — ABNORMAL LOW (ref 4.05–5.2)
RDW: 15.7 % — ABNORMAL HIGH (ref 11.9–14.6)
WBC: 8.8 10*3/uL (ref 4.3–11.1)

## 2020-09-04 LAB — METABOLIC PANEL, COMPREHENSIVE
A-G Ratio: 1.1 — ABNORMAL LOW (ref 1.2–3.5)
ALT (SGPT): 16 U/L (ref 12–65)
AST (SGOT): 19 U/L (ref 15–37)
Albumin: 3.5 g/dL (ref 3.2–4.6)
Alk. phosphatase: 43 U/L — ABNORMAL LOW (ref 50–136)
Anion gap: 5 mmol/L — ABNORMAL LOW (ref 7–16)
BUN: 16 MG/DL (ref 8–23)
Bilirubin, total: 0.6 MG/DL (ref 0.2–1.1)
CO2: 28 mmol/L (ref 21–32)
Calcium: 8.9 MG/DL (ref 8.3–10.4)
Chloride: 106 mmol/L (ref 98–107)
Creatinine: 0.9 MG/DL (ref 0.6–1.0)
GFR est AA: >60 mL/min/{1.73_m2} (ref >60)
GFR est non-AA: >60 mL/min/{1.73_m2} (ref >60)
Globulin: 3.2 g/dL (ref 2.3–3.5)
Glucose: 106 mg/dL — ABNORMAL HIGH (ref 65–100)
Potassium: 3.4 mmol/L — ABNORMAL LOW (ref 3.5–5.1)
Protein, total: 6.7 g/dL (ref 6.3–8.2)
Sodium: 139 mmol/L (ref 136–145)

## 2020-09-04 MED ORDER — SODIUM CHLORIDE 0.9 % IJ SYRG
INTRAMUSCULAR | Status: AC | PRN
Start: 2020-09-04 — End: 2020-09-04
  Administered 2020-09-04 (×2): via INTRAVENOUS

## 2020-09-04 MED ORDER — ACETAMINOPHEN 325 MG TABLET
325 mg | Freq: Once | ORAL | Status: AC
Start: 2020-09-04 — End: 2020-09-04
  Administered 2020-09-04: 19:00:00 via ORAL

## 2020-09-04 MED ORDER — POTASSIUM CHLORIDE SR 10 MEQ TAB, PARTICLES/CRYSTALS
10 mEq | ORAL | Status: AC
Start: 2020-09-04 — End: 2020-09-04
  Administered 2020-09-04: 15:00:00 via ORAL

## 2020-09-04 MED ORDER — FOSAPREPITANT 150 MG IV SOLUTION
150 mg | Freq: Once | INTRAVENOUS | Status: AC
Start: 2020-09-04 — End: 2020-09-04
  Administered 2020-09-04: 17:00:00 via INTRAVENOUS

## 2020-09-04 MED ORDER — CYANOCOBALAMIN 1,000 MCG/ML IJ SOLN
1000 mcg/mL | Freq: Once | INTRAMUSCULAR | Status: AC
Start: 2020-09-04 — End: 2020-09-04
  Administered 2020-09-04: 17:00:00 via INTRAMUSCULAR

## 2020-09-04 MED ORDER — PEMETREXED 500 MG IV SOLUTION
500 mg | Freq: Once | INTRAVENOUS | Status: AC
Start: 2020-09-04 — End: 2020-09-04
  Administered 2020-09-04: 18:00:00 via INTRAVENOUS

## 2020-09-04 MED ORDER — SODIUM CHLORIDE 0.9 % IV
INTRAVENOUS | Status: AC
Start: 2020-09-04 — End: 2020-09-04
  Administered 2020-09-04: 17:00:00 via INTRAVENOUS

## 2020-09-04 MED ORDER — SODIUM CHLORIDE 0.9 % IV
450 mEq/mL (50 %) | Freq: Once | INTRAVENOUS | Status: AC
Start: 2020-09-04 — End: 2020-09-04
  Administered 2020-09-04: 16:00:00 via INTRAVENOUS

## 2020-09-04 MED ORDER — ONDANSETRON (PF) 4 MG/2 ML INJECTION
4 mg/2 mL | Freq: Once | INTRAMUSCULAR | Status: AC
Start: 2020-09-04 — End: 2020-09-04
  Administered 2020-09-04: 17:00:00 via INTRAVENOUS

## 2020-09-04 MED ORDER — DEXAMETHASONE SODIUM PHOSPHATE 4 MG/ML IJ SOLN
4 mg/mL | Freq: Once | INTRAMUSCULAR | Status: AC
Start: 2020-09-04 — End: 2020-09-04
  Administered 2020-09-04: 17:00:00 via INTRAVENOUS

## 2020-09-04 MED ORDER — SODIUM CHLORIDE 0.9 % IV
1 mg/mL | Freq: Once | INTRAVENOUS | Status: AC
Start: 2020-09-04 — End: 2020-09-04
  Administered 2020-09-04: 18:00:00 via INTRAVENOUS

## 2020-09-04 MED ORDER — SODIUM CHLORIDE 0.9 % IV
450 mEq/mL (50 %) | Freq: Once | INTRAVENOUS | Status: AC
Start: 2020-09-04 — End: 2020-09-04
  Administered 2020-09-04: 20:00:00 via INTRAVENOUS

## 2020-09-04 MED FILL — CISPLATIN 1 MG/ML IV SOLN: 1 mg/mL | INTRAVENOUS | Qty: 139.5

## 2020-09-04 MED FILL — DEXAMETHASONE SODIUM PHOSPHATE 4 MG/ML IJ SOLN: 4 mg/mL | INTRAMUSCULAR | Qty: 3

## 2020-09-04 MED FILL — SODIUM CHLORIDE 0.9 % IV: INTRAVENOUS | Qty: 1000

## 2020-09-04 MED FILL — ACETAMINOPHEN 325 MG TABLET: 325 mg | ORAL | Qty: 2

## 2020-09-04 MED FILL — CYANOCOBALAMIN 1,000 MCG/ML IJ SOLN: 1000 mcg/mL | INTRAMUSCULAR | Qty: 1

## 2020-09-04 MED FILL — POTASSIUM CHLORIDE SR 10 MEQ TAB, PARTICLES/CRYSTALS: 10 mEq | ORAL | Qty: 4

## 2020-09-04 MED FILL — FOSAPREPITANT 150 MG IV SOLUTION: 150 mg | INTRAVENOUS | Qty: 5

## 2020-09-04 MED FILL — ONDANSETRON (PF) 4 MG/2 ML INJECTION: 4 mg/2 mL | INTRAMUSCULAR | Qty: 4

## 2020-09-04 MED FILL — ALIMTA 500 MG INTRAVENOUS SOLUTION: 500 mg | INTRAVENOUS | Qty: 20

## 2020-09-04 NOTE — Progress Notes (Signed)
09/04/2020 - Pt seen by Ileene Musa, NP f/u pre-chemo visit; c3d1 chemo and Vitamin B12 injection scheduled for today. Denies any pain, nausea, fevers. Labs pending. Next OV/labs/chemo scheduled for 09/24/2020. Encouraged to call with questions. Navigation will continue to follow.

## 2020-09-04 NOTE — Progress Notes (Signed)
Patient arrived to [place] for B12, [place], and Cisplatin. Assessment completed. No needs voiced at this time. Patient tolerated infusion well and is aware of next appointment on 09/24/2020 @0730 .  Patient discharged ambulatory.

## 2020-09-04 NOTE — Progress Notes (Signed)
Patient asking for Tylenol for headache not related to chemo infusion. Contacted Katie Beth Roux, NP and orders received for Tylenol 650mg .

## 2020-09-04 NOTE — Progress Notes (Signed)
Progress Notes by Charna Elizabeth, NP at 09/04/20 0800                Author: Charna Elizabeth, NP  Service: --  Author Type: Nurse Practitioner       Filed: 09/04/20 1203  Encounter Date: 09/04/2020  Status: Signed          Editor: Charna Elizabeth, NP (Nurse Practitioner)               [facility] Hematology and Oncology: Office Visit Established Patient      Chief Complaint:       Chief Complaint       Patient presents with        ?  Follow-up              History of Present Illness:   Ms. Blacksher is a  80 y.o. female who presents today for follow-up regarding resected adenocarcinoma of the  lung.  On 02/27/20, Ms. Cutter presented to [facility] ED with complaints of achiness in chest with radiation of pain to her left upper extremity with association of N/V, SOB and diaphoresis. Cardiac work-up was negative; however her chest x-ray reported a 5.1  cm rounded opacity at the right lung base.  She saw her PCP on 02/29/20, CT scan was ordered and reported a 5 cm mass with adjacent pleural nodularity posteriorly at the right lung base and considered suspicious for bronchogenic carcinoma. On 04/02/20 she  had a PET/CT scan that reported a 6 cm right lower lobe pulmonary mass with no metastatic disease.  Biopsy showed adenocarcinoma of lung origin.  We saw her prior to surgical evaluation and recommended proceeding with lobectomy and lymphadenectomy if  she was a surgical candidate.  She completed VATS with left lower lobectomy as well as chest wall mass resection and lymphadenectomy on 05/07/20.  Her tumor was 6.4 cm in greatest dimension with carcinoma in the specimen marked "chest wall" but with separation  from tumor by connective tissue and lung parenchyma.  13 lymph node fragments were all negative for tumor.  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  Because of the T3N0 disease, we would consider adjuvant  chemotherapy, likely cisplatin and pemetrexed for 4 cycles.  We  do also have the ALCHEMIST trial available, but unfortunately she is not a candidate due to her history of RA with MTX use.       She returns today for follow up and consideration of cycle 3 of Cis/Alimta after a week delay due to neutropenia.  She has been over the past week.  There are no GI or bowel complaints. No mucositis.  She is eating and drinking well.  Energy level is  OK.  There is exertional shortness of breath, no cough or edema.  No neuropathy.  No changes in baseline hearing loss.  She reports occasional abdominal pain that lasts a few minutes.  She also reports ongoing right breast pain, no masses or lumps.  Mammogram  was benign in August 2021-she reports pain is intermittent and was previous to mammo.  No fevers or infectious symptoms.          Review of Systems:   Constitutional: Negative.    HENT: Negative.    Eyes: Negative.    Respiratory: Negative.    Cardiovascular: Negative.    Gastrointestinal: Negative.    Genitourinary: Negative.    Musculoskeletal: Negative.  Skin: Negative.    Neurological: Negative.    Endo/Heme/Allergies: Negative.    Psychiatric/Behavioral: Negative.    All other systems reviewed and are negative.            Allergies        Allergen  Reactions         ?  Yeast  Nausea and Vomiting             Pt. Says she hasnt had issues with this in a long time          Past Medical History:        Diagnosis  Date         ?  Arrhythmia  04/05/2020          PVCs.  04/05/20 was referred to [facility] for tachycardia, was seen 05/01/20 [facility]         ?  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  Autoimmune disease ([place])            RA         ?  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  Chronic obstructive pulmonary disease (HCC)            uses inhalers         ?  Chronic pain of left knee  06/25/2018     ?  Coronary atherosclerosis            noted  on chest CT         ?  COVID-19 vaccine series completed  08/15/2019          Pfizer; 07/29/2019 and booster given 03/12/2020         ?  GERD (gastroesophageal reflux disease)            no meds at this time         ?  H/O cardiovascular stress test            Bruce protocol, low risk study, EF of 67% and normal LV function         ?  H/O mammogram  07/28/2016          mammogram and Ultrasound of right breaset- benign finding- [facility]          ?  Hearing loss            "only hears out of left ear"         ?  Hearing loss of both ears            no hearing aids          ?  HTN (hypertension), benign  01/30/2014          controlled w/med         ?  Hypothyroid            on med for control         ?  Menopause       ?  Osteoarthritis       ?  Osteoporosis            prolia          ?  Poor historian  patient had trouble remembering names of meds and what they were for and had to repeat instructions for surgery several times          ?  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 Debbra Riding. Methotrexate Rx         ?  Sinusitis            sinus flonase          ?  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 Nancie Neas         ?  Unspecified hypothyroidism  01/30/2014          thyroid medication         ?  Vitamin D deficiency            medications for this           Past Surgical History:         Procedure  Laterality  Date          ?  COLONOSCOPY  N/A  01/27/2018          COLONOSCOPY/BMI 26 performed by Azalia Bilis, MD at [place]          ?  HX CARPAL TUNNEL RELEASE  Bilateral       ?  HX COLONOSCOPY              colon polyps-adenomatous, has seen Dr. Kristopher Glee in past          ?  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  HX HEMORRHOIDECTOMY         ?  HX OTHER SURGICAL    09/2012          sinus surgery-Dr. Mickle Mallory          ?  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  HX TONSILLECTOMY    80 yrs old     ?  PR  COLONOSCOPY FLX DX W/COLLJ SPEC WHEN PFRMD    01/27/2018                     Family History         Problem  Relation  Age of Onset          ?  OSTEOARTHRITIS  Mother       ?  Alzheimer's Disease  Father            ?  Breast Cancer  Neg Hx            Social History          Socioeconomic History         ?  Marital status:  MARRIED              Spouse name:  Not on file         ?  Number of children:  Not on file     ?  Years of education:  Not on file     ?  Highest education level:  Not on file       Occupational History        ?  Not on file       Tobacco Use         ?  Smoking status:  Former Smoker              Packs/day:  0.50         Years:  43.00         Pack years:  21.50         Types:  Cigarettes         Start date:  1961         Quit date:  1985         Years since quitting:  37.1         ?  Smokeless tobacco:  Never Used        ?  Tobacco comment: not regular when first started- 1pk/week       Vaping Use         ?  Vaping Use:  Never used       Substance and Sexual Activity         ?  Alcohol use:  No              Alcohol/week:  0.0 standard drinks         ?  Drug use:  No     ?  Sexual activity:  Not on file        Other Topics  Concern        ?  Not on file       Social History Narrative          Married and lives with husband.  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          Social Determinants of Health          Financial Resource Strain:         ?  Difficulty of Paying Living Expenses: Not on file       Food Insecurity:         ?  Worried About Running Out of Food in the Last Year: Not on file     ?  Ran Out of Food in the Last Year: Not on file       Transportation Needs:         ?  Lack of Transportation (Medical): Not on file     ?  Lack of Transportation (Non-Medical): Not on file       Physical Activity:         ?  Days of Exercise per Week: Not on file     ?  Minutes of Exercise per Session: Not on file       Stress:         ?  Feeling of Stress : Not on file       Social Connections:         ?   Frequency of Communication with Friends and Family: Not on file     ?  Frequency of Social Gatherings with Friends and Family: Not on file     ?  Attends Religious Services: Not on file     ?  Active Member of Clubs or Organizations: Not on file     ?  Attends Archivist Meetings: Not on file     ?  Marital Status: Not on file       Intimate Partner Violence:         ?  Fear of Current or Ex-Partner: Not on file     ?  Emotionally Abused: Not on file     ?  Physically Abused: Not on file     ?  Sexually Abused: Not on file       Housing Stability:         ?  Unable to Pay for Housing in the Last Year: Not on file     ?  Number of Places Lived in the Last Year: Not on file        ?  Unstable Housing in the Last Year: Not on file          Current Outpatient Medications          Medication  Sig  Dispense  Refill           ?  losartan (COZAAR) 100 mg tablet  TAKE 1 TABLET BY MOUTH DAILY  90 Tablet  3     ?  ondansetron hcl (ZOFRAN) 8 mg tablet  Take 1 Tablet by mouth every eight (8) hours as needed for Nausea or Vomiting.  90 Tablet  1     ?  OTHER,NON-FORMULARY,  daily. Prevagen         ?  folic acid (FOLVITE) 1 mg tablet  Take 1 Tablet by mouth daily.  90 Tablet  3     ?  lidocaine-prilocaine (EMLA) topical cream  Apply  to affected area as needed for Pain.  30 g  0     ?  atorvastatin (LIPITOR) 20 mg tablet  Take 20 mg by mouth daily.         ?  ergocalciferol (ERGOCALCIFEROL) 1,250 mcg (50,000 unit) capsule  Take 50,000 Units by mouth every fourteen (14) days.         ?  levothyroxine (SYNTHROID) 100 mcg tablet  Take 1 Tablet by mouth Daily (before breakfast). (Patient taking differently: Take 125 mcg by mouth Daily (before breakfast).)  30 Tablet  11     ?  albuterol (PROVENTIL HFA, VENTOLIN HFA, PROAIR HFA) 90 mcg/actuation inhaler  Take 1 Puff by inhalation every six (6) hours as needed for Wheezing.  1 Inhaler  5     ?  fluticasone propionate (FLONASE) 50 mcg/actuation nasal spray  2 Sprays by Both  Nostrils route daily. (Patient taking differently: 2 Sprays by Both Nostrils route daily as needed.)  1 Bottle  5     ?  denosumab (PROLIA) 60 mg/mL injection  60 mg by SubCUTAneous route every 6 months. Due December 2021         ?  methotrexate, PF, 25 mg/mL injection  25 mg every seven (7) days. Friday evening         ?  multivitamin (ONE A DAY) tablet  Take 1 Tab by mouth daily.         ?  prochlorperazine (Compazine) 10 mg tablet  Take 1 Tablet by mouth every six (6) hours as needed for Nausea or Vomiting.  90 Tablet  1     ?  magnesium 250 mg tab  Take  by mouth daily. (Patient not taking: Reported on 08/27/2020)         ?  umeclidinium-vilanteroL (Anoro Ellipta) 62.5-25 mcg/actuation inhaler  Take 1 Puff by inhalation daily. (Patient not taking: Reported on 09/04/2020)  1 Each  11     ?  metoprolol succinate (TOPROL-XL) 50 mg XL tablet  Take 50 mg by mouth daily. (Patient  not taking: Reported on 08/27/2020)         ?  mupirocin (BACTROBAN) 2 % ointment  Apply  to affected area daily. (Patient not taking: Reported on 07/31/2020)  15 g  1     ?  gabapentin (NEURONTIN) 100 mg capsule  Take 100 mg by mouth three (3) times daily as needed. (Patient not taking: Reported on 08/27/2020)               ?  nicotinic acid (NIACIN) 500 mg tablet  Take 500 mg by mouth Daily (before breakfast). 2 po everyday.  (Patient not taking: Reported on 08/27/2020)              Facility-Administered Medications Ordered in Other Visits             Medication  Dose  Route  Frequency  Provider  Last Rate  Last Admin              ?  0.9% sodium chloride infusion   25 mL/hr  IntraVENous  CONTINUOUS  Charna Elizabeth, NP           ?  fosaprepitant (EMEND) 150 mg in 0.9% sodium chloride 150 mL IVPB   150 mg  IntraVENous  ONCE  Charna Elizabeth, NP  450 mL/hr at 09/04/20 1201  150 mg at 09/04/20 1201     ?  PEMEtrexed (ALIMTA) 900 mg in 0.9% sodium chloride 100 mL chemo infusion   900 mg  IntraVENous  ONCE  Charna Elizabeth, NP                    ?   CISplatin (PLATINOL) 139.5 mg in 0.9% sodium chloride 500 mL chemo infusion   75 mg/m2 (Treatment Plan Recorded)  IntraVENous  ONCE  Charna Elizabeth, NP                    ?  0.9% sodium chloride 1,000 mL with potassium chloride 10 mEq, magnesium sulfate 2 g infusion     IntraVENous  ONCE  Charna Elizabeth, NP                    ?  sodium chloride (NS) flush 10 mL   10 mL  IntraVENous  PRN  Charna Elizabeth, NP     10 mL at 09/04/20 0923           OBJECTIVE:   Visit Vitals       BP  (!) 144/76  Comment: standing        Pulse  61     Temp  97.8 ??F (36.6 ??C) (Oral)     Resp  22     Wt  154 lb (69.9 kg)     SpO2  95%        BMI  22.10 kg/m??           Physical Exam:      Constitutional:  Well developed, well nourished female in no acute  distress, sitting comfortably on the examination table.         HEENT:  Normocephalic and atraumatic. Sclerae anicteric. Neck supple without JVD. No thyromegaly present.      Lymph node     Deferred.       Skin  Warm and dry.  No bruising and no rash noted.  No erythema.  No pallor.      Respiratory  Lungs are clear to  auscultation bilaterally without wheezes, rales or rhonchi, normal air exchange without accessory muscle use.      CVS  Normal rate, regular rhythm and normal S1 and S2.  No murmurs, gallops, or rubs.     Abdomen  Soft, nontender and nondistended, normoactive bowel sounds.  No palpable mass.       Neuro  Grossly nonfocal with no obvious sensory or motor deficits.     MSK  Normal range of motion in general.  No edema and no tenderness.        Psych  Appropriate mood and affect.         Labs:     Recent Results (from the past 24 hour(s))     CBC WITH AUTOMATED DIFF          Collection Time: 09/04/20  9:34 AM         Result  Value  Ref Range            WBC  8.8  4.3 - 11.1 K/uL       RBC  3.34 (L)  4.05 - 5.2 M/uL       HGB  10.6 (L)  11.7 - 15.4 g/dL       HCT  32.6 (L)  35.8 - 46.3 %            MCV  97.6  79.6 - 97.8 FL            MCH  31.7  26.1 - 32.9 PG       MCHC   32.5  31.4 - 35.0 g/dL       RDW  15.7 (H)  11.9 - 14.6 %       PLATELET  261  150 - 450 K/uL       MPV  9.8  9.4 - 12.3 FL       ABSOLUTE NRBC  0.00  0.0 - 0.2 K/uL       DF  AUTOMATED          NEUTROPHILS  67  43 - 78 %       LYMPHOCYTES  20  13 - 44 %       MONOCYTES  12  4.0 - 12.0 %       EOSINOPHILS  1  0.5 - 7.8 %       BASOPHILS  1  0.0 - 2.0 %       IMMATURE GRANULOCYTES  1  0.0 - 5.0 %       ABS. NEUTROPHILS  6.0  1.7 - 8.2 K/UL       ABS. LYMPHOCYTES  1.7  0.5 - 4.6 K/UL       ABS. MONOCYTES  1.0  0.1 - 1.3 K/UL       ABS. EOSINOPHILS  0.1  0.0 - 0.8 K/UL       ABS. BASOPHILS  0.1  0.0 - 0.2 K/UL       ABS. IMM. GRANS.  0.1  0.0 - 0.5 K/UL       METABOLIC PANEL, COMPREHENSIVE          Collection Time: 09/04/20  9:34 AM         Result  Value  Ref Range            Sodium  139  136 - 145 mmol/L       Potassium  3.4 (L)  3.5 - 5.1 mmol/L       Chloride  106  98 - 107 mmol/L       CO2  28  21 - 32 mmol/L       Anion gap  5 (L)  7 - 16 mmol/L       Glucose  106 (H)  65 - 100 mg/dL       BUN  16  8 - 23 MG/DL       Creatinine  0.90  0.6 - 1.0 MG/DL       GFR est AA  >60  >60 ml/min/1.77m            GFR est non-AA  >60  >60 ml/min/1.733m           Calcium  8.9  8.3 - 10.4 MG/DL       Bilirubin, total  0.6  0.2 - 1.1 MG/DL       ALT (SGPT)  16  12 - 65 U/L       AST (SGOT)  19  15 - 37 U/L       Alk. phosphatase  43 (L)  50 - 136 U/L       Protein, total  6.7  6.3 - 8.2 g/dL       Albumin  3.5  3.2 - 4.6 g/dL       Globulin  3.2  2.3 - 3.5 g/dL       A-G Ratio  1.1 (L)  1.2 - 3.5         MAGNESIUM          Collection Time: 09/04/20  9:34 AM         Result  Value  Ref Range            Magnesium  1.8  1.8 - 2.4 mg/dL       PHOSPHORUS          Collection Time: 09/04/20  9:34 AM         Result  Value  Ref Range            Phosphorus  4.0 (H)  2.3 - 3.7 MG/DL           Imaging:   EXAM: PET/CT TUMOR IMAGE SKULL THIGH W (INI)   ??   INDICATION: highly suspicious for lung cancer.   ??   PROCEDURE: Following IV injection  of 13.91 mCi 18 Fluoro 2 deoxyglucose (FDG)   and a standard uptake delay, PET imaging is performed from head to thighs and   axial, sagittal and coronal images were acquired. Unenhanced CT is obtained for   anatomic localization, and attenuation correction of the PET scan. Patient   preprocedure blood glucose level: 1:1524mL.   ??   CORRELATIVE IMAGING STUDIES: CT chest dated 02/29/2020.   ??   COMPARISON PET: No prior PET/CT   ??   HISTORY: The study is requested for initial treatment strategy. .   ??   FINDINGS:   ??   HEAD/NECK:    Mildly hypermetabolic nodule within the superficial left parotid gland, favored   to represent a lymph node. No other hypermetabolic cervical lymphadenopathy.   Cerebral evaluation is limited by normal intense activity.   ??   CHEST:    Redemonstrated 6 cm pulmonary mass within the right lower lobe which   demonstrates peripheral nodular hypermetabolic activity (max SUV 8.9).   Additional nodular area inferiorly along the right posterior pleura which   demonstrates increased metabolic activity (max SUV 8.9). No hypermetabolic hilar  or mediastinal lymphadenopathy. Scattered calcified atherosclerotic disease   ??   ABDOMEN/PELVIS:    No foci of abnormal hypermetabolism. Suspected physiologic uptake within the   gastrointestinal and genitourinary tracts.   ??   SKELETON:    No foci of abnormal hypermetabolism in the axial and visualized appendicular   skeleton.   ??   IMPRESSION   1.  The 6 cm right lower lobe pulmonary mass demonstrates peripheral nodular   hypermetabolic activity, concerning for primary lung neoplasm with central   necrosis.   2.  No hypermetabolic lymphadenopathy or distant metastatic disease.   3.  Mildly enlarged and metabolic left parotid lymph node, likely reactive in   Etiology.         Pathology:   * * *DIAGNOSIS* * * * * * * * * * * * * * * * * * * * * * * * * * * * * * * ??     ???? ?? A: ??"CHEST WALL MASS CONTENTS":   ???? ?? NECROTIC DEBRIS  WITH OUT IDENTIFIABLE VIABLE  MALIGNANT TUMOR   ???? ??   B: ??"NUMBER 8 LYMPH NODE X2":   ???? ?? ANTHRACOTIC LYMPH NODE NEGATIVE FOR METASTATIC TUMOR   ???? ??   C: ??"NUMBER 7 LYMPH NODE X4":   ????  ?? FOUR FRAGMENTS OF ANTHRACOTIC LYMPH NODE NEGATIVE FOR METASTATIC   TUMOR   ???? ??   D: ??"INTERLOBAR LYMPH NODE":   ???? ?? FOUR FRAGMENTS OF ANTHRACOTIC LYMPH NODE NEGATIVE FOR METASTATIC   TUMOR   ????  ??   E: ??"RIGHT LOWER LOBE OF LUNG":   ???? ?? POORLY DIFFERENTIATED MALIGNANT TUMOR CONSISTENT WITH POORLY   DIFFERENTIATED ADENOCARCINOMA MEASURING AP PROXIMALLY ??6.4 X 5.2 X 4.6 CM   WITH EXTENSIVE NECROSIS. BRONCHIAL  AND VASCULAR MARGINS OF RESECTION ARE   NEGATIVE FOR MALIGNANT TUMOR. FOUR ANTHRACOTIC PERIBRONCHIAL LYMPH NODES   NEGATIVE FOR METASTATIC TUMOR. SPECIMEN MARKED "CHEST WALL MASS" INVOLVED   BY POORLY DIFFERENTIATED MALIGNANT TUMOR SIMILAR  TO THAT WITHIN LUNG. SEE   COMMENT          * * *DIAGNOSIS* * * * * * * * * * * * * * * * * * * * * * * * * * * * * * * ??     ???? ?? "RIGHT LUNG MASS": ??IMMUNOHISTOCHEMICAL FINDINGS MOST CONSISTENT  WITH   ADENOCARCINOMA OF LUNG ORIGIN.           tls/04/17/2020     * * *Electronically Signed Out* * * ?? ?? ??   Sign Out Date: 04/18/2020 ??Charles A. Renella Cunas, MD           * * *PROCEDURES/ADDENDA*  * * * * * * * * * * * * * * * * * * * * ??* * * * ??     ???? ?? ?? ?? ?? ?? ?? ?? ??STF-IMMUNOHISTOCHEMISTRY     ???? ?? ?? ?? ?? ?? ?? ?? ?? ?? ?? ?? ?? ?? ?? ?? ?? ?? ??  ?? ?? ?? ?? ?? ?? ?? ?? ?? ?? ?? ?? ?? ?? ?? ?? ?? ??   ??Status: ??Reviewed By:   ??Charles A. Lorel Monaco, III, MD on 04/18/2020   ???? ??   ???? ?? ?? ?? ?? ?? ?? ?? ??  ?? ?? Interpretation   ???? ?? ?? ?? ?? ?? ?? ?? ?? ??Immunohistochemical Stain Panel:     ???? ??  Interpretation: ??Immunohistochemical findings most consistent with   adenocarcinoma of lung origin.      Antibody/Test ?? ?? ?? ?? ?? ?? ?? Marker For ?? ?? ?? ?? ?? ?? ?? ?? ?? ?? ?? ?? ?? ?? ??Result   CK 5/6 ?? ?? ?? ?? ?? ?? ?? ?? ?? ??Squamous cell carcinomas and mesotheliomas ??  ?? ??   ???? ??Negative   p40 ?? ?? ?? ?? ?? ?? ?? ?? ?? ??Squamous differentiation ?? ?? ?? ?? ?? ?? ?? ?? ?? ??   Negative   TTF-1 ?? ?? ??  ?? ?? ?? ?? ?? ?? ?? Lung  and thyroid adenocarcinoma ?? ?? ?? ?? ?? ?? ??   Positive   Napsin ?? ?? ?? ?? ?? ?? ?? ?? ?? ?? Lung adenocarcinoma ?? ?? ?? ?? ?? ?? ?? ?? ?? ?? ?? ??   Negative ??                ASSESSMENT:             ICD-10-CM  ICD-9-CM             1.  Large cell carcinoma of lung, right (HCC)   C34.91  162.9  CBC WITH AUTOMATED DIFF                METABOLIC PANEL, COMPREHENSIVE           MAGNESIUM           2.  Fatigue due to treatment   R53.83  780.79             Problem List   Date Reviewed:  09-21-20                        Codes  Class  Noted             Adenocarcinoma of right lung, stage 2 ([place])  ICD-10-CM: C34.91   ICD-9-CM: 162.9    06/12/2020                       Side effect of medication  ICD-10-CM: T88.7XXA   ICD-9-CM: 995.20    06/12/2020                       Large cell carcinoma of lung, right ([place])  ICD-10-CM: C34.91   ICD-9-CM: 162.9    05/07/2020                       Bronchogenic cancer of right lung ([place])  ICD-10-CM: C34.91   ICD-9-CM: 162.9    04/28/2020                       Right lower lobe lung mass  ICD-10-CM: R91.8   ICD-9-CM: 786.6    03/20/2020                       Dry skin dermatitis  ICD-10-CM: L85.3   ICD-9-CM: 692.89    01/03/2020                       Bilateral leg cramps  ICD-10-CM: R25.2   ICD-9-CM: 729.82    01/03/2020                       Elevated blood sugar  ICD-10-CM: R73.9  ICD-9-CM: 790.29    01/03/2020                       Hyperlipidemia  ICD-10-CM: E78.5   ICD-9-CM: 272.4    01/04/2019                       Chronic obstructive pulmonary disease ([place])  ICD-10-CM: J44.9   ICD-9-CM: 865    01/04/2019                       Pre-ulcerative corn or callous  ICD-10-CM: L84   ICD-9-CM: 700    06/25/2018                       Chronic pain of left knee  ICD-10-CM: M25.562, G89.29   ICD-9-CM: 719.46, 338.29    01/26/2018          Overview Signed 02/29/2020 12:10 PM by Selinda Orion, NP            Last Assessment & Plan:    Formatting of this note might be different from the original.   Discussed in detail  today.  We will see if we can get Visco therapy approved.  Referral to PT.                                   Prediabetes  ICD-10-CM: R73.03   ICD-9-CM: 790.29    06/10/2016                       Chronic right-sided low back pain without sciatica  ICD-10-CM: M54.50, G89.29   ICD-9-CM: 724.2, 338.29    02/12/2016                       Environmental allergies  ICD-10-CM: Z91.09   ICD-9-CM: V15.09    10/05/2014                       Rheumatoid arthritis involving multiple sites with positive rheumatoid factor (HCC)  ICD-10-CM: M05.79   ICD-9-CM: 714.0    01/30/2014          Overview Signed 02/29/2020 12:10 PM by Selinda Orion, NP            Last Assessment & Plan:    Formatting of this note might be different from the original.   Chronic/Stable. Denies any recent RA flares. Patient is taking MTX and folic acid. Denies any adverse side effects. Continue with current treatment plan. Reviewed previous labs. Ordered labs today. Will continue to monitor over time.      Instructed patient to contact our office with any new or worsening symptoms.                                   Hypothyroidism  ICD-10-CM: E03.9   ICD-9-CM: 244.9    01/30/2014                       Vitamin D deficiency  ICD-10-CM: E55.9   ICD-9-CM: 268.9    01/30/2014          Overview Signed 02/29/2020 12:10 PM by Selinda Orion, NP  Last Assessment & Plan:    Formatting of this note might be different from the original.   Encouraged appropriate vitamin D supplementation. Monitor for signs or symptoms of hypercalcemia.                                   Osteoporosis  ICD-10-CM: M81.0   ICD-9-CM: 733.00    01/30/2014                       Gastroesophageal reflux disease without esophagitis  ICD-10-CM: K21.9   ICD-9-CM: 530.81    01/30/2014                       HTN (hypertension)  ICD-10-CM: I10   ICD-9-CM: 401.9    01/30/2014                             PLAN:   Lab studies were personally reviewed.      Lung cancer: adenocarcinoma, 6 cm in right lower lobe  adjacent to diaphragm with possible focal pleural involvement.  PET/CT shows no distant disease.  Clinically T3N0M0.  She completed VATS with left lower lobectomy as well as chest wall mass resection  and lymphadenectomy on 05/07/20.  She tolerated this reasonably well, still with some post-op tenderness but nothing unmanageable.  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  13 lymph node fragments were all negative for tumor.  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  We do also have the ALCHEMIST trial available, but unfortunately she is not a candidate due to her history of RA with MTX use.  She started Cis/Alimta on 07/11/2020.          She returns today for follow up and consideration of cycle 3 of Cis/Alimta after a week delay due to neutropenia.  She has been over the past week.  There are no GI or bowel complaints. No mucositis.  She is eating and drinking well.  Energy level is  OK.  There is exertional shortness of breath, no cough or edema.  No neuropathy.  No changes in baseline hearing loss.  She reports occasional abdominal pain that lasts a few minutes.  She also reports ongoing right breast pain, no masses or lumps.  Mammogram  was benign in August 2021-she reports pain is intermittent and was previous to mammo.  No fevers or infectious symptoms.  Labs reviewed and adequate to proceed with C3 as planned along with oral K+ replacement. All questions were asked and answered to  the best of my ability.  Return in 3 weeks for follow up and C4 Cis/Alimta or sooner if needed.                     Charna Elizabeth, NP    [facility] [place] [address]   Office : [phone]   Fax : [phone]

## 2020-09-11 ENCOUNTER — Encounter: Attending: Internal Medicine | Primary: Internal Medicine

## 2020-09-11 ENCOUNTER — Encounter: Primary: Internal Medicine

## 2020-09-12 ENCOUNTER — Ambulatory Visit: Payer: PRIVATE HEALTH INSURANCE | Primary: Internal Medicine

## 2020-09-18 ENCOUNTER — Encounter: Primary: Internal Medicine

## 2020-09-18 ENCOUNTER — Encounter: Attending: Hematology & Oncology | Primary: Internal Medicine

## 2020-09-18 ENCOUNTER — Encounter: Payer: MEDICARE | Attending: Internal Medicine | Primary: Internal Medicine

## 2020-09-19 ENCOUNTER — Encounter: Primary: Internal Medicine

## 2020-09-24 ENCOUNTER — Ambulatory Visit: Attending: Family | Primary: Internal Medicine

## 2020-09-24 ENCOUNTER — Inpatient Hospital Stay: Admit: 2020-09-24 | Payer: MEDICARE | Primary: Internal Medicine

## 2020-09-24 ENCOUNTER — Encounter: Primary: Internal Medicine

## 2020-09-24 ENCOUNTER — Ambulatory Visit: Admit: 2020-09-24 | Discharge: 2020-09-24 | Payer: MEDICARE | Attending: Family | Primary: Internal Medicine

## 2020-09-24 DIAGNOSIS — C3491 Malignant neoplasm of unspecified part of right bronchus or lung: Secondary | ICD-10-CM

## 2020-09-24 LAB — CBC WITH AUTO DIFFERENTIAL
Basophils %: 0 % (ref 0.0–2.0)
Basophils Absolute: 0 10*3/uL (ref 0.0–0.2)
Eosinophils %: 2 % (ref 0.5–7.8)
Eosinophils Absolute: 0.1 10*3/uL (ref 0.0–0.8)
Granulocyte Absolute Count: 0 10*3/uL (ref 0.0–0.5)
Hematocrit: 30 % — ABNORMAL LOW (ref 35.8–46.3)
Hemoglobin: 9.9 g/dL — ABNORMAL LOW (ref 11.7–15.4)
Immature Granulocytes: 0 % (ref 0.0–5.0)
Lymphocytes %: 57 % — ABNORMAL HIGH (ref 13–44)
Lymphocytes Absolute: 2.2 10*3/uL (ref 0.5–4.6)
MCH: 32.8 PG (ref 26.1–32.9)
MCHC: 33 g/dL (ref 31.4–35.0)
MCV: 99.3 FL — ABNORMAL HIGH (ref 79.6–97.8)
MPV: 10.1 FL (ref 9.4–12.3)
Monocytes %: 16 % — ABNORMAL HIGH (ref 4.0–12.0)
Monocytes Absolute: 0.6 10*3/uL (ref 0.1–1.3)
NRBC Absolute: 0 10*3/uL (ref 0.0–0.2)
Neutrophils %: 25 % — ABNORMAL LOW (ref 43–78)
Neutrophils Absolute: 1 10*3/uL — ABNORMAL LOW (ref 1.7–8.2)
Platelets: 233 10*3/uL (ref 150–450)
RBC: 3.02 M/uL — ABNORMAL LOW (ref 4.05–5.2)
RDW: 16.2 % — ABNORMAL HIGH (ref 11.9–14.6)
WBC: 3.9 10*3/uL — ABNORMAL LOW (ref 4.3–11.1)

## 2020-09-24 LAB — COMPREHENSIVE METABOLIC PANEL
ALT: 15 U/L (ref 12–65)
AST: 19 U/L (ref 15–37)
Albumin/Globulin Ratio: 1.1 — ABNORMAL LOW (ref 1.2–3.5)
Albumin: 3.4 g/dL (ref 3.2–4.6)
Alkaline Phosphatase: 39 U/L — ABNORMAL LOW (ref 50–136)
Anion Gap: 6 mmol/L — ABNORMAL LOW (ref 7–16)
BUN: 14 MG/DL (ref 8–23)
CO2: 27 mmol/L (ref 21–32)
Calcium: 8.3 MG/DL (ref 8.3–10.4)
Chloride: 105 mmol/L (ref 98–107)
Creatinine: 1.1 MG/DL — ABNORMAL HIGH (ref 0.6–1.0)
EGFR IF NonAfrican American: 51 mL/min/{1.73_m2} — ABNORMAL LOW (ref >60)
GFR African American: >60 mL/min/{1.73_m2} (ref >60)
Globulin: 3 g/dL (ref 2.3–3.5)
Glucose: 103 mg/dL — ABNORMAL HIGH (ref 65–100)
Potassium: 3.5 mmol/L (ref 3.5–5.1)
Sodium: 138 mmol/L (ref 136–145)
Total Bilirubin: 0.5 MG/DL (ref 0.2–1.1)
Total Protein: 6.4 g/dL (ref 6.3–8.2)

## 2020-09-24 LAB — MAGNESIUM
Magnesium: 1.7 mg/dL — ABNORMAL LOW (ref 1.8–2.4)
Magnesium: 1.7 mg/dL — ABNORMAL LOW (ref 1.8–2.4)

## 2020-09-24 LAB — CBC WITH AUTOMATED DIFF
ABS. BASOPHILS: 0 10*3/uL (ref 0.0–0.2)
ABS. EOSINOPHILS: 0.1 10*3/uL (ref 0.0–0.8)
ABS. IMM. GRANS.: 0 10*3/uL (ref 0.0–0.5)
ABS. LYMPHOCYTES: 2.2 10*3/uL (ref 0.5–4.6)
ABS. MONOCYTES: 0.6 10*3/uL (ref 0.1–1.3)
ABS. NEUTROPHILS: 1 10*3/uL — ABNORMAL LOW (ref 1.7–8.2)
ABSOLUTE NRBC: 0 10*3/uL (ref 0.0–0.2)
BASOPHILS: 0 % (ref 0.0–2.0)
EOSINOPHILS: 2 % (ref 0.5–7.8)
HCT: 30 % — ABNORMAL LOW (ref 35.8–46.3)
HGB: 9.9 g/dL — ABNORMAL LOW (ref 11.7–15.4)
IMMATURE GRANULOCYTES: 0 % (ref 0.0–5.0)
LYMPHOCYTES: 57 % — ABNORMAL HIGH (ref 13–44)
MCH: 32.8 PG (ref 26.1–32.9)
MCHC: 33 g/dL (ref 31.4–35.0)
MCV: 99.3 FL — ABNORMAL HIGH (ref 79.6–97.8)
MONOCYTES: 16 % — ABNORMAL HIGH (ref 4.0–12.0)
MPV: 10.1 FL (ref 9.4–12.3)
NEUTROPHILS: 25 % — ABNORMAL LOW (ref 43–78)
PLATELET: 233 10*3/uL (ref 150–450)
RBC: 3.02 M/uL — ABNORMAL LOW (ref 4.05–5.2)
RDW: 16.2 % — ABNORMAL HIGH (ref 11.9–14.6)
WBC: 3.9 10*3/uL — ABNORMAL LOW (ref 4.3–11.1)

## 2020-09-24 LAB — METABOLIC PANEL, COMPREHENSIVE
A-G Ratio: 1.1 — ABNORMAL LOW (ref 1.2–3.5)
ALT (SGPT): 15 U/L (ref 12–65)
AST (SGOT): 19 U/L (ref 15–37)
Albumin: 3.4 g/dL (ref 3.2–4.6)
Alk. phosphatase: 39 U/L — ABNORMAL LOW (ref 50–136)
Anion gap: 6 mmol/L — ABNORMAL LOW (ref 7–16)
BUN: 14 MG/DL (ref 8–23)
Bilirubin, total: 0.5 MG/DL (ref 0.2–1.1)
CO2: 27 mmol/L (ref 21–32)
Calcium: 8.3 MG/DL (ref 8.3–10.4)
Chloride: 105 mmol/L (ref 98–107)
Creatinine: 1.1 MG/DL — ABNORMAL HIGH (ref 0.6–1.0)
GFR est AA: >60 mL/min/{1.73_m2} (ref >60)
GFR est non-AA: 51 mL/min/{1.73_m2} — ABNORMAL LOW (ref >60)
Globulin: 3 g/dL (ref 2.3–3.5)
Glucose: 103 mg/dL — ABNORMAL HIGH (ref 65–100)
Potassium: 3.5 mmol/L (ref 3.5–5.1)
Protein, total: 6.4 g/dL (ref 6.3–8.2)
Sodium: 138 mmol/L (ref 136–145)

## 2020-09-24 MED ORDER — SALINE PERIPHERAL FLUSH PRN
INTRAMUSCULAR | Status: DC | PRN
Start: 2020-09-24 — End: 2020-09-26
  Administered 2020-09-24: 12:00:00

## 2020-09-24 NOTE — Progress Notes (Signed)
Patient arrived to port lab for port access and lab draw   Port accessed and labs drawn per protocol   Port remains accessed  Patient discharged from port lab ambulatory

## 2020-09-24 NOTE — Progress Notes (Signed)
09/24/20 saw pt today with Judson Roch, NP for pre chemo cycle 4 cis/alimta.  She is feeling well.  PO intake is good.  Will delay 1 week due to [place] 1000.  Repeat CT chest no contrast in 4 weeks.  Follow up in 1 week.  Encouraged to call with any concerns.  Navigation will continue to follow.

## 2020-09-24 NOTE — Progress Notes (Signed)
Progress Notes by Kerby Nora, NP at 09/24/20 0800                Author: Kerby Nora, NP  Service: --  Author Type: Nurse Practitioner       Filed: 09/24/20 0830  Encounter Date: 09/24/2020  Status: Signed          Editor: Kerby Nora, NP (Nurse Practitioner)               [facility] Hematology and Oncology: Office Visit Established Patient      Chief Complaint:       Chief Complaint       Patient presents with        ?  Follow-up           History of Present Illness:   Audrey Snow is a  80 y.o. female who presents today for follow-up regarding resected adenocarcinoma of the  lung.  On 02/27/20, Audrey Snow presented to [facility] ED with complaints of achiness in chest with radiation of pain to her left upper extremity with association of N/V, SOB and diaphoresis. Cardiac work-up was negative; however her chest x-ray reported a 5.1  cm rounded opacity at the right lung base.  She saw her PCP on 02/29/20, CT scan was ordered and reported a 5 cm mass with adjacent pleural nodularity posteriorly at the right lung base and considered suspicious for bronchogenic carcinoma. On 04/02/20 she  had a PET/CT scan that reported a 6 cm right lower lobe pulmonary mass with no metastatic disease.  Biopsy showed adenocarcinoma of lung origin.  We saw her prior to surgical evaluation and recommended proceeding with lobectomy and lymphadenectomy if  she was a surgical candidate.  She completed VATS with left lower lobectomy as well as chest wall mass resection and lymphadenectomy on 05/07/20.  Her tumor was 6.4 cm in greatest dimension with carcinoma in the specimen marked "chest wall" but with separation  from tumor by connective tissue and lung parenchyma.  13 lymph node fragments were all negative for tumor.  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  Because of the T3N0 disease, we would consider adjuvant  chemotherapy, likely cisplatin and pemetrexed for 4 cycles.   We do also have the ALCHEMIST trial available, but unfortunately she is not a candidate due to her history of RA with MTX use.       She is here today for follow up and consideration of C4 Cis/Alimta.  She has been well overall since last seen.  She denies any new concerns or complaints.  Her energy level is low, but she is managing.  She denies any bowel complaints.  She has mild  nausea, anti-emetics working well for this.  She denies any shortness of breath.  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  She denies any current issues with pain.  She denies any fevers, chills, or other infectious  symptoms.             Review of Systems:   Constitutional: Negative.    HENT: Negative.    Eyes: Negative.    Respiratory: Negative.    Cardiovascular: Negative.    Gastrointestinal: Positive for mild nausea.     Genitourinary: Negative.    Musculoskeletal: Negative.    Skin: Negative.    Neurological: Negative.    Endo/Heme/Allergies: Negative.    Psychiatric/Behavioral: Negative.  All other systems reviewed and are negative.            Allergies        Allergen  Reactions         ?  Yeast  Nausea and Vomiting             Pt. Says she hasnt had issues with this in a long time          Past Medical History:        Diagnosis  Date         ?  Arrhythmia  04/05/2020          PVCs.  04/05/20 was referred to [facility] for tachycardia, was seen 05/01/20 [facility]         ?  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  Autoimmune disease ([place])            RA         ?  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  Chronic obstructive pulmonary disease (HCC)            uses inhalers         ?  Chronic pain of left knee  06/25/2018     ?  Coronary atherosclerosis            noted on chest CT         ?  COVID-19 vaccine series completed  08/15/2019          Pfizer;  07/29/2019 and booster given 03/12/2020         ?  GERD (gastroesophageal reflux disease)            no meds at this time         ?  H/O cardiovascular stress test            Bruce protocol, low risk study, EF of 67% and normal LV function         ?  H/O mammogram  07/28/2016          mammogram and Ultrasound of right breaset- benign finding- [facility]          ?  Hearing loss            "only hears out of left ear"         ?  Hearing loss of both ears            no hearing aids          ?  HTN (hypertension), benign  01/30/2014          controlled w/med         ?  Hypothyroid            on med for control         ?  Menopause       ?  Osteoarthritis       ?  Osteoporosis            prolia          ?  Poor historian            patient had trouble remembering names of meds and what they were  for and had to repeat instructions for surgery several times          ?  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 Debbra Riding. Methotrexate Rx         ?  Sinusitis            sinus flonase          ?  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 Nancie Neas         ?  Unspecified hypothyroidism  01/30/2014          thyroid medication         ?  Vitamin D deficiency            medications for this           Past Surgical History:         Procedure  Laterality  Date          ?  COLONOSCOPY  N/A  01/27/2018          COLONOSCOPY/BMI 26 performed by Azalia Bilis, MD at [facility]          ?  HX CARPAL TUNNEL RELEASE  Bilateral       ?  HX COLONOSCOPY              colon polyps-adenomatous, has seen Dr. Kristopher Glee in past          ?  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  HX HEMORRHOIDECTOMY         ?  HX OTHER SURGICAL    09/2012          sinus surgery-Dr. Mickle Mallory          ?  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  HX TONSILLECTOMY    80 yrs old     ?  PR COLONOSCOPY FLX DX W/COLLJ SPEC WHEN PFRMD    01/27/2018                     Family History          Problem  Relation  Age of Onset          ?  OSTEOARTHRITIS  Mother       ?  Alzheimer's Disease  Father            ?  Breast Cancer  Neg Hx            Social History          Socioeconomic History         ?  Marital status:  MARRIED              Spouse name:  Not on file         ?  Number of children:  Not on file     ?  Years of education:  Not on file     ?  Highest education level:  Not on file       Occupational History        ?  Not on file  Tobacco Use         ?  Smoking status:  Former Smoker              Packs/day:  0.50         Years:  43.00         Pack years:  21.50         Types:  Cigarettes         Start date:  1961         Quit date:  1985         Years since quitting:  37.2         ?  Smokeless tobacco:  Never Used        ?  Tobacco comment: not regular when first started- 1pk/week       Vaping Use         ?  Vaping Use:  Never used       Substance and Sexual Activity         ?  Alcohol use:  No              Alcohol/week:  0.0 standard drinks         ?  Drug use:  No     ?  Sexual activity:  Not on file        Other Topics  Concern        ?  Not on file       Social History Narrative          Married and lives with husband.  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          Social Determinants of Health          Financial Resource Strain:         ?  Difficulty of Paying Living Expenses: Not on file       Food Insecurity:         ?  Worried About Running Out of Food in the Last Year: Not on file     ?  Ran Out of Food in the Last Year: Not on file       Transportation Needs:         ?  Lack of Transportation (Medical): Not on file     ?  Lack of Transportation (Non-Medical): Not on file       Physical Activity:         ?  Days of Exercise per Week: Not on file     ?  Minutes of Exercise per Session: Not on file       Stress:         ?  Feeling of Stress : Not on file       Social Connections:         ?  Frequency of Communication with Friends and Family: Not on file     ?  Frequency of Social  Gatherings with Friends and Family: Not on file     ?  Attends Religious Services: Not on file     ?  Active Member of Clubs or Organizations: Not on file     ?  Attends Archivist Meetings: Not on file     ?  Marital Status: Not on file       Intimate Partner Violence:         ?  Fear of Current or Ex-Partner: Not on file     ?  Emotionally Abused: Not on file     ?  Physically Abused: Not on file     ?  Sexually Abused: Not on file       Housing Stability:         ?  Unable to Pay for Housing in the Last Year: Not on file     ?  Number of Places Lived in the Last Year: Not on file        ?  Unstable Housing in the Last Year: Not on file          Current Outpatient Medications          Medication  Sig  Dispense  Refill           ?  losartan (COZAAR) 100 mg tablet  TAKE 1 TABLET BY MOUTH DAILY  90 Tablet  3     ?  ondansetron hcl (ZOFRAN) 8 mg tablet  Take 1 Tablet by mouth every eight (8) hours as needed for Nausea or Vomiting.  90 Tablet  1     ?  prochlorperazine (Compazine) 10 mg tablet  Take 1 Tablet by mouth every six (6) hours as needed for Nausea or Vomiting.  90 Tablet  1     ?  magnesium 250 mg tab  Take  by mouth daily.         ?  OTHER,NON-FORMULARY,  daily. Prevagen         ?  folic acid (FOLVITE) 1 mg tablet  Take 1 Tablet by mouth daily.  90 Tablet  3     ?  lidocaine-prilocaine (EMLA) topical cream  Apply  to affected area as needed for Pain.  30 g  0     ?  umeclidinium-vilanteroL (Anoro Ellipta) 62.5-25 mcg/actuation inhaler  Take 1 Puff by inhalation daily.  1 Each  11     ?  metoprolol succinate (TOPROL-XL) 50 mg XL tablet  Take 50 mg by mouth daily.         ?  atorvastatin (LIPITOR) 20 mg tablet  Take 20 mg by mouth daily.         ?  ergocalciferol (ERGOCALCIFEROL) 1,250 mcg (50,000 unit) capsule  Take 50,000 Units by mouth every fourteen (14) days.         ?  mupirocin (BACTROBAN) 2 % ointment  Apply  to affected area daily.  15 g  1           ?  levothyroxine (SYNTHROID) 100 mcg  tablet  Take 1 Tablet by mouth Daily (before breakfast). (Patient taking differently: Take 125 mcg by mouth Daily (before breakfast).)  30 Tablet  11           ?  gabapentin (NEURONTIN) 100 mg capsule  Take 100 mg by mouth three (3) times daily as needed.         ?  albuterol (PROVENTIL HFA, VENTOLIN HFA, PROAIR HFA) 90 mcg/actuation inhaler  Take 1 Puff by inhalation every six (6) hours as needed for Wheezing.  1 Inhaler  5     ?  fluticasone propionate (FLONASE) 50 mcg/actuation nasal spray  2 Sprays by Both Nostrils route daily. (Patient taking differently: 2 Sprays by Both Nostrils route daily as needed.)  1 Bottle  5     ?  denosumab (PROLIA) 60 mg/mL injection  60 mg by SubCUTAneous route every 6 months. Due December 2021         ?  methotrexate,  PF, 25 mg/mL injection  25 mg every seven (7) days. Friday evening         ?  nicotinic acid (NIACIN) 500 mg tablet  Take 500 mg by mouth Daily (before breakfast). 2 po everyday.               ?  multivitamin (ONE A DAY) tablet  Take 1 Tab by mouth daily.              Facility-Administered Medications Ordered in Other Visits             Medication  Dose  Route  Frequency  Provider  Last Rate  Last Admin              ?  saline peripheral flush soln 10 mL   10 mL  InterCATHeter  PRN  Greer Ee, MD     10 mL at 09/24/20 0746           OBJECTIVE:   Visit Vitals       BP  138/70 (BP 1 Location: Left arm)  Comment: Standing        Pulse  (!) 55     Temp  98.1 ??F (36.7 ??C)     Resp  20     Ht  '5\' 10"'  (1.778 m)     Wt  158 lb 3.2 oz (71.8 kg)     SpO2  96%        BMI  22.70 kg/m??           Physical Exam:      Constitutional:  Well developed, well nourished female in no acute  distress, sitting comfortably on the examination table.         HEENT:  Normocephalic and atraumatic. Sclerae anicteric. Neck supple without JVD. No thyromegaly present.      Lymph node     Deferred.          Skin  Warm and dry.  No bruising and no rash noted.  No erythema.  No pallor.          Respiratory  Lungs are clear to auscultation bilaterally without wheezes, rales or rhonchi, normal air exchange without accessory muscle use.      CVS  Normal rate, regular rhythm and normal S1 and S2.  No murmurs, gallops, or rubs.     Abdomen  Soft, nontender and nondistended, normoactive bowel sounds.  No palpable mass.       Neuro  Grossly nonfocal with no obvious sensory or motor deficits.     MSK  Normal range of motion in general.  No edema and no tenderness.        Psych  Appropriate mood and affect.         Labs:     Recent Results (from the past 24 hour(s))     CBC WITH AUTOMATED DIFF          Collection Time: 09/24/20  7:27 AM         Result  Value  Ref Range            WBC  3.9 (L)  4.3 - 11.1 K/uL       RBC  3.02 (L)  4.05 - 5.2 M/uL       HGB  9.9 (L)  11.7 - 15.4 g/dL       HCT  30.0 (L)  35.8 - 46.3 %       MCV  99.3 (H)  79.6 - 97.8 FL       MCH  32.8  26.1 - 32.9 PG       MCHC  33.0  31.4 - 35.0 g/dL       RDW  16.2 (H)  11.9 - 14.6 %       PLATELET  233  150 - 450 K/uL       MPV  10.1  9.4 - 12.3 FL       ABSOLUTE NRBC  0.00  0.0 - 0.2 K/uL       DF  AUTOMATED          NEUTROPHILS  25 (L)  43 - 78 %       LYMPHOCYTES  57 (H)  13 - 44 %       MONOCYTES  16 (H)  4.0 - 12.0 %       EOSINOPHILS  2  0.5 - 7.8 %       BASOPHILS  0  0.0 - 2.0 %       IMMATURE GRANULOCYTES  0  0.0 - 5.0 %       ABS. NEUTROPHILS  1.0 (L)  1.7 - 8.2 K/UL       ABS. LYMPHOCYTES  2.2  0.5 - 4.6 K/UL       ABS. MONOCYTES  0.6  0.1 - 1.3 K/UL       ABS. EOSINOPHILS  0.1  0.0 - 0.8 K/UL       ABS. BASOPHILS  0.0  0.0 - 0.2 K/UL            ABS. IMM. GRANS.  0.0  0.0 - 0.5 K/UL       METABOLIC PANEL, COMPREHENSIVE          Collection Time: 09/24/20  7:27 AM         Result  Value  Ref Range            Sodium  138  136 - 145 mmol/L       Potassium  3.5  3.5 - 5.1 mmol/L       Chloride  105  98 - 107 mmol/L       CO2  27  21 - 32 mmol/L       Anion gap  6 (L)  7 - 16 mmol/L       Glucose  103 (H)  65 - 100 mg/dL       BUN  14  8 - 23  MG/DL       Creatinine  1.10 (H)  0.6 - 1.0 MG/DL       GFR est AA  >60  >60 ml/min/1.78m       GFR est non-AA  51 (L)  >60 ml/min/1.746m      Calcium  8.3  8.3 - 10.4 MG/DL       Bilirubin, total  0.5  0.2 - 1.1 MG/DL       ALT (SGPT)  15  12 - 65 U/L       AST (SGOT)  19  15 - 37 U/L       Alk. phosphatase  39 (L)  50 - 136 U/L       Protein, total  6.4  6.3 - 8.2 g/dL       Albumin  3.4  3.2 - 4.6 g/dL       Globulin  3.0  2.3 - 3.5 g/dL  A-G Ratio  1.1 (L)  1.2 - 3.5         MAGNESIUM          Collection Time: 09/24/20  7:27 AM         Result  Value  Ref Range            Magnesium  1.7 (L)  1.8 - 2.4 mg/dL           Imaging:   EXAM: PET/CT TUMOR IMAGE SKULL THIGH W (INI)   ??   INDICATION: highly suspicious for lung cancer.   ??   PROCEDURE: Following IV injection of 13.91 mCi 18 Fluoro 2 deoxyglucose (FDG)   and a standard uptake delay, PET imaging is performed from head to thighs and   axial, sagittal and coronal images were acquired. Unenhanced CT is obtained for   anatomic localization, and attenuation correction of the PET scan. Patient   preprocedure blood glucose level: 1:14m/dL.   ??   CORRELATIVE IMAGING STUDIES: CT chest dated 02/29/2020.   ??   COMPARISON PET: No prior PET/CT   ??   HISTORY: The study is requested for initial treatment strategy. .   ??   FINDINGS:   ??   HEAD/NECK:    Mildly hypermetabolic nodule within the superficial left parotid gland, favored   to represent a lymph node. No other hypermetabolic cervical lymphadenopathy.   Cerebral evaluation is limited by normal intense activity.   ??   CHEST:    Redemonstrated 6 cm pulmonary mass within the right lower lobe which   demonstrates peripheral nodular hypermetabolic activity (max SUV 8.9).   Additional nodular area inferiorly along the right posterior pleura which   demonstrates increased metabolic activity (max SUV 8.9). No hypermetabolic hilar   or mediastinal lymphadenopathy. Scattered calcified atherosclerotic disease   ??    ABDOMEN/PELVIS:    No foci of abnormal hypermetabolism. Suspected physiologic uptake within the   gastrointestinal and genitourinary tracts.   ??   SKELETON:    No foci of abnormal hypermetabolism in the axial and visualized appendicular   skeleton.   ??   IMPRESSION   1.  The 6 cm right lower lobe pulmonary mass demonstrates peripheral nodular   hypermetabolic activity, concerning for primary lung neoplasm with central   necrosis.   2.  No hypermetabolic lymphadenopathy or distant metastatic disease.   3.  Mildly enlarged and metabolic left parotid lymph node, likely reactive in   Etiology.         Pathology:   * * *DIAGNOSIS* * * * * * * * * * * * * * * * * * * * * * * * * * * * * * * ??     ???? ?? A: ??"CHEST WALL MASS CONTENTS":   ???? ?? NECROTIC DEBRIS  WITH OUT IDENTIFIABLE VIABLE MALIGNANT TUMOR   ???? ??   B: ??"NUMBER 8 LYMPH NODE X2":   ???? ?? ANTHRACOTIC LYMPH NODE NEGATIVE FOR METASTATIC TUMOR   ???? ??   C: ??"NUMBER 7 LYMPH NODE X4":   ????  ?? FOUR FRAGMENTS OF ANTHRACOTIC LYMPH NODE NEGATIVE FOR METASTATIC   TUMOR   ???? ??   D: ??"INTERLOBAR LYMPH NODE":   ???? ?? FOUR FRAGMENTS OF ANTHRACOTIC LYMPH NODE NEGATIVE FOR METASTATIC   TUMOR   ????  ??   E: ??"RIGHT LOWER LOBE OF LUNG":   ???? ?? POORLY DIFFERENTIATED MALIGNANT TUMOR CONSISTENT WITH POORLY   DIFFERENTIATED ADENOCARCINOMA  MEASURING AP PROXIMALLY ??6.4 X 5.2 X 4.6 CM   WITH EXTENSIVE NECROSIS. BRONCHIAL  AND VASCULAR MARGINS OF RESECTION ARE   NEGATIVE FOR MALIGNANT TUMOR. FOUR ANTHRACOTIC PERIBRONCHIAL LYMPH NODES   NEGATIVE FOR METASTATIC TUMOR. SPECIMEN MARKED "CHEST WALL MASS" INVOLVED   BY POORLY DIFFERENTIATED MALIGNANT TUMOR SIMILAR  TO THAT WITHIN LUNG. SEE   COMMENT          * * *DIAGNOSIS* * * * * * * * * * * * * * * * * * * * * * * * * * * * * * * ??     ???? ?? "RIGHT LUNG MASS": ??IMMUNOHISTOCHEMICAL FINDINGS MOST CONSISTENT  WITH   ADENOCARCINOMA OF LUNG ORIGIN.           tls/04/17/2020     * * *Electronically Signed Out* * * ?? ?? ??   Sign Out Date: 04/18/2020  ??Charles A. Renella Cunas, MD           * * *PROCEDURES/ADDENDA*  * * * * * * * * * * * * * * * * * * * * ??* * * * ??     ???? ?? ?? ?? ?? ?? ?? ?? ??STF-IMMUNOHISTOCHEMISTRY     ???? ?? ?? ?? ?? ?? ?? ?? ?? ?? ?? ?? ?? ?? ?? ?? ?? ?? ??  ?? ?? ?? ?? ?? ?? ?? ?? ?? ?? ?? ?? ?? ?? ?? ?? ?? ??   ??Status: ??Reviewed By:   ??Charles A. Lorel Monaco, III, MD on 04/18/2020   ???? ??   ???? ?? ?? ?? ?? ?? ?? ?? ??  ?? ?? Interpretation   ???? ?? ?? ?? ?? ?? ?? ?? ?? ??Immunohistochemical Stain Panel:     ???? ?? Interpretation: ??Immunohistochemical findings most consistent with   adenocarcinoma of lung origin.      Antibody/Test ?? ?? ?? ?? ?? ?? ?? Marker For ?? ?? ?? ?? ?? ?? ?? ?? ?? ?? ?? ?? ?? ?? ??Result   CK 5/6 ?? ?? ?? ?? ?? ?? ?? ?? ?? ??Squamous cell carcinomas and mesotheliomas ??  ?? ??   ???? ??Negative   p40 ?? ?? ?? ?? ?? ?? ?? ?? ?? ??Squamous differentiation ?? ?? ?? ?? ?? ?? ?? ?? ?? ??   Negative   TTF-1 ?? ?? ?? ?? ?? ?? ?? ?? ?? ?? Lung  and thyroid adenocarcinoma ?? ?? ?? ?? ?? ?? ??   Positive   Napsin ?? ?? ?? ?? ?? ?? ?? ?? ?? ?? Lung adenocarcinoma ?? ?? ?? ?? ?? ?? ?? ?? ?? ?? ?? ??   Negative ??                ASSESSMENT:             ICD-10-CM  ICD-9-CM             1.  Adenocarcinoma of right lung (HCC)   C34.91  162.9  CBC WITH AUTOMATED DIFF                METABOLIC PANEL, COMPREHENSIVE           2.  Adenocarcinoma of right lung, stage 2 (HCC)   C34.91  162.9  CT CHEST WO CONT           Problem List   Date Reviewed:  09/24/2020  Codes  Class  Noted             Adenocarcinoma of right lung, stage 2 ([place])  ICD-10-CM: C34.91   ICD-9-CM: 162.9    06/12/2020                       Side effect of medication  ICD-10-CM: T88.7XXA   ICD-9-CM: 995.20    06/12/2020                       Large cell carcinoma of lung, right ([place])  ICD-10-CM: C34.91   ICD-9-CM: 162.9    05/07/2020                       Bronchogenic cancer of right lung ([place])  ICD-10-CM: C34.91   ICD-9-CM: 162.9    04/28/2020                       Right lower lobe lung mass  ICD-10-CM: R91.8   ICD-9-CM: 786.6    03/20/2020                       Dry skin dermatitis  ICD-10-CM: L85.3   ICD-9-CM: 692.89     01/03/2020                       Bilateral leg cramps  ICD-10-CM: R25.2   ICD-9-CM: 729.82    01/03/2020                       Elevated blood sugar  ICD-10-CM: R73.9   ICD-9-CM: 790.29    01/03/2020                       Hyperlipidemia  ICD-10-CM: E78.5   ICD-9-CM: 272.4    01/04/2019                       Chronic obstructive pulmonary disease ([place])  ICD-10-CM: J44.9   ICD-9-CM: 644    01/04/2019                       Pre-ulcerative corn or callous  ICD-10-CM: L84   ICD-9-CM: 700    06/25/2018                       Chronic pain of left knee  ICD-10-CM: M25.562, G89.29   ICD-9-CM: 719.46, 338.29    01/26/2018          Overview Signed 02/29/2020 12:10 PM by Selinda Orion, NP            Last Assessment & Plan:    Formatting of this note might be different from the original.   Discussed in detail today.  We will see if we can get Visco therapy approved.  Referral to PT.                                   Prediabetes  ICD-10-CM: R73.03   ICD-9-CM: 790.29    06/10/2016                       Chronic right-sided low back pain without sciatica  ICD-10-CM: M54.50, G89.29   ICD-9-CM: 724.2, 338.29  02/12/2016                       Environmental allergies  ICD-10-CM: Z91.09   ICD-9-CM: V15.09    10/05/2014                       Rheumatoid arthritis involving multiple sites with positive rheumatoid factor (HCC)  ICD-10-CM: M05.79   ICD-9-CM: 714.0    01/30/2014          Overview Signed 02/29/2020 12:10 PM by Selinda Orion, NP            Last Assessment & Plan:    Formatting of this note might be different from the original.   Chronic/Stable. Denies any recent RA flares. Patient is taking MTX and folic acid. Denies any adverse side effects. Continue with current treatment plan. Reviewed previous labs. Ordered labs today. Will continue to monitor over time.      Instructed patient to contact our office with any new or worsening symptoms.                                   Hypothyroidism  ICD-10-CM: E03.9   ICD-9-CM: 244.9    01/30/2014                        Vitamin D deficiency  ICD-10-CM: E55.9   ICD-9-CM: 268.9    01/30/2014          Overview Signed 02/29/2020 12:10 PM by Selinda Orion, NP            Last Assessment & Plan:    Formatting of this note might be different from the original.   Encouraged appropriate vitamin D supplementation. Monitor for signs or symptoms of hypercalcemia.                                   Osteoporosis  ICD-10-CM: M81.0   ICD-9-CM: 733.00    01/30/2014                       Gastroesophageal reflux disease without esophagitis  ICD-10-CM: K21.9   ICD-9-CM: 530.81    01/30/2014                       HTN (hypertension)  ICD-10-CM: I10   ICD-9-CM: 401.9    01/30/2014                             PLAN:   Lab studies were personally reviewed.      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  PET/CT shows no distant disease.  Clinically T3N0M0.  She completed VATS with left lower lobectomy as well as chest wall mass resection  and lymphadenectomy on 05/07/20.  She tolerated this reasonably well, still with some post-op tenderness but nothing unmanageable.  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  13 lymph node fragments were all negative for tumor.  At the very least, the tumor size makes her T3, she is pN0 as well (pathologic  stage IIB).  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  We do also have the ALCHEMIST trial available, but unfortunately she is not a candidate due to her history of RA with MTX use.  She started Cis/Alimta on 07/11/2020.          She is here today for follow up and consideration of C4 Cis/Alimta.  She has been well overall since last seen.  She denies any new concerns or complaints.  Her energy level is low, but she is managing.  She  denies any bowel complaints.  She has mild  nausea, anti-emetics working well for this.  She denies any shortness of breath.  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  She denies any current issues with pain.  She denies any fevers, chills, or other infectious  symptoms.  Labs reviewed and ANC 1.0, ANC needs to be 1.5 for Alimta.  Will hold chemo for 1 week and then proceed with C4, then with CT chest afterwards.  Return in 1 week for recheck on labs and C4 of Cis/Alimta, then after CT scan.                   Kerby Nora, NP    [facility] [place] [address]   Office : [phone]   Fax : [phone]

## 2020-10-01 ENCOUNTER — Inpatient Hospital Stay: Admit: 2020-10-01 | Payer: MEDICARE | Primary: Internal Medicine

## 2020-10-01 DIAGNOSIS — Z5111 Encounter for antineoplastic chemotherapy: Secondary | ICD-10-CM

## 2020-10-01 LAB — CBC WITH AUTO DIFFERENTIAL
Basophils %: 1 % (ref 0.0–2.0)
Basophils Absolute: 0.1 10*3/uL (ref 0.0–0.2)
Eosinophils %: 1 % (ref 0.5–7.8)
Eosinophils Absolute: 0.1 10*3/uL (ref 0.0–0.8)
Granulocyte Absolute Count: 0 10*3/uL (ref 0.0–0.5)
Hematocrit: 30.5 % — ABNORMAL LOW (ref 35.8–46.3)
Hemoglobin: 10.2 g/dL — ABNORMAL LOW (ref 11.7–15.4)
Immature Granulocytes: 0 % (ref 0.0–5.0)
Lymphocytes %: 43 % (ref 13–44)
Lymphocytes Absolute: 2.1 10*3/uL (ref 0.5–4.6)
MCH: 33.6 PG — ABNORMAL HIGH (ref 26.1–32.9)
MCHC: 33.4 g/dL (ref 31.4–35.0)
MCV: 100.3 FL — ABNORMAL HIGH (ref 79.6–97.8)
MPV: 10.1 FL (ref 9.4–12.3)
Monocytes %: 14 % — ABNORMAL HIGH (ref 4.0–12.0)
Monocytes Absolute: 0.7 10*3/uL (ref 0.1–1.3)
NRBC Absolute: 0 10*3/uL (ref 0.0–0.2)
Neutrophils %: 40 % — ABNORMAL LOW (ref 43–78)
Neutrophils Absolute: 1.9 10*3/uL (ref 1.7–8.2)
Platelets: 225 10*3/uL (ref 150–450)
RBC: 3.04 M/uL — ABNORMAL LOW (ref 4.05–5.2)
RDW: 16.7 % — ABNORMAL HIGH (ref 11.9–14.6)
WBC: 4.8 10*3/uL (ref 4.3–11.1)

## 2020-10-01 LAB — COMPREHENSIVE METABOLIC PANEL
ALT: 14 U/L (ref 12–65)
AST: 21 U/L (ref 15–37)
Albumin/Globulin Ratio: 1 — ABNORMAL LOW (ref 1.2–3.5)
Albumin: 3.3 g/dL (ref 3.2–4.6)
Alkaline Phosphatase: 42 U/L — ABNORMAL LOW (ref 50–136)
Anion Gap: 8 mmol/L (ref 7–16)
BUN: 10 MG/DL (ref 8–23)
CO2: 25 mmol/L (ref 21–32)
Calcium: 8.2 MG/DL — ABNORMAL LOW (ref 8.3–10.4)
Chloride: 107 mmol/L (ref 98–107)
Creatinine: 1.2 MG/DL — ABNORMAL HIGH (ref 0.6–1.0)
EGFR IF NonAfrican American: 46 mL/min/{1.73_m2} — ABNORMAL LOW (ref >60)
GFR African American: 55 mL/min/{1.73_m2} — ABNORMAL LOW (ref >60)
Globulin: 3.2 g/dL (ref 2.3–3.5)
Glucose: 104 mg/dL — ABNORMAL HIGH (ref 65–100)
Potassium: 3.1 mmol/L — ABNORMAL LOW (ref 3.5–5.1)
Sodium: 140 mmol/L (ref 136–145)
Total Bilirubin: 0.6 MG/DL (ref 0.2–1.1)
Total Protein: 6.5 g/dL (ref 6.3–8.2)

## 2020-10-01 LAB — PHOSPHORUS
Phosphorus: 3.3 MG/DL (ref 2.3–3.7)
Phosphorus: 3.3 MG/DL (ref 2.3–3.7)

## 2020-10-01 LAB — CBC WITH AUTOMATED DIFF
ABS. BASOPHILS: 0.1 10*3/uL (ref 0.0–0.2)
ABS. EOSINOPHILS: 0.1 10*3/uL (ref 0.0–0.8)
ABS. IMM. GRANS.: 0 10*3/uL (ref 0.0–0.5)
ABS. LYMPHOCYTES: 2.1 10*3/uL (ref 0.5–4.6)
ABS. MONOCYTES: 0.7 10*3/uL (ref 0.1–1.3)
ABS. NEUTROPHILS: 1.9 10*3/uL (ref 1.7–8.2)
ABSOLUTE NRBC: 0 10*3/uL (ref 0.0–0.2)
BASOPHILS: 1 % (ref 0.0–2.0)
EOSINOPHILS: 1 % (ref 0.5–7.8)
HCT: 30.5 % — ABNORMAL LOW (ref 35.8–46.3)
HGB: 10.2 g/dL — ABNORMAL LOW (ref 11.7–15.4)
IMMATURE GRANULOCYTES: 0 % (ref 0.0–5.0)
LYMPHOCYTES: 43 % (ref 13–44)
MCH: 33.6 PG — ABNORMAL HIGH (ref 26.1–32.9)
MCHC: 33.4 g/dL (ref 31.4–35.0)
MCV: 100.3 FL — ABNORMAL HIGH (ref 79.6–97.8)
MONOCYTES: 14 % — ABNORMAL HIGH (ref 4.0–12.0)
MPV: 10.1 FL (ref 9.4–12.3)
NEUTROPHILS: 40 % — ABNORMAL LOW (ref 43–78)
PLATELET: 225 10*3/uL (ref 150–450)
RBC: 3.04 M/uL — ABNORMAL LOW (ref 4.05–5.2)
RDW: 16.7 % — ABNORMAL HIGH (ref 11.9–14.6)
WBC: 4.8 10*3/uL (ref 4.3–11.1)

## 2020-10-01 LAB — METABOLIC PANEL, COMPREHENSIVE
A-G Ratio: 1 — ABNORMAL LOW (ref 1.2–3.5)
ALT (SGPT): 14 U/L (ref 12–65)
AST (SGOT): 21 U/L (ref 15–37)
Albumin: 3.3 g/dL (ref 3.2–4.6)
Alk. phosphatase: 42 U/L — ABNORMAL LOW (ref 50–136)
Anion gap: 8 mmol/L (ref 7–16)
BUN: 10 MG/DL (ref 8–23)
Bilirubin, total: 0.6 MG/DL (ref 0.2–1.1)
CO2: 25 mmol/L (ref 21–32)
Calcium: 8.2 MG/DL — ABNORMAL LOW (ref 8.3–10.4)
Chloride: 107 mmol/L (ref 98–107)
Creatinine: 1.2 MG/DL — ABNORMAL HIGH (ref 0.6–1.0)
GFR est AA: 55 mL/min/{1.73_m2} — ABNORMAL LOW (ref >60)
GFR est non-AA: 46 mL/min/{1.73_m2} — ABNORMAL LOW (ref >60)
Globulin: 3.2 g/dL (ref 2.3–3.5)
Glucose: 104 mg/dL — ABNORMAL HIGH (ref 65–100)
Potassium: 3.1 mmol/L — ABNORMAL LOW (ref 3.5–5.1)
Protein, total: 6.5 g/dL (ref 6.3–8.2)
Sodium: 140 mmol/L (ref 136–145)

## 2020-10-01 MED ORDER — FOSAPREPITANT 150 MG IV SOLUTION
150 mg | Freq: Once | INTRAVENOUS | Status: AC
Start: 2020-10-01 — End: 2020-10-01
  Administered 2020-10-01: 15:00:00 via INTRAVENOUS

## 2020-10-01 MED ORDER — POTASSIUM CHLORIDE 2 MEQ/ML IV SOLN
2 mEq/mL | Freq: Once | INTRAVENOUS | Status: AC
Start: 2020-10-01 — End: 2020-10-01
  Administered 2020-10-01: 13:00:00 via INTRAVENOUS

## 2020-10-01 MED ORDER — CISPLATIN 1 MG/ML IV SOLN
1 mg/mL | Freq: Once | INTRAVENOUS | Status: AC
Start: 2020-10-01 — End: 2020-10-01
  Administered 2020-10-01: 16:00:00 via INTRAVENOUS

## 2020-10-01 MED ORDER — ONDANSETRON (PF) 4 MG/2 ML INJECTION
4 mg/2 mL | Freq: Once | INTRAMUSCULAR | Status: AC
Start: 2020-10-01 — End: 2020-10-01
  Administered 2020-10-01: 14:00:00 via INTRAVENOUS

## 2020-10-01 MED ORDER — CENTRAL LINE FLUSH
0.9 % | INTRAMUSCULAR | Status: AC | PRN
Start: 2020-10-01 — End: 2020-10-01
  Administered 2020-10-01 (×9)

## 2020-10-01 MED ORDER — SODIUM CHLORIDE 0.9 % IV
500 mg | Freq: Once | INTRAVENOUS | Status: AC
Start: 2020-10-01 — End: 2020-10-01
  Administered 2020-10-01: 15:00:00 via INTRAVENOUS

## 2020-10-01 MED ORDER — SODIUM CHLORIDE 0.9 % IV
INTRAVENOUS | Status: AC
Start: 2020-10-01 — End: 2020-10-01
  Administered 2020-10-01: 12:00:00 via INTRAVENOUS

## 2020-10-01 MED ORDER — CYANOCOBALAMIN 1,000 MCG/ML IJ SOLN
1000 mcg/mL | Freq: Once | INTRAMUSCULAR | Status: AC
Start: 2020-10-01 — End: 2020-10-01
  Administered 2020-10-01: 14:00:00 via INTRAMUSCULAR

## 2020-10-01 MED ORDER — SODIUM CHLORIDE 0.9 % IV
4 mg/mL | Freq: Once | INTRAVENOUS | Status: AC
Start: 2020-10-01 — End: 2020-10-01
  Administered 2020-10-01: 14:00:00 via INTRAVENOUS

## 2020-10-01 MED ORDER — SODIUM CHLORIDE 0.9 % IV
4 mEq/mL (50 %) | Freq: Once | INTRAVENOUS | Status: AC
Start: 2020-10-01 — End: 2020-10-01
  Administered 2020-10-01: 18:00:00 via INTRAVENOUS

## 2020-10-01 MED FILL — CYANOCOBALAMIN 1,000 MCG/ML IJ SOLN: 1000 mcg/mL | INTRAMUSCULAR | Qty: 1

## 2020-10-01 MED FILL — SODIUM CHLORIDE 0.9 % IV: INTRAVENOUS | Qty: 1000

## 2020-10-01 MED FILL — ALIMTA 500 MG INTRAVENOUS SOLUTION: 500 mg | INTRAVENOUS | Qty: 20

## 2020-10-01 MED FILL — DEXAMETHASONE SODIUM PHOSPHATE 4 MG/ML IJ SOLN: 4 mg/mL | INTRAMUSCULAR | Qty: 3

## 2020-10-01 MED FILL — CISPLATIN 1 MG/ML IV SOLN: 1 mg/mL | INTRAVENOUS | Qty: 139.5

## 2020-10-01 MED FILL — ONDANSETRON (PF) 4 MG/2 ML INJECTION: 4 mg/2 mL | INTRAMUSCULAR | Qty: 4

## 2020-10-01 MED FILL — FOSAPREPITANT 150 MG IV SOLUTION: 150 mg | INTRAVENOUS | Qty: 5

## 2020-10-01 NOTE — Progress Notes (Signed)
Pt arrived ambulatory today at 0710, to receive labs & IV chemotherapy: alimta/cisplatin.  Pt tolerated without difficulty.  Patient discharged via ambulatory accompanied by self.  Instructed to notify physician of any problems, questions or concerns.  Allowed opportunity for patient/family to ask questions.  Verbalized understanding.  Next appointment is April 19 at 1400 with Dr. Mikal Plane.

## 2020-10-01 NOTE — Progress Notes (Signed)
Problem: Knowledge Deficit  Goal: *Participate in the learning process  Outcome: Progressing Towards Goal  Goal: *Verbalize description of procedure  Outcome: Progressing Towards Goal  Goal: *Verbalizes understanding and describes medication purposes and frequencies  Outcome: Progressing Towards Goal  Goal: *Knowledge of discharge instructions  Outcome: Progressing Towards Goal  Goal: Interventions  Outcome: Progressing Towards Goal     Problem: Patient Education: Go to Patient Education Activity  Goal: Patient/Family Education  Outcome: Progressing Towards Goal

## 2020-10-22 ENCOUNTER — Inpatient Hospital Stay: Admit: 2020-10-22 | Payer: MEDICARE | Attending: Family | Primary: Internal Medicine

## 2020-10-22 ENCOUNTER — Encounter

## 2020-10-22 DIAGNOSIS — C3491 Malignant neoplasm of unspecified part of right bronchus or lung: Secondary | ICD-10-CM

## 2020-10-23 ENCOUNTER — Ambulatory Visit: Attending: Internal Medicine | Primary: Internal Medicine

## 2020-10-23 ENCOUNTER — Ambulatory Visit: Payer: MEDICARE | Primary: Internal Medicine

## 2020-10-23 ENCOUNTER — Inpatient Hospital Stay: Admit: 2020-10-23 | Payer: MEDICARE | Primary: Internal Medicine

## 2020-10-23 ENCOUNTER — Ambulatory Visit: Admit: 2020-10-23 | Discharge: 2020-10-23 | Payer: MEDICARE | Attending: Internal Medicine | Primary: Internal Medicine

## 2020-10-23 ENCOUNTER — Encounter

## 2020-10-23 DIAGNOSIS — C3491 Malignant neoplasm of unspecified part of right bronchus or lung: Secondary | ICD-10-CM

## 2020-10-23 LAB — CBC WITH AUTO DIFFERENTIAL
Basophils %: 0 % (ref 0.0–2.0)
Basophils Absolute: 0 10*3/uL (ref 0.0–0.2)
Eosinophils %: 2 % (ref 0.5–7.8)
Eosinophils Absolute: 0.1 10*3/uL (ref 0.0–0.8)
Granulocyte Absolute Count: 0 10*3/uL (ref 0.0–0.5)
Hematocrit: 28.9 % — ABNORMAL LOW (ref 35.8–46.3)
Hemoglobin: 9.6 g/dL — ABNORMAL LOW (ref 11.7–15.4)
Immature Granulocytes: 0 % (ref 0.0–5.0)
Lymphocytes %: 50 % — ABNORMAL HIGH (ref 13–44)
Lymphocytes Absolute: 1.5 10*3/uL (ref 0.5–4.6)
MCH: 33.7 PG — ABNORMAL HIGH (ref 26.1–32.9)
MCHC: 33.2 g/dL (ref 31.4–35.0)
MCV: 101.4 FL — ABNORMAL HIGH (ref 79.6–97.8)
MPV: 10.2 FL (ref 9.4–12.3)
Monocytes %: 8 % (ref 4.0–12.0)
Monocytes Absolute: 0.2 10*3/uL (ref 0.1–1.3)
NRBC Absolute: 0 10*3/uL (ref 0.0–0.2)
Neutrophils %: 40 % — ABNORMAL LOW (ref 43–78)
Neutrophils Absolute: 1.2 10*3/uL — ABNORMAL LOW (ref 1.7–8.2)
Platelets: 207 10*3/uL (ref 150–450)
RBC: 2.85 M/uL — ABNORMAL LOW (ref 4.05–5.2)
RDW: 15.9 % — ABNORMAL HIGH (ref 11.9–14.6)
WBC: 2.9 10*3/uL — ABNORMAL LOW (ref 4.3–11.1)

## 2020-10-23 LAB — COMPREHENSIVE METABOLIC PANEL
ALT: 19 U/L (ref 12–65)
AST: 24 U/L (ref 15–37)
Albumin/Globulin Ratio: 1.1 — ABNORMAL LOW (ref 1.2–3.5)
Albumin: 3.6 g/dL (ref 3.2–4.6)
Alkaline Phosphatase: 43 U/L — ABNORMAL LOW (ref 50–136)
Anion Gap: 6 mmol/L — ABNORMAL LOW (ref 7–16)
BUN: 10 MG/DL (ref 8–23)
CO2: 27 mmol/L (ref 21–32)
Calcium: 9.2 MG/DL (ref 8.3–10.4)
Chloride: 104 mmol/L (ref 98–107)
Creatinine: 1.3 MG/DL — ABNORMAL HIGH (ref 0.6–1.0)
EGFR IF NonAfrican American: 42 mL/min/{1.73_m2} — ABNORMAL LOW (ref >60)
GFR African American: 51 mL/min/{1.73_m2} — ABNORMAL LOW (ref >60)
Globulin: 3.2 g/dL (ref 2.3–3.5)
Glucose: 117 mg/dL — ABNORMAL HIGH (ref 65–100)
Potassium: 3.2 mmol/L — ABNORMAL LOW (ref 3.5–5.1)
Sodium: 137 mmol/L (ref 136–145)
Total Bilirubin: 0.9 MG/DL (ref 0.2–1.1)
Total Protein: 6.8 g/dL (ref 6.3–8.2)

## 2020-10-23 LAB — MAGNESIUM
Magnesium: 1.8 mg/dL (ref 1.8–2.4)
Magnesium: 1.8 mg/dL (ref 1.8–2.4)

## 2020-10-23 LAB — PHOSPHORUS
Phosphorus: 3.7 MG/DL (ref 2.3–3.7)
Phosphorus: 3.7 MG/DL (ref 2.3–3.7)

## 2020-10-23 LAB — CBC WITH AUTOMATED DIFF
ABS. BASOPHILS: 0 10*3/uL (ref 0.0–0.2)
ABS. EOSINOPHILS: 0.1 10*3/uL (ref 0.0–0.8)
ABS. IMM. GRANS.: 0 10*3/uL (ref 0.0–0.5)
ABS. LYMPHOCYTES: 1.5 10*3/uL (ref 0.5–4.6)
ABS. MONOCYTES: 0.2 10*3/uL (ref 0.1–1.3)
ABS. NEUTROPHILS: 1.2 10*3/uL — ABNORMAL LOW (ref 1.7–8.2)
ABSOLUTE NRBC: 0 10*3/uL (ref 0.0–0.2)
BASOPHILS: 0 % (ref 0.0–2.0)
EOSINOPHILS: 2 % (ref 0.5–7.8)
HCT: 28.9 % — ABNORMAL LOW (ref 35.8–46.3)
HGB: 9.6 g/dL — ABNORMAL LOW (ref 11.7–15.4)
IMMATURE GRANULOCYTES: 0 % (ref 0.0–5.0)
LYMPHOCYTES: 50 % — ABNORMAL HIGH (ref 13–44)
MCH: 33.7 PG — ABNORMAL HIGH (ref 26.1–32.9)
MCHC: 33.2 g/dL (ref 31.4–35.0)
MCV: 101.4 FL — ABNORMAL HIGH (ref 79.6–97.8)
MONOCYTES: 8 % (ref 4.0–12.0)
MPV: 10.2 FL (ref 9.4–12.3)
NEUTROPHILS: 40 % — ABNORMAL LOW (ref 43–78)
PLATELET: 207 10*3/uL (ref 150–450)
RBC: 2.85 M/uL — ABNORMAL LOW (ref 4.05–5.2)
RDW: 15.9 % — ABNORMAL HIGH (ref 11.9–14.6)
WBC: 2.9 10*3/uL — ABNORMAL LOW (ref 4.3–11.1)

## 2020-10-23 LAB — METABOLIC PANEL, COMPREHENSIVE
A-G Ratio: 1.1 — ABNORMAL LOW (ref 1.2–3.5)
ALT (SGPT): 19 U/L (ref 12–65)
AST (SGOT): 24 U/L (ref 15–37)
Albumin: 3.6 g/dL (ref 3.2–4.6)
Alk. phosphatase: 43 U/L — ABNORMAL LOW (ref 50–136)
Anion gap: 6 mmol/L — ABNORMAL LOW (ref 7–16)
BUN: 10 MG/DL (ref 8–23)
Bilirubin, total: 0.9 MG/DL (ref 0.2–1.1)
CO2: 27 mmol/L (ref 21–32)
Calcium: 9.2 MG/DL (ref 8.3–10.4)
Chloride: 104 mmol/L (ref 98–107)
Creatinine: 1.3 MG/DL — ABNORMAL HIGH (ref 0.6–1.0)
GFR est AA: 51 mL/min/{1.73_m2} — ABNORMAL LOW (ref >60)
GFR est non-AA: 42 mL/min/{1.73_m2} — ABNORMAL LOW (ref >60)
Globulin: 3.2 g/dL (ref 2.3–3.5)
Glucose: 117 mg/dL — ABNORMAL HIGH (ref 65–100)
Potassium: 3.2 mmol/L — ABNORMAL LOW (ref 3.5–5.1)
Protein, total: 6.8 g/dL (ref 6.3–8.2)
Sodium: 137 mmol/L (ref 136–145)

## 2020-10-23 NOTE — Progress Notes (Signed)
Progress Notes by Greer Ee, MD at 10/23/20 1400                Author: Greer Ee, MD  Service: --  Author Type: Physician       Filed: 10/23/20 1456  Encounter Date: 10/23/2020  Status: Signed          Editor: Greer Ee, MD (Physician)               [facility] Hematology [facility] Oncology: Office Visit Established Patient      Chief Complaint:       Chief Complaint       Patient presents with        ?  Follow-up           History of Present Illness:   Audrey Snow is a  80 y.o. female who presents today for follow-up regarding resected adenocarcinoma of the  lung.  On 02/27/20, Ms. Kulaga presented to [facility] ED with complaints of achiness in chest with radiation of pain to her left upper extremity with association of N/V, SOB and diaphoresis. Cardiac work-up was negative; however her chest x-ray reported a 5.1  cm rounded opacity at the right lung base.  She saw her PCP on 02/29/20, CT scan was ordered and reported a 5 cm mass with adjacent pleural nodularity posteriorly at the right lung base and considered suspicious for bronchogenic carcinoma. On 04/02/20 she  had a PET/CT scan that reported a 6 cm right lower lobe pulmonary mass with no metastatic disease.  Biopsy showed adenocarcinoma of lung origin.  We saw her prior to surgical evaluation and recommended proceeding with lobectomy and lymphadenectomy if  she was a surgical candidate.  She completed VATS with left lower lobectomy as well as chest wall mass resection and lymphadenectomy on 05/07/20.  Her tumor was 6.4 cm in greatest dimension with carcinoma in the specimen marked "chest wall" but with separation  from tumor by connective tissue and lung parenchyma.  13 lymph node fragments were all negative for tumor.  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  Because of the T3N0 disease, we would consider adjuvant  chemotherapy, likely cisplatin and pemetrexed for 4 cycles.  We do also have the  ALCHEMIST trial available, but unfortunately she is not a candidate due to her history of RA with MTX use.       She is here today for follow up.  She completed 4 cycles of cisplatin and Alimta with adequate tolerance.  She feels like she did great with therapy, but her husband notes that she was more fatigued this past cycle.  She also had more nausea and required  the antiemetics for therapy.  She denies any bowel complaints.  She denies any current issues with pain.  However, she does have a fullness/swelling inferior to the thoracotomy site which is bothersome to her.  She denies any fevers, chills, or other  infectious symptoms.             Review of Systems:   Constitutional: Negative.    HENT: Negative.    Eyes: Negative.    Respiratory: Negative.    Cardiovascular: Negative.    Gastrointestinal: Negative.     Genitourinary: Negative.    Musculoskeletal: Negative.    Skin: Negative.    Neurological: Negative.    Endo/Heme/Allergies: Negative.    Psychiatric/Behavioral: Negative.    All other systems  reviewed and are negative.            Allergies        Allergen  Reactions         ?  Yeast  Nausea and Vomiting             Pt. Says she hasnt had issues with this in a long time          Past Medical History:        Diagnosis  Date         ?  Arrhythmia  04/05/2020          PVCs.  04/05/20 was referred to [facility] for tachycardia, was seen 05/01/20 [facility]         ?  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  Autoimmune disease ([place])            RA         ?  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  Chronic obstructive pulmonary disease (HCC)            uses inhalers         ?  Chronic pain of left knee  06/25/2018     ?  Coronary atherosclerosis            noted on chest CT         ?  COVID-19 vaccine series completed  08/15/2019          Pfizer; 07/29/2019 and booster given  03/12/2020         ?  GERD (gastroesophageal reflux disease)            no meds at this time         ?  H/O cardiovascular stress test            Bruce protocol, low risk study, EF of 67% and normal LV function         ?  H/O mammogram  07/28/2016          mammogram and Ultrasound of right breaset- benign finding- [facility]          ?  Hearing loss            "only hears out of left ear"         ?  Hearing loss of both ears            no hearing aids          ?  HTN (hypertension), benign  01/30/2014          controlled w/med         ?  Hypothyroid            on med for control         ?  Menopause       ?  Osteoarthritis       ?  Osteoporosis            prolia          ?  Poor historian            patient had trouble remembering names of meds and what they were for and had  to repeat instructions for surgery several times          ?  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 Debbra Riding. Methotrexate Rx         ?  Sinusitis            sinus flonase          ?  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 Nancie Neas         ?  Unspecified hypothyroidism  01/30/2014          thyroid medication         ?  Vitamin D deficiency            medications for this           Past Surgical History:         Procedure  Laterality  Date          ?  COLONOSCOPY  N/A  01/27/2018          COLONOSCOPY/BMI 26 performed by Azalia Bilis, [place] at [place]          ?  HX CARPAL TUNNEL RELEASE  Bilateral       ?  HX COLONOSCOPY              colon polyps-adenomatous, has seen Dr. Kristopher Glee in past          ?  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  HX HEMORRHOIDECTOMY         ?  HX OTHER SURGICAL    09/2012          sinus surgery-Dr. Mickle Mallory          ?  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  HX TONSILLECTOMY    80 yrs old     ?  PR COLONOSCOPY FLX DX W/COLLJ SPEC WHEN PFRMD    01/27/2018                     Family History         Problem  Relation   Age of Onset          ?  OSTEOARTHRITIS  Mother       ?  Alzheimer's Disease  Father            ?  Breast Cancer  Neg Hx            Social History          Socioeconomic History         ?  Marital status:  MARRIED              Spouse name:  Not on file         ?  Number of children:  Not on file     ?  Years of education:  Not on file     ?  Highest education level:  Not on file       Occupational History        ?  Not on file  Tobacco Use         ?  Smoking status:  Former Smoker              Packs/day:  0.50         Years:  43.00         Pack years:  21.50         Types:  Cigarettes         Start date:  1961         Quit date:  1985         Years since quitting:  37.3         ?  Smokeless tobacco:  Never Used        ?  Tobacco comment: not regular when first started- 1pk/week       Vaping Use         ?  Vaping Use:  Never used       Substance and Sexual Activity         ?  Alcohol use:  No              Alcohol/week:  0.0 standard drinks         ?  Drug use:  No     ?  Sexual activity:  Not on file        Other Topics  Concern        ?  Not on file       Social History Narrative          Married and lives with husband.  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          Social Determinants of Health          Financial Resource Strain:         ?  Difficulty of Paying Living Expenses: Not on file       Food Insecurity:         ?  Worried About Running Out of Food in the Last Year: Not on file     ?  Ran Out of Food in the Last Year: Not on file       Transportation Needs:         ?  Lack of Transportation (Medical): Not on file     ?  Lack of Transportation (Non-Medical): Not on file       Physical Activity:         ?  Days of Exercise per Week: Not on file     ?  Minutes of Exercise per Session: Not on file       Stress:         ?  Feeling of Stress : Not on file       Social Connections:         ?  Frequency of Communication with Friends and Family: Not on file     ?  Frequency of Social Gatherings with Friends and  Family: Not on file     ?  Attends Religious Services: Not on file     ?  Active Member of Clubs or Organizations: Not on file     ?  Attends Archivist Meetings: Not on file     ?  Marital Status: Not on file       Intimate Partner Violence:         ?  Fear of Current or Ex-Partner: Not on file     ?  Emotionally Abused: Not on file     ?  Physically Abused: Not on file     ?  Sexually Abused: Not on file       Housing Stability:         ?  Unable to Pay for Housing in the Last Year: Not on file     ?  Number of Places Lived in the Last Year: Not on file        ?  Unstable Housing in the Last Year: Not on file          Current Outpatient Medications          Medication  Sig  Dispense  Refill           ?  losartan (COZAAR) 100 mg tablet  TAKE 1 TABLET BY MOUTH DAILY  90 Tablet  3     ?  ondansetron hcl (ZOFRAN) 8 mg tablet  Take 1 Tablet by mouth every eight (8) hours as needed for Nausea or Vomiting.  90 Tablet  1     ?  prochlorperazine (Compazine) 10 mg tablet  Take 1 Tablet by mouth every six (6) hours as needed for Nausea or Vomiting.  90 Tablet  1     ?  OTHER,NON-FORMULARY,  daily. Prevagen         ?  folic acid (FOLVITE) 1 mg tablet  Take 1 Tablet by mouth daily.  90 Tablet  3     ?  lidocaine-prilocaine (EMLA) topical cream  Apply  to affected area as needed for Pain.  30 g  0     ?  umeclidinium-vilanteroL (Anoro Ellipta) 62.5-25 mcg/actuation inhaler  Take 1 Puff by inhalation daily.  1 Each  11     ?  atorvastatin (LIPITOR) 20 mg tablet  Take 20 mg by mouth daily.         ?  ergocalciferol (ERGOCALCIFEROL) 1,250 mcg (50,000 unit) capsule  Take 50,000 Units by mouth every fourteen (14) days.         ?  mupirocin (BACTROBAN) 2 % ointment  Apply  to affected area daily.  15 g  1     ?  levothyroxine (SYNTHROID) 100 mcg tablet  Take 1 Tablet by mouth Daily (before breakfast). (Patient taking differently: Take 125 mcg by mouth Daily (before breakfast).)  30 Tablet  11     ?  gabapentin (NEURONTIN)  100 mg capsule  Take 100 mg by mouth three (3) times daily as needed.         ?  albuterol (PROVENTIL HFA, VENTOLIN HFA, PROAIR HFA) 90 mcg/actuation inhaler  Take 1 Puff by inhalation every six (6) hours as needed for Wheezing.  1 Inhaler  5     ?  fluticasone propionate (FLONASE) 50 mcg/actuation nasal spray  2 Sprays by Both Nostrils route daily. (Patient taking differently: 2 Sprays by Both Nostrils route daily as needed.)  1 Bottle  5     ?  denosumab (PROLIA) 60 mg/mL injection  60 mg by SubCUTAneous route every 6 months. Due December 2021         ?  methotrexate, PF, 25 mg/mL injection  25 mg every seven (7) days. Friday evening         ?  nicotinic acid (NIACIN) 500 mg tablet  Take 500 mg by mouth Daily (before breakfast). 2 po everyday.         ?  multivitamin (ONE A DAY) tablet  Take 1 Tab by mouth daily.         ?  magnesium 250 mg tab  Take  by mouth daily. (Patient not taking: Reported on 10/23/2020)               ?  metoprolol succinate (TOPROL-XL) 50 mg XL tablet  Take 50 mg by mouth daily. (Patient not taking: Reported on 10/23/2020)               OBJECTIVE:   Visit Vitals       BP  111/76  Comment: standing        Pulse  68     Temp  97.3 ??F (36.3 ??C)     Resp  16     Ht  '5\' 10"'  (1.778 m)     Wt  152 lb 14.4 oz (69.4 kg)     SpO2  98%        BMI  21.94 kg/m??           Physical Exam:      Constitutional:  Well developed, well nourished female in no acute  distress, sitting comfortably on the examination table.         HEENT:  Normocephalic and atraumatic. Sclerae anicteric. Neck supple without JVD. No thyromegaly present.      Lymph node     Deferred.       Skin  Warm and dry.  No bruising and no rash noted.  No erythema.  No pallor.      Respiratory  Lungs are clear to auscultation bilaterally without wheezes, rales or rhonchi, normal air exchange without accessory muscle use.      CVS  Normal rate, regular rhythm and normal S1 and S2.  No murmurs, gallops, or rubs.     Abdomen  Soft, nontender and  nondistended, normoactive bowel sounds.  No palpable mass.       Neuro  Grossly nonfocal with no obvious sensory or motor deficits.     MSK  Normal range of motion in general.  No edema and no tenderness.        Psych  Appropriate mood and affect.         Labs:     Recent Results (from the past 24 hour(s))     CBC WITH AUTOMATED DIFF          Collection Time: 10/23/20  1:41 PM         Result  Value  Ref Range            WBC  2.9 (L)  4.3 - 11.1 K/uL       RBC  2.85 (L)  4.05 - 5.2 M/uL       HGB  9.6 (L)  11.7 - 15.4 g/dL       HCT  28.9 (L)  35.8 - 46.3 %       MCV  101.4 (H)  79.6 - 97.8 FL       MCH  33.7 (H)  26.1 - 32.9 PG       MCHC  33.2  31.4 - 35.0 g/dL       RDW  15.9 (H)  11.9 - 14.6 %       PLATELET  207  150 - 450 K/uL       MPV  10.2  9.4 - 12.3 FL       ABSOLUTE NRBC  0.00  0.0 - 0.2 K/uL  DF  AUTOMATED          NEUTROPHILS  40 (L)  43 - 78 %       LYMPHOCYTES  50 (H)  13 - 44 %       MONOCYTES  8  4.0 - 12.0 %       EOSINOPHILS  2  0.5 - 7.8 %       BASOPHILS  0  0.0 - 2.0 %       IMMATURE GRANULOCYTES  0  0.0 - 5.0 %       ABS. NEUTROPHILS  1.2 (L)  1.7 - 8.2 K/UL       ABS. LYMPHOCYTES  1.5  0.5 - 4.6 K/UL       ABS. MONOCYTES  0.2  0.1 - 1.3 K/UL       ABS. EOSINOPHILS  0.1  0.0 - 0.8 K/UL       ABS. BASOPHILS  0.0  0.0 - 0.2 K/UL       ABS. IMM. GRANS.  0.0  0.0 - 0.5 K/UL       METABOLIC PANEL, COMPREHENSIVE          Collection Time: 10/23/20  1:41 PM         Result  Value  Ref Range            Sodium  137  136 - 145 mmol/L       Potassium  3.2 (L)  3.5 - 5.1 mmol/L       Chloride  104  98 - 107 mmol/L       CO2  27  21 - 32 mmol/L       Anion gap  6 (L)  7 - 16 mmol/L       Glucose  117 (H)  65 - 100 mg/dL       BUN  10  8 - 23 MG/DL       Creatinine  1.30 (H)  0.6 - 1.0 MG/DL       GFR est AA  51 (L)  >60 ml/min/1.27m       GFR est non-AA  42 (L)  >60 ml/min/1.766m      Calcium  9.2  8.3 - 10.4 MG/DL       Bilirubin, total  0.9  0.2 - 1.1 MG/DL       ALT (SGPT)  19  12 - 65 U/L        AST (SGOT)  24  15 - 37 U/L       Alk. phosphatase  43 (L)  50 - 136 U/L       Protein, total  6.8  6.3 - 8.2 g/dL       Albumin  3.6  3.2 - 4.6 g/dL       Globulin  3.2  2.3 - 3.5 g/dL       A-G Ratio  1.1 (L)  1.2 - 3.5         MAGNESIUM          Collection Time: 10/23/20  1:41 PM         Result  Value  Ref Range            Magnesium  1.8  1.8 - 2.4 mg/dL       PHOSPHORUS          Collection Time: 10/23/20  1:41 PM         Result  Value  Ref Range            Phosphorus  3.7  2.3 - 3.7 MG/DL           Imaging:   EXAMINATION: CT CHEST WITHOUT INTRAVENOUS CONTRAST 10/22/2020 8:09 AM   ??   ACCESSION NUMBER: [id number]   ??   INDICATION: Adenocarcinoma right lung stage II follow-up   ??   COMPARISON: Chest x-ray 06/21/2020, chest CT 02/29/2020, PET CT 04/02/2020   ??   TECHNIQUE: Multiple contiguous axial CT images of the chest were obtained from   the lung apices to the lung bases after without intravenous contrast.    ??   Radiation dose reduction techniques were used for this study:  Our CT scanners   use one or all of the following: Automated exposure control, adjustment of the   mA and/or kVp according to patient's size, iterative reconstruction.   ??   FINDINGS:   ??   THORACIC AORTA: Scattered atherosclerosis.   ??   PROXIMAL GREAT VESSELS: Scattered atherosclerosis. Left central venous access   port with catheter tip in the superior vena cava.   ??   HEART: Multivessel coronary artery atherosclerosis.   ??   PERICARDIUM: Unremarkable   ??   MEDIASTINAL LYMPH NODES: Unremarkable   HILAR LYMPH NODES: Unremarkable   AXILLARY LYMPH NODES: Unremarkable   ??   PULMONARY PARENCHYMA: Surgical changes status post right lower lobectomy. Smooth   pleural thickening at the operative site is likely treatment related.   ??   A 0.2 cm right upper lobe nodule is stable (axial image 17) There are no new   suspicious pulmonary nodules or masses.   ??   The trachea and mainstem bronchi are patent.   ??   PLEURA: No pneumothorax. No pleural  effusion.   ??   VISUALIZED UPPER ABDOMEN: There is no free intraperitoneal gas in the included   portions of the upper abdomen.  New 0.4 x 1.1 cm left adrenal nodule (axial   image 58).   ??   OSSEOUS STRUCTURES: Thoracic spine spondylosis without suspicious lytic or   blastic bony lesions.   ??   IMPRESSION   ??   1.  Interval right lower lobectomy. Treatment related changes in the adjacent   pleura. No definite evidence of residual or locally recurrent disease.   ??   2.  No new suspicious pulmonary nodules or masses. No thoracic lymphadenopathy   within the limits of noncontrast technique.   ??   3.  New 1.4 x 1.1 cm left adrenal nodule. This may be an adrenal metastasis. PET   CT may be beneficial for further evaluation.         EXAM: PET/CT TUMOR IMAGE SKULL THIGH W ([facility])   ??   INDICATION: highly suspicious for lung cancer.   ??   PROCEDURE: Following IV injection of 13.91 mCi 18 Fluoro 2 deoxyglucose (FDG)   and a standard uptake delay, PET imaging is performed from head to thighs and   axial, sagittal and coronal images were acquired. Unenhanced CT is obtained for   anatomic localization, and attenuation correction of the PET scan. Patient   preprocedure blood glucose level: 1:34m/dL.   ??   CORRELATIVE IMAGING STUDIES: CT chest dated 02/29/2020.   ??   COMPARISON PET: No prior PET/CT   ??   HISTORY: The study is requested for initial treatment strategy. .   ??   FINDINGS:   ??   HEAD/NECK:  Mildly hypermetabolic nodule within the superficial left parotid gland, favored   to represent a lymph node. No other hypermetabolic cervical lymphadenopathy.   Cerebral evaluation is limited by normal intense activity.   ??   CHEST:    Redemonstrated 6 cm pulmonary mass within the right lower lobe which   demonstrates peripheral nodular hypermetabolic activity (max SUV 8.9).   Additional nodular area inferiorly along the right posterior pleura which   demonstrates increased metabolic activity (max SUV 8.9). No hypermetabolic  hilar   or mediastinal lymphadenopathy. Scattered calcified atherosclerotic disease   ??   ABDOMEN/PELVIS:    No foci of abnormal hypermetabolism. Suspected physiologic uptake within the   gastrointestinal and genitourinary tracts.   ??   SKELETON:    No foci of abnormal hypermetabolism in the axial and visualized appendicular   skeleton.   ??   IMPRESSION   1.  The 6 cm right lower lobe pulmonary mass demonstrates peripheral nodular   hypermetabolic activity, concerning for primary lung neoplasm with central   necrosis.   2.  No hypermetabolic lymphadenopathy or distant metastatic disease.   3.  Mildly enlarged and metabolic left parotid lymph node, likely reactive in   Etiology.         Pathology:   * * *DIAGNOSIS* * * * * * * * * * * * * * * * * * * * * * * * * * * * * * * ??     ???? ?? A: ??"CHEST WALL MASS CONTENTS":   ???? ?? NECROTIC DEBRIS  WITH OUT IDENTIFIABLE VIABLE MALIGNANT TUMOR   ???? ??   B: ??"NUMBER 8 LYMPH NODE X2":   ???? ?? ANTHRACOTIC LYMPH NODE NEGATIVE FOR METASTATIC TUMOR   ???? ??   C: ??"NUMBER 7 LYMPH NODE X4":   ????  ?? FOUR FRAGMENTS OF ANTHRACOTIC LYMPH NODE NEGATIVE FOR METASTATIC   TUMOR   ???? ??   D: ??"INTERLOBAR LYMPH NODE":   ???? ?? FOUR FRAGMENTS OF ANTHRACOTIC LYMPH NODE NEGATIVE FOR METASTATIC   TUMOR   ????  ??   E: ??"RIGHT LOWER LOBE OF LUNG":   ???? ?? POORLY DIFFERENTIATED MALIGNANT TUMOR CONSISTENT WITH POORLY   DIFFERENTIATED ADENOCARCINOMA MEASURING AP PROXIMALLY ??6.4 X 5.2 X 4.6 CM   WITH EXTENSIVE NECROSIS. BRONCHIAL  AND VASCULAR MARGINS OF RESECTION ARE   NEGATIVE FOR MALIGNANT TUMOR. FOUR ANTHRACOTIC PERIBRONCHIAL LYMPH NODES   NEGATIVE FOR METASTATIC TUMOR. SPECIMEN MARKED "CHEST WALL MASS" INVOLVED   BY POORLY DIFFERENTIATED MALIGNANT TUMOR SIMILAR  TO THAT WITHIN LUNG. SEE   COMMENT          * * *DIAGNOSIS* * * * * * * * * * * * * * * * * * * * * * * * * * * * * * * ??     ???? ?? "RIGHT LUNG MASS": ??IMMUNOHISTOCHEMICAL FINDINGS MOST CONSISTENT  WITH   ADENOCARCINOMA OF LUNG ORIGIN.            tls/04/17/2020     * * *Electronically Signed Out* * * ?? ?? ??   Sign Out Date: 04/18/2020 ??Charles A. Renella Cunas, MD           * * *PROCEDURES/ADDENDA*  * * * * * * * * * * * * * * * * * * * * ??* * * * ??     ???? ?? ?? ?? ?? ?? ?? ?? ??STF-IMMUNOHISTOCHEMISTRY     ???? ?? ?? ?? ?? ?? ?? ?? ?? ?? ?? ?? ?? ?? ?? ?? ?? ?? ??  ?? ?? ?? ?? ?? ?? ?? ?? ?? ?? ?? ?? ?? ?? ?? ?? ?? ??   ??  Status: ??Reviewed By:   ??Charles A. Lorel Monaco, III, MD on 04/18/2020   ???? ??   ???? ?? ?? ?? ?? ?? ?? ?? ??  ?? ?? Interpretation   ???? ?? ?? ?? ?? ?? ?? ?? ?? ??Immunohistochemical Stain Panel:     ???? ?? Interpretation: ??Immunohistochemical findings most consistent with   adenocarcinoma of lung origin.      Antibody/Test ?? ?? ?? ?? ?? ?? ?? Marker For ?? ?? ?? ?? ?? ?? ?? ?? ?? ?? ?? ?? ?? ?? ??Result   CK 5/6 ?? ?? ?? ?? ?? ?? ?? ?? ?? ??Squamous cell carcinomas and mesotheliomas ??  ?? ??   ???? ??Negative   p40 ?? ?? ?? ?? ?? ?? ?? ?? ?? ??Squamous differentiation ?? ?? ?? ?? ?? ?? ?? ?? ?? ??   Negative   TTF-1 ?? ?? ?? ?? ?? ?? ?? ?? ?? ?? Lung  and thyroid adenocarcinoma ?? ?? ?? ?? ?? ?? ??   Positive   Napsin ?? ?? ?? ?? ?? ?? ?? ?? ?? ?? Lung adenocarcinoma ?? ?? ?? ?? ?? ?? ?? ?? ?? ?? ?? ??   Negative ??                ASSESSMENT:             ICD-10-CM  ICD-9-CM             1.  Adenocarcinoma of right lung (HCC)   C34.91  162.9  PET/CT TUMOR IMAGE SKULL THIGH (SUB)           2.  Adrenal nodule (HCC)   E27.8  255.8  PET/CT TUMOR IMAGE SKULL THIGH (SUB)           Problem List   Date Reviewed:  Oct 29, 2020                        Codes  Class  Noted             Adenocarcinoma of right lung, stage 2 ([place])  ICD-10-CM: C34.91   ICD-9-CM: 162.9    06/12/2020                       Side effect of medication  ICD-10-CM: T88.7XXA   ICD-9-CM: 995.20    06/12/2020                       Large cell carcinoma of lung, right ([place])  ICD-10-CM: C34.91   ICD-9-CM: 162.9    05/07/2020                       Bronchogenic cancer of right lung ([place])  ICD-10-CM: C34.91   ICD-9-CM: 162.9    04/28/2020                       Right lower lobe lung mass  ICD-10-CM: R91.8   ICD-9-CM: 786.6    03/20/2020                        Dry skin dermatitis  ICD-10-CM: L85.3   ICD-9-CM: 692.89    01/03/2020                       Bilateral leg cramps  ICD-10-CM: R25.2   ICD-9-CM: 729.82    01/03/2020  Elevated blood sugar  ICD-10-CM: R73.9   ICD-9-CM: 790.29    01/03/2020                       Hyperlipidemia  ICD-10-CM: E78.5   ICD-9-CM: 272.4    01/04/2019                       Chronic obstructive pulmonary disease ([place])  ICD-10-CM: J44.9   ICD-9-CM: 875    01/04/2019                       Pre-ulcerative corn or callous  ICD-10-CM: L84   ICD-9-CM: 700    06/25/2018                       Chronic pain of left knee  ICD-10-CM: M25.562, G89.29   ICD-9-CM: 719.46, 338.29    01/26/2018          Overview Signed 02/29/2020 12:10 PM by Selinda Orion, NP            Last Assessment & Plan:    Formatting of this note might be different from the original.   Discussed in detail today.  We will see if we can get Visco therapy approved.  Referral to PT.                                   Prediabetes  ICD-10-CM: R73.03   ICD-9-CM: 790.29    06/10/2016                       Chronic right-sided low back pain without sciatica  ICD-10-CM: M54.50, G89.29   ICD-9-CM: 724.2, 338.29    02/12/2016                       Environmental allergies  ICD-10-CM: Z91.09   ICD-9-CM: V15.09    10/05/2014                       Rheumatoid arthritis involving multiple sites with positive rheumatoid factor (HCC)  ICD-10-CM: M05.79   ICD-9-CM: 714.0    01/30/2014          Overview Signed 02/29/2020 12:10 PM by Selinda Orion, NP            Last Assessment & Plan:    Formatting of this note might be different from the original.   Chronic/Stable. Denies any recent RA flares. Patient is taking MTX and folic acid. Denies any adverse side effects. Continue with current treatment plan. Reviewed previous labs. Ordered labs today. Will continue to monitor over time.      Instructed patient to contact our office with any new or worsening symptoms.                                    Hypothyroidism  ICD-10-CM: E03.9   ICD-9-CM: 244.9    01/30/2014                       Vitamin D deficiency  ICD-10-CM: E55.9   ICD-9-CM: 268.9    01/30/2014          Overview Signed 02/29/2020 12:10 PM by Selinda Orion, NP  Last Assessment & Plan:    Formatting of this note might be different from the original.   Encouraged appropriate vitamin D supplementation. Monitor for signs or symptoms of hypercalcemia.                                   Osteoporosis  ICD-10-CM: M81.0   ICD-9-CM: 733.00    01/30/2014                       Gastroesophageal reflux disease without esophagitis  ICD-10-CM: K21.9   ICD-9-CM: 530.81    01/30/2014                       HTN (hypertension)  ICD-10-CM: I10   ICD-9-CM: 401.9    01/30/2014                             PLAN:   Lab studies were personally reviewed.      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  PET/CT shows no distant disease.  Clinically T3N0M0.  She completed VATS with left lower lobectomy as well as chest wall mass resection  and lymphadenectomy on 05/07/20.  She tolerated this reasonably well, still with some post-op tenderness but nothing unmanageable.  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  13 lymph node fragments were all negative for tumor.  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  We do also have the ALCHEMIST trial available, but unfortunately she is not a candidate due to her history of RA with MTX use.  She started Cis/Alimta on 07/11/2020.          She is here today for follow up.  She completed 4 cycles of cisplatin and Alimta with adequate tolerance.  She feels like she did great with  therapy, but her husband notes that she was more fatigued this past cycle.  She also had more nausea and required  the antiemetics for therapy.  She denies any bowel complaints.  She denies any current issues with pain.  However, she does have a fullness/swelling inferior to the thoracotomy site which is bothersome to her.  She denies any fevers, chills, or other  infectious symptoms.  Labs reviewed and adequate, mild neutropenia likely residual from therapy.  CT chest reviewed and shows nothing of suspicion in the lung.  However, she does have a 1.4 cm adrenal nodule which was not obviously present on pretherapy  imaging.  We will check PET/CT to assess and then return for review of imaging, if suspicious she may need biopsy.  They understand and agree to proceed.  All questions were asked and answered to the best of my ability.                   Greer Ee, MD    [facility] [place] [address]   Office : [phone]   Fax : [phone]

## 2020-10-24 ENCOUNTER — Encounter

## 2020-11-08 ENCOUNTER — Inpatient Hospital Stay: Admit: 2020-11-08 | Payer: MEDICARE | Primary: Internal Medicine

## 2020-11-08 DIAGNOSIS — C3491 Malignant neoplasm of unspecified part of right bronchus or lung: Secondary | ICD-10-CM

## 2020-11-08 LAB — POCT GLUCOSE: POC Glucose: 104 mg/dL — ABNORMAL HIGH (ref 65–100)

## 2020-11-08 LAB — GLUCOSE, POC: Glucose (POC): 104 mg/dL — ABNORMAL HIGH (ref 65–100)

## 2020-11-08 MED ORDER — FLUDEOXYGLUCOSE F-18 20 MCI TO 200 MCI/ML INTRAVENOUS SOLUTION
20 mCi to 0 mCi/mL | Freq: Once | INTRAVENOUS | Status: AC
Start: 2020-11-08 — End: 2020-11-08
  Administered 2020-11-08: 15:00:00 via INTRAVENOUS

## 2020-11-08 MED ORDER — SALINE PERIPHERAL FLUSH PRN
Freq: Once | INTRAMUSCULAR | Status: AC
Start: 2020-11-08 — End: 2020-11-08
  Administered 2020-11-08: 15:00:00

## 2020-11-08 MED ORDER — DIATRIZOATE MEGLUMINE & SODIUM 66 %-10 % ORAL SOLN
66-10 % | Freq: Once | ORAL | Status: AC
Start: 2020-11-08 — End: 2020-11-08
  Administered 2020-11-08: 15:00:00 via ORAL

## 2020-11-09 ENCOUNTER — Ambulatory Visit: Attending: Internal Medicine | Primary: Internal Medicine

## 2020-11-09 ENCOUNTER — Ambulatory Visit: Admit: 2020-11-09 | Discharge: 2020-11-09 | Payer: MEDICARE | Attending: Internal Medicine | Primary: Internal Medicine

## 2020-11-09 ENCOUNTER — Inpatient Hospital Stay: Admit: 2020-11-09 | Payer: MEDICARE | Primary: Internal Medicine

## 2020-11-09 DIAGNOSIS — C3491 Malignant neoplasm of unspecified part of right bronchus or lung: Secondary | ICD-10-CM

## 2020-11-09 LAB — CBC WITH AUTO DIFFERENTIAL
Basophils %: 1 % (ref 0.0–2.0)
Basophils Absolute: 0.1 10*3/uL (ref 0.0–0.2)
Eosinophils %: 1 % (ref 0.5–7.8)
Eosinophils Absolute: 0.1 10*3/uL (ref 0.0–0.8)
Granulocyte Absolute Count: 0 10*3/uL (ref 0.0–0.5)
Hematocrit: 32.9 % — ABNORMAL LOW (ref 35.8–46.3)
Hemoglobin: 10.9 g/dL — ABNORMAL LOW (ref 11.7–15.4)
Immature Granulocytes: 0 % (ref 0.0–5.0)
Lymphocytes %: 33 % (ref 13–44)
Lymphocytes Absolute: 2.1 10*3/uL (ref 0.5–4.6)
MCH: 34.7 PG — ABNORMAL HIGH (ref 26.1–32.9)
MCHC: 33.1 g/dL (ref 31.4–35.0)
MCV: 104.8 FL — ABNORMAL HIGH (ref 79.6–97.8)
MPV: 9.8 FL (ref 9.4–12.3)
Monocytes %: 11 % (ref 4.0–12.0)
Monocytes Absolute: 0.7 10*3/uL (ref 0.1–1.3)
NRBC Absolute: 0 10*3/uL (ref 0.0–0.2)
Neutrophils %: 54 % (ref 43–78)
Neutrophils Absolute: 3.4 10*3/uL (ref 1.7–8.2)
Platelets: 257 10*3/uL (ref 150–450)
RBC: 3.14 M/uL — ABNORMAL LOW (ref 4.05–5.2)
RDW: 15.7 % — ABNORMAL HIGH (ref 11.9–14.6)
WBC: 6.3 10*3/uL (ref 4.3–11.1)

## 2020-11-09 LAB — COMPREHENSIVE METABOLIC PANEL
ALT: 23 U/L (ref 12–65)
AST: 23 U/L (ref 15–37)
Albumin/Globulin Ratio: 1.1 — ABNORMAL LOW (ref 1.2–3.5)
Albumin: 3.9 g/dL (ref 3.2–4.6)
Alkaline Phosphatase: 45 U/L — ABNORMAL LOW (ref 50–136)
Anion Gap: 5 mmol/L — ABNORMAL LOW (ref 7–16)
BUN: 9 MG/DL (ref 8–23)
CO2: 28 mmol/L (ref 21–32)
Calcium: 8.8 MG/DL (ref 8.3–10.4)
Chloride: 106 mmol/L (ref 98–107)
Creatinine: 1.3 MG/DL — ABNORMAL HIGH (ref 0.6–1.0)
EGFR IF NonAfrican American: 42 mL/min/{1.73_m2} — ABNORMAL LOW (ref >60)
GFR African American: 51 mL/min/{1.73_m2} — ABNORMAL LOW (ref >60)
Globulin: 3.4 g/dL (ref 2.3–3.5)
Glucose: 127 mg/dL — ABNORMAL HIGH (ref 65–100)
Potassium: 3.5 mmol/L (ref 3.5–5.1)
Sodium: 139 mmol/L (ref 136–145)
Total Bilirubin: 1 MG/DL (ref 0.2–1.1)
Total Protein: 7.3 g/dL (ref 6.3–8.2)

## 2020-11-09 LAB — CBC WITH AUTOMATED DIFF
ABS. BASOPHILS: 0.1 10*3/uL (ref 0.0–0.2)
ABS. EOSINOPHILS: 0.1 10*3/uL (ref 0.0–0.8)
ABS. IMM. GRANS.: 0 10*3/uL (ref 0.0–0.5)
ABS. LYMPHOCYTES: 2.1 10*3/uL (ref 0.5–4.6)
ABS. MONOCYTES: 0.7 10*3/uL (ref 0.1–1.3)
ABS. NEUTROPHILS: 3.4 10*3/uL (ref 1.7–8.2)
ABSOLUTE NRBC: 0 10*3/uL (ref 0.0–0.2)
BASOPHILS: 1 % (ref 0.0–2.0)
EOSINOPHILS: 1 % (ref 0.5–7.8)
HCT: 32.9 % — ABNORMAL LOW (ref 35.8–46.3)
HGB: 10.9 g/dL — ABNORMAL LOW (ref 11.7–15.4)
IMMATURE GRANULOCYTES: 0 % (ref 0.0–5.0)
LYMPHOCYTES: 33 % (ref 13–44)
MCH: 34.7 PG — ABNORMAL HIGH (ref 26.1–32.9)
MCHC: 33.1 g/dL (ref 31.4–35.0)
MCV: 104.8 FL — ABNORMAL HIGH (ref 79.6–97.8)
MONOCYTES: 11 % (ref 4.0–12.0)
MPV: 9.8 FL (ref 9.4–12.3)
NEUTROPHILS: 54 % (ref 43–78)
PLATELET: 257 10*3/uL (ref 150–450)
RBC: 3.14 M/uL — ABNORMAL LOW (ref 4.05–5.2)
RDW: 15.7 % — ABNORMAL HIGH (ref 11.9–14.6)
WBC: 6.3 10*3/uL (ref 4.3–11.1)

## 2020-11-09 LAB — METABOLIC PANEL, COMPREHENSIVE
A-G Ratio: 1.1 — ABNORMAL LOW (ref 1.2–3.5)
ALT (SGPT): 23 U/L (ref 12–65)
AST (SGOT): 23 U/L (ref 15–37)
Albumin: 3.9 g/dL (ref 3.2–4.6)
Alk. phosphatase: 45 U/L — ABNORMAL LOW (ref 50–136)
Anion gap: 5 mmol/L — ABNORMAL LOW (ref 7–16)
BUN: 9 MG/DL (ref 8–23)
Bilirubin, total: 1 MG/DL (ref 0.2–1.1)
CO2: 28 mmol/L (ref 21–32)
Calcium: 8.8 MG/DL (ref 8.3–10.4)
Chloride: 106 mmol/L (ref 98–107)
Creatinine: 1.3 MG/DL — ABNORMAL HIGH (ref 0.6–1.0)
GFR est AA: 51 mL/min/{1.73_m2} — ABNORMAL LOW (ref >60)
GFR est non-AA: 42 mL/min/{1.73_m2} — ABNORMAL LOW (ref >60)
Globulin: 3.4 g/dL (ref 2.3–3.5)
Glucose: 127 mg/dL — ABNORMAL HIGH (ref 65–100)
Potassium: 3.5 mmol/L (ref 3.5–5.1)
Protein, total: 7.3 g/dL (ref 6.3–8.2)
Sodium: 139 mmol/L (ref 136–145)

## 2020-11-09 NOTE — Progress Notes (Signed)
Progress Notes by Greer Ee, MD at 11/09/20 1015                Author: Greer Ee, MD  Service: --  Author Type: Physician       Filed: 11/09/20 1125  Encounter Date: 11/09/2020  Status: Signed          Editor: Greer Ee, MD (Physician)               [facility]: Office Visit Established Patient      Chief Complaint:       Chief Complaint       Patient presents with        ?  Follow-up           History of Present Illness:   Audrey Snow is a  80 y.o. female who presents today for follow-up regarding resected adenocarcinoma of the  lung.  On 02/27/20, Audrey Snow presented to [facility] ED with complaints of achiness in chest with radiation of pain to her left upper extremity with association of N/V, SOB and diaphoresis. Cardiac work-up was negative; however her chest x-ray reported a 5.1  cm rounded opacity at the right lung base.  She saw her PCP on 02/29/20, CT scan was ordered and reported a 5 cm mass with adjacent pleural nodularity posteriorly at the right lung base and considered suspicious for bronchogenic carcinoma. On 04/02/20 she  had a PET/CT scan that reported a 6 cm right lower lobe pulmonary mass with no metastatic disease.  Biopsy showed adenocarcinoma of lung origin.  We saw her prior to surgical evaluation and recommended proceeding with lobectomy and lymphadenectomy if  she was a surgical candidate.  She completed VATS with left lower lobectomy as well as chest wall mass resection and lymphadenectomy on 05/07/20.  Her tumor was 6.4 cm in greatest dimension with carcinoma in the specimen marked "chest wall" but with separation  from tumor by connective tissue and lung parenchyma.  13 lymph node fragments were all negative for tumor.  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  Because of the T3N0 disease, we would consider adjuvant  chemotherapy, likely cisplatin and pemetrexed for 4 cycles.  We do also have the  ALCHEMIST trial available, but unfortunately she is not a candidate due to her history of RA with MTX use.  She completed 4 cycles of cisplatin and Alimta with adequate tolerance.   CT chest at the conclusion of therapy reviewed and shows nothing of suspicion in the lung.  However, she does have a 1.4 cm adrenal nodule which was not obviously present on pretherapy imaging.  We will check PET/CT to assess and then return for review  of imaging, if suspicious she may need biopsy.      Here for follow-up.  No changes, she remains fatigued with some baseline shortness of breath since completing chemotherapy.  No other new symptoms, ECOG 1.               Review of Systems:   Constitutional: Positive for fatigue.    HENT: Negative.    Eyes: Negative.    Respiratory: Positive for shortness of breath.    Cardiovascular: Negative.    Gastrointestinal: Negative.     Genitourinary: Negative.    Musculoskeletal: Negative.    Skin: Negative.    Neurological: Negative.    Endo/Heme/Allergies: Negative.    Psychiatric/Behavioral: Negative.  All other systems reviewed and are negative.            Allergies        Allergen  Reactions         ?  Yeast  Nausea and Vomiting             Pt. Says she hasnt had issues with this in a long time          Past Medical History:        Diagnosis  Date         ?  Arrhythmia  04/05/2020          PVCs.  04/05/20 was referred to [facility] for tachycardia, was seen 05/01/20 [facility]         ?  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  Autoimmune disease ([place])            RA         ?  Bronchogenic cancer of right lung ([place])  04/28/2020          adenocarcinoma         ?  Chronic obstructive pulmonary disease (HCC)            uses inhalers         ?  Chronic pain of left knee  06/25/2018     ?  Coronary atherosclerosis            noted on chest CT         ?  COVID-19 vaccine series completed  08/15/2019           Pfizer; 07/29/2019 and booster given 03/12/2020         ?  GERD (gastroesophageal reflux disease)            no meds at this time         ?  H/O cardiovascular stress test            Bruce protocol, low risk study, EF of 67% and normal LV function         ?  H/O mammogram  07/28/2016          mammogram and Ultrasound of right breaset- benign finding- [facility]          ?  Hearing loss            "only hears out of left ear"         ?  Hearing loss of both ears            no hearing aids          ?  HTN (hypertension), benign  01/30/2014          controlled w/med         ?  Hypothyroid            on med for control         ?  Menopause       ?  Osteoarthritis       ?  Osteoporosis            prolia          ?  Poor historian            patient had trouble remembering names of meds and what they were  for and had to repeat instructions for surgery several times          ?  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 Debbra Riding. Methotrexate Rx         ?  Sinusitis            sinus flonase          ?  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 Nancie Neas         ?  Unspecified hypothyroidism  01/30/2014          thyroid medication         ?  Vitamin D deficiency            medications for this           Past Surgical History:         Procedure  Laterality  Date          ?  COLONOSCOPY  N/A  01/27/2018          COLONOSCOPY/BMI 26 performed by Azalia Bilis, MD at [place]          ?  HX CARPAL TUNNEL RELEASE  Bilateral       ?  HX COLONOSCOPY              colon polyps-adenomatous, has seen Dr. Kristopher Glee in past          ?  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  HX HEMORRHOIDECTOMY         ?  HX OTHER SURGICAL    09/2012          sinus surgery-Dr. Mickle Mallory          ?  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  HX TONSILLECTOMY    80 yrs old     ?  PR COLONOSCOPY FLX DX W/COLLJ SPEC WHEN PFRMD    01/27/2018                     Family  History         Problem  Relation  Age of Onset          ?  OSTEOARTHRITIS  Mother       ?  Alzheimer's Disease  Father            ?  Breast Cancer  Neg Hx            Social History          Socioeconomic History         ?  Marital status:  MARRIED              Spouse name:  Not on file         ?  Number of children:  Not on file     ?  Years of education:  Not on file     ?  Highest education level:  Not on file       Occupational History        ?  Not on file  Tobacco Use         ?  Smoking status:  Former Smoker              Packs/day:  0.50         Years:  43.00         Pack years:  21.50         Types:  Cigarettes         Start date:  1961         Quit date:  1985         Years since quitting:  37.3         ?  Smokeless tobacco:  Never Used        ?  Tobacco comment: not regular when first started- 1pk/week       Vaping Use         ?  Vaping Use:  Never used       Substance and Sexual Activity         ?  Alcohol use:  No              Alcohol/week:  0.0 standard drinks         ?  Drug use:  No     ?  Sexual activity:  Not on file        Other Topics  Concern        ?  Not on file       Social History Narrative          Married and lives with husband.  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          Social Determinants of Health          Financial Resource Strain:         ?  Difficulty of Paying Living Expenses: Not on file       Food Insecurity:         ?  Worried About Running Out of Food in the Last Year: Not on file     ?  Ran Out of Food in the Last Year: Not on file       Transportation Needs:         ?  Lack of Transportation (Medical): Not on file     ?  Lack of Transportation (Non-Medical): Not on file       Physical Activity:         ?  Days of Exercise per Week: Not on file     ?  Minutes of Exercise per Session: Not on file       Stress:         ?  Feeling of Stress : Not on file       Social Connections:         ?  Frequency of Communication with Friends and Family: Not on file     ?  Frequency of  Social Gatherings with Friends and Family: Not on file     ?  Attends Religious Services: Not on file     ?  Active Member of Clubs or Organizations: Not on file     ?  Attends Archivist Meetings: Not on file     ?  Marital Status: Not on file       Intimate Partner Violence:         ?  Fear of Current or Ex-Partner: Not on file     ?  Emotionally Abused: Not on file     ?  Physically Abused: Not on file     ?  Sexually Abused: Not on file       Housing Stability:         ?  Unable to Pay for Housing in the Last Year: Not on file     ?  Number of Places Lived in the Last Year: Not on file        ?  Unstable Housing in the Last Year: Not on file          Current Outpatient Medications          Medication  Sig  Dispense  Refill           ?  losartan (COZAAR) 100 mg tablet  TAKE 1 TABLET BY MOUTH DAILY  90 Tablet  3     ?  ondansetron hcl (ZOFRAN) 8 mg tablet  Take 1 Tablet by mouth every eight (8) hours as needed for Nausea or Vomiting.  90 Tablet  1     ?  prochlorperazine (Compazine) 10 mg tablet  Take 1 Tablet by mouth every six (6) hours as needed for Nausea or Vomiting.  90 Tablet  1     ?  OTHER,NON-FORMULARY,  daily. Prevagen         ?  folic acid (FOLVITE) 1 mg tablet  Take 1 Tablet by mouth daily.  90 Tablet  3     ?  lidocaine-prilocaine (EMLA) topical cream  Apply  to affected area as needed for Pain.  30 g  0     ?  umeclidinium-vilanteroL (Anoro Ellipta) 62.5-25 mcg/actuation inhaler  Take 1 Puff by inhalation daily.  1 Each  11     ?  metoprolol succinate (TOPROL-XL) 50 mg XL tablet  Take 50 mg by mouth daily.         ?  atorvastatin (LIPITOR) 20 mg tablet  Take 20 mg by mouth daily.         ?  ergocalciferol (ERGOCALCIFEROL) 1,250 mcg (50,000 unit) capsule  Take 50,000 Units by mouth every fourteen (14) days.         ?  mupirocin (BACTROBAN) 2 % ointment  Apply  to affected area daily.  15 g  1           ?  levothyroxine (SYNTHROID) 100 mcg tablet  Take 1 Tablet by mouth Daily (before  breakfast). (Patient taking differently: Take 125 mcg by mouth Daily (before breakfast).)  30 Tablet  11           ?  gabapentin (NEURONTIN) 100 mg capsule  Take 100 mg by mouth three (3) times daily as needed.         ?  albuterol (PROVENTIL HFA, VENTOLIN HFA, PROAIR HFA) 90 mcg/actuation inhaler  Take 1 Puff by inhalation every six (6) hours as needed for Wheezing.  1 Inhaler  5     ?  fluticasone propionate (FLONASE) 50 mcg/actuation nasal spray  2 Sprays by Both Nostrils route daily. (Patient taking differently: 2 Sprays by Both Nostrils route daily as needed.)  1 Bottle  5     ?  denosumab (PROLIA) 60 mg/mL injection  60 mg by SubCUTAneous route every 6 months. Due December 2021         ?  methotrexate, PF, 25 mg/mL injection  25 mg every seven (7) days. Friday evening         ?  nicotinic acid (NIACIN) 500 mg tablet  Take 500 mg by mouth Daily (before breakfast). 2 po everyday.         ?  multivitamin (ONE A DAY) tablet  Take 1 Tab by mouth daily.               ?  magnesium 250 mg tab  Take  by mouth daily. (Patient not taking: Reported on 10/23/2020)               OBJECTIVE:   Visit Vitals       BP  120/75  Comment: standig        Pulse  (!) 57     Temp  97.7 ??F (36.5 ??C)     Resp  16     Ht  5\' 10"  (1.778 m)     Wt  148 lb 4.8 oz (67.3 kg)     SpO2  96%        BMI  21.28 kg/m??           Physical Exam:      Constitutional:  Well developed, well nourished female in no acute  distress, sitting comfortably on the examination table.         HEENT:  Normocephalic and atraumatic. Sclerae anicteric. Neck supple without JVD. No thyromegaly present.      Lymph node     Deferred.       Skin  Warm and dry.  No bruising and no rash noted.  No erythema.  No pallor.      Respiratory  Lungs are clear to auscultation bilaterally without wheezes, rales or rhonchi, normal air exchange without accessory muscle use.      CVS  Normal rate, regular rhythm and normal S1 and S2.  No murmurs, gallops, or rubs.     Abdomen  Soft,  nontender and nondistended, normoactive bowel sounds.  No palpable mass.       Neuro  Grossly nonfocal with no obvious sensory or motor deficits.     MSK  Normal range of motion in general.  No edema and no tenderness.        Psych  Appropriate mood and affect.         Labs:     Recent Results (from the past 24 hour(s))     GLUCOSE, POC          Collection Time: 11/08/20 10:55 AM         Result  Value  Ref Range            Glucose (POC)  104 (H)  65 - 100 mg/dL       Performed by  [facility]         CBC WITH AUTOMATED DIFF          Collection Time: 11/09/20 10:04 AM         Result  Value  Ref Range            WBC  6.3  4.3 - 11.1 K/uL       RBC  3.14 (L)  4.05 - 5.2 M/uL       HGB  10.9 (L)  11.7 - 15.4 g/dL       HCT  32.9 (L)  35.8 - 46.3 %       MCV  104.8 (H)  79.6 - 97.8 FL       MCH  34.7 (H)  26.1 - 32.9 PG  MCHC  33.1  31.4 - 35.0 g/dL       RDW  15.7 (H)  11.9 - 14.6 %       PLATELET  257  150 - 450 K/uL       MPV  9.8  9.4 - 12.3 FL       ABSOLUTE NRBC  0.00  0.0 - 0.2 K/uL       NEUTROPHILS  54  43 - 78 %       LYMPHOCYTES  33  13 - 44 %       MONOCYTES  11  4.0 - 12.0 %       EOSINOPHILS  1  0.5 - 7.8 %       BASOPHILS  1  0.0 - 2.0 %       IMMATURE GRANULOCYTES  0  0.0 - 5.0 %       ABS. NEUTROPHILS  3.4  1.7 - 8.2 K/UL       ABS. LYMPHOCYTES  2.1  0.5 - 4.6 K/UL       ABS. MONOCYTES  0.7  0.1 - 1.3 K/UL       ABS. EOSINOPHILS  0.1  0.0 - 0.8 K/UL       ABS. BASOPHILS  0.1  0.0 - 0.2 K/UL            ABS. IMM. GRANS.  0.0  0.0 - 0.5 K/UL            DF  AUTOMATED              Imaging:   EXAMINATION: CT CHEST WITHOUT INTRAVENOUS CONTRAST 10/22/2020 8:09 AM   ??   ACCESSION NUMBER: [id number]   ??   INDICATION: Adenocarcinoma right lung stage II follow-up   ??   COMPARISON: Chest x-ray 06/21/2020, chest CT 02/29/2020, PET CT 04/02/2020   ??   TECHNIQUE: Multiple contiguous axial CT images of the chest were obtained from   the lung apices to the lung bases after without intravenous contrast.     ??   Radiation dose reduction techniques were used for this study:  Our CT scanners   use one or all of the following: Automated exposure control, adjustment of the   mA and/or kVp according to patient's size, iterative reconstruction.   ??   FINDINGS:   ??   THORACIC AORTA: Scattered atherosclerosis.   ??   PROXIMAL GREAT VESSELS: Scattered atherosclerosis. Left central venous access   port with catheter tip in the superior vena cava.   ??   HEART: Multivessel coronary artery atherosclerosis.   ??   PERICARDIUM: Unremarkable   ??   MEDIASTINAL LYMPH NODES: Unremarkable   HILAR LYMPH NODES: Unremarkable   AXILLARY LYMPH NODES: Unremarkable   ??   PULMONARY PARENCHYMA: Surgical changes status post right lower lobectomy. Smooth   pleural thickening at the operative site is likely treatment related.   ??   A 0.2 cm right upper lobe nodule is stable (axial image 17) There are no new   suspicious pulmonary nodules or masses.   ??   The trachea and mainstem bronchi are patent.   ??   PLEURA: No pneumothorax. No pleural effusion.   ??   VISUALIZED UPPER ABDOMEN: There is no free intraperitoneal gas in the included   portions of the upper abdomen.  New 0.4 x 1.1 cm left adrenal nodule (axial   image 58).   ??   OSSEOUS STRUCTURES: Thoracic spine  spondylosis without suspicious lytic or   blastic bony lesions.   ??   IMPRESSION   ??   1.  Interval right lower lobectomy. Treatment related changes in the adjacent   pleura. No definite evidence of residual or locally recurrent disease.   ??   2.  No new suspicious pulmonary nodules or masses. No thoracic lymphadenopathy   within the limits of noncontrast technique.   ??   3.  New 1.4 x 1.1 cm left adrenal nodule. This may be an adrenal metastasis. PET   CT may be beneficial for further evaluation.         EXAM: PET/CT TUMOR IMAGE SKULL THIGH W (INI)   ??   INDICATION: highly suspicious for lung cancer.   ??   PROCEDURE: Following IV injection of 13.91 mCi 18 Fluoro 2 deoxyglucose (FDG)   and a  standard uptake delay, PET imaging is performed from head to thighs and   axial, sagittal and coronal images were acquired. Unenhanced CT is obtained for   anatomic localization, and attenuation correction of the PET scan. Patient   preprocedure blood glucose level: 1:15mg /dL.   ??   CORRELATIVE IMAGING STUDIES: CT chest dated 02/29/2020.   ??   COMPARISON PET: No prior PET/CT   ??   HISTORY: The study is requested for initial treatment strategy. .   ??   FINDINGS:   ??   HEAD/NECK:    Mildly hypermetabolic nodule within the superficial left parotid gland, favored   to represent a lymph node. No other hypermetabolic cervical lymphadenopathy.   Cerebral evaluation is limited by normal intense activity.   ??   CHEST:    Redemonstrated 6 cm pulmonary mass within the right lower lobe which   demonstrates peripheral nodular hypermetabolic activity (max SUV 8.9).   Additional nodular area inferiorly along the right posterior pleura which   demonstrates increased metabolic activity (max SUV 8.9). No hypermetabolic hilar   or mediastinal lymphadenopathy. Scattered calcified atherosclerotic disease   ??   ABDOMEN/PELVIS:    No foci of abnormal hypermetabolism. Suspected physiologic uptake within the   gastrointestinal and genitourinary tracts.   ??   SKELETON:    No foci of abnormal hypermetabolism in the axial and visualized appendicular   skeleton.   ??   IMPRESSION   1.  The 6 cm right lower lobe pulmonary mass demonstrates peripheral nodular   hypermetabolic activity, concerning for primary lung neoplasm with central   necrosis.   2.  No hypermetabolic lymphadenopathy or distant metastatic disease.   3.  Mildly enlarged and metabolic left parotid lymph node, likely reactive in   Etiology.         Pathology:   * * *DIAGNOSIS* * * * * * * * * * * * * * * * * * * * * * * * * * * * * * * ??     ???? ?? A: ??"CHEST WALL MASS CONTENTS":   ???? ?? NECROTIC DEBRIS  WITH OUT IDENTIFIABLE VIABLE MALIGNANT TUMOR   ???? ??   B: ??"NUMBER 8 LYMPH NODE  X2":   ???? ?? ANTHRACOTIC LYMPH NODE NEGATIVE FOR METASTATIC TUMOR   ???? ??   C: ??"NUMBER 7 LYMPH NODE X4":   ????  ?? FOUR FRAGMENTS OF ANTHRACOTIC LYMPH NODE NEGATIVE FOR METASTATIC   TUMOR   ???? ??   D: ??"INTERLOBAR LYMPH NODE":   ???? ?? FOUR FRAGMENTS OF ANTHRACOTIC LYMPH NODE NEGATIVE FOR METASTATIC  TUMOR   ????  ??   E: ??"RIGHT LOWER LOBE OF LUNG":   ???? ?? POORLY DIFFERENTIATED MALIGNANT TUMOR CONSISTENT WITH POORLY   DIFFERENTIATED ADENOCARCINOMA MEASURING AP PROXIMALLY ??6.4 X 5.2 X 4.6 CM   WITH EXTENSIVE NECROSIS. BRONCHIAL  AND VASCULAR MARGINS OF RESECTION ARE   NEGATIVE FOR MALIGNANT TUMOR. FOUR ANTHRACOTIC PERIBRONCHIAL LYMPH NODES   NEGATIVE FOR METASTATIC TUMOR. SPECIMEN MARKED "CHEST WALL MASS" INVOLVED   BY POORLY DIFFERENTIATED MALIGNANT TUMOR SIMILAR  TO THAT WITHIN LUNG. SEE   COMMENT          * * *DIAGNOSIS* * * * * * * * * * * * * * * * * * * * * * * * * * * * * * * ??     ???? ?? "RIGHT LUNG MASS": ??IMMUNOHISTOCHEMICAL FINDINGS MOST CONSISTENT  WITH   ADENOCARCINOMA OF LUNG ORIGIN.           tls/04/17/2020     * * *Electronically Signed Out* * * ?? ?? ??   Sign Out Date: 04/18/2020 ??Charles A. Renella Cunas, MD           * * *PROCEDURES/ADDENDA*  * * * * * * * * * * * * * * * * * * * * ??* * * * ??     ???? ?? ?? ?? ?? ?? ?? ?? ??STF-IMMUNOHISTOCHEMISTRY     ???? ?? ?? ?? ?? ?? ?? ?? ?? ?? ?? ?? ?? ?? ?? ?? ?? ?? ??  ?? ?? ?? ?? ?? ?? ?? ?? ?? ?? ?? ?? ?? ?? ?? ?? ?? ??   ??Status: ??Reviewed By:   ??Charles A. Lorel Monaco, III, MD on 04/18/2020   ???? ??   ???? ?? ?? ?? ?? ?? ?? ?? ??  ?? ?? Interpretation   ???? ?? ?? ?? ?? ?? ?? ?? ?? ??Immunohistochemical Stain Panel:     ???? ?? Interpretation: ??Immunohistochemical findings most consistent with   adenocarcinoma of lung origin.      Antibody/Test ?? ?? ?? ?? ?? ?? ?? Marker For ?? ?? ?? ?? ?? ?? ?? ?? ?? ?? ?? ?? ?? ?? ??Result   CK 5/6 ?? ?? ?? ?? ?? ?? ?? ?? ?? ??Squamous cell carcinomas and mesotheliomas ??  ?? ??   ???? ??Negative   p40 ?? ?? ?? ?? ?? ?? ?? ?? ?? ??Squamous differentiation ?? ?? ?? ?? ?? ?? ?? ?? ?? ??   Negative   TTF-1 ?? ?? ?? ?? ?? ?? ?? ?? ?? ?? Lung  and thyroid adenocarcinoma ?? ??  ?? ?? ?? ?? ??   Positive   Napsin ?? ?? ?? ?? ?? ?? ?? ?? ?? ?? Lung adenocarcinoma ?? ?? ?? ?? ?? ?? ?? ?? ?? ?? ?? ??   Negative ??                ASSESSMENT:             ICD-10-CM  ICD-9-CM             1.  Primary adenocarcinoma of right lung (HCC)   C34.91  162.9  CBC WITH AUTOMATED DIFF                MAGNESIUM           METABOLIC PANEL, COMPREHENSIVE           CT CHEST ABD PELV W CONT  2.  Adrenal nodule (HCC)   E27.8  255.8             3.  Dyspnea, unspecified type   R06.00  786.09             Problem List   Date Reviewed:  11-25-2020                        Codes  Class  Noted             Adenocarcinoma of right lung, stage 2 ([place])  ICD-10-CM: C34.91   ICD-9-CM: 162.9    06/12/2020                       Side effect of medication  ICD-10-CM: T88.7XXA   ICD-9-CM: 995.20    06/12/2020                       Large cell carcinoma of lung, right ([place])  ICD-10-CM: C34.91   ICD-9-CM: 162.9    05/07/2020                       Bronchogenic cancer of right lung ([place])  ICD-10-CM: C34.91   ICD-9-CM: 162.9    04/28/2020                       Right lower lobe lung mass  ICD-10-CM: R91.8   ICD-9-CM: 786.6    03/20/2020                       Dry skin dermatitis  ICD-10-CM: L85.3   ICD-9-CM: 692.89    01/03/2020                       Bilateral leg cramps  ICD-10-CM: R25.2   ICD-9-CM: 729.82    01/03/2020                       Elevated blood sugar  ICD-10-CM: R73.9   ICD-9-CM: 790.29    01/03/2020                       Hyperlipidemia  ICD-10-CM: E78.5   ICD-9-CM: 272.4    01/04/2019                       Chronic obstructive pulmonary disease ([place])  ICD-10-CM: J44.9   ICD-9-CM: 578    01/04/2019                       Pre-ulcerative corn or callous  ICD-10-CM: L84   ICD-9-CM: 700    06/25/2018                       Chronic pain of left knee  ICD-10-CM: M25.562, G89.29   ICD-9-CM: 719.46, 338.29    01/26/2018          Overview Signed 02/29/2020 12:10 PM by Selinda Orion, NP            Last Assessment & Plan:    Formatting of this note might be different from  the original.   Discussed in detail today.  We will see if we can get Visco therapy approved.  Referral to PT.  Prediabetes  ICD-10-CM: R73.03   ICD-9-CM: 790.29    06/10/2016                       Chronic right-sided low back pain without sciatica  ICD-10-CM: M54.50, G89.29   ICD-9-CM: 724.2, 338.29    02/12/2016                       Environmental allergies  ICD-10-CM: Z91.09   ICD-9-CM: V15.09    10/05/2014                       Rheumatoid arthritis involving multiple sites with positive rheumatoid factor (HCC)  ICD-10-CM: M05.79   ICD-9-CM: 714.0    01/30/2014          Overview Signed 02/29/2020 12:10 PM by Selinda Orion, NP            Last Assessment & Plan:    Formatting of this note might be different from the original.   Chronic/Stable. Denies any recent RA flares. Patient is taking MTX and folic acid. Denies any adverse side effects. Continue with current treatment plan. Reviewed previous labs. Ordered labs today. Will continue to monitor over time.      Instructed patient to contact our office with any new or worsening symptoms.                                   Hypothyroidism  ICD-10-CM: E03.9   ICD-9-CM: 244.9    01/30/2014                       Vitamin D deficiency  ICD-10-CM: E55.9   ICD-9-CM: 268.9    01/30/2014          Overview Signed 02/29/2020 12:10 PM by Selinda Orion, NP            Last Assessment & Plan:    Formatting of this note might be different from the original.   Encouraged appropriate vitamin D supplementation. Monitor for signs or symptoms of hypercalcemia.                                   Osteoporosis  ICD-10-CM: M81.0   ICD-9-CM: 733.00    01/30/2014                       Gastroesophageal reflux disease without esophagitis  ICD-10-CM: K21.9   ICD-9-CM: 530.81    01/30/2014                       HTN (hypertension)  ICD-10-CM: I10   ICD-9-CM: 401.9    01/30/2014                             PLAN:   Imaging studies were personally reviewed.      Lung cancer:  adenocarcinoma, 6 cm in right lower lobe adjacent to diaphragm with possible focal pleural involvement.  PET/CT shows no distant disease.  Clinically T3N0M0.  She completed VATS with left lower lobectomy as well as chest wall mass resection  and lymphadenectomy on 05/07/20.  She tolerated this reasonably well, still with some post-op tenderness but nothing unmanageable.  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  13 lymph node fragments were all negative for tumor.  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  We do also have the ALCHEMIST trial available, but unfortunately she is not a candidate due to her history of RA with MTX use.  She completed 4 cycles of cisplatin and Alimta with adequate tolerance.  CT  chest at the conclusion of therapy reviewed and shows nothing of suspicion in the lung.  However, she does have a 1.4 cm adrenal nodule which was not obviously present on pretherapy imaging.  We will check PET/CT to assess and then return for review  of imaging, if suspicious she may need biopsy.      Here for follow-up.  No changes, she remains fatigued with some baseline shortness of breath since completing chemotherapy.  No other new symptoms, ECOG 1.  PET/CT personally reviewed and shows no significant uptake in the left adrenal or other areas  of concern.  We will continue observation with repeat CT in 3 months.  She is comfortable with the plan.  All questions were asked and answered to the best of my ability.                   Greer Ee, MD    [facility] [place] [address]   Office : [phone]   Fax : [phone]

## 2020-11-09 NOTE — Progress Notes (Signed)
11/09/2020 - Pt seen by Dr. Jenetta Loges for follow-up. Reviewed CT scan. Scan looks good. Will have another CT in 3 months, then f/u with MD/labs. Denies pain. No other issues addressed. Encouraged to call with questions. Navigation will continue to follow.

## 2020-11-10 ENCOUNTER — Encounter

## 2020-12-12 ENCOUNTER — Ambulatory Visit: Admit: 2020-12-12 | Discharge: 2020-12-12 | Payer: MEDICARE | Attending: Internal Medicine | Primary: Internal Medicine

## 2020-12-12 ENCOUNTER — Inpatient Hospital Stay: Admit: 2020-12-12 | Payer: MEDICARE | Primary: Internal Medicine

## 2020-12-12 ENCOUNTER — Encounter

## 2020-12-12 ENCOUNTER — Encounter: Attending: Internal Medicine | Primary: Internal Medicine

## 2020-12-12 DIAGNOSIS — M0579 Rheumatoid arthritis with rheumatoid factor of multiple sites without organ or systems involvement: Secondary | ICD-10-CM

## 2020-12-12 DIAGNOSIS — R1031 Right lower quadrant pain: Secondary | ICD-10-CM

## 2020-12-12 MED ORDER — METOPROLOL SUCCINATE ER 50 MG PO TB24
50 MG | ORAL_TABLET | Freq: Every day | ORAL | 2 refills | Status: DC
Start: 2020-12-12 — End: 2021-08-14

## 2020-12-12 MED ORDER — LOSARTAN POTASSIUM 100 MG PO TABS
100 MG | ORAL_TABLET | Freq: Every day | ORAL | 2 refills | Status: AC
Start: 2020-12-12 — End: 2021-08-19

## 2020-12-12 NOTE — Progress Notes (Signed)
HPI: Audrey Snow (DOB: 1940/08/14)    RLQ abdominal wall prominence and protrusion since having surgery for lung cancer    Has completed her chemo  Has a CT scan scheduled for Aug of abdomen    Need to update labs today    Has had chemo brain with memory loss      Problem List:  Patient Active Problem List   Diagnosis   ??? Osteoporosis   ??? Rheumatoid arthritis involving multiple sites with positive rheumatoid factor ([place])   ??? Dry skin dermatitis   ??? Side effect of medication   ??? Hyperlipidemia   ??? Chronic right-sided low back pain without sciatica   ??? Pre-ulcerative corn or callous   ??? Right lower lobe lung mass   ??? Vitamin D deficiency   ??? Prediabetes   ??? Environmental allergies   ??? HTN (hypertension)   ??? Bilateral leg cramps   ??? Gastroesophageal reflux disease without esophagitis   ??? Large cell carcinoma of lung, right ([place])   ??? Adenocarcinoma of right lung, stage 2 ([place])   ??? Bronchogenic cancer of right lung ([place])   ??? Chronic pain of left knee   ??? Elevated blood sugar   ??? Chronic obstructive pulmonary disease ([place])   ??? Hypothyroidism   ??? Chronic renal disease, stage III ([place]) [[id number]]       History:  Past Medical History:   Diagnosis Date   ??? Arrhythmia 04/05/2020    PVCs.  04/05/20 was referred to [facility] for tachycardia, was seen 05/01/20 [facility]   ??? 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 Autoimmune disease ([place])     RA   ??? Bronchogenic cancer of right lung ([place]) 04/28/2020    adenocarcinoma   ??? Chronic obstructive pulmonary disease ([facility])     uses inhalers   ??? Chronic pain of left knee 06/25/2018   ??? Coronary atherosclerosis     noted on chest CT   ??? COVID-19 vaccine series completed 08/15/2019    Pf[employer]; 07/29/2019 and booster given 03/12/2020   ??? GERD (gastroesophageal reflux disease)     no meds at this time   ??? H/O cardiovascular stress test     Bruce protocol, low risk study, EF of 67% and normal LV function   ??? H/O  mammogram 07/28/2016    mammogram and Ultrasound of right breaset- benign finding- [facility]    ??? Hearing loss     "only hears out of left ear"   ??? Hearing loss of both ears     no hearing aids    ??? HTN (hypertension), benign 01/30/2014    controlled w/med   ??? Hypothyroid     on med for control   ??? Menopause    ??? Osteoarthritis    ??? Osteoporosis     prolia    ??? Poor historian     patient had trouble remembering names of meds and what they were for and had to repeat instructions for surgery several times    ??? 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 Debbra Riding. Methotrexate Rx   ??? Sinusitis     sinus flonase    ??? 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 Nancie Neas   ??? Unspecified hypothyroidism 01/30/2014    thyroid medication   ??? Vitamin D deficiency     medications for this  Allergies:  Allergies   Allergen Reactions   ??? Yeast Nausea And Vomiting     Pt. Says she hasnt had issues with this in a long time       Current Medications:  Current Outpatient Medications   Medication Sig Dispense Refill   ??? losartan (COZAAR) 100 MG tablet Take 1 tablet by mouth daily TAKE 1 TABLET BY MOUTH DAILY 90 tablet 2   ??? metoprolol succinate (TOPROL XL) 50 MG extended release tablet Take 1 tablet by mouth daily 90 tablet 2   ??? albuterol sulfate HFA 108 (90 Base) MCG/ACT inhaler Inhale 1 puff into the lungs every 6 hours as needed     ??? atorvastatin (LIPITOR) 20 MG tablet Take 20 mg by mouth daily     ??? denosumab (PROLIA) 60 MG/ML SOSY SC injection Inject 60 mg into the skin     ??? ergocalciferol (ERGOCALCIFEROL) 1.25 MG (50000 UT) capsule Take 50,000 Units by mouth every 14 days     ??? fluticasone (FLONASE) 50 MCG/ACT nasal spray 2 sprays by Nasal route daily     ??? folic acid (FOLVITE) 1 MG tablet Take 1 mg by mouth daily     ??? gabapentin (NEURONTIN) 100 MG capsule Take 100 mg by mouth 3 times daily as needed.     ??? levothyroxine (SYNTHROID) 100 MCG tablet Take 100 mcg by mouth every morning (before breakfast)     ???  lidocaine-prilocaine (EMLA) 2.5-2.5 % cream Apply topically as needed     ??? niacin 500 MG tablet Take 500 mg by mouth every morning (before breakfast)     ??? ondansetron (ZOFRAN) 8 MG tablet Take 8 mg by mouth every 8 hours as needed     ??? prochlorperazine (COMPAZINE) 10 MG tablet Take 10 mg by mouth every 6 hours as needed     ??? umeclidinium-vilanterol (ANORO ELLIPTA) 62.5-25 MCG/INH AEPB inhaler Inhale 1 puff into the lungs daily       No current facility-administered medications for this visit.       Review of Systems:  Review of Systems   Constitutional:        Weight loss   HENT: Positive for hearing loss.    Respiratory: Positive for shortness of breath.    Cardiovascular: Negative for chest pain.   Musculoskeletal: Positive for arthralgias.   All other systems reviewed and are negative.      Vitals:  Ht 5\' 10"  (1.778 m)    Wt 142 lb (64.4 kg)    BMI 20.37 kg/m??     Physical Exam:  Physical Exam  Vitals reviewed.   Constitutional:       Appearance: Normal appearance.   HENT:      Head: Normocephalic and atraumatic.      Comments: HOH  Cardiovascular:      Rate and Rhythm: Normal rate and regular rhythm.      Heart sounds: Normal heart sounds.   Pulmonary:      Effort: Pulmonary effort is normal.      Breath sounds: Normal breath sounds.   Abdominal:      General: Bowel sounds are normal. There is distension.      Palpations: Abdomen is soft.      Comments: Abdominal wall hernia and prominence of colon on right vs left    Musculoskeletal:         General: Normal range of motion.      Cervical back: Normal range of motion and neck supple.  Skin:     General: Skin is warm and dry.   Neurological:      General: No focal deficit present.      Mental Status: She is alert and oriented to person, place, and time.   Psychiatric:         Mood and Affect: Mood normal.         Behavior: Behavior normal.         Thought Content: Thought content normal.         Judgment: Judgment normal.          Assessment/Plan:   Magaly Pollina was seen today for follow-up.    Diagnoses and all orders for this visit:    Rheumatoid arthritis with rheumatoid factor of multiple sites without organ or systems involvement ([place])  Continue to see Rheum  Primary hypertension  BP controlled  Acquired hypothyroidism  -     TSH; Future  With wt loss need to update labs  Mixed hyperlipidemia  -     Lipid Panel; Future  Update labs on Lipitor  Elevated blood sugar  -     Hemoglobin A1C; Future  Probably in normal range due to weight loss  Stage 3b chronic kidney disease ([place])  monitor  Adenocarcinoma of right lung, stage 2 ([facility])  Has completed chemo and has lost weight   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        [name], MD

## 2020-12-13 LAB — TSH: TSH, 3RD GENERATION: 7.23 u[IU]/mL — ABNORMAL HIGH (ref 0.358–3.740)

## 2020-12-13 LAB — LIPID PANEL
Chol/HDL Ratio: 2.8
Cholesterol, Total: 183 MG/DL (ref <200)
HDL: 65 MG/DL — ABNORMAL HIGH (ref 40–60)
LDL Calculated: 96.4 MG/DL (ref <100)
Triglycerides: 108 MG/DL (ref 35–150)
VLDL Cholesterol Calculated: 21.6 MG/DL (ref 6.0–23.0)

## 2020-12-13 LAB — HEMOGLOBIN A1C
Hemoglobin A1C: 5.5 % (ref 4.20–6.30)
eAG: 111 mg/dL

## 2020-12-26 ENCOUNTER — Ambulatory Visit: Admit: 2020-12-26 | Discharge: 2020-12-26 | Payer: MEDICARE | Attending: Nurse Practitioner | Primary: Internal Medicine

## 2020-12-26 ENCOUNTER — Encounter: Attending: Nurse Practitioner | Primary: Internal Medicine

## 2020-12-26 DIAGNOSIS — J449 Chronic obstructive pulmonary disease, unspecified: Secondary | ICD-10-CM

## 2020-12-26 MED ORDER — ANORO ELLIPTA 62.5-25 MCG/INH IN AEPB
Freq: Every day | RESPIRATORY_TRACT | 11 refills | Status: DC
Start: 2020-12-26 — End: 2021-06-26

## 2020-12-26 NOTE — Progress Notes (Signed)
[address]  [place] [address]  [phone]        Name:  Audrey Snow  Date of Birth:  03/19/1941  MRN:  [id number]    Office visit  12/26/2020      Chief Complaint   Patient presents with   ??? COPD           HISTORY OF PRESENT ILLNESS:  The patient is an 80 year old female who is seen??for follow up of COPD and??6??cm RLL mass. ??The patient has has a history of vitamin D deficiency, hypothyroidism,  RA (Dr. Alexandria Lodge [facility]), osteoporosis, HTN, extreme hearing loss, GERD, and COPD. ??She is accompanied by her husband who assists with the history due to her severe hearing loss.  She has previously been followed by [facility] pulmonary for COPD. ??She  has a 21 pack year history of cigarette smoking, but quit in 1985. 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 Trina Ao. ??She had a near syncopal episode in July which led to a CT of  chest which demonstrated an incidental finding of a 5 cm RLL mass. ??She has seen cardiology at [facility] and had Holter monitor and stress test (poor exercise capacity). ??Denies any fever, chills, or night sweats. ??Weight has been stable. ??No  hemoptysis. ??   ??   Had IR biopsy of right lung mass which revealed adenocarcinoma. ??Was presented at tumor board--recommendation was for surgical resection and adjuvant chemotherapy. ??Had right VATS and RLLobectomy, mediastinal lymphadenctomy, and cryoablation of intercostal  nerves on 05/07/20 by Dr. Sheria Lang.  Had port placed 07/04/20. ??Has seen Dr. Jenetta Loges.     Stopped her Anoro and hasn't been wearing her O2.  Complains of worsening DOE.  No cough or wheezing.      ASSESSMENT/PLAN:  (Medical Decision Making)  Below is the assessment and plan developed based on review of pertinent history, physical exam, labs, studies, and medications.      Encounter Diagnoses   Name Primary?   ??? COPD, moderate ([place])  --resume Anoro  -The patient is educated about the chronic nature of COPD  and the importance of maintenance medications.  Discussed that a substantial portion of patients who stop their medications eventually develop exacerbations. We discussed the concept of treating for symptoms and also treating for long-term prevention of end organ damage.       Yes   ??? Hypoxemia  --exertional and nocturnal--resume O2 at 2 lpm      ??? Large cell carcinoma of lung, right ([place])  --followed by oncology.      ??? Personal history of noncompliance with medical treatment  --see above.            No orders of the defined types were placed in this encounter.     Orders Placed This Encounter   Medications   ??? umeclidinium-vilanterol (ANORO ELLIPTA) 62.5-25 MCG/INH AEPB inhaler     Sig: Inhale 1 puff into the lungs daily     Dispense:  1 each     Refill:  11      Follow up with me in 6 months with spirometry.       Georgiana Shore, NP, APRN - CNP  Electronically signed          ~~~~~~~~~~~~~~~~~~~~~~~~~~~~~~~~~~~~~~~~  REFERENCE INFORMATION    Past Medical History:   Diagnosis Date   ??? Arrhythmia 04/05/2020    PVCs.  04/05/20 was referred to [facility] for tachycardia, was seen 05/01/20 [facility]   ??? 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 Autoimmune disease ([place])     RA   ??? Bronchogenic cancer of right lung ([place]) 04/28/2020    adenocarcinoma   ??? Chronic obstructive pulmonary disease (HCC)     uses inhalers   ??? Chronic pain of left knee 06/25/2018   ??? Coronary atherosclerosis     noted on chest CT   ??? COVID-19 vaccine series completed 08/15/2019    Pfizer; 07/29/2019 and booster given 03/12/2020   ??? GERD (gastroesophageal reflux disease)     no meds at this time   ??? H/O cardiovascular stress test     Bruce protocol, low risk study, EF of 67% and normal LV function   ??? H/O mammogram 07/28/2016    mammogram and Ultrasound of right breaset- benign finding- [facility]    ??? Hearing loss     "only hears out of left ear"   ??? Hearing loss of both ears      no hearing aids    ??? HTN (hypertension), benign 01/30/2014    controlled w/med   ??? Hypothyroid     on med for control   ??? Menopause    ??? Osteoarthritis    ??? Osteoporosis     prolia    ??? Poor historian     patient had trouble remembering names of meds and what they were for and had to repeat instructions for surgery several times    ??? 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 Debbra Riding. Methotrexate Rx   ??? Sinusitis     sinus flonase    ??? Thymoma, malignant ([place]) 2014    B type Dr. Nancie Neas   ??? Unspecified hypothyroidism 01/30/2014    thyroid medication   ??? Vitamin D deficiency     medications for this        Past Surgical History:   Procedure Laterality Date   ??? CARPAL TUNNEL RELEASE Bilateral    ??? COLONOSCOPY      colon polyps-adenomatous, has seen Dr. Kristopher Glee in past   ??? COLONOSCOPY N/A 01/27/2018    COLONOSCOPY/BMI 26 performed by Azalia Bilis, MD at [place]   ??? COLONOSCOPY FLX DX W/COLLJ [facility] WHEN PFRMD  01/27/2018        ??? CT NEEDLE BIOPSY LUNG PERCUTANEOUS  04/16/2020    CT NEEDLE BIOPSY LUNG PERCUTANEOUS 04/16/2020 [facility] CT SCAN   ??? HEMORRHOID SURGERY     ??? OTHER SURGICAL HISTORY  09/2012    sinus surgery-Dr. Mickle Mallory   ??? OTHER SURGICAL HISTORY  01/27/2013    Thymoma-Dr. Nancie Neas   ??? TONSILLECTOMY  80 yrs old   ??? UPPER GASTROINTESTINAL ENDOSCOPY  2015    Gastric AVM txed with cautery-Dr. Orpah Melter       Family History   Problem Relation Age of Onset   ??? Osteoarthritis Mother    ??? Breast Cancer Neg Hx    ??? Alzheimer's Disease Father        Social History     Socioeconomic History   ??? Marital status: Married     Spouse name: Not on file   ??? Number of children: Not on file   ??? Years of education: Not on file   ??? Highest education level: Not on file   Occupational History   ??? Not on file  Tobacco Use   ??? Smoking status: Former Smoker     Packs/day: 0.50     Start date: 07/08/1959     Quit date: 07/08/1983     Years since quitting: 37.4   ??? Smokeless tobacco: Never Used   ??? Tobacco comment: Quit  smoking: not regular when first started- 1pk/week   Substance and Sexual Activity   ??? Alcohol use: No     Alcohol/week: 0.0 standard drinks   ??? Drug use: No   ??? Sexual activity: Not on file   Other Topics Concern   ??? Not on file   Social History Narrative    Married and lives with husband.  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     Social Determinants of Health     Financial Resource Strain:    ??? Difficulty of Paying Living Expenses: Not on file   Food Insecurity:    ??? Worried About Charity fundraiser in the Last Year: Not on file   ??? Ran Out of Food in the Last Year: Not on file   Transportation Needs:    ??? Lack of Transportation (Medical): Not on file   ??? Lack of Transportation (Non-Medical): Not on file   Physical Activity:    ??? Days of Exercise per Week: Not on file   ??? Minutes of Exercise per Session: Not on file   Stress:    ??? Feeling of Stress : Not on file   Social Connections:    ??? Frequency of Communication with Friends and Family: Not on file   ??? Frequency of Social Gatherings with Friends and Family: Not on file   ??? Attends Religious Services: Not on file   ??? Active Member of Clubs or Organizations: Not on file   ??? Attends Archivist Meetings: Not on file   ??? Marital Status: Not on file   Intimate Partner Violence:    ??? Fear of Current or Ex-Partner: Not on file   ??? Emotionally Abused: Not on file   ??? Physically Abused: Not on file   ??? Sexually Abused: Not on file   Housing Stability:    ??? Unable to Pay for Housing in the Last Year: Not on file   ??? Number of Places Lived in the Last Year: Not on file   ??? Unstable Housing in the Last Year: Not on file       Patient Active Problem List   Diagnosis   ??? Osteoporosis   ??? Rheumatoid arthritis with rheumatoid factor of multiple sites without organ or systems involvement ([place])   ??? Dry skin dermatitis   ??? Side effect of medication   ??? Hyperlipidemia   ??? Chronic right-sided low back pain without sciatica   ??? Pre-ulcerative corn or callous   ??? Right lower  lobe lung mass   ??? Vitamin D deficiency   ??? Prediabetes   ??? Environmental allergies   ??? HTN (hypertension)   ??? Bilateral leg cramps   ??? Gastroesophageal reflux disease without esophagitis   ??? Large cell carcinoma of lung, right ([place])   ??? Adenocarcinoma of right lung, stage 2 ([place])   ??? Bronchogenic cancer of right lung ([place])   ??? Chronic pain of left knee   ??? Elevated blood sugar   ??? Chronic obstructive pulmonary disease ([place])   ??? Hypothyroidism   ??? Chronic renal disease, stage III ([place]) [147829]   ??? Short-term memory loss  Allergies   Allergen Reactions   ??? Yeast Nausea And Vomiting     Pt. Says she hasnt had issues with this in a long time       Current Outpatient Medications   Medication Sig   ??? umeclidinium-vilanterol (ANORO ELLIPTA) 62.5-25 MCG/INH AEPB inhaler Inhale 1 puff into the lungs daily   ??? losartan (COZAAR) 100 MG tablet Take 1 tablet by mouth daily TAKE 1 TABLET BY MOUTH DAILY   ??? albuterol sulfate HFA 108 (90 Base) MCG/ACT inhaler Inhale 1 puff into the lungs every 6 hours as needed   ??? atorvastatin (LIPITOR) 20 MG tablet Take 20 mg by mouth daily   ??? denosumab (PROLIA) 60 MG/ML SOSY SC injection Inject 60 mg into the skin   ??? ergocalciferol (ERGOCALCIFEROL) 1.25 MG (50000 UT) capsule Take 50,000 Units by mouth every 14 days   ??? fluticasone (FLONASE) 50 MCG/ACT nasal spray 2 sprays by Nasal route daily   ??? folic acid (FOLVITE) 1 MG tablet Take 1 mg by mouth daily   ??? gabapentin (NEURONTIN) 100 MG capsule Take 100 mg by mouth 3 times daily as needed.   ??? levothyroxine (SYNTHROID) 100 MCG tablet Take 100 mcg by mouth every morning (before breakfast)   ??? niacin 500 MG tablet Take 500 mg by mouth every morning (before breakfast)   ??? ondansetron (ZOFRAN) 8 MG tablet Take 8 mg by mouth every 8 hours as needed   ??? prochlorperazine (COMPAZINE) 10 MG tablet Take 10 mg by mouth every 6 hours as needed   ??? metoprolol succinate (TOPROL XL) 50 MG extended release tablet Take 1 tablet by mouth daily  (Patient not taking: Reported on 12/26/2020)   ??? lidocaine-prilocaine (EMLA) 2.5-2.5 % cream Apply topically as needed (Patient not taking: Reported on 12/26/2020)     No current facility-administered medications for this visit.       Review of Systems   Constitutional: Negative for chills, diaphoresis, fatigue and fever.   Cardiovascular: Negative for chest pain, palpitations and leg swelling.   Gastrointestinal: Negative for abdominal pain, constipation, diarrhea, nausea and vomiting.   Neurological: Negative for dizziness, tremors, seizures, syncope, weakness and headaches.                PHYSICAL EXAM:    Vitals:    12/26/20 1302   BP: 118/76   Site: Left Upper Arm   Position: Sitting   Cuff Size: Medium Adult   Pulse: 54   Resp: 18   Temp: 97.4 ??F (36.3 ??C)   TempSrc: Skin  Comment: wrist   SpO2: 96%   Weight: 139 lb 14.4 oz (63.5 kg)   Height: 5\' 7"  (1.702 m)      Body mass index is 21.91 kg/m??.        GENERAL APPEARANCE:  The patient is normal weight and in no respiratory distress.  HEENT:  PERRL.  Conjunctivae unremarkable.  Nasal mucosa is without epistaxis, exudate, or polyps.  Gums and dentition are unremarkable.  There is no oropharyngeal narrowing.  TMs are clear.  NECK/LYMPHATIC:  Symmetrical with no elevation of jugular venous pulsation.  Trachea midline. No thyroid enlargement.  No cervical adenopathy.  LUNGS:  Normal respiratory effort with symmetrical lung expansion.   Breath sounds decreased bilaterally, but clear.  HEART:  There is a regular rate and rhythm.  No murmur, rub, or gallop.  There is no edema in the lower extremities.  ABDOMEN:  Soft and non-tender.  No hepatosplenomegaly.  Bowel sounds  are normal.    NEURO:  The patient is alert and oriented to person, place, and time.  Memory appears intact and mood is normal.  No gross sensorimotor deficits are present.      DIAGNOSTIC TESTS:   Imaging:   CT CHEST WO CONTRAST 10/22/2020    Narrative  EXAMINATION: CT CHEST WITHOUT INTRAVENOUS CONTRAST  10/22/2020 8:09 AM    ACCESSION NUMBER: [id number]    INDICATION: Adenocarcinoma right lung stage II follow-up    COMPARISON: Chest x-ray 06/21/2020, chest CT 02/29/2020, PET CT 04/02/2020    TECHNIQUE: Multiple contiguous axial CT images of the chest were obtained from  the lung apices to the lung bases after without intravenous contrast.    Radiation dose reduction techniques were used for this study:  Our CT scanners  use one or all of the following: Automated exposure control, adjustment of the  mA and/or kVp according to patient's size, iterative reconstruction.    FINDINGS:    THORACIC AORTA: Scattered atherosclerosis.    PROXIMAL GREAT VESSELS: Scattered atherosclerosis. Left central venous access  port with catheter tip in the superior vena cava.    HEART: Multivessel coronary artery atherosclerosis.    PERICARDIUM: Unremarkable    MEDIASTINAL LYMPH NODES: Unremarkable  HILAR LYMPH NODES: Unremarkable  AXILLARY LYMPH NODES: Unremarkable    PULMONARY PARENCHYMA: Surgical changes status post right lower lobectomy. Smooth  pleural thickening at the operative site is likely treatment related.    A 0.2 cm right upper lobe nodule is stable (axial image 17) There are no new  suspicious pulmonary nodules or masses.    The trachea and mainstem bronchi are patent.    PLEURA: No pneumothorax. No pleural effusion.    VISUALIZED UPPER ABDOMEN: There is no free intraperitoneal gas in the included  portions of the upper abdomen.  New 0.4 x 1.1 cm left adrenal nodule (axial  image 58).    OSSEOUS STRUCTURES: Thoracic spine spondylosis without suspicious lytic or  blastic bony lesions.    Impression  1.  Interval right lower lobectomy. Treatment related changes in the adjacent  pleura. No definite evidence of residual or locally recurrent disease.    2.  No new suspicious pulmonary nodules or masses. No thoracic lymphadenopathy  within the limits of noncontrast technique.    3.  New 1.4 x 1.1 cm left adrenal nodule. This may be  an adrenal metastasis. PET  CT may be beneficial for further evaluation.        Spirometry/Complete pulmonary function tests:    04/03/2020---          Exercise oximetry:  O2 sat on room air at rest is 96% and resting heart rate is 54 bpm.  After walking for 3 minutes, O2 sat dropped to 86%.  On O2 2 lpm O2 sat was 94%.     Labs:     Lab Results   Component Value Date    NA 139 11/09/2020    K 3.5 11/09/2020    CL 106 11/09/2020    CO2 28 11/09/2020    BUN 9 11/09/2020    CREATININE 1.30 11/09/2020    GLUCOSE 127 11/09/2020    CALCIUM 8.8 11/09/2020         Lab Results   Component Value Date    WBC 6.3 11/09/2020    HGB 10.9 (L) 11/09/2020    HCT 32.9 (L) 11/09/2020    MCV 104.8 (H) 11/09/2020    PLT 257 11/09/2020  No results found for: BNP     Lab Results   Component Value Date    TSH 20.500 (H) 06/07/2020              Georgiana Shore, NP, APRN - CNP  Electronically signed    Dictated using voice recognition software.  Proof read but unrecognized errors may exist.

## 2021-01-08 NOTE — Telephone Encounter (Signed)
Patient wants is trying to figure out who put the port in her that was used for chemo, she is finished with it and wants the port removed

## 2021-01-10 NOTE — Telephone Encounter (Signed)
Able to convey to spouse & patient Dr. Sheria Lang put port in, provided # for that office and advised to get referral from Dr. Jenetta Loges.

## 2021-01-29 ENCOUNTER — Ambulatory Visit: Admit: 2021-01-29 | Discharge: 2021-01-29 | Payer: MEDICARE | Attending: Surgery | Primary: Internal Medicine

## 2021-01-29 DIAGNOSIS — R918 Other nonspecific abnormal finding of lung field: Secondary | ICD-10-CM

## 2021-01-30 ENCOUNTER — Ambulatory Visit: Payer: MEDICARE | Primary: Internal Medicine

## 2021-01-30 ENCOUNTER — Encounter

## 2021-01-30 NOTE — Progress Notes (Signed)
Procedure: port removal    The port site is prepared with Betadine solution, draped in routine fashion. Then the skin is anesthetized with 1% lidocaine.  The old incision is opened with scalpel, the port is identified and removed intactly. The finger pressure is applied at venous insertion site.  Then the incision is closed with 3-0 Vicryl stitch on dermal layer and a 4-0 Vicryl stitch in subcuticular layer.  Patient tolerated well.

## 2021-01-30 NOTE — Progress Notes (Signed)
[facility]  [address], [address]  [place] [address]  [phone]     01/30/2021  Patient:  Audrey Snow  DOB: 1940/07/30    HPI  Audrey Snow is a 80 y.o. female who is seen for port removal. She has right lung cancer with chest wall invasion or metastasis, she has finished adjuvant chemo and is doing well, she wants port out.       Past Medical History:   Diagnosis Date    Arrhythmia 04/05/2020    PVCs.  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, frequent ventricular ectopy, runs nonsustained VT    Autoimmune disease ([place])     RA    Bronchogenic cancer of right lung ([place]) 04/28/2020    adenocarcinoma    Chronic obstructive pulmonary disease ([place])     uses inhalers    Chronic pain of left knee 06/25/2018    Coronary atherosclerosis     noted on chest CT    COVID-19 vaccine series completed 08/15/2019    Pfizer; 07/29/2019 and booster given 03/12/2020    GERD (gastroesophageal reflux disease)     no meds at this time    H/O cardiovascular stress test     Bruce protocol, low risk study, EF of 67% and normal LV function    H/O mammogram 07/28/2016    mammogram and Ultrasound of right breaset- benign finding- [facility]     Hearing loss     "only hears out of left ear"    Hearing loss of both ears     no hearing aids     HTN (hypertension), benign 01/30/2014    controlled w/med    Hypothyroid     on med for control    Menopause     Osteoarthritis     Osteoporosis     prolia     Poor historian     patient had trouble remembering names of meds and what they were for and had to repeat instructions for surgery several times     Rheumatoid arthritis(714.0) 01/30/2014    followed by Dr. Debbra Riding. Methotrexate Rx    Sinusitis     sinus flonase     Thymoma, malignant ([place]) 2014    B type Dr. Nancie Neas    Unspecified hypothyroidism 01/30/2014    thyroid medication    Vitamin D deficiency     medications  for this      Current Outpatient Medications   Medication Sig Dispense Refill    umeclidinium-vilanterol (ANORO ELLIPTA) 62.5-25 MCG/INH AEPB inhaler Inhale 1 puff into the lungs daily 1 each 11    losartan (COZAAR) 100 MG tablet Take 1 tablet by mouth daily TAKE 1 TABLET BY MOUTH DAILY 90 tablet 2    metoprolol succinate (TOPROL XL) 50 MG extended release tablet Take 1 tablet by mouth daily 90 tablet 2    albuterol sulfate HFA 108 (90 Base) MCG/ACT inhaler Inhale 1 puff into the lungs every 6 hours as needed      denosumab (PROLIA) 60 MG/ML SOSY SC injection Inject 60 mg into the skin      ergocalciferol (ERGOCALCIFEROL) 1.25 MG (50000 UT) capsule Take 50,000 Units by mouth every 14 days      fluticasone (FLONASE) 50 MCG/ACT nasal spray 2 sprays by Nasal route daily      folic acid (FOLVITE) 1 MG  tablet Take 1 mg by mouth daily      gabapentin (NEURONTIN) 100 MG capsule Take 100 mg by mouth 3 times daily as needed.      levothyroxine (SYNTHROID) 100 MCG tablet Take 100 mcg by mouth every morning (before breakfast)      lidocaine-prilocaine (EMLA) 2.5-2.5 % cream Apply topically as needed      niacin 500 MG tablet Take 500 mg by mouth every morning (before breakfast)      ondansetron (ZOFRAN) 8 MG tablet Take 8 mg by mouth every 8 hours as needed      prochlorperazine (COMPAZINE) 10 MG tablet Take 10 mg by mouth every 6 hours as needed      atorvastatin (LIPITOR) 20 MG tablet Take 20 mg by mouth daily       No current facility-administered medications for this visit.     Allergies   Allergen Reactions    Yeast Nausea And Vomiting     Pt. Says she hasnt had issues with this in a long time     Past Surgical History:   Procedure Laterality Date    CARPAL TUNNEL RELEASE Bilateral     COLONOSCOPY      colon polyps-adenomatous, has seen Dr. Kristopher Glee in past    COLONOSCOPY N/A 01/27/2018    COLONOSCOPY/BMI 26 performed by Azalia Bilis, MD at [facility] FLX DX W/COLLJ SPEC WHEN PFRMD  01/27/2018         CT  NEEDLE BIOPSY LUNG PERCUTANEOUS  04/16/2020    CT NEEDLE BIOPSY LUNG PERCUTANEOUS 04/16/2020 [facility] CT SCAN    HEMORRHOID SURGERY      OTHER SURGICAL HISTORY  09/2012    sinus surgery-Dr. Mickle Mallory    OTHER SURGICAL HISTORY  01/27/2013    Thymoma-Dr. Nancie Neas    TONSILLECTOMY  80 yrs old    UPPER GASTROINTESTINAL ENDOSCOPY  2015    Gastric AVM txed with cautery-Dr. Orpah Melter     Family History   Problem Relation Age of Onset    Osteoarthritis Mother     Breast Cancer Neg Hx     Alzheimer's Disease Father      Social History     Tobacco Use    Smoking status: Former     Packs/day: 0.50     Types: Cigarettes     Start date: 07/08/1959     Quit date: 07/08/1983     Years since quitting: 37.5    Smokeless tobacco: Never    Tobacco comments:     Quit smoking: not regular when first started- 1pk/week   Substance Use Topics    Alcohol use: No     Alcohol/week: 0.0 standard drinks        Review of Systems   A comprehensive review of systems was negative except for that written in the HPI.     Physical Exam  Ht 5\' 7"  (1.702 m)    Wt 139 lb (63 kg)    BMI 21.77 kg/m??      General: Alert, oriented, cooperative, awake patient in no acute distress   Skin:  Warm, moist with good texture   Eyes:   Sclera are clear, extraocular muscles intact  HENT:  Normocephalic; oral mucosa moist, nares patent; neck is supple; trachea midline  Respiratory: Lungs clear to auscultation bilaterally, breathing is non-labored   Chest:  Symmetric throughout one respiratory excursion; no supraclavicular lymphadenopathy  CV:  Regular rate and rhythm, no appreciable murmurs, rubs, gallops  Abdomen: Soft, protuberant but non-distended; bowel sounds are normoactive   Extremities: No cyanosis, clubbing or edema  Neurological: No focal signs      Labs:   Lab Results   Component Value Date/Time    APTT 28.9 04/05/2020 11:46 AM    INR 1.0 05/02/2020 12:11 PM        Assessment/Plan:   Audrey Snow is a 80 y.o. female who has signs and symptoms consistent with  right lung cancer, s/p adjuvant chemo. Will remove port per her request.     Thayer Jew, MD

## 2021-02-04 MED ORDER — LEVOTHYROXINE SODIUM 100 MCG PO TABS
100 MCG | ORAL_TABLET | ORAL | 11 refills | Status: AC
Start: 2021-02-04 — End: 2022-02-11

## 2021-02-04 NOTE — Telephone Encounter (Signed)
Requested Prescriptions     Pending Prescriptions Disp Refills    levothyroxine (SYNTHROID) 100 MCG tablet [Pharmacy Med Name: LEVOTHYROXINE SODIUM 100 MC 100 Tablet] 30 tablet 11     Sig: TAKE 1 TABLET BY MOUTH DAILY BEFORE BREAKFAST

## 2021-02-08 ENCOUNTER — Encounter

## 2021-02-08 ENCOUNTER — Inpatient Hospital Stay: Admit: 2021-02-08 | Payer: MEDICARE | Primary: Internal Medicine

## 2021-02-08 ENCOUNTER — Ambulatory Visit: Payer: MEDICARE | Primary: Internal Medicine

## 2021-02-08 DIAGNOSIS — C3491 Malignant neoplasm of unspecified part of right bronchus or lung: Secondary | ICD-10-CM

## 2021-02-08 LAB — POCT CREATININE - BLOOD
Glomerular Filtration Rate, POC: 45 mL/min/{1.73_m2} — ABNORMAL LOW (ref >60)
POC Creatinine: 1.21 mg/dL (ref 0.8–1.5)
POC GFR African American: 55 mL/min/{1.73_m2} — ABNORMAL LOW (ref >60)

## 2021-02-08 MED ORDER — DIATRIZOATE MEGLUMINE & SODIUM 66-10 % PO SOLN
66-10 % | Freq: Once | ORAL | Status: DC | PRN
Start: 2021-02-08 — End: 2021-02-11
  Administered 2021-02-08: 14:00:00 15 mL via ORAL

## 2021-02-08 MED ORDER — IOHEXOL 350 MG/ML IV SOLN
350 MG/ML | Freq: Once | INTRAVENOUS | Status: AC | PRN
Start: 2021-02-08 — End: 2021-02-08
  Administered 2021-02-08: 14:00:00 100 mL via INTRAVENOUS

## 2021-02-08 MED ORDER — SODIUM CHLORIDE 0.9 % IV BOLUS
0.9 % | Freq: Once | INTRAVENOUS | Status: AC
Start: 2021-02-08 — End: 2021-02-08
  Administered 2021-02-08: 14:00:00 100 mL via INTRAVENOUS

## 2021-02-08 MED ORDER — NORMAL SALINE FLUSH 0.9 % IV SOLN
0.9 % | Freq: Once | INTRAVENOUS | Status: AC | PRN
Start: 2021-02-08 — End: 2021-02-08
  Administered 2021-02-08: 14:00:00 10 mL via INTRAVENOUS

## 2021-02-08 MED FILL — OMNIPAQUE 350 MG/ML IV SOLN: 350 mg/mL | INTRAVENOUS | Qty: 100

## 2021-02-08 MED FILL — MD-GASTROVIEW 66-10 % PO SOLN: 66-10 % | ORAL | Qty: 30

## 2021-02-11 ENCOUNTER — Inpatient Hospital Stay: Admit: 2021-02-11 | Payer: MEDICARE | Primary: Internal Medicine

## 2021-02-11 ENCOUNTER — Ambulatory Visit: Admit: 2021-02-11 | Discharge: 2021-02-11 | Payer: MEDICARE | Attending: Internal Medicine | Primary: Internal Medicine

## 2021-02-11 ENCOUNTER — Encounter: Primary: Internal Medicine

## 2021-02-11 ENCOUNTER — Encounter: Attending: Internal Medicine | Primary: Internal Medicine

## 2021-02-11 DIAGNOSIS — C3491 Malignant neoplasm of unspecified part of right bronchus or lung: Secondary | ICD-10-CM

## 2021-02-11 LAB — COMPREHENSIVE METABOLIC PANEL
ALT: 19 U/L (ref 12–65)
AST: 17 U/L (ref 15–37)
Albumin/Globulin Ratio: 1.1 — ABNORMAL LOW (ref 1.2–3.5)
Albumin: 3.7 g/dL (ref 3.2–4.6)
Alk Phosphatase: 46 U/L — ABNORMAL LOW (ref 50–136)
Anion Gap: 6 mmol/L — ABNORMAL LOW (ref 7–16)
BUN: 19 MG/DL (ref 8–23)
CO2: 27 mmol/L (ref 21–32)
Calcium: 9.5 MG/DL (ref 8.3–10.4)
Chloride: 107 mmol/L (ref 98–107)
Creatinine: 1.4 MG/DL — ABNORMAL HIGH (ref 0.6–1.0)
GFR African American: 46 mL/min/{1.73_m2} — ABNORMAL LOW (ref >60)
GFR Non-African American: 38 mL/min/{1.73_m2} — ABNORMAL LOW (ref >60)
Globulin: 3.4 g/dL (ref 2.3–3.5)
Glucose: 78 mg/dL (ref 65–100)
Potassium: 3.9 mmol/L (ref 3.5–5.1)
Sodium: 140 mmol/L (ref 136–145)
Total Bilirubin: 0.9 MG/DL (ref 0.2–1.1)
Total Protein: 7.1 g/dL (ref 6.3–8.2)

## 2021-02-11 LAB — CBC WITH AUTO DIFFERENTIAL
Absolute Eos #: 0.1 10*3/uL (ref 0.0–0.8)
Absolute Immature Granulocyte: 0 10*3/uL (ref 0.0–0.5)
Absolute Lymph #: 1.9 10*3/uL (ref 0.5–4.6)
Absolute Mono #: 0.9 10*3/uL (ref 0.1–1.3)
Basophils Absolute: 0 10*3/uL (ref 0.0–0.2)
Basophils: 0 % (ref 0.0–2.0)
Eosinophils %: 1 % (ref 0.5–7.8)
Hematocrit: 34.9 % — ABNORMAL LOW (ref 35.8–46.3)
Hemoglobin: 11.4 g/dL — ABNORMAL LOW (ref 11.7–15.4)
Immature Granulocytes: 0 % (ref 0.0–5.0)
Lymphocytes: 24 % (ref 13–44)
MCH: 33.1 PG — ABNORMAL HIGH (ref 26.1–32.9)
MCHC: 32.7 g/dL (ref 31.4–35.0)
MCV: 101.5 FL — ABNORMAL HIGH (ref 79.6–97.8)
MPV: 9.3 FL — ABNORMAL LOW (ref 9.4–12.3)
Monocytes: 11 % (ref 4.0–12.0)
Platelets: 193 10*3/uL (ref 150–450)
RBC: 3.44 M/uL — ABNORMAL LOW (ref 4.05–5.2)
RDW: 12.7 % (ref 11.9–14.6)
Seg Neutrophils: 64 % (ref 43–78)
Segs Absolute: 4.9 10*3/uL (ref 1.7–8.2)
WBC: 7.7 10*3/uL (ref 4.3–11.1)
nRBC: 0 10*3/uL (ref 0.0–0.2)

## 2021-02-11 NOTE — Progress Notes (Signed)
[facility]: Office Visit Established Patient    Chief Complaint:    Chief Complaint   Patient presents with    Follow-up         History of Present Illness:  Audrey Snow is a 80 y.o. female who returns today for management of adenocarcinoma of the  lung.  On 02/27/20, Ms. Smigelski presented to [facility] ED with complaints of achiness in chest with radiation of pain to her left upper extremity with association of N/V, SOB and diaphoresis. Cardiac work-up was negative; however her chest x-ray reported a 5.1 cm rounded opacity at the right lung base.  She saw her PCP on 02/29/20, CT scan was ordered and reported a 5 cm mass with adjacent pleural nodularity posteriorly at the right lung base and considered suspicious for bronchogenic carcinoma. On 04/02/20 she  had a PET/CT scan that reported a 6 cm right lower lobe pulmonary mass with no metastatic disease.  Biopsy showed adenocarcinoma of lung origin.  We saw her prior to surgical evaluation and recommended proceeding with lobectomy and lymphadenectomy if  she was a surgical candidate.  She completed VATS with left lower lobectomy as well as chest wall mass resection and lymphadenectomy on 05/07/20.  Her tumor was 6.4 cm in greatest dimension with carcinoma in the specimen marked "chest wall" but with separation  from tumor by connective tissue and lung parenchyma.  13 lymph node fragments were all negative for tumor.  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  Because of the T3N0 disease, we would consider adjuvant  chemotherapy, likely cisplatin and pemetrexed for 4 cycles.  We do also have the ALCHEMIST trial available, but unfortunately she is not a candidate due to her history of RA with MTX use.  She completed 4 cycles of cisplatin and Alimta with adequate tolerance.  CT chest at the conclusion of therapy reviewed and shows nothing of suspicion in the lung.  However, she does have a 1.4 cm adrenal  nodule which was not obviously present on pretherapy imaging.  We recommended PET/CT to assess but this shows no significant uptake in the left adrenal or other areas of concern.  We will continue observation with repeat CT in 3 months.     Here for follow-up.  No changes overall, energy level is good although she notes that she goes to bed late and sleeps late.  Her husband feels like her memory has not recovered since chemotherapy, but she does not think this is significantly different.  He notes that it seems improved when she wears her hearing aids.  No other new symptoms, ECOG 1.       Review of Systems:  Constitutional: Positive for fatigue.   HENT: Negative.   Eyes: Negative.   Respiratory: Negative.   Cardiovascular: Negative.   Gastrointestinal: Negative.   Genitourinary: Negative.   Musculoskeletal: Negative.   Skin: Negative.   Neurological: Negative.   Endo/Heme/Allergies: Negative.   Psychiatric/Behavioral: Negative.   All other systems reviewed and are negative.       Allergies   Allergen Reactions    Yeast Nausea And Vomiting     Pt. Says she hasnt had issues with this in a long time    Yeast-Related Products Nausea And Vomiting and Headaches     Pt STATED NOT ALLERGIC     Past Medical History:   Diagnosis Date    Arrhythmia 04/05/2020  PVCs.  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, frequent ventricular ectopy, runs nonsustained VT    Autoimmune disease ([place])     RA    Bronchogenic cancer of right lung ([place]) 04/28/2020    adenocarcinoma    Chronic obstructive pulmonary disease ([place])     uses inhalers    Chronic pain of left knee 06/25/2018    Coronary atherosclerosis     noted on chest CT    COVID-19 vaccine series completed 08/15/2019    Pfizer; 07/29/2019 and booster given 03/12/2020    GERD (gastroesophageal reflux disease)     no meds at this time    H/O cardiovascular stress test     [facility]  protocol, low risk study, EF of 67% and normal LV function    H/O mammogram 07/28/2016    mammogram and Ultrasound of right breaset- benign finding- [facility]     Hearing loss     "only hears out of left ear"    Hearing loss of both ears     no hearing aids     HTN (hypertension), benign 01/30/2014    controlled w/med    Hypothyroid     on med for control    Menopause     Osteoarthritis     Osteoporosis     prolia     Poor historian     patient had trouble remembering names of meds and what they were for and had to repeat instructions for surgery several times     Rheumatoid arthritis(714.0) 01/30/2014    followed by Dr. Debbra Riding. 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 Nancie Neas    Unspecified hypothyroidism 01/30/2014    thyroid medication    Vitamin D deficiency     medications for this      Past Surgical History:   Procedure Laterality Date    CARPAL TUNNEL RELEASE Bilateral     COLONOSCOPY      colon polyps-adenomatous, has seen Dr. Kristopher Glee in past    COLONOSCOPY N/A 01/27/2018    COLONOSCOPY/BMI 26 performed by Azalia Bilis, MD at [place] FLX DX W/COLLJ SPEC WHEN PFRMD  01/27/2018         CT NEEDLE BIOPSY LUNG PERCUTANEOUS  04/16/2020    CT NEEDLE BIOPSY LUNG PERCUTANEOUS 04/16/2020 [facility] CT SCAN    HEMORRHOID SURGERY      OTHER SURGICAL HISTORY  09/2012    sinus surgery-Dr. Mickle Mallory    OTHER SURGICAL HISTORY  01/27/2013    Thymoma-Dr. Nancie Neas    TONSILLECTOMY  80 yrs old    UPPER GASTROINTESTINAL ENDOSCOPY  2015    Gastric AVM txed with cautery-Dr. Orpah Melter     Family History   Problem Relation Age of Onset    Osteoarthritis Mother     Breast Cancer Neg Hx     Alzheimer's Disease Father      Social History     Socioeconomic History    Marital status: Married     Spouse name: Not on file    Number of children: Not on file    Years of education: Not on file    Highest education level: Not on file   Occupational History    Not on file    Tobacco  Use    Smoking status: Former     Packs/day: 0.50     Types: Cigarettes     Start date: 07/08/1959     Quit date: 07/08/1983     Years since quitting: 37.6    Smokeless tobacco: Never    Tobacco comments:     Quit smoking: not regular when first started- 1pk/week   Substance and Sexual Activity    Alcohol use: No     Alcohol/week: 0.0 standard drinks    Drug use: No    Sexual activity: Not on file   Other Topics Concern    Not on file   Social History Narrative    Married and lives with husband.  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     Social Determinants of Health     Financial Resource Strain: Not on file   Food Insecurity: Not on file   Transportation Needs: Not on file   Physical Activity: Not on file   Stress: Not on file   Social Connections: Not on file   Intimate Partner Violence: Not on file   Housing Stability: Not on file     Current Outpatient Medications   Medication Sig Dispense Refill    levothyroxine (SYNTHROID) 100 MCG tablet TAKE 1 TABLET BY MOUTH DAILY BEFORE BREAKFAST 30 tablet 11    losartan (COZAAR) 100 MG tablet Take 1 tablet by mouth daily TAKE 1 TABLET BY MOUTH DAILY 90 tablet 2    metoprolol succinate (TOPROL XL) 50 MG extended release tablet Take 1 tablet by mouth daily 90 tablet 2    albuterol sulfate HFA 108 (90 Base) MCG/ACT inhaler Inhale 1 puff into the lungs every 6 hours as needed      atorvastatin (LIPITOR) 20 MG tablet Take 20 mg by mouth daily      denosumab (PROLIA) 60 MG/ML SOSY SC injection Inject 60 mg into the skin      ergocalciferol (ERGOCALCIFEROL) 1.25 MG (50000 UT) capsule Take 50,000 Units by mouth every 14 days      fluticasone (FLONASE) 50 MCG/ACT nasal spray 2 sprays by Nasal route daily      folic acid (FOLVITE) 1 MG tablet Take 1 mg by mouth daily      niacin 500 MG tablet Take 500 mg by mouth every morning (before breakfast)      umeclidinium-vilanterol (ANORO ELLIPTA) 62.5-25 MCG/INH AEPB inhaler Inhale 1 puff into the lungs daily (Patient not taking:  Reported on 02/11/2021) 1 each 11    gabapentin (NEURONTIN) 100 MG capsule Take 100 mg by mouth 3 times daily as needed. (Patient not taking: Reported on 02/11/2021)      lidocaine-prilocaine (EMLA) 2.5-2.5 % cream Apply topically as needed (Patient not taking: Reported on 02/11/2021)      ondansetron (ZOFRAN) 8 MG tablet Take 8 mg by mouth every 8 hours as needed (Patient not taking: Reported on 02/11/2021)      prochlorperazine (COMPAZINE) 10 MG tablet Take 10 mg by mouth every 6 hours as needed (Patient not taking: Reported on 02/11/2021)       No current facility-administered medications for this visit.       OBJECTIVE:  BP 98/67 (Site: Right Upper Arm, Position: Standing)    Pulse 79    Temp 97.8 ??F (36.6 ??C)    Resp 12    Ht '5\' 7"'  (1.702 m)    Wt 140 lb 4.8 oz (63.6 kg) Comment: w/o shoes   SpO2 97%  BMI 21.97 kg/m??     Physical Exam:  Constitutional: Well developed, well nourished female in no acute distress, sitting comfortably in the exam room chair.    HEENT: Normocephalic and atraumatic. Sclerae anicteric. Neck supple without JVD. No thyromegaly present.    Lymph node   No palpable submandibular, cervical, supraclavicular lymph nodes.   Skin Warm and dry.  No bruising and no rash noted.  No erythema.  No pallor.    Respiratory Lungs are clear to auscultation bilaterally without wheezes, rales or rhonchi, normal air exchange without accessory muscle use.    CVS Normal rate, regular rhythm and normal S1 and S2.  No murmurs, gallops, or rubs.   Abdomen Soft, nontender and nondistended, normoactive bowel sounds.  No palpable mass.  No hepatosplenomegaly.   Neuro Grossly nonfocal with no obvious sensory or motor deficits.   MSK Normal range of motion in general.  No edema and no tenderness.   Psych Appropriate mood and affect.      Labs:  Recent Results (from the past 96 hour(s))   POCT Creatinine - BLOOD    Collection Time: 02/08/21  8:36 AM   Result Value Ref Range    POC Creatinine 1.21 0.8 - 1.5 mg/dL    POC GFR  African American 55 (L) >60 ml/min/1.22m    Glomerular Filtration Rate, POC 45 (L) >60 ml/min/1.759m  CBC with Auto Differential    Collection Time: 02/11/21  1:26 PM   Result Value Ref Range    WBC 7.7 4.3 - 11.1 K/uL    RBC 3.44 (L) 4.05 - 5.2 M/uL    Hemoglobin 11.4 (L) 11.7 - 15.4 g/dL    Hematocrit 34.9 (L) 35.8 - 46.3 %    MCV 101.5 (H) 79.6 - 97.8 FL    MCH 33.1 (H) 26.1 - 32.9 PG    MCHC 32.7 31.4 - 35.0 g/dL    RDW 12.7 11.9 - 14.6 %    Platelets 193 150 - 450 K/uL    MPV 9.3 (L) 9.4 - 12.3 FL    nRBC 0.00 0.0 - 0.2 K/uL    Seg Neutrophils 64 43 - 78 %    Lymphocytes 24 13 - 44 %    Monocytes 11 4.0 - 12.0 %    Eosinophils % 1 0.5 - 7.8 %    Basophils 0 0.0 - 2.0 %    Immature Granulocytes 0 0.0 - 5.0 %    Segs Absolute 4.9 1.7 - 8.2 K/UL    Absolute Lymph # 1.9 0.5 - 4.6 K/UL    Absolute Mono # 0.9 0.1 - 1.3 K/UL    Absolute Eos # 0.1 0.0 - 0.8 K/UL    Basophils Absolute 0.0 0.0 - 0.2 K/UL    Absolute Immature Granulocyte 0.0 0.0 - 0.5 K/UL    Differential Type AUTOMATED     Comprehensive Metabolic Panel    Collection Time: 02/11/21  1:26 PM   Result Value Ref Range    Sodium 140 136 - 145 mmol/L    Potassium 3.9 3.5 - 5.1 mmol/L    Chloride 107 98 - 107 mmol/L    CO2 27 21 - 32 mmol/L    Anion Gap 6 (L) 7 - 16 mmol/L    Glucose 78 65 - 100 mg/dL    BUN 19 8 - 23 MG/DL    Creatinine 1.40 (H) 0.6 - 1.0 MG/DL    GFR African American 46 (L) >60 ml/min/1.7378m  GFR  Non-African American 38 (L) >60 ml/min/1.74m    Calcium 9.5 8.3 - 10.4 MG/DL    Total Bilirubin 0.9 0.2 - 1.1 MG/DL    ALT 19 12 - 65 U/L    AST 17 15 - 37 U/L    Alk Phosphatase 46 (L) 50 - 136 U/L    Total Protein 7.1 6.3 - 8.2 g/dL    Albumin 3.7 3.2 - 4.6 g/dL    Globulin 3.4 2.3 - 3.5 g/dL    Albumin/Globulin Ratio 1.1 (L) 1.2 - 3.5         Imaging:  CT CHEST ABDOMEN PELVIS W CONTRAST    Result Date: 02/08/2021  EXAM: CT CHEST, ABDOMEN, AND PELVIS WITH CONTRAST INDICATION: Lung cancer. COMPARISON: PET/CT 11/08/2020, chest CT 10/22/2020  TECHNIQUE:  CT imaging was performed of the chest, abdomen, and pelvis after intravenous injection of 100 mL Isovue 370. Intravenous contrast was used for better evaluation of solid organs and vascular structures. Oral contrast was used for bowel opacification. Coronal reformatted imaging provided. Radiation dose reduction techniques were used for this study. Our CT scanners use one or all of the following: Automated exposure control, adjustment of the mA and/or kV according to patient size, iterative reconstruction. FINDINGS: CHEST: Mediastinum and visualized thyroid: No lymphadenopathy. Heart: Multivessel coronary atherosclerotic calcifications. Large Vessels: Normal. Pleura: Normal. Lungs: Post surgical changes in the right lung. No recurrent tumor evident. Normal right lung hilum. Airways: Normal. ABDOMEN AND PELVIS: Motion limited evaluation of the abdomen Liver: Normal in size and morphology.  No focal lesions. Gallbladder/biliary: Normal gallbladder.  No biliary dilatation. Pancreas: Normal. Spleen: Normal. Adrenals: Stable appearance of the left adrenal gland.. Kidneys: No focal lesion or hydronephrosis. Bladder: Normal. Pelvic organs: Normal uterus.  No adnexal mass. Gastrointestinal: Colonic diverticulosis.. Peritoneum/retroperitoneum: Normal. Lymph nodes: Normal. Vessels: Aortic atherosclerotic disease. Bones/Soft tissues: No aggressive appearing bone lesion.     Post surgical changes in the right lung without evidence of locally recurrent tumor or metastatic disease in the chest, abdomen, or pelvis.        ASSESSMENT:   Diagnosis Orders   1. Adenocarcinoma of right lung, stage 2 (HCC)  CT CHEST ABDOMEN PELVIS W CONTRAST    CBC with Auto Differential    Comprehensive Metabolic Panel      2. Right lower lobe lung mass  CT CHEST ABDOMEN PELVIS W CONTRAST      3. Large cell carcinoma of lung, right (HCC)  CT CHEST ABDOMEN PELVIS W CONTRAST    CBC with Auto Differential    Comprehensive Metabolic Panel             PLAN:  Lab studies were personally reviewed.    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  PET/CT shows no distant disease.  Clinically T3N0M0.  She completed VATS with left lower lobectomy as well as chest wall mass resection  and lymphadenectomy on 05/07/20.  She tolerated this reasonably well, still with some post-op tenderness but nothing unmanageable.  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  13 lymph node fragments were all negative for tumor.  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  We  do also have the ALCHEMIST trial available, but unfortunately she is not a candidate due to her history of RA with MTX use.  She completed 4 cycles of cisplatin and Alimta with adequate tolerance.  CT  chest at the conclusion of therapy reviewed and shows nothing of suspicion in the lung.  However, she does have a 1.4 cm adrenal nodule which was not obviously present on pretherapy imaging.  We recommended PET/CT to assess but this shows no significant uptake in the left adrenal or other areas of concern.  We will continue observation with repeat CT in 3 months.     Here for follow-up.  No changes overall, energy level is good although she notes that she goes to bed late and sleeps late.  Her husband feels like her memory has not recovered since chemotherapy, but she does not think this is significantly different.  He notes that it seems improved when she wears her hearing aids.  No other new symptoms, ECOG 1.  CT reviewed and shows no evidence of growth in the adrenal gland or any other areas of concern, continued CR.  We will continue observation, OK to  move back to every 6 months.  They will talk to her PCP about the possibility of medications for her cognitive impairment, there is nothings specific for chemotherapy related NCI but she may benefit from other therapy such as cholinesterase inhibitors.     She is comfortable with the plan.  All questions were asked and answered to the best of my ability.  F/u in 6 months or sooner if needed for any clinical symptoms.             Greer Ee, MD, MD  [facility] [place] [address]  Office : [phone]  Fax : [phone]

## 2021-02-11 NOTE — Patient Instructions (Signed)
Hospital Outpatient Visit on 02/11/2021   Component Date Value Ref Range Status    WBC 02/11/2021 7.7  4.3 - 11.1 K/uL Final    RBC 02/11/2021 3.44 (A) 4.05 - 5.2 M/uL Final    Hemoglobin 02/11/2021 11.4 (A) 11.7 - 15.4 g/dL Final    Hematocrit 02/11/2021 34.9 (A) 35.8 - 46.3 % Final    MCV 02/11/2021 101.5 (A) 79.6 - 97.8 FL Final    MCH 02/11/2021 33.1 (A) 26.1 - 32.9 PG Final    MCHC 02/11/2021 32.7  31.4 - 35.0 g/dL Final    RDW 02/11/2021 12.7  11.9 - 14.6 % Final    Platelets 02/11/2021 193  150 - 450 K/uL Final    MPV 02/11/2021 9.3 (A) 9.4 - 12.3 FL Final    nRBC 02/11/2021 0.00  0.0 - 0.2 K/uL Final    **Note: Absolute NRBC parameter is now reported with Hemogram**    Seg Neutrophils 02/11/2021 64  43 - 78 % Final    Lymphocytes 02/11/2021 24  13 - 44 % Final    Monocytes 02/11/2021 11  4.0 - 12.0 % Final    Eosinophils % 02/11/2021 1  0.5 - 7.8 % Final    Basophils 02/11/2021 0  0.0 - 2.0 % Final    Immature Granulocytes 02/11/2021 0  0.0 - 5.0 % Final    Segs Absolute 02/11/2021 4.9  1.7 - 8.2 K/UL Final    Absolute Lymph # 02/11/2021 1.9  0.5 - 4.6 K/UL Final    Absolute Mono # 02/11/2021 0.9  0.1 - 1.3 K/UL Final    Absolute Eos # 02/11/2021 0.1  0.0 - 0.8 K/UL Final    Basophils Absolute 02/11/2021 0.0  0.0 - 0.2 K/UL Final    Absolute Immature Granulocyte 02/11/2021 0.0  0.0 - 0.5 K/UL Final    Differential Type 02/11/2021 AUTOMATED    Final    Sodium 02/11/2021 140  136 - 145 mmol/L Final    Potassium 02/11/2021 3.9  3.5 - 5.1 mmol/L Final    Chloride 02/11/2021 107  98 - 107 mmol/L Final    CO2 02/11/2021 27  21 - 32 mmol/L Final    Anion Gap 02/11/2021 6 (A) 7 - 16 mmol/L Final    Glucose 02/11/2021 78  65 - 100 mg/dL Final    BUN 02/11/2021 19  8 - 23 MG/DL Final    Creatinine 02/11/2021 1.40 (A) 0.6 - 1.0 MG/DL Final    GFR African American 02/11/2021 46 (A) >60 ml/min/1.85m Final    GFR Non-African American 02/11/2021 38 (A) >60 ml/min/1.728mFinal    Comment:      Estimated GFR is calculated  using the Modification of Diet in Renal Disease (MDRD) Study equation, reported for both African Americans (GFRAA) and non-African Americans (GFRNA), and normalized to 1.7344mody surface area. The physician must decide which value applies to the patient.  The MDRD study equation should only be used in individuals age 25 67 older. It has not been validated for the following: pregnant women, patients with serious comorbid conditions,or on certain medications, or persons with extremes of body size, muscle mass, or nutritional status.      Calcium 02/11/2021 9.5  8.3 - 10.4 MG/DL Final    Total Bilirubin 02/11/2021 0.9  0.2 - 1.1 MG/DL Final    ALT 02/11/2021 19  12 - 65 U/L Final    AST 02/11/2021 17  15 - 37 U/L Final    Alk Phosphatase 02/11/2021  46 (A) 50 - 136 U/L Final    Total Protein 02/11/2021 7.1  6.3 - 8.2 g/dL Final    Albumin 02/11/2021 3.7  3.2 - 4.6 g/dL Final    Globulin 02/11/2021 3.4  2.3 - 3.5 g/dL Final    Albumin/Globulin Ratio 02/11/2021 1.1 (A) 1.2 - 3.5   Final   Hospital Outpatient Visit on 02/08/2021   Component Date Value Ref Range Status    POC Creatinine 02/08/2021 1.21  0.8 - 1.5 mg/dL Final    POC GFR African American 02/08/2021 55 (A) >60 ml/min/1.19m Final    Glomerular Filtration Rate, POC 02/08/2021 45 (A) >60 ml/min/1.719mFinal    Comment: (NOTE)  Estimated GFR is calculated using the Modification of Diet in Renal   Disease (MDRD) Study Equation, reported for both African Americans   (GFRAA) and non-African Americans (GFRNA), and normalized to 1.7351m body surface area. The physician must decide which value applies to   the patient. The MDRD study equation should only be used in   individuals age 46 32 older. It has not been validated for the   following: pregnant women, patients with serious comorbid conditions,   or on certain medications, or persons with extremes of body size,   muscle mass, or nutritional status.

## 2021-02-12 NOTE — Telephone Encounter (Signed)
Patient can not afford $400 for Anoro Ellipta.  Asking if we can call in:    Albuterol sulfate HFA inhaler

## 2021-02-14 NOTE — Telephone Encounter (Signed)
Spoke with the patient and informed her that she needs to reach out to her insurance company and find out which inhaler is covered that is similar to [employer]. Medicare's phone number has been provided to the patient. She was encouraged to call back if she has any questions regarding this.

## 2021-02-21 ENCOUNTER — Ambulatory Visit: Payer: MEDICARE | Primary: Internal Medicine

## 2021-03-23 ENCOUNTER — Inpatient Hospital Stay: Admit: 2021-03-23 | Payer: MEDICARE | Primary: Internal Medicine

## 2021-03-23 DIAGNOSIS — Z1231 Encounter for screening mammogram for malignant neoplasm of breast: Secondary | ICD-10-CM

## 2021-04-25 ENCOUNTER — Encounter: Admit: 2021-04-25 | Discharge: 2021-04-25 | Payer: MEDICARE | Primary: Internal Medicine

## 2021-04-25 DIAGNOSIS — Z23 Encounter for immunization: Secondary | ICD-10-CM

## 2021-04-26 NOTE — Progress Notes (Signed)
Patient given flu vaccine 0.5 mL in left deltoid

## 2021-05-03 MED ORDER — ATORVASTATIN CALCIUM 20 MG PO TABS
20 MG | ORAL_TABLET | Freq: Every day | ORAL | 1 refills | Status: DC
Start: 2021-05-03 — End: 2021-11-20

## 2021-05-03 MED ORDER — FOLIC ACID 1 MG PO TABS
1 MG | ORAL_TABLET | Freq: Every day | ORAL | 1 refills | Status: DC
Start: 2021-05-03 — End: 2021-11-05

## 2021-06-24 ENCOUNTER — Encounter: Payer: MEDICARE | Attending: Nurse Practitioner | Primary: Internal Medicine

## 2021-06-26 ENCOUNTER — Encounter: Admit: 2021-06-26 | Discharge: 2021-06-26 | Payer: MEDICARE | Attending: Internal Medicine | Primary: Internal Medicine

## 2021-06-26 DIAGNOSIS — M0579 Rheumatoid arthritis with rheumatoid factor of multiple sites without organ or systems involvement: Secondary | ICD-10-CM

## 2021-06-26 NOTE — Progress Notes (Signed)
HPI: Audrey Snow (DOB: [birth year])    States her hands bother her with pain and with deformity and going to Rheumatology    Need to update labs    Has had sinus congestion for past 24-48 hrs    Audrey Snow has a cough but not severe    Has chronic severe hearing loss        Problem List:  Patient Active Problem List   Diagnosis   ??? Osteoporosis   ??? Rheumatoid arthritis with rheumatoid factor of multiple sites without organ or systems involvement ([place])   ??? Dry skin dermatitis   ??? Side effect of medication   ??? Hyperlipidemia   ??? Chronic right-sided low back pain without sciatica   ??? Pre-ulcerative corn or callous   ??? Right lower lobe lung mass   ??? Vitamin D deficiency   ??? Prediabetes   ??? Environmental allergies   ??? HTN (hypertension)   ??? Bilateral leg cramps   ??? Gastroesophageal reflux disease without esophagitis   ??? Large cell carcinoma of lung, right ([place])   ??? Adenocarcinoma of right lung, stage 2 ([place])   ??? Bronchogenic cancer of right lung ([place])   ??? Chronic pain of left knee   ??? Elevated blood sugar   ??? Chronic obstructive pulmonary disease ([place])   ??? Hypothyroidism   ??? Chronic renal disease, stage III ([place]) [188416]   ??? Short-term memory loss       History:  Past Medical History:   Diagnosis Date   ??? Arrhythmia 04/05/2020    PVCs.  04/05/20 was referred to [facility] for tachycardia, was seen 05/01/20 [facility]   ??? 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 Autoimmune disease ([place])     RA   ??? Bronchogenic cancer of right lung ([place]) 04/28/2020    adenocarcinoma   ??? Chronic obstructive pulmonary disease (H[facility])     uses inhalers   ??? Chronic pain of left knee 06/25/2018   ??? Coronary atherosclerosis     noted on chest CT   ??? COVID-19 vaccine series completed 08/15/2019    Pfizer; 07/29/2019 and booster given 03/12/2020   ??? GERD (gastroesophageal reflux disease)     no meds at this time   ??? H/O cardiovascular stress test     Bruce protocol, low risk study,  EF of 67% and normal LV function   ??? H/O mammogram 07/28/2016    mammogram and Ultrasound of right breaset- benign finding- [facility]    ??? Hearing loss     "only hears out of left ear"   ??? Hearing loss of both ears     no hearing aids    ??? HTN (hypertension), benign 01/30/2014    controlled w/med   ??? Hypothyroid     on med for control   ??? Menopause    ??? Osteoarthritis    ??? Osteoporosis     prolia    ??? Poor historian     patient had trouble remembering names of meds and what they were for and had to repeat instructions for surgery several times    ??? 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 Debbra Riding. Methotrexate Rx   ??? Sinusitis     sinus flonase    ??? 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 Nancie Neas   ??? Unspecified hypothyroidism 01/30/2014    thyroid medication   ??? Vitamin D  deficiency     medications for this        Allergies:  Allergies   Allergen Reactions   ??? Yeast Nausea And Vomiting     Pt. Says Audrey Snow hasnt had issues with this in a long time   ??? Yeast-Related Products Nausea And Vomiting and Headaches     Pt STATED NOT ALLERGIC       Current Medications:  Current Outpatient Medications   Medication Sig Dispense Refill   ??? folic acid (FOLVITE) 1 MG tablet Take 1 tablet by mouth daily 90 tablet 1   ??? atorvastatin (LIPITOR) 20 MG tablet Take 1 tablet by mouth daily 90 tablet 1   ??? levothyroxine (SYNTHROID) 100 MCG tablet TAKE 1 TABLET BY MOUTH DAILY BEFORE BREAKFAST 30 tablet 11   ??? losartan (COZAAR) 100 MG tablet Take 1 tablet by mouth daily TAKE 1 TABLET BY MOUTH DAILY 90 tablet 2   ??? metoprolol succinate (TOPROL XL) 50 MG extended release tablet Take 1 tablet by mouth daily 90 tablet 2   ??? albuterol sulfate HFA 108 (90 Base) MCG/ACT inhaler Inhale 1 puff into the lungs every 6 hours as needed     ??? ergocalciferol (ERGOCALCIFEROL) 1.25 MG (50000 UT) capsule Take 50,000 Units by mouth every 14 days     ??? fluticasone (FLONASE) 50 MCG/ACT nasal spray 2 sprays by Nasal route daily     ??? niacin 500 MG  tablet Take 500 mg by mouth every morning (before breakfast)     ??? denosumab (PROLIA) 60 MG/ML SOSY SC injection Inject 60 mg into the skin       No current facility-administered medications for this visit.       Review of Systems:  Review of Systems   Constitutional:  Negative for unexpected weight change.   HENT:  Positive for congestion and hearing loss.    Respiratory:  Positive for cough.    Cardiovascular:  Negative for chest pain.   Musculoskeletal:  Positive for arthralgias.   All other systems reviewed and are negative.    Vitals:  BP 120/70    Ht 5\' 7"  (1.702 m)    Wt 150 lb 9.6 oz (68.3 kg)    BMI 23.59 kg/m??     Physical Exam:  Physical Exam  Vitals reviewed.   Constitutional:       Appearance: Normal appearance.   HENT:      Head: Normocephalic and atraumatic.      Ears:      Comments: Hearing loss severe  Eyes:      Extraocular Movements: Extraocular movements intact.      Pupils: Pupils are equal, round, and reactive to light.   Cardiovascular:      Rate and Rhythm: Normal rate and regular rhythm.      Heart sounds: Normal heart sounds.   Pulmonary:      Effort: Pulmonary effort is normal.      Breath sounds: Normal breath sounds. No wheezing.   Musculoskeletal:         General: Deformity present.      Cervical back: Normal range of motion and neck supple.      Comments: Decrease ROM of fingers on right hand worse than left  Joint deformities   Skin:     General: Skin is warm and dry.   Neurological:      General: No focal deficit present.      Mental Status: Audrey Snow is alert and oriented to person, place,  and time.   Psychiatric:         Mood and Affect: Mood normal.         Behavior: Behavior normal.         Thought Content: Thought content normal.         Judgment: Judgment normal.        Assessment/Plan:   Audrey Snow was seen today for follow-up.    Diagnoses and all orders for this visit:    Rheumatoid arthritis with rheumatoid factor of multiple sites without organ or systems involvement  ([place])  Follow with Rheumatology  Mixed hyperlipidemia  -     Lipid Panel; Future  On statin therapy with lipitor  Acquired hypothyroidism  -     TSH; Future  updatelabs  Primary hypertension  -     CBC with Auto Differential; Future  -     Comprehensive Metabolic Panel; Future  BP controlled on current meds  Sinus congestion      Try OTC Guaf/PE prn    Current medications are therapeutic at this time; continue as prescribed.    Has severe hearing loss which makes communication difficult at times     [name], MD

## 2021-06-27 ENCOUNTER — Encounter: Payer: MEDICARE | Attending: Nurse Practitioner | Primary: Internal Medicine

## 2021-07-09 ENCOUNTER — Encounter: Admit: 2021-07-09 | Discharge: 2021-07-09 | Payer: MEDICARE | Attending: Nurse Practitioner | Primary: Internal Medicine

## 2021-07-09 DIAGNOSIS — J449 Chronic obstructive pulmonary disease, unspecified: Secondary | ICD-10-CM

## 2021-07-09 LAB — SPIROMETRY WITHOUT BRONCHODILATOR
FEV1 %Pred-Pre: 58 %
FEV1/FVC: 53 %
FEV1: 1.27 L
FVC %Pred-Pre: 82 %
FVC: 2.4 L

## 2021-07-09 MED ORDER — ANORO ELLIPTA 62.5-25 MCG/ACT IN AEPB
62.5-25 | Freq: Every day | RESPIRATORY_TRACT | 11 refills | Status: DC
Start: 2021-07-09 — End: 2022-01-01

## 2021-07-09 NOTE — Progress Notes (Signed)
Name:  Audrey Snow  Date of Birth:  07-03-1941   MRN: [id number]      Office Visit: 07/09/2021        ASSESSMENT AND PLAN:  (Medical Decision Making)    Impression:     1. Chronic obstructive pulmonary disease, unspecified COPD type ([place])  --some overall decline in spirometry.  Will add Anoro once daily.  Patient is given verbal instructions on proper use of prescribed inhaler and is able to give adequate return demonstration.  Inhaler education was indicated due to new medication.  - Spirometry Without Bronchodilator  - Umeclidinium-Vilanterol (ANORO ELLIPTA) 62.5-25 MCG/ACT AEPB; Inhale 1 puff into the lungs daily  Dispense: 1 each; Refill: 11  - DEMO &/OR EVAL,PT USE,AEROSOL DEVICE    2. Personal history of tobacco use  --5 pack year history of smoking--quit in 1985--does not qualify for LDCT.    3. Large cell carcinoma of lung, right (HCC)  --S/P surgery, S/P 4 cycles of cisplatin and Alimat.    4. Dyspnea on exertion  --worse-  some decline in spirometry.  Will start Anoro--see above.    Orders Placed This Encounter   Medications    Umeclidinium-Vilanterol (ANORO ELLIPTA) 62.5-25 MCG/ACT AEPB     Sig: Inhale 1 puff into the lungs daily     Dispense:  1 each     Refill:  11     No orders of the defined types were placed in this encounter.    Follow-up and Dispositions    Return in about 6 months (around 01/06/2022) for Terryn Rosenkranz, COPD, spirometry.       Collaborating physician is Dr. Teressa Lower.    ADS    Georgiana Shore, NP, APRN - CNP      _________________________________________________________________________    HISTORY OF PRESENT ILLNESS:    Ms. Audrey Snow is a 81 y.o. female who is seen at [facility] today for  COPD     The patient is an 81 year old female who is seen for follow up of COPD and history of adenocarcinoma--6 cm RLL mass.  She has a history of vitamin D deficiency, hypothyroidism, RA (Dr. Alexandria Lodge MTX), osteoporosis, HTN, extreme hearing loss, and GERD.  She is accompanied by her  granddaughter who assists with the history due to her severe hearing loss.  She has previously been followed by [facility] pulmonary for COPD.  She  has a 21 pack year history of cigarette smoking, but quit in 1985.  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 Trina Ao.  She had a near syncopal episode in July which led to a CT of  chest which demonstrated an incidental finding of a 5 cm RLL mass.  She has seen cardiology at [facility] and had Holter monitor and stress test (poor exercise capacity).  Denies any fever, chills, or night sweats.  Weight has been stable.  No  hemoptysis.        Had IR biopsy of right lung mass which revealed adenocarcinoma.  Was presented at tumor board--recommendation was for surgical resection and adjuvant chemotherapy.  Had right VATS and RLLobectomy, mediastinal lymphadenctomy, and cryoablation of intercostal  nerves on 05/07/20 by Dr. Sheria Lang.  Had port placed 07/04/20.  Sees Dr. Jenetta Loges.    Reports worsening DOE when getting dressed or if she gets upset.  Husband passed away unexpectedly on Christmas Eve.  No cough or wheezing.    REVIEW  OF SYSTEMS: 10 point review of systems is negative except as reported in HPI.    PHYSICAL EXAM: Body mass index is 23.24 kg/m??.  Vitals:    07/09/21 1121   BP: 130/72   Pulse: 75   Resp: 17   Temp: 97.4 ??F (36.3 ??C)   TempSrc: Skin   SpO2: 97%   Weight: 148 lb 6.4 oz (67.3 kg)   Height: '5\' 7"'  (1.702 m)         General:   Alert, cooperative, no distress, appears stated age.        Eyes:   Conjunctivae/corneas clear. PERRL        Mouth/Throat:  Lips, mucosa, and tongue normal. Teeth and gums normal.        Lungs:     Breath sounds minimally decreased bilaterally, but clear.     Heart:   Regular rate and rhythm, S1, S2 normal, no murmur, click, rub or gallop.     Abdomen:    Soft, non-tender.     Extremities:  Extremities normal, atraumatic, no cyanosis or edema.     Skin:  Skin color normal. No rashes or  lesions     Neurologic:  A&Ox3     DIAGNOSTIC TESTS:                                                                                    LABS:   Lab Results   Component Value Date/Time    WBC 7.7 02/11/2021 01:26 PM    HGB 11.4 02/11/2021 01:26 PM    HCT 34.9 02/11/2021 01:26 PM    PLT 193 02/11/2021 01:26 PM    TSH 20.500 06/07/2020 02:57 PM    ESR 9 06/25/2018 11:38 AM    CRP 3 06/25/2018 11:38 AM     Imaging: I performed an independent interpretation of the patient's images.  CXR:   XR ABDOMEN (KUB) (SINGLE AP VIEW) 12/12/2020    Narrative  ABDOMINAL FILMS, 1 view(s).    INDICATION: Right lower quadrant. Pneumobilia.    TECHNIQUE:  Supine views of the abdomen on 2 image(s).    COMPARISON: None.    FINDINGS: Bowel gas pattern is unremarkable. Bowel loops are not abnormally  dilated. There is gas in the stomach and rectum. Blunting right costophrenic  angle and surgical changes at the level of the diaphragm.    Impression  Unremarkable bowel gas pattern.    CT Chest:   CT CHEST ABDOMEN PELVIS W CONTRAST 02/08/2021    Narrative  EXAM: CT CHEST, ABDOMEN, AND PELVIS WITH CONTRAST    INDICATION: Lung cancer.    COMPARISON: PET/CT 11/08/2020, chest CT 10/22/2020    TECHNIQUE:  CT imaging was performed of the chest, abdomen, and pelvis after  intravenous injection of 100 mL Isovue 370. Intravenous contrast was used for  better evaluation of solid organs and vascular structures. Oral contrast was  used for bowel opacification. Coronal reformatted imaging provided. Radiation  dose reduction techniques were used for this study. Our CT scanners use one or  all of the following: Automated exposure control, adjustment of the mA and/or kV  according to patient size, iterative reconstruction.  FINDINGS:    CHEST:    Mediastinum and visualized thyroid: No lymphadenopathy.    Heart: Multivessel coronary atherosclerotic calcifications.    Large Vessels: Normal.    Pleura: Normal.    Lungs: Post surgical changes in the right lung. No  recurrent tumor evident.  Normal right lung hilum.    Airways: Normal.    ABDOMEN AND PELVIS:  Motion limited evaluation of the abdomen    Liver: Normal in size and morphology.  No focal lesions.    Gallbladder/biliary: Normal gallbladder.  No biliary dilatation.    Pancreas: Normal.    Spleen: Normal.    Adrenals: Stable appearance of the left adrenal gland..    Kidneys: No focal lesion or hydronephrosis.    Bladder: Normal.    Pelvic organs: Normal uterus.  No adnexal mass.    Gastrointestinal: Colonic diverticulosis..    Peritoneum/retroperitoneum: Normal.    Lymph nodes: Normal.    Vessels: Aortic atherosclerotic disease.    Bones/Soft tissues: No aggressive appearing bone lesion.    Impression  Post surgical changes in the right lung without evidence of locally recurrent  tumor or metastatic disease in the chest, abdomen, or pelvis.    Nuclear Medicine:   PET CT SKULL BASE TO MID THIGH 11/08/2020    Narrative  PET/CT    Indication: Restaging right lung cancer    Radiopharmaceutical: 10.78 mCi F18-FDG, intravenously.    Technique: Imaging was performed from the skull through the proximal thighs  using routine PET/CT acquisition protocol. Imaging was performed approximately  60 minutes post injection. Oral contrast was administered. Radiation dose  reduction techniques were used for this study:  Our CT scanners use one or all  of the following: Automated exposure control, adjustment of the mA and/or kVp  according to patient's size, iterative reconstruction.      Serum glucose: 104 mg/dL prior to injection.    Comparison studies: CT chest 10/22/2020, PET/CT 04/02/2020    Findings:    Head and Neck: Stable hypermetabolic nodule in the left parotid gland.    Chest: Multivessel coronary atherosclerotic calcifications. Aortic  atherosclerotic calcifications without aneurysm. The lungs are emphysematous. No  recurrent mass or suspicious elevated FDG uptake.    No enlarged or hypermetabolic hilar or mediastinal lymph  nodes.    Abdomen/Pelvis: No lymphadenopathy. No worrisome focal uptake in the abdominal  viscera. Normal uterus. No adnexal mass. Aortic atherosclerotic calcifications  without aneurysm.    Bones: No aggressive bone lesion or worrisome focal FDG uptake.    Impression  No recurrent mass in the right lung. No evidence of distant metastatic disease.    PFTs:         CPFTs--04/04/20--    Echo: No results found for this or any previous visit from the past 3650 days.    PMH Reference Info:                                                                                                                  Past Medical  History:   Diagnosis Date    Arrhythmia 04/05/2020    PVCs.  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, frequent ventricular ectopy, runs nonsustained VT    Autoimmune disease ([place])     RA    Bronchogenic cancer of right lung ([place]) 04/28/2020    adenocarcinoma    Chronic obstructive pulmonary disease ([place])     uses inhalers    Chronic pain of left knee 06/25/2018    Coronary atherosclerosis     noted on chest CT    COVID-19 vaccine series completed 08/15/2019    Pfizer; 07/29/2019 and booster given 03/12/2020    GERD (gastroesophageal reflux disease)     no meds at this time    H/O cardiovascular stress test     Bruce protocol, low risk study, EF of 67% and normal LV function    H/O mammogram 07/28/2016    mammogram and Ultrasound of right breaset- benign finding- [facility]     Hearing loss     "only hears out of left ear"    Hearing loss of both ears     no hearing aids     HTN (hypertension), benign 01/30/2014    controlled w/med    Hypothyroid     on med for control    Menopause     Osteoarthritis     Osteoporosis     prolia     Poor historian     patient had trouble remembering names of meds and what they were for and had to repeat instructions for surgery several times     Rheumatoid arthritis(714.0) 01/30/2014     followed by Dr. Debbra Riding. 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 Nancie Neas    Unspecified hypothyroidism 01/30/2014    thyroid medication    Vitamin D deficiency     medications for this         Tobacco Use      Smoking status: Former        Packs/day: 0.50        Years: 10.00        Pack years: 5        Types: Cigarettes        Start date: 07/08/1959        Quit date: 07/08/1983        Years since quitting: 38.0      Smokeless tobacco: Never      Tobacco comments: Quit smoking: not regular when first started- 1pk/week    Allergies   Allergen Reactions    Yeast Nausea And Vomiting     Pt. Says she hasnt had issues with this in a long time    Yeast-Related Products Nausea And Vomiting and Headaches     Pt STATED NOT ALLERGIC     Current Outpatient Medications   Medication Instructions    albuterol sulfate HFA 108 (90 Base) MCG/ACT inhaler 1 puff, Inhalation, EVERY 6 HOURS PRN    atorvastatin (LIPITOR) 20 mg, Oral, DAILY    denosumab (PROLIA) 60 mg    fluticasone (FLONASE) 50 MCG/ACT nasal spray 2 sprays, Nasal, DAILY    folic acid (FOLVITE) 1 mg, Oral, DAILY    levothyroxine (SYNTHROID) 100 MCG tablet TAKE 1 TABLET BY MOUTH DAILY BEFORE BREAKFAST    losartan (  COZAAR) 100 mg, Oral, DAILY, TAKE 1 TABLET BY MOUTH DAILY    metoprolol succinate (TOPROL XL) 50 mg, Oral, DAILY    Umeclidinium-Vilanterol (ANORO ELLIPTA) 62.5-25 MCG/ACT AEPB 1 puff, Inhalation, DAILY

## 2021-08-14 ENCOUNTER — Inpatient Hospital Stay: Admit: 2021-08-14 | Payer: MEDICARE | Primary: Internal Medicine

## 2021-08-14 ENCOUNTER — Ambulatory Visit: Admit: 2021-08-14 | Discharge: 2021-08-14 | Payer: MEDICARE | Attending: Internal Medicine | Primary: Internal Medicine

## 2021-08-14 DIAGNOSIS — C3491 Malignant neoplasm of unspecified part of right bronchus or lung: Secondary | ICD-10-CM

## 2021-08-14 LAB — CBC WITH AUTO DIFFERENTIAL
Absolute Eos #: 0.1 10*3/uL (ref 0.0–0.8)
Absolute Immature Granulocyte: 0 10*3/uL (ref 0.0–0.5)
Absolute Lymph #: 1.9 10*3/uL (ref 0.5–4.6)
Absolute Mono #: 0.7 10*3/uL (ref 0.1–1.3)
Basophils Absolute: 0 10*3/uL (ref 0.0–0.2)
Basophils: 0 % (ref 0.0–2.0)
Eosinophils %: 1 % (ref 0.5–7.8)
Hematocrit: 40.1 % (ref 35.8–46.3)
Hemoglobin: 13.2 g/dL (ref 11.7–15.4)
Immature Granulocytes: 0 % (ref 0.0–5.0)
Lymphocytes: 19 % (ref 13–44)
MCH: 32 PG (ref 26.1–32.9)
MCHC: 32.9 g/dL (ref 31.4–35.0)
MCV: 97.3 FL (ref 82.0–102.0)
MPV: 9.7 FL (ref 9.4–12.3)
Monocytes: 7 % (ref 4.0–12.0)
Platelets: 208 10*3/uL (ref 150–450)
RBC: 4.12 M/uL (ref 4.05–5.2)
RDW: 13.1 % (ref 11.9–14.6)
Seg Neutrophils: 72 % (ref 43–78)
Segs Absolute: 7.2 10*3/uL (ref 1.7–8.2)
WBC: 10 10*3/uL (ref 4.3–11.1)
nRBC: 0 10*3/uL (ref 0.0–0.2)

## 2021-08-14 LAB — COMPREHENSIVE METABOLIC PANEL
ALT: 16 U/L (ref 12–65)
AST: 13 U/L — ABNORMAL LOW (ref 15–37)
Albumin/Globulin Ratio: 0.9 (ref 0.4–1.6)
Albumin: 3.7 g/dL (ref 3.2–4.6)
Alk Phosphatase: 47 U/L — ABNORMAL LOW (ref 50–136)
Anion Gap: 4 mmol/L (ref 2–11)
BUN: 20 MG/DL (ref 8–23)
CO2: 28 mmol/L (ref 21–32)
Calcium: 9.4 MG/DL (ref 8.3–10.4)
Chloride: 107 mmol/L (ref 101–110)
Creatinine: 1.5 MG/DL — ABNORMAL HIGH (ref 0.6–1.0)
Est, Glom Filt Rate: 35 mL/min/{1.73_m2} — ABNORMAL LOW (ref >60)
Globulin: 3.9 g/dL (ref 2.8–4.5)
Glucose: 100 mg/dL (ref 65–100)
Potassium: 3.8 mmol/L (ref 3.5–5.1)
Sodium: 139 mmol/L (ref 133–143)
Total Bilirubin: 1 MG/DL (ref 0.2–1.1)
Total Protein: 7.6 g/dL (ref 6.3–8.2)

## 2021-08-14 NOTE — Progress Notes (Signed)
[facility]: Office Visit Established Patient    Chief Complaint:    Chief Complaint   Patient presents with    Follow-up         History of Present Illness:  Audrey Snow is a 81 y.o. female who returns today for management of adenocarcinoma of the  lung.  On 02/27/20, Audrey Snow presented to [facility] ED with complaints of achiness in chest with radiation of pain to her left upper extremity with association of N/V, SOB and diaphoresis. Cardiac work-up was negative; however her chest x-ray reported a 5.1 cm rounded opacity at the right lung base.  She saw her PCP on 02/29/20, CT scan was ordered and reported a 5 cm mass with adjacent pleural nodularity posteriorly at the right lung base and considered suspicious for bronchogenic carcinoma. On 04/02/20 she  had a PET/CT scan that reported a 6 cm right lower lobe pulmonary mass with no metastatic disease.  Biopsy showed adenocarcinoma of lung origin.  We saw her prior to surgical evaluation and recommended proceeding with lobectomy and lymphadenectomy if  she was a surgical candidate.  She completed VATS with left lower lobectomy as well as chest wall mass resection and lymphadenectomy on 05/07/20.  Her tumor was 6.4 cm in greatest dimension with carcinoma in the specimen marked "chest wall" but with separation from tumor by connective tissue and lung parenchyma.  13 lymph node fragments were all negative for tumor.  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  Because of the T3N0 disease, we would consider adjuvant  chemotherapy, likely cisplatin and pemetrexed for 4 cycles.  We do also have the ALCHEMIST trial available, but unfortunately she is not a candidate due to her history of RA with MTX use.  She completed 4 cycles of cisplatin and Alimta with adequate tolerance.  CT chest at the conclusion of therapy reviewed and shows nothing of suspicion in the lung.  However, she does have a 1.4 cm adrenal nodule  which was not obviously present on pretherapy imaging.  We recommended PET/CT to assess but this shows no significant uptake in the left adrenal or other areas of concern.  We will continue observation with repeat CT in 3 months.     Here for follow-up.  No major changes, she is doing well overall.  Her only complaint is some swelling, this is in two locations, specifically in the anterior axilla on the front of the pectoralis muscle, and in the right upper abdomen.  The abdominal swelling is relatively new, only about 39-25 days old, but she is bothered by it because she believes it affects her weight and her appearance.  The anterior axillary mass has been present for months and is not changing.  Some occasional chest wall pain at the site of the VATS.  No other new symptoms, ECOG 1.       Review of Systems:  Constitutional: Positive for fatigue.   HENT: Negative.   Eyes: Negative.   Respiratory: Negative.   Cardiovascular: Negative.   Gastrointestinal: Negative.   Genitourinary: Negative.   Musculoskeletal: Negative.   Skin: Negative.   Neurological: Negative.   Endo/Heme/Allergies: Negative.   Psychiatric/Behavioral: Negative.   All other systems reviewed and are negative.       Allergies   Allergen Reactions    Yeast Nausea And Vomiting     Pt. Says she hasnt had issues with this in a long  time    Yeast-Related Products Nausea And Vomiting and Headaches     Pt STATED NOT ALLERGIC     Past Medical History:   Diagnosis Date    Arrhythmia 04/05/2020    PVCs.  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, frequent ventricular ectopy, runs nonsustained VT    Autoimmune disease ([place])     RA    Bronchogenic cancer of right lung ([place]) 04/28/2020    adenocarcinoma    Chronic obstructive pulmonary disease ([place])     uses inhalers    Chronic pain of left knee 06/25/2018    Coronary atherosclerosis     noted on chest CT     COVID-19 vaccine series completed 08/15/2019    Pfizer; 07/29/2019 and booster given 03/12/2020    GERD (gastroesophageal reflux disease)     no meds at this time    H/O cardiovascular stress test     Bruce protocol, low risk study, EF of 67% and normal LV function    H/O mammogram 07/28/2016    mammogram and Ultrasound of right breaset- benign finding- [facility]     Hearing loss     "only hears out of left ear"    Hearing loss of both ears     no hearing aids     HTN (hypertension), benign 01/30/2014    controlled w/med    Hypothyroid     on med for control    Menopause     Osteoarthritis     Osteoporosis     prolia     Poor historian     patient had trouble remembering names of meds and what they were for and had to repeat instructions for surgery several times     Rheumatoid arthritis(714.0) 01/30/2014    followed by Dr. Debbra Riding. 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 Kristopher Glee in past    COLONOSCOPY N/A 01/27/2018    COLONOSCOPY/BMI 26 performed by Azalia Bilis, MD at [facility] FLX DX W/COLLJ SPEC WHEN PFRMD  01/27/2018         CT NEEDLE BIOPSY LUNG PERCUTANEOUS  04/16/2020    CT NEEDLE BIOPSY LUNG PERCUTANEOUS 04/16/2020 [facility] CT SCAN    HEMORRHOID SURGERY      OTHER SURGICAL HISTORY  09/2012    sinus surgery-Dr. Mickle Mallory    OTHER SURGICAL HISTORY  01/27/2013    Thymoma-Dr. Nancie Neas    TONSILLECTOMY  81 yrs old    UPPER GASTROINTESTINAL ENDOSCOPY  2015    Gastric AVM txed with cautery-Dr. Orpah Melter     Family History   Problem Relation Age of Onset    Osteoarthritis Mother     Breast Cancer Neg Hx     Alzheimer's Disease Father      Social History     Socioeconomic History     Marital status: Married     Spouse name: Not on file  Number of children: Not on file    Years of education: Not on file    Highest education level: Not on file   Occupational History    Not on file   Tobacco Use    Smoking status: Former     Packs/day: 0.50     Years: 10.00     Pack years: 5.00     Types: Cigarettes     Start date: 07/08/1959     Quit date: 07/08/1983     Years since quitting: 38.1    Smokeless tobacco: Never    Tobacco comments:     Quit smoking: not regular when first started- 1pk/week   Substance and Sexual Activity    Alcohol use: No     Alcohol/week: 0.0 standard drinks    Drug use: No    Sexual activity: Not on file   Other Topics Concern    Not on file   Social History Narrative    Widowed since 06/29/21. 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     Social Determinants of Health     Financial Resource Strain: Not on file   Food Insecurity: Not on file   Transportation Needs: Not on file   Physical Activity: Not on file   Stress: Not on file   Social Connections: Not on file   Intimate Partner Violence: Not on file   Housing Stability: Not on file     Current Outpatient Medications   Medication Sig Dispense Refill    Cholecalciferol 100 MCG (4000 UT) TABS Take 1 tablet by mouth daily      Umeclidinium-Vilanterol (ANORO ELLIPTA) 62.5-25 MCG/ACT AEPB Inhale 1 puff into the lungs daily 1 each 11    folic acid (FOLVITE) 1 MG tablet Take 1 tablet by mouth daily 90 tablet 1    atorvastatin (LIPITOR) 20 MG tablet Take 1 tablet by mouth daily 90 tablet 1    levothyroxine (SYNTHROID) 100 MCG tablet TAKE 1 TABLET BY MOUTH DAILY BEFORE BREAKFAST 30 tablet 11    losartan (COZAAR) 100 MG tablet Take 1 tablet by mouth daily TAKE 1 TABLET BY MOUTH DAILY 90 tablet 2    albuterol sulfate HFA 108 (90 Base) MCG/ACT inhaler Inhale 1 puff into the lungs every 6 hours as needed      fluticasone (FLONASE) 50 MCG/ACT nasal spray 2 sprays by Nasal route daily       No current facility-administered medications for this visit.        OBJECTIVE:  BP (!) 143/88 (Site: Right Upper Arm, Position: Sitting, Cuff Size: Medium Adult)    Pulse 57    Temp 97.5 ??F (36.4 ??C) (Oral)    Resp 23    Ht 5' 7" (1.702 m)    Wt 149 lb 3.2 oz (67.7 kg)    SpO2 96%    BMI 23.37 kg/m??     Physical Exam:  Constitutional: Well developed, well nourished female in no acute distress, sitting comfortably in the exam room chair.    HEENT: Normocephalic and atraumatic. Sclerae anicteric. Neck supple without JVD. No thyromegaly present.    Lymph node   No palpable submandibular, cervical, supraclavicular lymph nodes.   Skin Warm and dry.  No bruising and no rash noted.  No erythema.  No pallor.    Respiratory Lungs are clear to auscultation bilaterally without wheezes, rales or rhonchi, normal air exchange without accessory muscle use.    CVS Normal rate, regular rhythm and normal S1 and S2.  No murmurs, gallops, or rubs.   Abdomen Soft, nontender and nondistended, normoactive bowel sounds.  No palpable mass.  No hepatosplenomegaly.   Neuro Grossly nonfocal with no obvious sensory or motor deficits.   MSK Normal range of motion in general.  No edema and no tenderness.   Psych Appropriate mood and affect.      Labs:  Recent Results (from the past 96 hour(s))   CBC with Auto Differential    Collection Time: 08/14/21 12:37 PM   Result Value Ref Range    WBC 10.0 4.3 - 11.1 K/uL    RBC 4.12 4.05 - 5.2 M/uL    Hemoglobin 13.2 11.7 - 15.4 g/dL    Hematocrit 40.1 35.8 - 46.3 %    MCV 97.3 82.0 - 102.0 FL    MCH 32.0 26.1 - 32.9 PG    MCHC 32.9 31.4 - 35.0 g/dL    RDW 13.1 11.9 - 14.6 %    Platelets 208 150 - 450 K/uL    MPV 9.7 9.4 - 12.3 FL    nRBC 0.00 0.0 - 0.2 K/uL    Differential Type AUTOMATED      Seg Neutrophils 72 43 - 78 %    Lymphocytes 19 13 - 44 %    Monocytes 7 4.0 - 12.0 %    Eosinophils % 1 0.5 - 7.8 %    Basophils 0 0.0 - 2.0 %    Immature Granulocytes 0 0.0 - 5.0 %    Segs Absolute 7.2 1.7 - 8.2 K/UL    Absolute Lymph # 1.9 0.5 - 4.6 K/UL    Absolute Mono #  0.7 0.1 - 1.3 K/UL    Absolute Eos # 0.1 0.0 - 0.8 K/UL    Basophils Absolute 0.0 0.0 - 0.2 K/UL    Absolute Immature Granulocyte 0.0 0.0 - 0.5 K/UL   Comprehensive Metabolic Panel    Collection Time: 08/14/21 12:37 PM   Result Value Ref Range    Sodium 139 133 - 143 mmol/L    Potassium 3.8 3.5 - 5.1 mmol/L    Chloride 107 101 - 110 mmol/L    CO2 28 21 - 32 mmol/L    Anion Gap 4 2 - 11 mmol/L    Glucose 100 65 - 100 mg/dL    BUN 20 8 - 23 MG/DL    Creatinine 1.50 (H) 0.6 - 1.0 MG/DL    Est, Glom Filt Rate 35 (L) >60 ml/min/1.18m    Calcium 9.4 8.3 - 10.4 MG/DL    Total Bilirubin 1.0 0.2 - 1.1 MG/DL    ALT 16 12 - 65 U/L    AST 13 (L) 15 - 37 U/L    Alk Phosphatase 47 (L) 50 - 136 U/L    Total Protein 7.6 6.3 - 8.2 g/dL    Albumin 3.7 3.2 - 4.6 g/dL    Globulin 3.9 2.8 - 4.5 g/dL    Albumin/Globulin Ratio 0.9 0.4 - 1.6         Imaging:  CT CHEST ABDOMEN PELVIS W CONTRAST    Result Date: 02/08/2021  EXAM: CT CHEST, ABDOMEN, AND PELVIS WITH CONTRAST INDICATION: Lung cancer. COMPARISON: PET/CT 11/08/2020, chest CT 10/22/2020 TECHNIQUE:  CT imaging was performed of the chest, abdomen, and pelvis after intravenous injection of 100 mL Isovue 370. Intravenous contrast was used for better evaluation of solid organs and vascular structures. Oral contrast was used for bowel opacification. Coronal reformatted imaging provided. Radiation dose reduction techniques were used for  this study. Our CT scanners use one or all of the following: Automated exposure control, adjustment of the mA and/or kV according to patient size, iterative reconstruction. FINDINGS: CHEST: Mediastinum and visualized thyroid: No lymphadenopathy. Heart: Multivessel coronary atherosclerotic calcifications. Large Vessels: Normal. Pleura: Normal. Lungs: Post surgical changes in the right lung. No recurrent tumor evident. Normal right lung hilum. Airways: Normal. ABDOMEN AND PELVIS: Motion limited evaluation of the abdomen Liver: Normal in size and morphology.  No  focal lesions. Gallbladder/biliary: Normal gallbladder.  No biliary dilatation. Pancreas: Normal. Spleen: Normal. Adrenals: Stable appearance of the left adrenal gland.. Kidneys: No focal lesion or hydronephrosis. Bladder: Normal. Pelvic organs: Normal uterus.  No adnexal mass. Gastrointestinal: Colonic diverticulosis.. Peritoneum/retroperitoneum: Normal. Lymph nodes: Normal. Vessels: Aortic atherosclerotic disease. Bones/Soft tissues: No aggressive appearing bone lesion.     Post surgical changes in the right lung without evidence of locally recurrent tumor or metastatic disease in the chest, abdomen, or pelvis.        ASSESSMENT:   Diagnosis Orders   1. Adenocarcinoma of right lung, stage 2 (HCC)  CT CHEST ABDOMEN PELVIS W CONTRAST Additional Contrast? None    CT CHEST ABDOMEN PELVIS W CONTRAST Additional Contrast? None              PLAN:  Lab studies were personally reviewed.    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  PET/CT shows no distant disease.  Clinically T3N0M0.  She completed VATS with left lower lobectomy as well as chest wall mass resection  and lymphadenectomy on 05/07/20.  She tolerated this reasonably well, still with some post-op tenderness but nothing unmanageable.  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  13 lymph node fragments were all negative for tumor.  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  We do also have the ALCHEMIST trial available, but unfortunately she is not a candidate due to her history of RA with MTX use.  She completed 4 cycles of cisplatin and Alimta with adequate tolerance.  CT  chest at the conclusion of  therapy reviewed and shows nothing of suspicion in the lung.  However, she does have a 1.4 cm adrenal nodule which was not obviously present on pretherapy imaging.  We recommended PET/CT to assess but this shows no significant uptake in the left adrenal or other areas of concern.  We will continue observation with repeat CT in 3 months.       Here for follow-up.  No major changes, she is doing well overall.  Her only complaint is some swelling, this is in two locations, specifically in the anterior axilla on the front of the pectoralis muscle, and in the right upper abdomen.  The abdominal swelling is relatively new, only about 75-39 days old, but she is bothered by it because she believes it affects her weight and her appearance.  The anterior axillary mass has been present for months and is not changing.  Some occasional chest wall pain at the site of the VATS.  No other new symptoms, ECOG 1.   Labs reviewed and unremarkable.  CT was not done prior to this visit, we will make  sure this is scheduled, my suspicion for pathology related to her two areas of swelling is very low, but imaging should help distinguish benign findings from suspicious areas.  Continue every 6 month follow-up with CT at least until early 2024 (2 years from adjuvant therapy completion).  She is comfortable with the plan.  All questions were asked and answered to the best of my ability.  F/u in 6 months or sooner if needed for any clinical symptoms.             Greer Ee, MD, MD  [facility] [place] [address]  Office : [phone]  Fax : [phone]

## 2021-08-14 NOTE — Patient Instructions (Addendum)
Patient Instructions from Today's Visit    Reason for Visit:  Follow up Lung Cancer     Diagnosis Information:  https://www.cancer.net/about-us/asco-answers-patient-education-materials/asco-answers-fact-sheets    Plan:  Lab results reviewed  CT scan soon    Follow Up:  Follow up in 2-3 weeks with labs prior    Recent Lab Results:  Hospital Outpatient Visit on 08/14/2021   Component Date Value Ref Range Status    WBC 08/14/2021 10.0  4.3 - 11.1 K/uL Final    RBC 08/14/2021 4.12  4.05 - 5.2 M/uL Final    Hemoglobin 08/14/2021 13.2  11.7 - 15.4 g/dL Final    Hematocrit 08/14/2021 40.1  35.8 - 46.3 % Final    MCV 08/14/2021 97.3  82.0 - 102.0 FL Final    MCH 08/14/2021 32.0  26.1 - 32.9 PG Final    MCHC 08/14/2021 32.9  31.4 - 35.0 g/dL Final    RDW 08/14/2021 13.1  11.9 - 14.6 % Final    Platelets 08/14/2021 208  150 - 450 K/uL Final    MPV 08/14/2021 9.7  9.4 - 12.3 FL Final    nRBC 08/14/2021 0.00  0.0 - 0.2 K/uL Final    **Note: Absolute NRBC parameter is now reported with Hemogram**    Differential Type 08/14/2021 AUTOMATED    Final    Seg Neutrophils 08/14/2021 72  43 - 78 % Final    Lymphocytes 08/14/2021 19  13 - 44 % Final    Monocytes 08/14/2021 7  4.0 - 12.0 % Final    Eosinophils % 08/14/2021 1  0.5 - 7.8 % Final    Basophils 08/14/2021 0  0.0 - 2.0 % Final    Immature Granulocytes 08/14/2021 0  0.0 - 5.0 % Final    Segs Absolute 08/14/2021 7.2  1.7 - 8.2 K/UL Final    Absolute Lymph # 08/14/2021 1.9  0.5 - 4.6 K/UL Final    Absolute Mono # 08/14/2021 0.7  0.1 - 1.3 K/UL Final    Absolute Eos # 08/14/2021 0.1  0.0 - 0.8 K/UL Final    Basophils Absolute 08/14/2021 0.0  0.0 - 0.2 K/UL Final    Absolute Immature Granulocyte 08/14/2021 0.0  0.0 - 0.5 K/UL Final     Treatment Summary has been discussed and given to patient: N/A    -------------------------------------------------------------------------------------------------------------------  Please call our office at [phone] if you have any  of the  following symptoms:   Fever of 100.5 or greater  Chills  Shortness of breath  Swelling or pain in one leg    After office hours an answering service is available and will contact a provider for emergencies or if you are experiencing any of the above symptoms.    Patient does express an interest in My Chart.  My Chart log in information explained on the after visit summary printout at the [place] desk.    Edmonia James, RN

## 2021-08-19 MED ORDER — LOSARTAN POTASSIUM 100 MG PO TABS
100 MG | ORAL_TABLET | Freq: Every day | ORAL | 2 refills | Status: AC
Start: 2021-08-19 — End: 2022-06-25

## 2021-08-29 ENCOUNTER — Inpatient Hospital Stay: Admit: 2021-08-29 | Payer: MEDICARE | Primary: Internal Medicine

## 2021-08-29 DIAGNOSIS — C3491 Malignant neoplasm of unspecified part of right bronchus or lung: Secondary | ICD-10-CM

## 2021-08-29 MED ORDER — IOPAMIDOL 76 % IV SOLN
76 % | Freq: Once | INTRAVENOUS | Status: AC | PRN
Start: 2021-08-29 — End: 2021-08-29
  Administered 2021-08-29: 15:00:00 100 mL via INTRAVENOUS

## 2021-08-29 MED ORDER — DIATRIZOATE MEGLUMINE & SODIUM 66-10 % PO SOLN
66-10 % | Freq: Once | ORAL | Status: DC | PRN
Start: 2021-08-29 — End: 2021-09-02
  Administered 2021-08-29: 15:00:00 15 mL via ORAL

## 2021-09-04 ENCOUNTER — Encounter

## 2021-09-06 ENCOUNTER — Encounter: Admit: 2021-09-06 | Discharge: 2021-09-06 | Payer: MEDICARE | Attending: Internal Medicine | Primary: Internal Medicine

## 2021-09-06 ENCOUNTER — Inpatient Hospital Stay: Admit: 2021-09-06 | Payer: MEDICARE | Primary: Internal Medicine

## 2021-09-06 DIAGNOSIS — C3491 Malignant neoplasm of unspecified part of right bronchus or lung: Secondary | ICD-10-CM

## 2021-09-06 LAB — COMPREHENSIVE METABOLIC PANEL
ALT: 20 U/L (ref 12–65)
AST: 19 U/L (ref 15–37)
Albumin/Globulin Ratio: 0.9 (ref 0.4–1.6)
Albumin: 3.7 g/dL (ref 3.2–4.6)
Alk Phosphatase: 48 U/L — ABNORMAL LOW (ref 50–136)
Anion Gap: 3 mmol/L (ref 2–11)
BUN: 28 MG/DL — ABNORMAL HIGH (ref 8–23)
CO2: 29 mmol/L (ref 21–32)
Calcium: 9.5 MG/DL (ref 8.3–10.4)
Chloride: 106 mmol/L (ref 101–110)
Creatinine: 1.4 MG/DL — ABNORMAL HIGH (ref 0.6–1.0)
Est, Glom Filt Rate: 38 mL/min/{1.73_m2} — ABNORMAL LOW (ref >60)
Globulin: 4 g/dL (ref 2.8–4.5)
Glucose: 106 mg/dL — ABNORMAL HIGH (ref 65–100)
Potassium: 4.1 mmol/L (ref 3.5–5.1)
Sodium: 138 mmol/L (ref 133–143)
Total Bilirubin: 0.7 MG/DL (ref 0.2–1.1)
Total Protein: 7.7 g/dL (ref 6.3–8.2)

## 2021-09-06 LAB — CBC WITH AUTO DIFFERENTIAL
Absolute Eos #: 0.1 10*3/uL (ref 0.0–0.8)
Absolute Immature Granulocyte: 0 10*3/uL (ref 0.0–0.5)
Absolute Lymph #: 2.1 10*3/uL (ref 0.5–4.6)
Absolute Mono #: 0.6 10*3/uL (ref 0.1–1.3)
Basophils Absolute: 0.1 10*3/uL (ref 0.0–0.2)
Basophils: 1 % (ref 0.0–2.0)
Eosinophils %: 2 % (ref 0.5–7.8)
Hematocrit: 42.6 % (ref 35.8–46.3)
Hemoglobin: 14 g/dL (ref 11.7–15.4)
Immature Granulocytes: 0 % (ref 0.0–5.0)
Lymphocytes: 25 % (ref 13–44)
MCH: 32 PG (ref 26.1–32.9)
MCHC: 32.9 g/dL (ref 31.4–35.0)
MCV: 97.3 FL (ref 82.0–102.0)
MPV: 9.7 FL (ref 9.4–12.3)
Monocytes: 7 % (ref 4.0–12.0)
Platelets: 204 10*3/uL (ref 150–450)
RBC: 4.38 M/uL (ref 4.05–5.2)
RDW: 12.9 % (ref 11.9–14.6)
Seg Neutrophils: 65 % (ref 43–78)
Segs Absolute: 5.4 10*3/uL (ref 1.7–8.2)
WBC: 8.4 10*3/uL (ref 4.3–11.1)
nRBC: 0 10*3/uL (ref 0.0–0.2)

## 2021-09-06 NOTE — Patient Instructions (Signed)
Patient Instructions from Today's Visit    Reason for Visit:  Follow up Lung Cancer    Diagnosis Information:  https://www.cancer.net/about-us/asco-answers-patient-education-materials/asco-answers-fact-sheets    Plan:  Lab results discussed     Follow Up:  Follow up in with labs prior    Recent Lab Results:  Hospital Outpatient Visit on 09/06/2021   Component Date Value Ref Range Status    WBC 09/06/2021 8.4  4.3 - 11.1 K/uL Final    RBC 09/06/2021 4.38  4.05 - 5.2 M/uL Final    Hemoglobin 09/06/2021 14.0  11.7 - 15.4 g/dL Final    Hematocrit 09/06/2021 42.6  35.8 - 46.3 % Final    MCV 09/06/2021 97.3  82.0 - 102.0 FL Final    MCH 09/06/2021 32.0  26.1 - 32.9 PG Final    MCHC 09/06/2021 32.9  31.4 - 35.0 g/dL Final    RDW 09/06/2021 12.9  11.9 - 14.6 % Final    Platelets 09/06/2021 204  150 - 450 K/uL Final    MPV 09/06/2021 9.7  9.4 - 12.3 FL Final    nRBC 09/06/2021 0.00  0.0 - 0.2 K/uL Final    **Note: Absolute NRBC parameter is now reported with Hemogram**    Differential Type 09/06/2021 AUTOMATED    Final    Seg Neutrophils 09/06/2021 65  43 - 78 % Final    Lymphocytes 09/06/2021 25  13 - 44 % Final    Monocytes 09/06/2021 7  4.0 - 12.0 % Final    Eosinophils % 09/06/2021 2  0.5 - 7.8 % Final    Basophils 09/06/2021 1  0.0 - 2.0 % Final    Immature Granulocytes 09/06/2021 0  0.0 - 5.0 % Final    Segs Absolute 09/06/2021 5.4  1.7 - 8.2 K/UL Final    Absolute Lymph # 09/06/2021 2.1  0.5 - 4.6 K/UL Final    Absolute Mono # 09/06/2021 0.6  0.1 - 1.3 K/UL Final    Absolute Eos # 09/06/2021 0.1  0.0 - 0.8 K/UL Final    Basophils Absolute 09/06/2021 0.1  0.0 - 0.2 K/UL Final    Absolute Immature Granulocyte 09/06/2021 0.0  0.0 - 0.5 K/UL Final    Sodium 09/06/2021 138  133 - 143 mmol/L Final    Potassium 09/06/2021 4.1  3.5 - 5.1 mmol/L Final    Chloride 09/06/2021 106  101 - 110 mmol/L Final    CO2 09/06/2021 29  21 - 32 mmol/L Final    Anion Gap 09/06/2021 3  2 - 11 mmol/L Final    Glucose 09/06/2021 106 (A)  65 -  100 mg/dL Final    BUN 09/06/2021 28 (A)  8 - 23 MG/DL Final    Creatinine 09/06/2021 1.40 (A)  0.6 - 1.0 MG/DL Final    Est, Glom Filt Rate 09/06/2021 38 (A)  >60 ml/min/1.53m Final    Comment:      Pediatric calculator link: https://www.kidney.org/professionals/kdoqi/gfr_calculatorped       These results are not intended for use in patients <170years of age.       eGFR results are calculated without a race factor using  the 2021 CKD-EPI equation. Careful clinical correlation is recommended, particularly when comparing to results calculated using previous equations.  The CKD-EPI equation is less accurate in patients with extremes of muscle mass, extra-renal metabolism of creatinine, excessive creatine ingestion, or following therapy that affects renal tubular secretion.      Calcium 09/06/2021 9.5  8.3 - 10.4 MG/DL Final  Total Bilirubin 09/06/2021 0.7  0.2 - 1.1 MG/DL Final    ALT 09/06/2021 20  12 - 65 U/L Final    AST 09/06/2021 19  15 - 37 U/L Final    Alk Phosphatase 09/06/2021 48 (A)  50 - 136 U/L Final    Total Protein 09/06/2021 7.7  6.3 - 8.2 g/dL Final    Albumin 09/06/2021 3.7  3.2 - 4.6 g/dL Final    Globulin 09/06/2021 4.0  2.8 - 4.5 g/dL Final    Albumin/Globulin Ratio 09/06/2021 0.9  0.4 - 1.6   Final     Treatment Summary has been discussed and given to patient: N/A    -------------------------------------------------------------------------------------------------------------------  Please call our office at [phone] if you have any  of the following symptoms:   Fever of 100.5 or greater  Chills  Shortness of breath  Swelling or pain in one leg    After office hours an answering service is available and will contact a provider for emergencies or if you are experiencing any of the above symptoms.    Patient does express an interest in My Chart.  My Chart log in information explained on the after visit summary printout at the [place] desk.    Edmonia James, RN

## 2021-09-06 NOTE — Progress Notes (Signed)
Audrey Snow: Office Visit Established Patient    Chief Complaint:    Chief Complaint   Patient presents with    Follow-up         History of Present Illness:  Audrey Snow is a 81 y.o. female who returns today for management of adenocarcinoma of the  lung.  On 02/27/20, Audrey Snow presented to [facility] ED with complaints of achiness in chest with radiation of pain to her left upper extremity with association of N/V, SOB and diaphoresis. Cardiac work-up was negative; however her chest x-ray reported a 5.1 cm rounded opacity at the right lung base.  She saw her PCP on 02/29/20, CT scan was ordered and reported a 5 cm mass with adjacent pleural nodularity posteriorly at the right lung base and considered suspicious for bronchogenic carcinoma. On 04/02/20 she  had a PET/CT scan that reported a 6 cm right lower lobe pulmonary mass with no metastatic disease.  Biopsy showed adenocarcinoma of lung origin.  We saw her prior to surgical evaluation and recommended proceeding with lobectomy and lymphadenectomy if  she was a surgical candidate.  She completed VATS with left lower lobectomy as well as chest wall mass resection and lymphadenectomy on 05/07/20.  Her tumor was 6.4 cm in greatest dimension with carcinoma in the specimen marked "chest wall" but with separation from tumor by connective tissue and lung parenchyma.  13 lymph node fragments were all negative for tumor.  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  Because of the T3N0 disease, we would consider adjuvant  chemotherapy, likely cisplatin and pemetrexed for 4 cycles.  We do also have the ALCHEMIST trial available, but unfortunately she is not a candidate due to her history of RA with MTX use.  She completed 4 cycles of cisplatin and Alimta with adequate tolerance.  CT chest at the conclusion of therapy reviewed and shows nothing of suspicion in the lung.  However, she does have a 1.4 cm adrenal nodule  which was not obviously present on pretherapy imaging.  We recommended PET/CT to assess but this shows no significant uptake in the left adrenal or other areas of concern.  We will continue observation with repeat CT in 3 months.     Here for follow-up and restaging.  No major changes, she is doing well overall.  She continues to have some swelling that comes and goes in associated with her chest wall incision.  No other new symptoms.       Review of Systems:  Constitutional: Positive for fatigue.   HENT: Negative.   Eyes: Negative.   Respiratory: Negative.   Cardiovascular: Negative.   Gastrointestinal: Negative.   Genitourinary: Negative.   Musculoskeletal: Positive for swelling.   Skin: Negative.   Neurological: Negative.   Endo/Heme/Allergies: Negative.   Psychiatric/Behavioral: Negative.   All other systems reviewed and are negative.       Allergies   Allergen Reactions    Yeast Nausea And Vomiting     Pt. Says she hasnt had issues with this in a long time    Yeast-Related Products Nausea And Vomiting and Headaches     Pt STATED NOT ALLERGIC     Past Medical History:   Diagnosis Date    Arrhythmia 04/05/2020    PVCs.  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, frequent ventricular ectopy, runs nonsustained VT    Autoimmune disease ([place])     RA    Bronchogenic cancer of right lung ([place]) 04/28/2020    adenocarcinoma    Chronic obstructive pulmonary disease ([place])     uses inhalers    Chronic pain of left knee 06/25/2018    Coronary atherosclerosis     noted on chest CT    COVID-19 vaccine series completed 08/15/2019    Pfizer; 07/29/2019 and booster given 03/12/2020    GERD (gastroesophageal reflux disease)     no meds at this time    H/O cardiovascular stress test     Bruce protocol, low risk study, EF of 67% and normal LV function    H/O mammogram 07/28/2016    mammogram and Ultrasound of right breaset- benign  finding- [facility]     Hearing loss     "only hears out of left ear"    Hearing loss of both ears     no hearing aids     HTN (hypertension), benign 01/30/2014    controlled w/med    Hypothyroid     on med for control    Menopause     Osteoarthritis     Osteoporosis     prolia     Poor historian     patient had trouble remembering names of meds and what they were for and had to repeat instructions for surgery several times     Rheumatoid arthritis(714.0) 01/30/2014    followed by Dr. Debbra Riding. 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 Kristopher Glee in past    COLONOSCOPY N/A 01/27/2018    COLONOSCOPY/BMI 26 performed by Azalia Bilis, MD at [place] FLX DX W/COLLJ SPEC WHEN PFRMD  01/27/2018         CT NEEDLE BIOPSY LUNG PERCUTANEOUS  04/16/2020    CT NEEDLE BIOPSY LUNG PERCUTANEOUS 04/16/2020 [facility] CT SCAN    HEMORRHOID SURGERY      OTHER SURGICAL HISTORY  09/2012    sinus surgery-Dr. Mickle Mallory    OTHER SURGICAL HISTORY  01/27/2013    Thymoma-Dr. Nancie Neas    TONSILLECTOMY  81 yrs old    UPPER GASTROINTESTINAL ENDOSCOPY  2015    Gastric AVM txed with cautery-Dr. Orpah Melter     Family History   Problem Relation Age of Onset    Osteoarthritis Mother     Breast Cancer Neg Hx     Alzheimer's Disease Father      Social History     Socioeconomic History    Marital status: Widowed     Spouse name: Not on file    Number of children: Not on file    Years of education: Not on file    Highest education level: Not on file   Occupational History    Not on file   Tobacco Use    Smoking status: Former     Packs/day: 0.50     Years: 10.00     Pack years: 5.00  Types: Cigarettes     Start date: 07/08/1959      Quit date: 07/08/1983     Years since quitting: 38.1    Smokeless tobacco: Never    Tobacco comments:     Quit smoking: not regular when first started- 1pk/week   Substance and Sexual Activity    Alcohol use: No     Alcohol/week: 0.0 standard drinks    Drug use: No    Sexual activity: Not on file   Other Topics Concern    Not on file   Social History Narrative    Widowed since 06/29/21. 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     Social Determinants of Health     Financial Resource Strain: Not on file   Food Insecurity: Not on file   Transportation Needs: Not on file   Physical Activity: Not on file   Stress: Not on file   Social Connections: Not on file   Intimate Partner Violence: Not on file   Housing Stability: Not on file     Current Outpatient Medications   Medication Sig Dispense Refill    methotrexate Sodium (RHEUMATREX) 250 MG/10ML SOLN chemo injection Inject 20 mg into the skin once a week      losartan (COZAAR) 100 MG tablet Take 1 tablet by mouth daily TAKE 1 TABLET BY MOUTH DAILY 90 tablet 2    Cholecalciferol 100 MCG (4000 UT) TABS Take 1 tablet by mouth daily      folic acid (FOLVITE) 1 MG tablet Take 1 tablet by mouth daily 90 tablet 1    atorvastatin (LIPITOR) 20 MG tablet Take 1 tablet by mouth daily 90 tablet 1    levothyroxine (SYNTHROID) 100 MCG tablet TAKE 1 TABLET BY MOUTH DAILY BEFORE BREAKFAST 30 tablet 11    fluticasone (FLONASE) 50 MCG/ACT nasal spray 2 sprays by Nasal route daily PRN      Umeclidinium-Vilanterol (ANORO ELLIPTA) 62.5-25 MCG/ACT AEPB Inhale 1 puff into the lungs daily (Patient not taking: Reported on 09/06/2021) 1 each 11    albuterol sulfate HFA 108 (90 Base) MCG/ACT inhaler Inhale 1 puff into the lungs every 6 hours as needed (Patient not taking: Reported on 09/06/2021)       No current facility-administered medications for this visit.       OBJECTIVE:  BP 95/61 (Site: Left Upper Arm, Position: Standing)    Pulse 71    Temp 97.4 ??F (36.3 ??C)    Resp 14    Ht '5\' 7"'  (1.702 m)     Wt 152 lb (68.9 kg)    SpO2 96%    BMI 23.81 kg/m??     Physical Exam:  Constitutional: Well developed, well nourished female in no acute distress, sitting comfortably in the exam room chair.    HEENT: Normocephalic and atraumatic. Sclerae anicteric. Neck supple without JVD. No thyromegaly present.    Lymph node   No palpable submandibular, cervical, supraclavicular lymph nodes.   Skin Warm and dry.  No bruising and no rash noted.  No erythema.  No pallor.    Respiratory Lungs are clear to auscultation bilaterally without wheezes, rales or rhonchi, normal air exchange without accessory muscle use.    CVS Normal rate, regular rhythm and normal S1 and S2.  No murmurs, gallops, or rubs.   Abdomen Soft, nontender and nondistended, normoactive bowel sounds.  No palpable mass.  No hepatosplenomegaly.   Neuro Grossly nonfocal with no obvious sensory or motor deficits.   MSK Normal  range of motion in general.  No edema and no tenderness.   Psych Appropriate mood and affect.      Labs:  Recent Results (from the past 96 hour(s))   CBC with Auto Differential    Collection Time: 09/06/21 10:04 AM   Result Value Ref Range    WBC 8.4 4.3 - 11.1 K/uL    RBC 4.38 4.05 - 5.2 M/uL    Hemoglobin 14.0 11.7 - 15.4 g/dL    Hematocrit 42.6 35.8 - 46.3 %    MCV 97.3 82.0 - 102.0 FL    MCH 32.0 26.1 - 32.9 PG    MCHC 32.9 31.4 - 35.0 g/dL    RDW 12.9 11.9 - 14.6 %    Platelets 204 150 - 450 K/uL    MPV 9.7 9.4 - 12.3 FL    nRBC 0.00 0.0 - 0.2 K/uL    Differential Type AUTOMATED      Seg Neutrophils 65 43 - 78 %    Lymphocytes 25 13 - 44 %    Monocytes 7 4.0 - 12.0 %    Eosinophils % 2 0.5 - 7.8 %    Basophils 1 0.0 - 2.0 %    Immature Granulocytes 0 0.0 - 5.0 %    Segs Absolute 5.4 1.7 - 8.2 K/UL    Absolute Lymph # 2.1 0.5 - 4.6 K/UL    Absolute Mono # 0.6 0.1 - 1.3 K/UL    Absolute Eos # 0.1 0.0 - 0.8 K/UL    Basophils Absolute 0.1 0.0 - 0.2 K/UL    Absolute Immature Granulocyte 0.0 0.0 - 0.5 K/UL   Comprehensive Metabolic Panel     Collection Time: 09/06/21 10:04 AM   Result Value Ref Range    Sodium 138 133 - 143 mmol/L    Potassium 4.1 3.5 - 5.1 mmol/L    Chloride 106 101 - 110 mmol/L    CO2 29 21 - 32 mmol/L    Anion Gap 3 2 - 11 mmol/L    Glucose 106 (H) 65 - 100 mg/dL    BUN 28 (H) 8 - 23 MG/DL    Creatinine 1.40 (H) 0.6 - 1.0 MG/DL    Est, Glom Filt Rate 38 (L) >60 ml/min/1.55m    Calcium 9.5 8.3 - 10.4 MG/DL    Total Bilirubin 0.7 0.2 - 1.1 MG/DL    ALT 20 12 - 65 U/L    AST 19 15 - 37 U/L    Alk Phosphatase 48 (L) 50 - 136 U/L    Total Protein 7.7 6.3 - 8.2 g/dL    Albumin 3.7 3.2 - 4.6 g/dL    Globulin 4.0 2.8 - 4.5 g/dL    Albumin/Globulin Ratio 0.9 0.4 - 1.6         Imaging:  CT CHEST ABDOMEN PELVIS W CONTRAST Additional Contrast? None    Result Date: 08/29/2021  CT CHEST ABDOMEN PELVIS W CONTRAST 08/29/2021 10:26 AM HISTORY: Follow-up adenocarcinoma of the right lung. Palpable abnormality right axilla and right abdomen. COMPARISON: CT chest abdomen pelvis 02/08/2021. TECHNIQUE: Multiple axial images were obtained through the chest, abdomen, and pelvis.  Oral contrast was used for bowel opacification.  100 mL of Isovue 370 intravenous contrast was used for better evaluation of solid organs and vascular structures.  Radiation dose reduction techniques were used for this study.  All CT scans performed at this facility use one or all of the following: Automated exposure control, adjustment of the mA and/or kVp  according to patient's size, iterative reconstruction. FINDINGS: LUNGS AND PLEURA: Centrilobular emphysema. Volume loss right hemithorax consistent with partial right lung resection. No evidence of recurrent mass. No pleural effusions. MEDIASTINUM/AXILLAE: Heart is enlarged. No pericardial effusion. Esophagus is unremarkable. Thyroid gland not visualized. No enlarged lymph nodes. Scattered coronary artery calcifications are present. HEPATOBILIARY: Normal. PANCREAS: Normal. SPLEEN: Normal. ADRENAL GLANDS: Normal. KIDNEYS/BLADDER:  Kidneys and bladder are unremarkable. No hydronephrosis or renal calculi. BOWEL: No bowel obstruction. Appendix is normal. LYMPH NODES: No enlarged lymph nodes. VASCULATURE: Calcified and noncalcified plaque abdominal aorta without significant aneurysmal dilatation or dissection. PELVIC ORGANS: Uterus and rectum are unremarkable. No free pelvic fluid. No pelvic mass. MUSCULOSKELETAL: Degenerative spine changes. No destructive bone lesions are appreciated. No abnormality noted in the area of patient's palpable lesions right axilla on image 25 of series 2 and in the anterior right abdominal wall on image 73.     1. No evidence of recurrent or metastatic disease in the chest, abdomen or pelvis. Changes of partial right lung resection. 2. No CT findings in the area of patient's palpable abnormalities right axilla and right anterior lateral abdominal wall.           CT CHEST ABDOMEN PELVIS W CONTRAST    Result Date: 02/08/2021  EXAM: CT CHEST, ABDOMEN, AND PELVIS WITH CONTRAST INDICATION: Lung cancer. COMPARISON: PET/CT 11/08/2020, chest CT 10/22/2020 TECHNIQUE:  CT imaging was performed of the chest, abdomen, and pelvis after intravenous injection of 100 mL Isovue 370. Intravenous contrast was used for better evaluation of solid organs and vascular structures. Oral contrast was used for bowel opacification. Coronal reformatted imaging provided. Radiation dose reduction techniques were used for this study. Our CT scanners use one or all of the following: Automated exposure control, adjustment of the mA and/or kV according to patient size, iterative reconstruction. FINDINGS: CHEST: Mediastinum and visualized thyroid: No lymphadenopathy. Heart: Multivessel coronary atherosclerotic calcifications. Large Vessels: Normal. Pleura: Normal. Lungs: Post surgical changes in the right lung. No recurrent tumor evident. Normal right lung hilum. Airways: Normal. ABDOMEN AND PELVIS: Motion limited evaluation of the abdomen Liver: Normal in  size and morphology.  No focal lesions. Gallbladder/biliary: Normal gallbladder.  No biliary dilatation. Pancreas: Normal. Spleen: Normal. Adrenals: Stable appearance of the left adrenal gland.. Kidneys: No focal lesion or hydronephrosis. Bladder: Normal. Pelvic organs: Normal uterus.  No adnexal mass. Gastrointestinal: Colonic diverticulosis.. Peritoneum/retroperitoneum: Normal. Lymph nodes: Normal. Vessels: Aortic atherosclerotic disease. Bones/Soft tissues: No aggressive appearing bone lesion.     Post surgical changes in the right lung without evidence of locally recurrent tumor or metastatic disease in the chest, abdomen, or pelvis.        ASSESSMENT:   Diagnosis Orders   1. Adenocarcinoma of right lung, stage 2 ([facility])  CT CHEST ABDOMEN PELVIS W CONTRAST Additional Contrast? Oral                PLAN:  Lab studies were personally reviewed.    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  PET/CT shows no distant disease.  Clinically T3N0M0.  She completed VATS with left lower lobectomy as well as chest wall mass resection  and lymphadenectomy on 05/07/20.  She tolerated this reasonably well, still with some post-op tenderness but nothing unmanageable.  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  13 lymph node  fragments were all negative for tumor.  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  We do also have the ALCHEMIST trial available, but unfortunately she is not a candidate due to her history of RA with MTX use.  She completed 4 cycles of cisplatin and Alimta with adequate tolerance.  CT  chest at the conclusion of therapy reviewed and shows nothing of  suspicion in the lung.  However, she does have a 1.4 cm adrenal nodule which was not obviously present on pretherapy imaging.  We recommended PET/CT to assess but this shows no significant uptake in the left adrenal or other areas of concern.  We will continue observation with repeat CT in 3 months.     Here for follow-up and restaging.  No major changes, she is doing well overall.  She continues to have some swelling that comes and goes in associated with her chest wall incision.  No other new symptoms.  Labs reviewed and unremarkable.  CT reviewed and shows no evidence of disease, there is no pathology noted at the resection site where she is experiencing swelling with special attention by radiology.  Continue every 6 month follow-up with CT at least until early 2024 (2 years from adjuvant therapy completion).  She is comfortable with the plan.  All questions were asked and answered to the best of my ability.  F/u in 6 months with CT or sooner if needed for any clinical symptoms.             Greer Ee, MD  [facility] [place] [address]  Office : [phone]  Fax : [phone]

## 2021-10-15 ENCOUNTER — Telehealth: Admit: 2021-10-15 | Discharge: 2021-10-15 | Payer: MEDICARE | Attending: Family | Primary: Internal Medicine

## 2021-10-15 DIAGNOSIS — Z0000 Encounter for general adult medical examination without abnormal findings: Secondary | ICD-10-CM

## 2021-10-15 NOTE — Patient Instructions (Signed)
Learning About Dental Care for Older Adults  Dental care for older adults: Overview  Dental care for older people is much the same as for younger adults. But older adults do have concerns that younger adults do not. Older adults may have problems with gum disease and decay on the roots of their teeth. They may need missing teeth replaced or broken fillings fixed. Or they may have dentures that need to be cared for. Some older adults may have trouble holding a toothbrush.  You can help remind the person you are caring for to brush and floss their teeth or to clean their dentures. In some cases, you may need to do the brushing and other dental care tasks. People who have trouble using their hands or who have dementia may need this extra help.  How can you help with dental care?  Normal dental care  To keep the teeth and gums healthy:  Brush the teeth with fluoride toothpaste twice a day--in the morning and at night--and floss at least once a day. Plaque can quickly build up on the teeth of older adults.  Watch for the signs of gum disease. These signs include gums that bleed after brushing or after eating hard foods, such as apples.  See a dentist regularly. Many experts recommend checkups every 6 months.  Keep the dentist up to date on any new medications the person is taking.  Encourage a balanced diet that includes whole grains, vegetables, and fruits, and that is low in saturated fat and sodium.  Encourage the person you're caring for not to use tobacco products. They can affect dental and general health.  Many older adults have a fixed income and feel that they can't afford dental care. But most towns and cities have programs in which dentists help older adults by lowering fees. Contact your area's public health offices or social services for information about dental care in your area.  Using a toothbrush  Older adults with arthritis sometimes have trouble brushing their teeth because they can't easily hold  the toothbrush. Their hands and fingers may be stiff, painful, or weak. If this is the case, you can:  Offer an IT trainer toothbrush.  Enlarge the handle of a non-electric toothbrush by wrapping a sponge, an elastic bandage, or adhesive tape around it.  Push the toothbrush handle through a ball made of rubber or soft foam.  Make the handle longer and thicker by taping Popsicle sticks or tongue depressors to it.  You may also be able to buy special toothbrushes, toothpaste dispensers, and floss holders.  Your doctor may recommend a soft-bristle toothbrush if the person you care for bleeds easily. Bleeding can happen because of a health problem or from certain medicines.  A toothpaste for sensitive teeth may help if the person you care for has sensitive teeth.  How do you brush and floss someone's teeth?  If the person you are caring for has a hard time cleaning their teeth on their own, you may need to brush and floss their teeth for them. It may be easiest to have the person sit and face away from you, and to sit or stand behind them. That way you can steady their head against your arm as you reach around to floss and brush their teeth. Choose a place that has good lighting and is comfortable for both of you.  Before you begin, gather your supplies. You will need gloves, floss, a toothbrush, and a container to hold water if you  are not near a sink. Wash and dry your hands well and put on gloves. Start by flossing:  Gently work a piece of floss between each of the teeth toward the gums. A plastic flossing tool may make this easier, and they are available at most drugstores.  Curve the floss around each tooth into a U-shape and gently slide it under the gum line.  Move the floss firmly up and down several times to scrape off the plaque.  After you've finished flossing, throw away the used floss and begin brushing:  Wet the brush and apply toothpaste.  Place the brush at a 45-degree angle where the teeth meet the gums.  Press firmly, and move the brush in small circles over the surface of the teeth.  Be careful not to brush too hard. Vigorous brushing can make the gums pull away from the teeth and can scratch the tooth enamel.  Brush all surfaces of the teeth, on the tongue side and on the cheek side. Pay special attention to the front teeth and all surfaces of the back teeth.  Brush chewing surfaces with short back-and-forth strokes.  After you've finished, help the person rinse the remaining toothpaste from their mouth.  Where can you learn more?  Go to https://www.bennett.info/ and enter F944 to learn more about "[place] for Older Adults."  Current as of: November 14, 2022Content Version: 13.6   2006-2023 Healthwise, Incorporated.   Care instructions adapted under license by [facility]. If you have questions about a medical condition or this instruction, always ask your healthcare professional. [place] any warranty or liability for your use of this information.           A Healthy Heart: Care Instructions  Your Care Instructions     Coronary artery disease, also called heart disease, occurs when a substance called plaque builds up in the vessels that supply oxygen-rich blood to your heart muscle. This can narrow the blood vessels and reduce blood flow. A heart attack happens when blood flow is completely blocked. A high-fat diet, smoking, and other factors increase the risk of heart disease.  Your doctor has found that you have a chance of having heart disease. You can do lots of things to keep your heart healthy. It may not be easy, but you can change your diet, exercise more, and quit smoking. These steps really work to lower your chance of heart disease.  Follow-up care is a key part of your treatment and safety. Be sure to make and go to all appointments, and call your doctor if you are having problems. It's also a good idea to know your test results  and keep a list of the medicines you take.  How can you care for yourself at home?  Diet   Use less salt when you cook and eat. This helps lower your blood pressure. Taste food before salting. Add only a little salt when you think you need it. With time, your taste buds will adjust to less salt.    Eat fewer snack items, fast foods, canned soups, and other high-salt, high-fat, processed foods.    Read food labels and try to avoid saturated and trans fats. They increase your risk of heart disease by raising cholesterol levels.    Limit the amount of solid fat-butter, margarine, and shortening-you eat. Use olive, peanut, or canola oil when you cook. Bake, broil, and steam foods instead of frying them.    Eat a variety  of fruit and vegetables every day. Dark green, deep orange, red, or yellow fruits and vegetables are especially good for you. Examples include spinach, carrots, peaches, and berries.    Foods high in fiber can reduce your cholesterol and provide important vitamins and minerals. High-fiber foods include whole-grain cereals and breads, oatmeal, beans, brown rice, citrus fruits, and apples.    Eat lean proteins. Heart-healthy proteins include seafood, lean meats and poultry, eggs, beans, peas, nuts, seeds, and soy products.    Limit drinks and foods with added sugar. These include candy, desserts, and soda pop.   Lifestyle changes   If your doctor recommends it, get more exercise. Walking is a good choice. Bit by bit, increase the amount you walk every day. Try for at least 30 minutes on most days of the week. You also may want to swim, bike, or do other activities.    Do not smoke. If you need help quitting, talk to your doctor about stop-smoking programs and medicines. These can increase your chances of quitting for good. Quitting smoking may be the most important step you can take to protect your heart. It is never too late to quit.    Limit alcohol to 2 drinks a day for men and 1 drink a day  for women. Too much alcohol can cause health problems.    Manage other health problems such as diabetes, high blood pressure, and high cholesterol. If you think you may have a problem with alcohol or drug use, talk to your doctor.   Medicines   Take your medicines exactly as prescribed. Call your doctor if you think you are having a problem with your medicine.    If your doctor recommends aspirin, take the amount directed each day. Make sure you take aspirin and not another kind of pain reliever, such as acetaminophen (Tylenol).   When should you call for help?   Call 911 if you have symptoms of a heart attack. These may include:   Chest pain or pressure, or a strange feeling in the chest.    Sweating.    Shortness of breath.    Pain, pressure, or a strange feeling in the back, neck, jaw, or upper belly or in one or both shoulders or arms.    Lightheadedness or sudden weakness.    A fast or irregular heartbeat.   After you call 911, the operator may tell you to chew 1 adult-strength or 2 to 4 low-dose aspirin. Wait for an ambulance. Do not try to drive yourself.  Watch closely for changes in your health, and be sure to contact your doctor if you have any problems.  Where can you learn more?  Go to https://www.bennett.info/ and enter F075 to learn more about "A Healthy Heart: Care Instructions."  Current as of: September 7, 2022Content Version: 13.6   2006-2023 Healthwise, Incorporated.   Care instructions adapted under license by [facility]. If you have questions about a medical condition or this instruction, always ask your healthcare professional. [place] any warranty or liability for your use of this information.      Personalized Preventive Plan for MACIL CRADY - 10/15/2021  Medicare offers a range of preventive health benefits. Some of the tests and screenings are paid in full while other may be subject to a deductible, co-insurance, and/or  copay.    Some of these benefits include a comprehensive review of your medical history including lifestyle, illnesses that may run in your family, and various  assessments and screenings as appropriate.    After reviewing your medical record and screening and assessments performed today your provider may have ordered immunizations, labs, imaging, and/or referrals for you.  A list of these orders (if applicable) as well as your Preventive Care list are included within your After Visit Summary for your review.    Other Preventive Recommendations:    A preventive eye exam performed by an eye specialist is recommended every 1-2 years to screen for glaucoma; cataracts, macular degeneration, and other eye disorders.  A preventive dental visit is recommended every 6 months.  Try to get at least 150 minutes of exercise per week or 10,000 steps per day on a pedometer .  Order or download the FREE "Exercise & Physical Activity: Your Everyday Guide" from [employer]. Call 214-122-9652 or search [employer] online.  You need 1200-1500 mg of calcium and 1000-2000 IU of vitamin D per day. It is possible to meet your calcium requirement with diet alone, but a vitamin D supplement is usually necessary to meet this goal.  When exposed to the sun, use a sunscreen that protects against both UVA and UVB radiation with an SPF of 30 or greater. Reapply every 2 to 3 hours or after sweating, drying off with a towel, or swimming.  Always wear a seat belt when traveling in a car. Always wear a helmet when riding a bicycle or motorcycle.

## 2021-10-15 NOTE — Progress Notes (Signed)
Medicare Annual Wellness Visit    Audrey Snow is here for Medicare AWV    Assessment & Plan   Medicare annual wellness visit, subsequent      Recommendations for Preventive Services Due: see orders and patient instructions/AVS.  Recommended screening schedule for the next 5-10 years is provided to the patient in written form: see Patient Instructions/AVS.     No follow-ups on file.     Subjective   AWV today.    Patient's complete Health Risk Assessment and screening values have been reviewed and are found in Flowsheets. The following problems were reviewed today and where indicated follow up appointments were made and/or referrals ordered.    Positive Risk Factor Screenings with Interventions:       Cognitive:   Words recalled: 2 Words Recalled           Total Score Interpretation: Abnormal Mini-Cog      Interventions:  Patient declines any further evaluation or treatment              Dentist Screen:  Have you seen the dentist within the past year?: (!) No    Intervention:  Advised to schedule with their dentist    Hearing Screen:  Do you or your family notice any trouble with your hearing that hasn't been managed with hearing aids?: (!) Yes    Interventions:  Patient declines any further evaluation or treatment     Safety:  Do you have working smoke detectors?: (!) No  Interventions:  Instructed to put smoke detectors back up and maintain functional.              Objective      Patient-Reported Vitals  No data recorded            Allergies   Allergen Reactions    Yeast Nausea And Vomiting     Pt. Says she hasnt had issues with this in a long time    Yeast-Related Products Nausea And Vomiting and Headaches     Pt STATED NOT ALLERGIC     Prior to Visit Medications    Medication Sig Taking? Authorizing Provider   methotrexate Sodium (RHEUMATREX) 250 MG/10ML SOLN chemo injection Inject 20 mg into the skin once a week  Historical Provider, MD   losartan (COZAAR) 100 MG tablet Take 1 tablet by mouth daily TAKE 1  TABLET BY MOUTH DAILY  [name], MD   Cholecalciferol 100 MCG (4000 UT) TABS Take 1 tablet by mouth daily  Historical Provider, MD   Umeclidinium-Vilanterol (ANORO ELLIPTA) 62.5-25 MCG/ACT AEPB Inhale 1 puff into the lungs daily  Patient not taking: Reported on 09/06/2021  Georgiana Shore, APRN - CNP   folic acid (FOLVITE) 1 MG tablet Take 1 tablet by mouth daily  Jeoffrey Massed, APRN - CNP   atorvastatin (LIPITOR) 20 MG tablet Take 1 tablet by mouth daily  Jeoffrey Massed, APRN - CNP   levothyroxine (SYNTHROID) 100 MCG tablet TAKE 1 TABLET BY MOUTH DAILY BEFORE BREAKFAST  [name], MD   albuterol sulfate HFA 108 (90 Base) MCG/ACT inhaler Inhale 1 puff into the lungs every 6 hours as needed  Patient not taking: Reported on 09/06/2021  Ar Automatic Reconciliation   fluticasone (FLONASE) 50 MCG/ACT nasal spray 2 sprays by Nasal route daily PRN  Ar Automatic Reconciliation       CareTeam (Including outside providers/suppliers regularly involved in providing care):   Patient Care Team:  [name],  MD as PCP - General  [name], MD as PCP - Empaneled Provider  Greer Ee, MD as Physician  Zack Seal, RN as Nurse Navigator (Oncology)     Reviewed and updated this visit:  Tobacco  Allergies  Meds  Problems  Med Hx  Surg Hx  Soc Hx  Fam Hx           Audrey Snow, was evaluated through a synchronous (real-time) audio encounter. The patient (or guardian if applicable) is aware that this is a billable service, which includes applicable co-pays. This Virtual Visit was conducted with patient's (and/or legal guardian's) consent. The visit was conducted pursuant to the emergency declaration under the [place], [place] waiver authority and the [employer] Act.  Patient identification was verified, and a caregiver was present when appropriate.   The patient was located at  Home: [place] [id number]  Provider was located at [facility] (Appt Dept): [address]  [place] [address]        Selinda Orion, APRN - CNP

## 2021-11-05 MED ORDER — FOLIC ACID 1 MG PO TABS
1 MG | ORAL_TABLET | Freq: Every day | ORAL | 1 refills | Status: DC
Start: 2021-11-05 — End: 2021-12-25

## 2021-11-05 NOTE — Telephone Encounter (Signed)
Requested Prescriptions     Pending Prescriptions Disp Refills    folic acid (FOLVITE) 1 MG tablet 90 tablet 1     Sig: Take 1 tablet by mouth daily

## 2021-11-19 MED ORDER — METOPROLOL SUCCINATE ER 50 MG PO TB24
50 MG | ORAL_TABLET | Freq: Every day | ORAL | 1 refills | Status: AC
Start: 2021-11-19 — End: 2022-11-19

## 2021-11-20 MED ORDER — ATORVASTATIN CALCIUM 20 MG PO TABS
20 MG | ORAL_TABLET | Freq: Every day | ORAL | 1 refills | Status: AC
Start: 2021-11-20 — End: 2022-12-16

## 2021-12-25 ENCOUNTER — Ambulatory Visit: Admit: 2021-12-25 | Discharge: 2021-12-25 | Payer: MEDICARE | Attending: Internal Medicine | Primary: Internal Medicine

## 2021-12-25 ENCOUNTER — Encounter

## 2021-12-25 DIAGNOSIS — N1832 Chronic kidney disease, stage 3b: Secondary | ICD-10-CM

## 2021-12-25 MED ORDER — FOLIC ACID 1 MG PO TABS
1 MG | ORAL_TABLET | Freq: Every day | ORAL | 2 refills | Status: DC
Start: 2021-12-25 — End: 2021-12-25

## 2021-12-25 NOTE — Progress Notes (Signed)
HPI: Audrey Snow (DOB: [birth year])    Having worsening right shoulder pain    Has been doing well and since surgery and stays active    Very [facility] which limits her understanding and causing confusion at times  Problem List:  Patient Active Problem List   Diagnosis   . Rheumatoid arthritis involving multiple sites with positive rheumatoid factor ([place])   . Dry skin dermatitis   . Hyperlipidemia   . Chronic right shoulder pain   . Pre-ulcerative corn or callous   . Mass of lower lobe of right lung   . Vitamin D deficiency   . Environmental allergies   . HTN (hypertension)   . Bilateral leg cramps   . Gastroesophageal reflux disease without esophagitis   . Large cell carcinoma of lung, right ([place])   . Adenocarcinoma of right lung, stage 2 ([place])   . Bronchogenic cancer of right lung ([place])   . Chronic pain of left knee   . Elevated blood sugar   . Chronic obstructive pulmonary disease ([place])   . Hypothyroidism   . Chronic renal disease, stage III ([place]) [188416]   . Short-term memory loss   . Chronic otitis externa of right ear   . 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 Elevated C-reactive protein (CRP)   . Postmenopausal osteoporosis       History:  Past Medical History:   Diagnosis Date   . Arrhythmia 04/05/2020    PVCs.  04/05/20 was referred to [facility] for tachycardia, was seen 05/01/20 [facility]   . 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 Autoimmune disease ([place])     RA   . Bronchogenic cancer of right lung ([place]) 04/28/2020    adenocarcinoma   . Chronic obstructive pulmonary disease (HCC)     uses inhalers   . Chronic pain of left knee 06/25/2018   . Coronary atherosclerosis     noted on chest CT   . COVID-19 vaccine series completed 08/15/2019    Pfizer; 07/29/2019 and booster given 03/12/2020   . GERD (gastroesophageal reflux disease)     no meds at this time   . H/O cardiovascular stress test     [facility]  protocol, low risk study, EF of 67% and normal LV function   . H/O mammogram 07/28/2016    mammogram and Ultrasound of right breaset- benign finding- [facility]    . Hearing loss     "only hears out of left ear"   . Hearing loss of both ears     no hearing aids    . HTN (hypertension), benign 01/30/2014    controlled w/med   . Hypothyroid     on med for control   . Menopause    . Osteoarthritis    . Osteoporosis     prolia    . Poor historian     patient had trouble remembering names of meds and what they were for and had to repeat instructions for surgery several times    . 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 Debbra Riding. Methotrexate Rx   . Sinusitis     sinus flonase    . 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 Nancie Neas   . Unspecified hypothyroidism 01/30/2014    thyroid medication   . Vitamin D deficiency     medications for this  Allergies:  Allergies   Allergen Reactions   . Yeast Nausea And Vomiting     Pt. Says she hasnt had issues with this in a long time   . Yeast-Related Products Nausea And Vomiting and Headaches     Pt STATED NOT ALLERGIC       Current Medications:  Current Outpatient Medications   Medication Sig Dispense Refill   . atorvastatin (LIPITOR) 20 MG tablet Take 1 tablet by mouth daily 90 tablet 1   . metoprolol succinate (TOPROL XL) 50 MG extended release tablet TAKE 1 TABLET BY MOUTH DAILY 90 tablet 1   . losartan (COZAAR) 100 MG tablet Take 1 tablet by mouth daily TAKE 1 TABLET BY MOUTH DAILY 90 tablet 2   . Cholecalciferol 100 MCG (4000 UT) TABS Take 1 tablet by mouth daily     . Umeclidinium-Vilanterol (ANORO ELLIPTA) 62.5-25 MCG/ACT AEPB Inhale 1 puff into the lungs daily 1 each 11   . levothyroxine (SYNTHROID) 100 MCG tablet TAKE 1 TABLET BY MOUTH DAILY BEFORE BREAKFAST 30 tablet 11   . albuterol sulfate HFA 108 (90 Base) MCG/ACT inhaler Inhale 1 puff into the lungs every 6 hours as needed     . fluticasone (FLONASE) 50 MCG/ACT nasal spray 2 sprays by Nasal route  daily PRN       No current facility-administered medications for this visit.       Review of Systems:  Review of Systems   Constitutional:  Positive for unexpected weight change.        Wt gain   HENT:  Positive for hearing loss.    Respiratory:  Positive for shortness of breath.    Allergic/Immunologic: Positive for environmental allergies.   All other systems reviewed and are negative.    Vitals:  Ht 5\' 7"  (1.702 m)   Wt 155 lb 6.4 oz (70.5 kg)   BMI 24.34 kg/m     Physical Exam:  Physical Exam  Vitals reviewed.   Constitutional:       Appearance: Normal appearance. She is normal weight.   HENT:      Head: Normocephalic and atraumatic.      Left Ear: Tympanic membrane normal.      Ears:      Comments: Right TM with hole   Eyes:      Extraocular Movements: Extraocular movements intact.      Pupils: Pupils are equal, round, and reactive to light.   Cardiovascular:      Rate and Rhythm: Normal rate and regular rhythm.      Heart sounds: Normal heart sounds.   Pulmonary:      Effort: Pulmonary effort is normal.      Breath sounds: Normal breath sounds. No wheezing.   Abdominal:      Comments: Right sided abdominal wall hernia   Musculoskeletal:         General: Tenderness and deformity present.      Cervical back: Normal range of motion and neck supple.      Comments: Decrease ROM of right shoulder   Right biceps insertion tenderness   Skin:     General: Skin is warm and dry.   Neurological:      General: No focal deficit present.      Mental Status: She is alert and oriented to person, place, and time.   Psychiatric:         Mood and Affect: Mood normal.         Behavior: Behavior  normal.         Thought Content: Thought content normal.         Judgment: Judgment normal.        Assessment/Plan:   Audrey Snow was seen today for follow-up.    Diagnoses and all orders for this visit:    Stage 3b chronic kidney disease ([place])  Monitor on ARB  Rheumatoid arthritis with rheumatoid factor of multiple sites without organ or  systems involvement (HCC)  Getting injection  Elevated blood sugar    Mixed hyperlipidemia  On Lipitor  Primary hypertension  BP controlled  Acquired hypothyroidism  Need to update labs on current dose  Bronchogenic cancer of right lung (HCC)    Chronic right shoulder pain  Weaker on right so recommend exercises to help with pain and strengthening      Other orders  -     Discontinue: folic acid (FOLVITE) 1 MG tablet; Take 1 tablet by mouth daily          Current medications are therapeutic at this time; continue as prescribed.        [name], MD

## 2021-12-26 LAB — CBC WITH AUTO DIFFERENTIAL
Absolute Immature Granulocyte: 0 10*3/uL (ref 0.0–0.5)
Basophils %: 1 % (ref 0.0–2.0)
Basophils Absolute: 0 10*3/uL (ref 0.0–0.2)
Eosinophils %: 2 % (ref 0.5–7.8)
Eosinophils Absolute: 0.1 10*3/uL (ref 0.0–0.8)
Hematocrit: 43.3 % (ref 35.8–46.3)
Hemoglobin: 14.1 g/dL (ref 11.7–15.4)
Immature Granulocytes: 0 % (ref 0.0–5.0)
Lymphocytes %: 29 % (ref 13–44)
Lymphocytes Absolute: 2.3 10*3/uL (ref 0.5–4.6)
MCH: 31.9 PG (ref 26.1–32.9)
MCHC: 32.6 g/dL (ref 31.4–35.0)
MCV: 98 FL (ref 82–102)
MPV: 10 FL (ref 9.4–12.3)
Monocytes %: 7 % (ref 4.0–12.0)
Monocytes Absolute: 0.5 10*3/uL (ref 0.1–1.3)
Neutrophils %: 61 % (ref 43–78)
Neutrophils Absolute: 4.9 10*3/uL (ref 1.7–8.2)
Platelets: 222 10*3/uL (ref 150–450)
RBC: 4.42 M/uL (ref 4.05–5.2)
RDW: 13.2 % (ref 11.9–14.6)
WBC: 7.9 10*3/uL (ref 4.3–11.1)
nRBC: 0 10*3/uL (ref 0.0–0.2)

## 2021-12-26 LAB — COMPREHENSIVE METABOLIC PANEL
ALT: 18 U/L (ref 12–65)
AST: 20 U/L (ref 15–37)
Albumin/Globulin Ratio: 0.9 (ref 0.4–1.6)
Albumin: 3.8 g/dL (ref 3.2–4.6)
Alk Phosphatase: 44 U/L — ABNORMAL LOW (ref 50–136)
Anion Gap: 8 mmol/L (ref 2–11)
BUN: 19 MG/DL (ref 8–23)
CO2: 25 mmol/L (ref 21–32)
Calcium: 9.4 MG/DL (ref 8.3–10.4)
Chloride: 105 mmol/L (ref 101–110)
Creatinine: 1.6 MG/DL — ABNORMAL HIGH (ref 0.6–1.0)
Est, Glom Filt Rate: 32 mL/min/{1.73_m2} — ABNORMAL LOW (ref >60)
Globulin: 4.1 g/dL (ref 2.8–4.5)
Glucose: 106 mg/dL — ABNORMAL HIGH (ref 65–100)
Potassium: 3.5 mmol/L (ref 3.5–5.1)
Sodium: 138 mmol/L (ref 133–143)
Total Bilirubin: 1 MG/DL (ref 0.2–1.1)
Total Protein: 7.9 g/dL (ref 6.3–8.2)

## 2021-12-26 LAB — LIPID PANEL
Chol/HDL Ratio: 3.5
Cholesterol, Total: 241 MG/DL — ABNORMAL HIGH (ref <200)
HDL: 69 MG/DL — ABNORMAL HIGH (ref 40–60)
LDL Calculated: 151.2 MG/DL — ABNORMAL HIGH (ref <100)
Triglycerides: 104 MG/DL (ref 35–150)
VLDL Cholesterol Calculated: 20.8 MG/DL (ref 6.0–23.0)

## 2021-12-26 LAB — TSH: TSH, 3RD GENERATION: 102 u[IU]/mL — ABNORMAL HIGH (ref 0.358–3.740)

## 2022-01-01 ENCOUNTER — Encounter: Admit: 2022-01-01 | Discharge: 2022-01-01 | Payer: MEDICARE | Attending: Nurse Practitioner | Primary: Internal Medicine

## 2022-01-01 DIAGNOSIS — J449 Chronic obstructive pulmonary disease, unspecified: Secondary | ICD-10-CM

## 2022-01-01 LAB — SPIROMETRY WITHOUT BRONCHODILATOR
FEV1 %Pred-Pre: 51 %
FEV1/FVC: 53 %
FEV1: 1.11 L
FVC %Pred-Pre: 73 %
FVC: 2.11 L

## 2022-01-01 MED ORDER — ALBUTEROL SULFATE HFA 108 (90 BASE) MCG/ACT IN AERS
10890 (90 Base) MCG/ACT | RESPIRATORY_TRACT | 11 refills | Status: AC | PRN
Start: 2022-01-01 — End: 2023-07-15

## 2022-01-01 MED ORDER — ANORO ELLIPTA 62.5-25 MCG/ACT IN AEPB
Freq: Every day | RESPIRATORY_TRACT | 11 refills | Status: AC
Start: 2022-01-01 — End: 2022-09-09

## 2022-01-01 NOTE — Progress Notes (Signed)
Name:  Audrey Snow  Date of Birth:  Aug 01, 1940   MRN: [id number]      Office Visit: 01/01/2022        ASSESSMENT AND PLAN:  (Medical Decision Making)    Impression: 81 y.o. female     1. Chronic obstructive pulmonary disease, unspecified COPD type ([place])  --She has minimal dyspnea on exertion and overall feels well.  She was given Anoro at last visit, but she really has not used it.  I have given her samples of this for 4 weeks and have asked her to use it daily for 4 weeks.  If she does not feel significantly better, she can stop it and just continue to use albuterol as needed.  - Spirometry Without Bronchodilator    2.  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    3.  Personal history of tobacco use  --She  has a 10 pack year history of cigarette smoking, but quit in 1985.      Refills given on Anoro and albuterol.    Follow-up and Dispositions    Return in about 6 months (around 07/03/2022) for Jenny Reichmann or MD, COPD, Arrive 15 minutes prior to appt time.       Collaborating physician is Dr. Georgianne Fick.    ADS    Georgiana Shore, APRN - CNP      _________________________________________________________________________    HISTORY OF PRESENT ILLNESS:    Audrey Snow is a 81 y.o. female who is seen at [facility] today for  COPD     The patient is an extremely pleasant 81 year old female who is seen for follow up of COPD and history of adenocarcinoma--6 cm RLL mass.  She is accompanied by her granddaughter.  She has a history of vitamin D deficiency, hypothyroidism, RA (Dr. Alexandria Lodge [facility]), osteoporosis, HTN, extreme hearing loss, and GERD.  Her granddaughter assists with the history due to her severe hearing loss.  She has previously been followed by [facility] for COPD.  She  has a 21 pack year history of cigarette smoking, but quit in 1985.      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 Trina Ao.  She had a  near syncopal episode in July which led to a CT of  chest which demonstrated an incidental finding of a 5 cm RLL mass.  She has seen cardiology at [facility] and had Holter monitor and stress test (poor exercise capacity).  Denies any fever, chills, or night sweats.  Weight has been stable.  No  hemoptysis.        Had IR biopsy of right lung mass which revealed adenocarcinoma.  Was presented at tumor board--recommendation was for surgical resection and adjuvant chemotherapy.  Had right VATS and RLLobectomy, mediastinal lymphadenctomy, and cryoablation of intercostal  nerves on 05/07/20 by Dr. Sheria Lang.  Had port placed 07/04/20.  Sees Dr. Jenetta Loges.    She has not been using Anoro on a regular basis or her albuterol inhaler.  She has some dyspnea on exertion, but no cough or wheezing.  Generally she feels well.      REVIEW OF SYSTEMS: 10 point review of systems is negative except as reported in HPI.    PHYSICAL EXAM: Body mass index is 24.75 kg/m.  Vitals:    01/01/22 1005   BP: 134/76   Pulse: 58   Resp:  18   Temp: 97.3 F (36.3 C)   TempSrc: Temporal   SpO2: 94%   Weight: 158 lb (71.7 kg)   Height: _0  (1.702 m)         General:   Alert, cooperative, no distress, appears stated age.        Eyes:   Conjunctivae/corneas clear. PERRL        Mouth/Throat:  Lips, mucosa, and tongue normal. Teeth and gums normal.        Lungs:   Breath sounds are clear bilaterally.     Heart:   Regular rate and rhythm, S1, S2 normal, no murmur, click, rub or gallop.     Abdomen:    Soft, non-tender.     Extremities:  Extremities normal, atraumatic, no cyanosis or edema.     Skin:  Skin color normal. No rashes or lesions     Neurologic:  A&Ox3     DIAGNOSTIC TESTS:                                                                                    LABS:   Lab Results   Component Value Date/Time    WBC 7.9 12/25/2021 02:35 PM    HGB 14.1 12/25/2021 02:35 PM    HCT 43.3 12/25/2021 02:35 PM    PLT 222 12/25/2021 02:35 PM    TSH 20.500 06/07/2020 02:57  PM    ESR 9 06/25/2018 11:38 AM    CRP 3 06/25/2018 11:38 AM     Imaging: I performed an independent interpretation of the patient's images.  CXR:   XR ABDOMEN (KUB) (SINGLE AP VIEW) 12/12/2020    Narrative  ABDOMINAL FILMS, 1 view(s).    INDICATION: Right lower quadrant. Pneumobilia.    TECHNIQUE:  Supine views of the abdomen on 2 image(s).    COMPARISON: None.    FINDINGS: Bowel gas pattern is unremarkable. Bowel loops are not abnormally  dilated. There is gas in the stomach and rectum. Blunting right costophrenic  angle and surgical changes at the level of the diaphragm.    Impression  Unremarkable bowel gas pattern.    CT Chest:   CT CHEST ABDOMEN PELVIS W CONTRAST 08/29/2021    Narrative  CT CHEST ABDOMEN PELVIS W CONTRAST 08/29/2021 10:26 AM    HISTORY: Follow-up adenocarcinoma of the right lung. Palpable abnormality right  axilla and right abdomen.    COMPARISON: CT chest abdomen pelvis 02/08/2021.    TECHNIQUE: Multiple axial images were obtained through the chest, abdomen, and  pelvis.  Oral contrast was used for bowel opacification.  100 mL of Isovue 370  intravenous contrast was used for better evaluation of solid organs and vascular  structures.  Radiation dose reduction techniques were used for this study.  All  CT scans performed at this facility use one or all of the following: Automated  exposure control, adjustment of the mA and/or kVp according to patient's size,  iterative reconstruction.    FINDINGS:    LUNGS AND PLEURA: Centrilobular emphysema. Volume loss right hemithorax  consistent with partial right lung resection. No evidence of recurrent mass. No  pleural effusions.    MEDIASTINUM/AXILLAE: Heart  is enlarged. No pericardial effusion. Esophagus is  unremarkable. Thyroid gland not visualized. No enlarged lymph nodes. Scattered  coronary artery calcifications are present.    HEPATOBILIARY: Normal.    PANCREAS: Normal.    SPLEEN: Normal.    ADRENAL GLANDS: Normal.    KIDNEYS/BLADDER: Kidneys and  bladder are unremarkable. No hydronephrosis or  renal calculi.    BOWEL: No bowel obstruction. Appendix is normal.    LYMPH NODES: No enlarged lymph nodes.    VASCULATURE: Calcified and noncalcified plaque abdominal aorta without  significant aneurysmal dilatation or dissection.    PELVIC ORGANS: Uterus and rectum are unremarkable. No free pelvic fluid. No  pelvic mass.    MUSCULOSKELETAL: Degenerative spine changes. No destructive bone lesions are  appreciated. No abnormality noted in the area of patient's palpable lesions  right axilla on image 25 of series 2 and in the anterior right abdominal wall on  image 73.    Impression  1. No evidence of recurrent or metastatic disease in the chest, abdomen or  pelvis. Changes of partial right lung resection.  2. No CT findings in the area of patient's palpable abnormalities right axilla  and right anterior lateral abdominal wall.    Nuclear Medicine:   PET CT SKULL BASE TO MID THIGH 11/08/2020    Narrative  PET/CT    Indication: Restaging right lung cancer    Radiopharmaceutical: 10.78 mCi F18-FDG, intravenously.    Technique: Imaging was performed from the skull through the proximal thighs  using routine PET/CT acquisition protocol. Imaging was performed approximately  60 minutes post injection. Oral contrast was administered. Radiation dose  reduction techniques were used for this study:  Our CT scanners use one or all  of the following: Automated exposure control, adjustment of the mA and/or kVp  according to patient's size, iterative reconstruction.      Serum glucose: 104 mg/dL prior to injection.    Comparison studies: CT chest 10/22/2020, PET/CT 04/02/2020    Findings:    Head and Neck: Stable hypermetabolic nodule in the left parotid gland.    Chest: Multivessel coronary atherosclerotic calcifications. Aortic  atherosclerotic calcifications without aneurysm. The lungs are emphysematous. No  recurrent mass or suspicious elevated FDG uptake.    No enlarged or  hypermetabolic hilar or mediastinal lymph nodes.    Abdomen/Pelvis: No lymphadenopathy. No worrisome focal uptake in the abdominal  viscera. Normal uterus. No adnexal mass. Aortic atherosclerotic calcifications  without aneurysm.    Bones: No aggressive bone lesion or worrisome focal FDG uptake.    Impression  No recurrent mass in the right lung. No evidence of distant metastatic disease.      PFTs:   Office Spirometry Results Latest Ref Rng & Units 01/01/2022 07/09/2021   FVC L 2.11 2.40   FEV1 L 1.11 1.27   FEV1 %PRED-PRE % 51 58   FVC %PRED-PRE % 73 82   FEV1/FVC % 53 53     No results found for this or any previous visit. No results found for this or any previous visit.    FeNO: No results found for this or any previous visit.  FeNO and Likelihood of Eosinophilic Asthma   Unlikely Intermediate Likely   <25 ppb 25-50 ppb >50ppb     Exercise Oximetry:    Echo: No results found for this or any previous visit from the past 3650 days.      PMH Reference Info:  Immunization History   Administered Date(s) Administered    COVID-19, PFIZER PURPLE top, DILUTE for use, (age 27 y+), 72mg/0.3mL 07/29/2019, 08/15/2019, 03/12/2020    Influenza Virus Vaccine 04/13/2016, 04/07/2019    Influenza, FLUAD, (age 1345y+), Adjuvanted, 0.512m09/17/2020, 04/25/2021    Influenza, High Dose (Fluzone 65 yrs and older) 04/22/2011, 03/17/2013, 07/04/2014, 04/06/2015, 05/21/2016, 05/04/2017, 03/21/2020    Influenza, Triv, inactivated, subunit, adjuvanted, IM (Fluad 65 yrs and older) 02/08/2018    Influenza, Trivalent PF 06/22/2003, 06/10/2005, 06/03/2006, 04/19/2008, 05/07/2009, 05/24/2010    PPD Test 06/13/2003, 10/31/2004, 12/16/2005, 02/01/2007, 04/26/2008, 05/23/2009    Pneumococcal, PCV-13, PREVNAR 134(age 13w+), IM, 0.5m68m1/06/2016    Pneumococcal, PPSV23, PNEUMOVAX 23, (age 2y+), SC/IM, 0.5mL77m/23/2011    Zoster Recombinant  (Shingrix) 02/08/2018, 05/29/2018     Past Medical History:   Diagnosis Date    Arrhythmia 04/05/2020    PVCs.  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, frequent ventricular ectopy, runs nonsustained VT    Autoimmune disease (HCC)[place]  RA    Bronchogenic cancer of right lung (HCC)[place]/23/2021    adenocarcinoma    Chronic obstructive pulmonary disease (HCC)[place]  uses inhalers    Chronic pain of left knee 06/25/2018    Coronary atherosclerosis     noted on chest CT    COVID-19 vaccine series completed 08/15/2019    Pfizer; 07/29/2019 and booster given 03/12/2020    GERD (gastroesophageal reflux disease)     no meds at this time    H/O cardiovascular stress test     Bruce protocol, low risk study, EF of 67% and normal LV function    H/O mammogram 07/28/2016    mammogram and Ultrasound of right breaset- benign finding- [facility]     Hearing loss     "only hears out of left ear"    Hearing loss of both ears     no hearing aids     HTN (hypertension), benign 01/30/2014    controlled w/med    Hypothyroid     on med for control    Menopause     Osteoarthritis     Osteoporosis     prolia     Poor historian     patient had trouble remembering names of meds and what they were for and had to repeat instructions for surgery several times     Rheumatoid arthritis(714.0) 01/30/2014    followed by Dr. LipsDebbra Ridingthotrexate Rx    Sinusitis     sinus flonase     Thymoma, malignant (HCC)[place]    B type Dr. StepNancie NeasUnspecified hypothyroidism 01/30/2014    thyroid medication    Vitamin D deficiency     medications for this         Tobacco Use      Smoking status: Former        Packs/day: 1.00        Years: 10.00        Pack years: 10        Types: Cigarettes        Start date: 07/08/1959        Quit date: 07/08/1983        Years since quitting: 38.5      Smokeless tobacco: Never      Tobacco comments: Quit smoking: not regular when first  started- 1pk/week    Allergies   Allergen Reactions    Yeast Nausea And Vomiting     Pt. Says she hasnt had issues with this in a long time    Yeast-Related Products Nausea And Vomiting and Headaches     Pt STATED NOT ALLERGIC     Current Outpatient Medications   Medication Instructions    albuterol sulfate HFA (PROVENTIL;VENTOLIN;PROAIR) 108 (90 Base) MCG/ACT inhaler 1 puff, Inhalation, EVERY 4 HOURS PRN    atorvastatin (LIPITOR) 20 mg, Oral, DAILY    Cholecalciferol 100 MCG (4000 UT) TABS 1 tablet, Oral, DAILY    fluticasone (FLONASE) 50 MCG/ACT nasal spray 2 sprays, Nasal, DAILY, PRN    levothyroxine (SYNTHROID) 100 MCG tablet TAKE 1 TABLET BY MOUTH DAILY BEFORE BREAKFAST    losartan (COZAAR) 100 mg, Oral, DAILY, TAKE 1 TABLET BY MOUTH DAILY    metoprolol succinate (TOPROL XL) 50 mg, Oral, DAILY    umeclidinium-vilanterol (ANORO ELLIPTA) 62.5-25 MCG/ACT inhaler 1 puff, Inhalation, DAILY

## 2022-01-20 ENCOUNTER — Encounter: Payer: MEDICARE | Attending: Nurse Practitioner | Primary: Internal Medicine

## 2022-02-11 MED ORDER — LEVOTHYROXINE SODIUM 100 MCG PO TABS
100 MCG | ORAL_TABLET | ORAL | 11 refills | Status: AC
Start: 2022-02-11 — End: 2022-06-25

## 2022-03-07 ENCOUNTER — Encounter

## 2022-03-11 ENCOUNTER — Ambulatory Visit: Admit: 2022-03-11 | Payer: MEDICARE | Attending: Internal Medicine | Primary: Internal Medicine

## 2022-03-11 ENCOUNTER — Encounter: Payer: MEDICARE | Primary: Internal Medicine

## 2022-03-11 DIAGNOSIS — C3491 Malignant neoplasm of unspecified part of right bronchus or lung: Secondary | ICD-10-CM

## 2022-03-11 LAB — COMPREHENSIVE METABOLIC PANEL
ALT: 19 U/L (ref 12–65)
AST: 18 U/L (ref 15–37)
Albumin/Globulin Ratio: 1 (ref 0.4–1.6)
Albumin: 3.8 g/dL (ref 3.2–4.6)
Alk Phosphatase: 47 U/L — ABNORMAL LOW (ref 50–136)
Anion Gap: 8 mmol/L (ref 2–11)
BUN: 27 MG/DL — ABNORMAL HIGH (ref 8–23)
CO2: 27 mmol/L (ref 21–32)
Calcium: 9.4 MG/DL (ref 8.3–10.4)
Chloride: 103 mmol/L (ref 101–110)
Creatinine: 1.6 MG/DL — ABNORMAL HIGH (ref 0.6–1.0)
Est, Glom Filt Rate: 32 mL/min/{1.73_m2} — ABNORMAL LOW (ref >60)
Globulin: 3.9 g/dL (ref 2.8–4.5)
Glucose: 116 mg/dL — ABNORMAL HIGH (ref 65–100)
Potassium: 3.6 mmol/L (ref 3.5–5.1)
Sodium: 138 mmol/L (ref 133–143)
Total Bilirubin: 1 MG/DL (ref 0.2–1.1)
Total Protein: 7.7 g/dL (ref 6.3–8.2)

## 2022-03-11 LAB — CBC WITH AUTO DIFFERENTIAL
Absolute Immature Granulocyte: 0 10*3/uL (ref 0.0–0.5)
Basophils %: 1 % (ref 0.0–2.0)
Basophils Absolute: 0.1 10*3/uL (ref 0.0–0.2)
Eosinophils %: 2 % (ref 0.5–7.8)
Eosinophils Absolute: 0.2 10*3/uL (ref 0.0–0.8)
Hematocrit: 40 % (ref 35.8–46.3)
Hemoglobin: 13.2 g/dL (ref 11.7–15.4)
Immature Granulocytes: 0 % (ref 0.0–5.0)
Lymphocytes %: 34 % (ref 13–44)
Lymphocytes Absolute: 2.8 10*3/uL (ref 0.5–4.6)
MCH: 32.1 PG (ref 26.1–32.9)
MCHC: 33 g/dL (ref 31.4–35.0)
MCV: 97.3 FL (ref 82.0–102.0)
MPV: 9.6 FL (ref 9.4–12.3)
Monocytes %: 9 % (ref 4.0–12.0)
Monocytes Absolute: 0.7 10*3/uL (ref 0.1–1.3)
Neutrophils %: 54 % (ref 43–78)
Neutrophils Absolute: 4.4 10*3/uL (ref 1.7–8.2)
Platelets: 201 10*3/uL (ref 150–450)
RBC: 4.11 M/uL (ref 4.05–5.2)
RDW: 12.5 % (ref 11.9–14.6)
WBC: 8.1 10*3/uL (ref 4.3–11.1)
nRBC: 0 10*3/uL (ref 0.0–0.2)

## 2022-03-11 NOTE — Progress Notes (Signed)
[facility]: Office Visit Established Patient    Chief Complaint:    Chief Complaint   Patient presents with    Follow-up         History of Present Illness:  Ms. Beal is a 81 y.o. female who returns today for management of adenocarcinoma of the  lung.  On 02/27/20, Ms. Nader presented to [facility] ED with complaints of achiness in chest with radiation of pain to her left upper extremity with association of N/V, SOB and diaphoresis. Cardiac work-up was negative; however her chest x-ray reported a 5.1 cm rounded opacity at the right lung base.  She saw her PCP on 02/29/20, CT scan was ordered and reported a 5 cm mass with adjacent pleural nodularity posteriorly at the right lung base and considered suspicious for bronchogenic carcinoma. On 04/02/20 she  had a PET/CT scan that reported a 6 cm right lower lobe pulmonary mass with no metastatic disease.  Biopsy showed adenocarcinoma of lung origin.  We saw her prior to surgical evaluation and recommended proceeding with lobectomy and lymphadenectomy if  she was a surgical candidate.  She completed VATS with left lower lobectomy as well as chest wall mass resection and lymphadenectomy on 05/07/20.  Her tumor was 6.4 cm in greatest dimension with carcinoma in the specimen marked "chest wall" but with separation from tumor by connective tissue and lung parenchyma.  13 lymph node fragments were all negative for tumor.  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  Because of the T3N0 disease, we would consider adjuvant  chemotherapy, likely cisplatin and pemetrexed for 4 cycles.  We do also have the ALCHEMIST trial available, but unfortunately she is not a candidate due to her history of RA with MTX use.  She completed 4 cycles of cisplatin and Alimta with adequate tolerance.  CT chest at the conclusion of therapy reviewed and shows nothing of suspicion in the lung.  However, she does have a 1.4 cm adrenal nodule  which was not obviously present on pretherapy imaging.  We recommended PET/CT to assess but this shows no significant uptake in the left adrenal or other areas of concern.  We will continue observation with repeat CT in 3 months.     Here for follow-up.  No major changes, she is doing well overall.  Energy level is mostly good, ECOG 1.  She continues to have some swelling associated with her chest wall incision.  She notes shortness of breath with extreme exertion.  No other new symptoms.       Review of Systems:  Constitutional: Positive for fatigue.   HENT: Negative.   Eyes: Negative.   Respiratory: Negative.   Cardiovascular: Negative.   Gastrointestinal: Negative.   Genitourinary: Negative.   Musculoskeletal: Positive for swelling.   Skin: Negative.   Neurological: Negative.   Endo/Heme/Allergies: Negative.   Psychiatric/Behavioral: Negative.   All other systems reviewed and are negative.       Allergies   Allergen Reactions    Yeast Nausea And Vomiting     Pt. Says she hasnt had issues with this in a long time    Yeast-Related Products Nausea And Vomiting and Headaches     Pt STATED NOT ALLERGIC     Past Medical History:   Diagnosis Date    Arrhythmia 04/05/2020    PVCs.  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, frequent ventricular ectopy, runs nonsustained VT    Autoimmune disease ([place])     RA    Bronchogenic cancer of right lung ([place]) 04/28/2020    adenocarcinoma    Chronic obstructive pulmonary disease ([place])     uses inhalers    Chronic pain of left knee 06/25/2018    Coronary atherosclerosis     noted on chest CT    COVID-19 vaccine series completed 08/15/2019    Pfizer; 07/29/2019 and booster given 03/12/2020    GERD (gastroesophageal reflux disease)     no meds at this time    H/O cardiovascular stress test     Bruce protocol, low risk study, EF of 67% and normal LV function    H/O mammogram 07/28/2016     mammogram and Ultrasound of right breaset- benign finding- [facility]     Hearing loss     "only hears out of left ear"    Hearing loss of both ears     no hearing aids     HTN (hypertension), benign 01/30/2014    controlled w/med    Hypothyroid     on med for control    Menopause     Osteoarthritis     Osteoporosis     prolia     Poor historian     patient had trouble remembering names of meds and what they were for and had to repeat instructions for surgery several times     Rheumatoid arthritis(714.0) 01/30/2014    followed by Dr. Debbra Riding. 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 Kristopher Glee in past    COLONOSCOPY N/A 01/27/2018    COLONOSCOPY/BMI 26 performed by Azalia Bilis, MD at [place] FLX DX W/COLLJ SPEC WHEN PFRMD  01/27/2018         CT NEEDLE BIOPSY LUNG PERCUTANEOUS  04/16/2020    CT NEEDLE BIOPSY LUNG PERCUTANEOUS 04/16/2020 [facility] CT SCAN    HEMORRHOID SURGERY      OTHER SURGICAL HISTORY  09/2012    sinus surgery-Dr. Mickle Mallory    OTHER SURGICAL HISTORY  01/27/2013    Thymoma-Dr. Nancie Neas    TONSILLECTOMY  81 yrs old    UPPER GASTROINTESTINAL ENDOSCOPY  2015    Gastric AVM txed with cautery-Dr. Orpah Melter     Family History   Problem Relation Age of Onset    Osteoarthritis Mother     Breast Cancer Neg Hx     Alzheimer's Disease Father      Social History     Socioeconomic History    Marital status: Widowed     Spouse name: Not on file    Number of children: Not on file    Years of education: Not on file    Highest education level: Not on file   Occupational History    Not on file   Tobacco Use    Smoking status: Former     Packs/day: 1.00  Years: 10.00     Pack years:  10.00     Types: Cigarettes     Start date: 07/08/1959     Quit date: 07/08/1983     Years since quitting: 38.7    Smokeless tobacco: Never    Tobacco comments:     Quit smoking: not regular when first started- 1pk/week   Substance and Sexual Activity    Alcohol use: No     Alcohol/week: 0.0 standard drinks    Drug use: No    Sexual activity: Not Currently     Partners: Male   Other Topics Concern    Not on file   Social History Narrative    Widowed since 06/29/21. 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     Social Determinants of Health     Financial Resource Strain: Low Risk     Difficulty of Paying Living Expenses: Not hard at all   Food Insecurity: No Food Insecurity    Worried About Charity fundraiser in the Last Year: Never true    [place] in the Last Year: Never true   Transportation Needs: Unknown    Lack of Transportation (Medical): Not on file    Lack of Transportation (Non-Medical): No   Physical Activity: Not on file   Stress: Not on file   Social Connections: Not on file   Intimate Partner Violence: Not on file   Housing Stability: Unknown    Unable to Pay for Housing in the Last Year: Not on file    Number of Places Lived in the Last Year: Not on file    Unstable Housing in the Last Year: No     Current Outpatient Medications   Medication Sig Dispense Refill    levothyroxine (SYNTHROID) 100 MCG tablet TAKE 1 TABLET BY MOUTH DAILY BEFORE BREAKFAST 30 tablet 11    albuterol sulfate HFA (PROVENTIL;VENTOLIN;PROAIR) 108 (90 Base) MCG/ACT inhaler Inhale 1 puff into the lungs every 4 hours as needed for Shortness of Breath or Wheezing 18 g 11    atorvastatin (LIPITOR) 20 MG tablet Take 1 tablet by mouth daily 90 tablet 1    Cholecalciferol 100 MCG (4000 UT) TABS Take 1 tablet by mouth daily      umeclidinium-vilanterol (ANORO ELLIPTA) 62.5-25 MCG/ACT inhaler Inhale 1 puff into the lungs daily (Patient not taking: Reported on 03/11/2022) 1 each 11    metoprolol succinate (TOPROL XL) 50 MG extended release tablet  TAKE 1 TABLET BY MOUTH DAILY (Patient not taking: Reported on 03/11/2022) 90 tablet 1    losartan (COZAAR) 100 MG tablet Take 1 tablet by mouth daily TAKE 1 TABLET BY MOUTH DAILY (Patient not taking: Reported on 03/11/2022) 90 tablet 2    fluticasone (FLONASE) 50 MCG/ACT nasal spray 2 sprays by Nasal route daily PRN       No current facility-administered medications for this visit.       OBJECTIVE:  BP (!) 142/68   Pulse 54   Temp 97.6 F (36.4 C) (Oral)   Resp 12   Ht 5\' 7"  (1.702 m)   Wt 156 lb 12.8 oz (71.1 kg)   SpO2 96%   BMI 24.56 kg/m     Physical Exam:  Constitutional: Well developed, well nourished female in no acute distress, sitting comfortably in the exam room chair.    HEENT: Normocephalic and atraumatic. Sclerae anicteric. Neck supple without JVD. No thyromegaly present.    Lymph node   No palpable submandibular,  cervical, supraclavicular lymph nodes.   Skin Warm and dry.  No bruising and no rash noted.  No erythema.  No pallor.    Respiratory Lungs are clear to auscultation bilaterally without wheezes, rales or rhonchi, normal air exchange without accessory muscle use.    CVS Normal rate, regular rhythm and normal S1 and S2.  No murmurs, gallops, or rubs.   Abdomen Soft, nontender and nondistended, normoactive bowel sounds.  No palpable mass.  No hepatosplenomegaly.   Neuro Grossly nonfocal with no obvious sensory or motor deficits.   MSK Normal range of motion in general.  No edema and no tenderness.   Psych Appropriate mood and affect.      Labs:  Recent Results (from the past 96 hour(s))   CBC with Auto Differential    Collection Time: 03/11/22  9:47 AM   Result Value Ref Range    WBC 8.1 4.3 - 11.1 K/uL    RBC 4.11 4.05 - 5.2 M/uL    Hemoglobin 13.2 11.7 - 15.4 g/dL    Hematocrit 40.0 35.8 - 46.3 %    MCV 97.3 82.0 - 102.0 FL    MCH 32.1 26.1 - 32.9 PG    MCHC 33.0 31.4 - 35.0 g/dL    RDW 12.5 11.9 - 14.6 %    Platelets 201 150 - 450 K/uL    MPV 9.6 9.4 - 12.3 FL    nRBC 0.00 0.0 - 0.2 K/uL     Differential Type AUTOMATED      Neutrophils % 54 43 - 78 %    Lymphocytes % 34 13 - 44 %    Monocytes % 9 4.0 - 12.0 %    Eosinophils % 2 0.5 - 7.8 %    Basophils % 1 0.0 - 2.0 %    Immature Granulocytes 0 0.0 - 5.0 %    Neutrophils Absolute 4.4 1.7 - 8.2 K/UL    Lymphocytes Absolute 2.8 0.5 - 4.6 K/UL    Monocytes Absolute 0.7 0.1 - 1.3 K/UL    Eosinophils Absolute 0.2 0.0 - 0.8 K/UL    Basophils Absolute 0.1 0.0 - 0.2 K/UL    Absolute Immature Granulocyte 0.0 0.0 - 0.5 K/UL       Imaging:  CT CHEST ABDOMEN PELVIS W CONTRAST Additional Contrast? None    Result Date: 08/29/2021  CT CHEST ABDOMEN PELVIS W CONTRAST 08/29/2021 10:26 AM HISTORY: Follow-up adenocarcinoma of the right lung. Palpable abnormality right axilla and right abdomen. COMPARISON: CT chest abdomen pelvis 02/08/2021. TECHNIQUE: Multiple axial images were obtained through the chest, abdomen, and pelvis.  Oral contrast was used for bowel opacification.  100 mL of Isovue 370 intravenous contrast was used for better evaluation of solid organs and vascular structures.  Radiation dose reduction techniques were used for this study.  All CT scans performed at this facility use one or all of the following: Automated exposure control, adjustment of the mA and/or kVp according to patient's size, iterative reconstruction. FINDINGS: LUNGS AND PLEURA: Centrilobular emphysema. Volume loss right hemithorax consistent with partial right lung resection. No evidence of recurrent mass. No pleural effusions. MEDIASTINUM/AXILLAE: Heart is enlarged. No pericardial effusion. Esophagus is unremarkable. Thyroid gland not visualized. No enlarged lymph nodes. Scattered coronary artery calcifications are present. HEPATOBILIARY: Normal. PANCREAS: Normal. SPLEEN: Normal. ADRENAL GLANDS: Normal. KIDNEYS/BLADDER: Kidneys and bladder are unremarkable. No hydronephrosis or renal calculi. BOWEL: No bowel obstruction. Appendix is normal. LYMPH NODES: No enlarged lymph nodes.  VASCULATURE: Calcified and noncalcified plaque abdominal aorta  without significant aneurysmal dilatation or dissection. PELVIC ORGANS: Uterus and rectum are unremarkable. No free pelvic fluid. No pelvic mass. MUSCULOSKELETAL: Degenerative spine changes. No destructive bone lesions are appreciated. No abnormality noted in the area of patient's palpable lesions right axilla on image 25 of series 2 and in the anterior right abdominal wall on image 73.     1. No evidence of recurrent or metastatic disease in the chest, abdomen or pelvis. Changes of partial right lung resection. 2. No CT findings in the area of patient's palpable abnormalities right axilla and right anterior lateral abdominal wall.           CT CHEST ABDOMEN PELVIS W CONTRAST    Result Date: 02/08/2021  EXAM: CT CHEST, ABDOMEN, AND PELVIS WITH CONTRAST INDICATION: Lung cancer. COMPARISON: PET/CT 11/08/2020, chest CT 10/22/2020 TECHNIQUE:  CT imaging was performed of the chest, abdomen, and pelvis after intravenous injection of 100 mL Isovue 370. Intravenous contrast was used for better evaluation of solid organs and vascular structures. Oral contrast was used for bowel opacification. Coronal reformatted imaging provided. Radiation dose reduction techniques were used for this study. Our CT scanners use one or all of the following: Automated exposure control, adjustment of the mA and/or kV according to patient size, iterative reconstruction. FINDINGS: CHEST: Mediastinum and visualized thyroid: No lymphadenopathy. Heart: Multivessel coronary atherosclerotic calcifications. Large Vessels: Normal. Pleura: Normal. Lungs: Post surgical changes in the right lung. No recurrent tumor evident. Normal right lung hilum. Airways: Normal. ABDOMEN AND PELVIS: Motion limited evaluation of the abdomen Liver: Normal in size and morphology.  No focal lesions. Gallbladder/biliary: Normal gallbladder.  No biliary dilatation. Pancreas: Normal. Spleen: Normal. Adrenals: Stable  appearance of the left adrenal gland.. Kidneys: No focal lesion or hydronephrosis. Bladder: Normal. Pelvic organs: Normal uterus.  No adnexal mass. Gastrointestinal: Colonic diverticulosis.. Peritoneum/retroperitoneum: Normal. Lymph nodes: Normal. Vessels: Aortic atherosclerotic disease. Bones/Soft tissues: No aggressive appearing bone lesion.     Post surgical changes in the right lung without evidence of locally recurrent tumor or metastatic disease in the chest, abdomen, or pelvis.        ASSESSMENT:   Diagnosis Orders   1. Adenocarcinoma of right lung, stage 2 (HCC)  CT CHEST ABDOMEN PELVIS W CONTRAST Additional Contrast? None      2. Large cell carcinoma of lung, right (HCC)  CT CHEST ABDOMEN PELVIS W CONTRAST Additional Contrast? None      3. Malignant neoplasm of unspecified part of right bronchus or lung ([place])  CT CHEST ABDOMEN PELVIS W CONTRAST Additional Contrast? None                  PLAN:  Lab studies were personally reviewed.    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  PET/CT shows no distant disease.  Clinically T3N0M0.  She completed VATS with left lower lobectomy as well as chest wall mass resection  and lymphadenectomy on 05/07/20.  She tolerated this reasonably well, still with some post-op tenderness but nothing unmanageable.  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  13 lymph node fragments were all negative for tumor.  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  Because of the T3N0 disease, we would consider adjuvant chemotherapy,  likely cisplatin and pemetrexed for 4  cycles.  We do also have the ALCHEMIST trial available, but unfortunately she is not a candidate due to her history of RA with MTX use.  She completed 4 cycles of cisplatin and  Alimta with adequate tolerance.  CT chest at the conclusion of therapy reviewed and shows nothing of suspicion in the lung.  However, she does have a 1.4 cm adrenal nodule which was not obviously present on pretherapy imaging.  We recommended PET/CT to assess but this shows no significant uptake in the left adrenal or other areas of concern.  We will continue observation with repeat CT in 3 months.     Here for follow-up.  No major changes, she is doing well overall.  Energy level is mostly good, ECOG 1.  She continues to have some swelling associated with her chest wall incision.  She notes shortness of breath with extreme exertion.  No other new symptoms.   Labs reviewed and unremarkable.  She did not have her updated CT prior to today's visit, we will schedule this and continue with semiannual follow-up until 2 years post-treatment, then switch to annually barring any changes.  She is comfortable with the plan.  All questions were asked and answered to the best of my ability.  F/u in 6 months with CT or sooner if needed for any findings on her updated imaging.             Greer Ee, MD  [facility] [place] [place] [address]  Office : [phone]  Fax : [phone]

## 2022-03-11 NOTE — Patient Instructions (Addendum)
Patient Instructions from Today's Visit    Reason for Visit:  Follow up Lung Cancer     Diagnosis Information:  https://www.cancer.net/about-us/asco-answers-patient-education-materials/asco-answers-fact-sheets    Plan:  Lab results reviewed     Follow Up:  Follow up 6 mo     Recent Lab Results:  Hospital Outpatient Visit on 03/11/2022   Component Date Value Ref Range Status    WBC 03/11/2022 8.1  4.3 - 11.1 K/uL Final    RBC 03/11/2022 4.11  4.05 - 5.2 M/uL Final    Hemoglobin 03/11/2022 13.2  11.7 - 15.4 g/dL Final    Hematocrit 03/11/2022 40.0  35.8 - 46.3 % Final    MCV 03/11/2022 97.3  82.0 - 102.0 FL Final    MCH 03/11/2022 32.1  26.1 - 32.9 PG Final    MCHC 03/11/2022 33.0  31.4 - 35.0 g/dL Final    RDW 03/11/2022 12.5  11.9 - 14.6 % Final    Platelets 03/11/2022 201  150 - 450 K/uL Final    MPV 03/11/2022 9.6  9.4 - 12.3 FL Final    nRBC 03/11/2022 0.00  0.0 - 0.2 K/uL Final    **Note: Absolute NRBC parameter is now reported with Hemogram**    Differential Type 03/11/2022 AUTOMATED    Final    Neutrophils % 03/11/2022 54  43 - 78 % Final    Lymphocytes % 03/11/2022 34  13 - 44 % Final    Monocytes % 03/11/2022 9  4.0 - 12.0 % Final    Eosinophils % 03/11/2022 2  0.5 - 7.8 % Final    Basophils % 03/11/2022 1  0.0 - 2.0 % Final    Immature Granulocytes 03/11/2022 0  0.0 - 5.0 % Final    Neutrophils Absolute 03/11/2022 4.4  1.7 - 8.2 K/UL Final    Lymphocytes Absolute 03/11/2022 2.8  0.5 - 4.6 K/UL Final    Monocytes Absolute 03/11/2022 0.7  0.1 - 1.3 K/UL Final    Eosinophils Absolute 03/11/2022 0.2  0.0 - 0.8 K/UL Final    Basophils Absolute 03/11/2022 0.1  0.0 - 0.2 K/UL Final    Absolute Immature Granulocyte 03/11/2022 0.0  0.0 - 0.5 K/UL Final     Treatment Summary has been discussed and given to patient:   N/A  -------------------------------------------------------------------------------------------------------------------  Please call our office at [phone] if you have any  of the following  symptoms:   Fever of 100.5 or greater  Chills  Shortness of breath  Swelling or pain in one leg    After office hours an answering service is available and will contact a provider for emergencies or if you are experiencing any of the above symptoms.    Patient does express an interest in My Chart.  My Chart log in information explained on the after visit summary printout at the [place] desk.    Edmonia James, RN

## 2022-03-12 ENCOUNTER — Ambulatory Visit: Payer: MEDICARE | Primary: Internal Medicine

## 2022-03-14 ENCOUNTER — Inpatient Hospital Stay: Admit: 2022-03-14 | Payer: MEDICARE | Attending: Internal Medicine | Primary: Internal Medicine

## 2022-03-14 DIAGNOSIS — C3491 Malignant neoplasm of unspecified part of right bronchus or lung: Secondary | ICD-10-CM

## 2022-03-14 MED ORDER — NORMAL SALINE FLUSH 0.9 % IV SOLN
0.9 % | Freq: Two times a day (BID) | INTRAVENOUS | Status: AC
Start: 2022-03-14 — End: 2022-03-17
  Administered 2022-03-14: 16:00:00 10 mL via INTRAVENOUS

## 2022-03-14 MED ORDER — DIATRIZOATE MEGLUMINE & SODIUM 66-10 % PO SOLN
66-10 % | Freq: Once | ORAL | Status: AC | PRN
Start: 2022-03-14 — End: 2022-03-17
  Administered 2022-03-14: 16:00:00 15 mL via ORAL

## 2022-03-14 MED ORDER — SODIUM CHLORIDE 0.9 % IV BOLUS
0.9 % | Freq: Once | INTRAVENOUS | Status: AC
Start: 2022-03-14 — End: 2022-03-14
  Administered 2022-03-14: 16:00:00 100 mL via INTRAVENOUS

## 2022-03-14 MED ORDER — IOPAMIDOL 76 % IV SOLN
76 % | Freq: Once | INTRAVENOUS | Status: AC | PRN
Start: 2022-03-14 — End: 2022-03-14
  Administered 2022-03-14: 16:00:00 100 mL via INTRAVENOUS

## 2022-03-14 MED FILL — ISOVUE-370 76 % IV SOLN: 76 % | INTRAVENOUS | Qty: 100

## 2022-03-14 MED FILL — GASTROGRAFIN 66-10 % PO SOLN: 66-10 % | ORAL | Qty: 30

## 2022-03-27 NOTE — Progress Notes (Signed)
Formatting of this note might be different from the original.  Pt is here for Prolia injection. Pt states no hospitalizations, surgeries, allergies, rashes, infections, antibiotics, or other changes since the last office visit. Vitals are normal. Labs reviewed. Pt tolerated injection well.   Electronically signed by Theodoro Clock, RN at 03/27/2022 10:45 AM EDT

## 2022-06-25 ENCOUNTER — Encounter

## 2022-06-25 ENCOUNTER — Encounter: Admit: 2022-06-25 | Discharge: 2022-06-25 | Payer: MEDICARE | Attending: Internal Medicine | Primary: Internal Medicine

## 2022-06-25 DIAGNOSIS — E039 Hypothyroidism, unspecified: Secondary | ICD-10-CM

## 2022-06-25 MED ORDER — LOSARTAN POTASSIUM 100 MG PO TABS
100 MG | ORAL_TABLET | Freq: Every day | ORAL | 2 refills | Status: AC
Start: 2022-06-25 — End: ?

## 2022-06-25 MED ORDER — LEVOTHYROXINE SODIUM 100 MCG PO TABS
100 MCG | ORAL_TABLET | Freq: Every day | ORAL | 2 refills | Status: AC
Start: 2022-06-25 — End: 2022-06-27

## 2022-06-25 MED ORDER — ATORVASTATIN CALCIUM 20 MG PO TABS
20 MG | ORAL_TABLET | Freq: Every day | ORAL | 2 refills | Status: AC
Start: 2022-06-25 — End: 2023-07-21

## 2022-06-25 MED ORDER — METOPROLOL SUCCINATE ER 50 MG PO TB24
50 MG | ORAL_TABLET | Freq: Every day | ORAL | 2 refills | Status: AC
Start: 2022-06-25 — End: 2023-06-25

## 2022-06-25 NOTE — Progress Notes (Signed)
HPI: Audrey Snow (DOB: 1940/08/15)    Having more issues with pain and swelling right shoulder  Not sure if bursitis- fluid or soft tissue swelling anterior part of shoulder    Has known arthritis    Needs updated labs    Has f/u with Pulm and Dr Jenetta Loges in future and needs a scan    Problem List:  Patient Active Problem List   Diagnosis    Rheumatoid arthritis involving multiple sites with positive rheumatoid factor (HCC)    Dry skin dermatitis    Hyperlipidemia    Chronic right shoulder pain    Pre-ulcerative corn or callous    Mass of lower lobe of right lung    Vitamin D deficiency    Environmental allergies    HTN (hypertension)    Bilateral leg cramps    Gastroesophageal reflux disease without esophagitis    Large cell carcinoma of lung, right ([place])    Adenocarcinoma of right lung, 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)     RA    Bronchogenic cancer of right lung ([place]) 04/28/2020    adenocarcinoma    Chronic obstructive pulmonary disease ([place])     uses inhalers    Chronic pain of left knee 06/25/2018    Coronary atherosclerosis     noted on chest CT    COVID-19 vaccine series completed 08/15/2019    Pfizer; 07/29/2019 and booster given 03/12/2020    GERD (gastroesophageal reflux disease)     no meds at this time     H/O cardiovascular stress test     Bruce protocol, low risk study, EF of 67% and normal LV function    H/O mammogram 07/28/2016    mammogram and Ultrasound of right breaset- benign finding- [facility]     Hearing loss     "only hears out of left ear"    Hearing loss of both ears     no hearing aids     HTN (hypertension), benign 01/30/2014    controlled w/med    Hypothyroid     on med for control    Menopause     Osteoarthritis     Osteoporosis     prolia     Poor historian     patient had trouble remembering names of meds and what they were for and had to repeat instructions for surgery several times     Rheumatoid arthritis(714.0) 01/30/2014    followed by Dr. Debbra Riding. 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 Nancie Neas    Unspecified hypothyroidism 01/30/2014  thyroid medication    Vitamin D deficiency     medications for this        Allergies:  Allergies   Allergen Reactions    Yeast Nausea And Vomiting     Pt. Says she hasnt had issues with this in a long time    Yeast-Related Products Nausea And Vomiting and Headaches     Pt STATED NOT ALLERGIC       Current Medications:  Current Outpatient Medications   Medication Sig Dispense Refill    atorvastatin (LIPITOR) 20 MG tablet Take 1 tablet by mouth daily 90 tablet 2    levothyroxine (SYNTHROID) 100 MCG tablet Take 1 tablet by mouth every morning (before breakfast) 90 tablet 2    losartan (COZAAR) 100 MG tablet Take 1 tablet by mouth daily TAKE 1 TABLET BY MOUTH DAILY 90 tablet 2    metoprolol succinate (TOPROL XL) 50 MG extended release tablet Take 1 tablet by mouth daily 90 tablet 2    albuterol sulfate HFA (PROVENTIL;VENTOLIN;PROAIR) 108 (90 Base) MCG/ACT inhaler Inhale 1 puff into the lungs every 4 hours as needed for Shortness of Breath or Wheezing 18 g 11    Cholecalciferol 100 MCG (4000 UT) TABS Take 1 tablet by mouth daily      fluticasone (FLONASE) 50 MCG/ACT nasal spray 2 sprays by Nasal route daily PRN       umeclidinium-vilanterol (ANORO ELLIPTA) 62.5-25 MCG/ACT inhaler Inhale 1 puff into the lungs daily (Patient not taking: Reported on 06/25/2022) 1 each 11     No current facility-administered medications for this visit.       Review of Systems:  Review of Systems   Constitutional:  Positive for unexpected weight change.        Wt loss   HENT:  Positive for congestion.    Respiratory:  Positive for cough and shortness of breath.    Cardiovascular:  Negative for chest pain.   Musculoskeletal:  Positive for arthralgias.        Right shoulder pain   All other systems reviewed and are negative.    Vitals:  BP 128/70   Ht 1.702 m (5\' 7" )   Wt 68.2 kg (150 lb 6.4 oz)   BMI 23.56 kg/m     Physical Exam:  Physical Exam  Vitals reviewed.   Constitutional:       Appearance: Normal appearance.   HENT:      Head: Normocephalic and atraumatic.      Ears:      Comments: Very HOH  Eyes:      Extraocular Movements: Extraocular movements intact.      Pupils: Pupils are equal, round, and reactive to light.   Cardiovascular:      Rate and Rhythm: Normal rate and regular rhythm.      Heart sounds: Normal heart sounds.   Pulmonary:      Effort: Pulmonary effort is normal.      Breath sounds: Normal breath sounds.   Musculoskeletal:         General: Swelling present. Normal range of motion.      Cervical back: Normal range of motion and neck supple.      Comments: Decrease ROM of right shoulder and fluid collection anterior shoulder  Biceps tendon bursa     Skin:     General: Skin is warm and dry.   Neurological:      General: No focal deficit present.      Mental Status: She is  alert and oriented to person, place, and time.   Psychiatric:         Mood and Affect: Mood normal.         Behavior: Behavior normal.         Thought Content: Thought content normal.         Judgment: Judgment normal.        Assessment/Plan:   [name] was seen today for follow-up.    Diagnoses and all orders for this visit:    Acquired hypothyroidism  -      TSH; Future    Primary hypertension  -     CBC with Auto Differential; Future  -     Comprehensive Metabolic Panel; Future    Mixed hyperlipidemia  -     Lipid Panel; Future    Chronic obstructive pulmonary disease, unspecified COPD type (HCC)    Elevated blood sugar  -     Hemoglobin A1C; Future    Stage 3b chronic kidney disease ([place])  -     Comprehensive Metabolic Panel; Future    Chronic right shoulder pain  -     [facility]    Other orders  -     atorvastatin (LIPITOR) 20 MG tablet; Take 1 tablet by mouth daily  -     levothyroxine (SYNTHROID) 100 MCG tablet; Take 1 tablet by mouth every morning (before breakfast)  -     losartan (COZAAR) 100 MG tablet; Take 1 tablet by mouth daily TAKE 1 TABLET BY MOUTH DAILY  -     metoprolol succinate (TOPROL XL) 50 MG extended release tablet; Take 1 tablet by mouth daily      Will refer to ortho for her shoulder    Update her labs due to statin therapy and to monitor renal function    BP is controlled    Seeing Pulmonary    Watch sweets in diet and monitor A1C    For sinus and chest congestion recommend daily Mucinex and can also try saline nasal spray      Current medications are therapeutic at this time; continue as prescribed.        [name], MD

## 2022-06-26 LAB — LIPID PANEL
Chol/HDL Ratio: 2.9
Cholesterol, Total: 166 MG/DL (ref <200)
HDL: 57 MG/DL (ref 40–60)
LDL Calculated: 91.8 MG/DL (ref <100)
Triglycerides: 86 MG/DL (ref 35–150)
VLDL Cholesterol Calculated: 17.2 MG/DL (ref 6.0–23.0)

## 2022-06-26 LAB — CBC WITH AUTO DIFFERENTIAL
Absolute Immature Granulocyte: 0 10*3/uL (ref 0.0–0.5)
Basophils %: 1 % (ref 0.0–2.0)
Basophils Absolute: 0.1 10*3/uL (ref 0.0–0.2)
Eosinophils %: 2 % (ref 0.5–7.8)
Eosinophils Absolute: 0.1 10*3/uL (ref 0.0–0.8)
Hematocrit: 44.6 % (ref 35.8–46.3)
Hemoglobin: 14.5 g/dL (ref 11.7–15.4)
Immature Granulocytes: 0 % (ref 0.0–5.0)
Lymphocytes %: 29 % (ref 13–44)
Lymphocytes Absolute: 2.4 10*3/uL (ref 0.5–4.6)
MCH: 31.7 PG (ref 26.1–32.9)
MCHC: 32.5 g/dL (ref 31.4–35.0)
MCV: 97.4 FL (ref 82–102)
MPV: 10 FL (ref 9.4–12.3)
Monocytes %: 7 % (ref 4.0–12.0)
Monocytes Absolute: 0.6 10*3/uL (ref 0.1–1.3)
Neutrophils %: 61 % (ref 43–78)
Neutrophils Absolute: 5.1 10*3/uL (ref 1.7–8.2)
Platelets: 232 10*3/uL (ref 150–450)
RBC: 4.58 M/uL (ref 4.05–5.2)
RDW: 12.6 % (ref 11.9–14.6)
WBC: 8.2 10*3/uL (ref 4.3–11.1)
nRBC: 0 10*3/uL (ref 0.0–0.2)

## 2022-06-26 LAB — COMPREHENSIVE METABOLIC PANEL
ALT: 17 U/L (ref 12–65)
AST: 19 U/L (ref 15–37)
Albumin/Globulin Ratio: 1.1 (ref 0.4–1.6)
Albumin: 4 g/dL (ref 3.2–4.6)
Alk Phosphatase: 51 U/L (ref 50–136)
Anion Gap: 5 mmol/L (ref 2–11)
BUN: 17 MG/DL (ref 8–23)
CO2: 29 mmol/L (ref 21–32)
Calcium: 9.3 MG/DL (ref 8.3–10.4)
Chloride: 106 mmol/L (ref 103–113)
Creatinine: 1.6 MG/DL — ABNORMAL HIGH (ref 0.6–1.0)
Est, Glom Filt Rate: 32 mL/min/{1.73_m2} — ABNORMAL LOW (ref >60)
Globulin: 3.8 g/dL (ref 2.8–4.5)
Glucose: 111 mg/dL — ABNORMAL HIGH (ref 65–100)
Potassium: 4 mmol/L (ref 3.5–5.1)
Sodium: 140 mmol/L (ref 136–146)
Total Bilirubin: 1 MG/DL (ref 0.2–1.1)
Total Protein: 7.8 g/dL (ref 6.3–8.2)

## 2022-06-26 LAB — HEMOGLOBIN A1C
Hemoglobin A1C: 6 % — ABNORMAL HIGH (ref 4.8–5.6)
eAG: 126 mg/dL

## 2022-06-26 LAB — TSH: TSH, 3RD GENERATION: 8.71 u[IU]/mL — ABNORMAL HIGH (ref 0.358–3.740)

## 2022-06-27 MED ORDER — LEVOTHYROXINE SODIUM 125 MCG PO TABS
125 MCG | ORAL_TABLET | Freq: Every day | ORAL | 2 refills | Status: AC
Start: 2022-06-27 — End: ?

## 2022-07-08 ENCOUNTER — Ambulatory Visit: Admit: 2022-07-08 | Discharge: 2022-07-08 | Payer: MEDICARE | Attending: Nurse Practitioner | Primary: Internal Medicine

## 2022-07-08 DIAGNOSIS — J449 Chronic obstructive pulmonary disease, unspecified: Secondary | ICD-10-CM

## 2022-07-08 NOTE — Patient Instructions (Signed)
Please arrive 20 minutes prior to your appointment time at next visit.  This allows our staff to do benefit verification, any pre-visit testing, and medication review. This also allows the provider to walk into the exam room close to your scheduled appointment time.  It also prevents patients that are scheduled after you to have to wait to be seen.  We do our best to run on time and see patients at their scheduled appointment time.  If you arrive after your appointed arrival time, you may be asked to reschedule your appointment.

## 2022-07-08 NOTE — Progress Notes (Unsigned)
The patient is an extremely pleasant 82 year old female who is seen for follow up of COPD and history of adenocarcinoma.  She is accompanied by her granddaughter.  She has a history of vitamin D deficiency, hypothyroidism, RA (Dr. Louann Sjogren MTX), osteoporosis, HTN, extreme hearing loss, and GERD.  Her granddaughter assists with the history due to her severe hearing loss.  She has previously been followed by [facility] pulmonary for COPD.  She  has a 21 pack year history of cigarette smoking, but quit in 1985.       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 Clovis Pu.  She had a near syncopal episode in July which led to a CT of  chest which demonstrated an incidental finding of a 5 cm RLL mass.        Had IR biopsy of right lung mass which revealed adenocarcinoma.  Was presented at tumor board--recommendation was for surgical resection and adjuvant chemotherapy.  Had right VATS and RLLobectomy, mediastinal lymphadenctomy, and cryoablation of intercostal  nerves on 05/07/20 by Dr. Myrtice Lauth.  Had port placed 07/04/20.  Is followed by Dr. Mikal Plane.

## 2022-08-05 ENCOUNTER — Ambulatory Visit: Admit: 2022-08-05 | Payer: MEDICARE | Primary: Internal Medicine

## 2022-08-05 ENCOUNTER — Encounter
Admit: 2022-08-05 | Discharge: 2022-08-05 | Payer: MEDICARE | Attending: Orthopaedic Surgery | Primary: Internal Medicine

## 2022-08-05 DIAGNOSIS — M25511 Pain in right shoulder: Secondary | ICD-10-CM

## 2022-08-05 MED ORDER — METHYLPREDNISOLONE ACETATE 40 MG/ML IJ SUSP
40 | Freq: Once | INTRAMUSCULAR | Status: AC
Start: 2022-08-05 — End: 2022-08-05
  Administered 2022-08-05: 19:00:00 80 mg via INTRA_ARTICULAR

## 2022-08-05 NOTE — Progress Notes (Signed)
Name: Audrey Snow  Date of Birth: December 08, 1940  Gender: female  MRN: [id number]    CC:   Chief Complaint   Patient presents with    Shoulder Pain     Right shoulder    Right shoulder(s) pain, stiffness    HPI:  This patient presents with a chronic history of Right shoulder pain and limited motion.  She is right-handed.  Denies numbness or tingling.  Achy pain.  Does not seem to affect sleep.  Range of motion overall is pretty good.  She is here with her daughter.  Patient has RA and history of lung cancer.   Allergies   Allergen Reactions    Yeast Nausea And Vomiting     Pt. Says she hasnt had issues with this in a long time    Yeast-Related Products Nausea And Vomiting and Headaches     Pt STATED NOT ALLERGIC     Past Medical History:   Diagnosis Date    Arrhythmia 04/05/2020    PVCs.  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, frequent ventricular ectopy, runs nonsustained VT    Autoimmune disease ([place])     RA    Bronchogenic cancer of right lung ([place]) 04/28/2020    adenocarcinoma    Chronic obstructive pulmonary disease ([place])     uses inhalers    Chronic pain of left knee 06/25/2018    Coronary atherosclerosis     noted on chest CT    COVID-19 vaccine series completed 08/15/2019    Pfizer; 07/29/2019 and booster given 03/12/2020    GERD (gastroesophageal reflux disease)     no meds at this time    H/O cardiovascular stress test     Bruce protocol, low risk study, EF of 67% and normal LV function    H/O mammogram 07/28/2016    mammogram and Ultrasound of right breaset- benign finding- [facility]     Hearing loss     "only hears out of left ear"    Hearing loss of both ears     no hearing aids     HTN (hypertension), benign 01/30/2014    controlled w/med    Hypothyroid     on med for control    Menopause     Osteoarthritis     Osteoporosis     prolia     Poor historian     patient had trouble remembering names of meds and  what they were for and had to repeat instructions for surgery several times     Rheumatoid arthritis(714.0) 01/30/2014    followed by Dr. Debbra Riding. 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 Nancie Neas    Unspecified hypothyroidism 01/30/2014    thyroid medication    Vitamin D deficiency     medications for this      Past Surgical History:   Procedure Laterality Date    CARPAL TUNNEL RELEASE Bilateral     COLONOSCOPY      colon polyps-adenomatous, has seen Dr. Kristopher Glee in past    COLONOSCOPY N/A 01/27/2018    COLONOSCOPY/BMI 26 performed by Azalia Bilis, MD at [place] FLX DX W/[facility] WHEN PFRMD  01/27/2018         CT NEEDLE BIOPSY LUNG PERCUTANEOUS  04/16/2020    CT NEEDLE BIOPSY LUNG PERCUTANEOUS 04/16/2020 [facility]Y CT SCAN    HEMORRHOID SURGERY      OTHER SURGICAL HISTORY  09/2012    sinus surgery-Dr. Mickle Mallory    OTHER SURGICAL HISTORY  01/27/2013    Thymoma-Dr. Nancie Neas    TONSILLECTOMY  82 yrs old    UPPER GASTROINTESTINAL ENDOSCOPY  2015    Gastric AVM txed with cautery-Dr. Orpah Melter     Family History   Problem Relation Age of Onset    Osteoarthritis Mother     Breast Cancer Neg Hx     Alzheimer's Disease Father      Social History     Socioeconomic History    Marital status: Widowed     Spouse name: Not on file    Number of children: Not on file    Years of education: Not on file    Highest education level: Not on file   Occupational History    Not on file   Tobacco Use    Smoking status: Former     Current packs/day: 0.00     Average packs/day: 1 pack/day for 24.0 years (24.0 ttl pk-yrs)     Types: Cigarettes     Start date: 07/08/1959     Quit date: 07/08/1983     Years since quitting: 39.1    Smokeless tobacco: Never    Tobacco comments:     Quit smoking: not regular when first started- 1pk/week   Substance and Sexual Activity    Alcohol use: No     Alcohol/week: 0.0 standard drinks of alcohol    Drug use: No    Sexual activity: Not  Currently     Partners: Male   Other Topics Concern    Not on file   Social History Narrative    Widowed since 06/29/21. 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     Social Determinants of Health     Financial Resource Strain: Low Risk  (10/15/2021)    Overall Financial Resource Strain (CARDIA)     Difficulty of Paying Living Expenses: Not hard at all   Food Insecurity: Not on file (10/15/2021)   Transportation Needs: Unknown (10/15/2021)    PRAPARE - Armed forces logistics/support/administrative officer (Medical): Not on file     Lack of Transportation (Non-Medical): No   Physical Activity: Not on file   Stress: Not on file   Social Connections: Not on file   Intimate Partner Violence: Not on file   Housing Stability: Unknown (10/15/2021)    Housing Stability Vital Sign     Unable to Pay for Housing in the Last Year: Not on file     Number of Places Lived in the Last Year: Not on file     Unstable Housing in the Last Year: No               No data to display                  Review of Systems  HTN, GERD,CAD, hypothyroidism, osteoporosis, hx lung cancer, rheumatoid arthritis  Also noted on the patient medical history form on the chart and are reviewed today.  Pertinent positives and negatives are addressed with the patient, particularly those related to musculoskeletal concerns.  Non-orthopaedic concerns were referred back to the primary care physician.    PE:    GEN: General appearance is that of a healthy patient, alert and oriented, in no distress.  WDWN.  HEENT:  Normocephalic, atraumatic.  Extraocular muscles are intact.  Neck:  Supple, no lymphadenopathy.  Heart:  Regular pulse exam on all extremities.  Good color and warmth noted.  Lungs:  Normal non-labored breathing with no obvious shortness of breath.  Abdomen:  WNL's.  Skin:  No signs of rash or bruising.    Pysch: Answers questions appropriately, AO X 3.  MSK:  Cervical spine: No tenderness.  Normal active motion. normal Spurling's maneuver.     SHOULDER   Right (involved)   left   Skin Intact Intact   Radial Pulses 2+ symmetrical  2+ symmetrical   Myotomes Normal Normal   Dermatomes  Normal Normal   ROM Symmetric Full   Strength No weakness No weakness   Atrophy None noted None noted   Effusion/Swelling  None None   Palpation Posterior tenderness No Tenderness   Bicep Tendon Rupture  - Negative   Bear Hug, Belly Press - Not tested   Crossed Arm Adduction Test Not tested Not tested   Instability/Ant. Apprehension Test None  None   Impingement limited Negative          X-rays:   AP, Grashey, outlet and axillary views of the Right shoulder were obtained today and reviewed in the office. They show severe glenohumeral arthritis with inferior osteophyte seen on the humeral head.  There is central erosion and sclerotic change in the glenoid and humeral head.  Inferior osteophytes that are small on the humeral head as well as glenoid.  Cystic change superiorly.  Acromial hook.  No proximal humeral migration.[place] impression:  Right shoulder moderate arthritis    A/Plan:     ICD-10-CM    1. Right shoulder pain, unspecified chronicity  M25.511 XR SHOULDER RIGHT (MIN 2 VIEWS)      2. Arthritis of right shoulder region  M19.011           I had a long discussion with the patient regarding the natural history of the problem, as well as treatment options.     Treatment:   Procedure note: After discussion of risks and benefits including, but not limited to pain, infection, steroid flare, increased blood sugar, fat necrosis, skin discoloration, and injury to blood vessels or nerves, the patient verbally consented to proceed with a glenohumeral joint injection.  The affected right shoulder was sterilely prepped in standard fashion and injected with 2 cc of depo medrol  (40mg /ml), 2 cc of 1% Lidocaine, and 2 cc of 0.5% Marcaine into the JOINT SPACE.  The patient tolerated the injection well.   Discussed topical NSAID such as Voltaren gel.  A handout from the [employer] on total shoulder replacement was  provided.    Patient is of increased medical complexity and increased risk for surgical intervention secondary to HTN, GERD,CAD, hypothyroidism, osteoporosis, hx lung cancer, rheumatoid arthritis    Return in about 6 weeks (around 09/16/2022).    Thank you for the opportunity to see and take care of your patients. If you ever have any questions please give me a call.   Sincerely,  Alyson Ingles, MD  08/05/22

## 2022-08-05 NOTE — Patient Instructions (Signed)
Shoulder Joint Replacement  Although shoulder joint replacement is less common than knee or hip replacement, it is just as successful in relieving joint pain.    Shoulder replacement surgery was first performed in the [place] in the 1950s to treat severe shoulder fractures. Over the years, shoulder joint replacement has come to be used for many other painful conditions of the shoulder, such as different forms of arthritis.    Today, about 53,000 people in the [place] have shoulder replacement surgery each year, according to the [employer]. This compares to more than 900,000 Americans a year who have knee and hip replacement surgery.    If nonsurgical treatments like medications and activity changes are no longer helpful for relieving pain, you may want to consider shoulder joint replacement surgery. Joint replacement surgery is a safe and effective procedure to relieve pain and help you resume everyday activities.    Whether you have just begun exploring treatment options or have already decided to have shoulder joint replacement surgery, this article will help you understand more about this valuable procedure.    Anatomy  Your shoulder is made up of three bones: your upper arm bone (humerus), your shoulder blade (scapula), and your collarbone (clavicle). The shoulder is a ball-and-socket joint: The ball, or head, of your upper arm bone fits into a shallow socket in your shoulder blade. This socket is called the glenoid.      The surfaces of the bones where they touch are covered with articular cartilage, a smooth substance that protects the bones and enables them to move easily. A thin, smooth tissue called synovial membrane covers all remaining surfaces inside the shoulder joint. In a healthy shoulder, this membrane makes a small amount of fluid that lubricates the cartilage and eliminates almost any friction in your shoulder.    The muscles and tendons that surround the  shoulder provide stability and support.    All of these structures allow the shoulder to rotate through a greater range of motion than any other joint in the body.    Description  In shoulder replacement surgery, the damaged parts of the shoulder are removed and replaced with artificial components, called a prosthesis.    The treatment options are either replacement of just the head of the humerus bone (ball), or replacement of both the ball and the socket (glenoid).      Cause  Several conditions can cause shoulder pain and disability, and lead patients to consider shoulder joint replacement surgery.    Osteoarthritis (Degenerative Joint Disease)  Osteoarthritis is an age-related wear-and-tear type of arthritis. It usually occurs in people 62 years of age and older, but may occur in younger people, too. The cartilage that cushions the bones of the shoulder softens and wears away. The bones then rub against one another. Over time, the shoulder joint slowly becomes stiff and painful.    Unfortunately, there is no way to prevent the development of osteoarthritis. It is a common reason people have shoulder replacement surgery.      Rheumatoid Arthritis  This is a disease in which the synovial membrane that surrounds the joint becomes inflamed and thickened. This chronic inflammation can damage the cartilage and eventually cause cartilage loss, pain, and stiffness. Rheumatoid arthritis is the most common form of a group of disorders termed inflammatory arthritis.    Post-traumatic Arthritis  This can follow a serious shoulder injury. Fractures of the bones that make up the shoulder or tears  of the shoulder tendons or ligaments may damage the articular cartilage over time. This causes shoulder pain and limits shoulder function.    Rotator Cuff Tear Arthropathy  A patient with a very large, long-standing rotator cuff tear may develop cuff tear arthropathy. In this condition, the changes in the shoulder joint due to the  rotator cuff tear may lead to arthritis and destruction of the joint cartilage.    Avascular Necrosis (Osteonecrosis)  Avascular necrosis, or osteonecrosis, is a painful condition that occurs when the blood supply to the bone is disrupted. Because bone cells die without a blood supply, osteonecrosis can ultimately cause destruction of the shoulder joint and lead to arthritis. Chronic steroid use, deep sea diving, severe fracture of the shoulder, sickle cell disease, and heavy alcohol use are all risk factors for avascular necrosis.    Severe Fractures  A severe fracture of the shoulder is another common reason people have shoulder replacements. When the head of the upper arm bone is shattered, it may be very difficult for a doctor to put the pieces of bone back in place. In addition, the blood supply to the bone pieces can be interrupted. In this case, a surgeon may recommend a shoulder replacement. Older patients with osteoporosis are most at risk for severe shoulder fractures.    Failed Previous Shoulder Replacement Surgery  Although uncommon, some shoulder replacements fail, most often because of implant loosening, wear, infection, and dislocation. When this occurs, a second joint replacement surgery -- called a revision surgery -- may be necessary.    Is Shoulder Joint Replacement for You?  The decision to have shoulder replacement surgery should be a cooperative one between you, your family, your family physician, and your orthopaedic surgeon.    There are several reasons why your doctor may recommend shoulder replacement surgery. People who benefit from surgery often have:    Severe shoulder pain that interferes with everyday activities, such as reaching into a cabinet, dressing, toileting, and washing.  Moderate to severe pain while resting. This pain may be severe enough to prevent a good night's sleep.  Loss of motion and/or weakness in the shoulder.  Failure to substantially improve with other treatments  such as anti-inflammatory medications, cortisone injections, and/or physical therapy.    Orthopaedic Evaluation  Your family physician may refer you to an orthopaedic surgeon for a thorough evaluation to determine if you can benefit from this surgery.    An evaluation with an orthopaedic surgeon consists of several components:    A medical history. Your orthopaedic surgeon will gather information about your general health and ask you about the extent of your shoulder pain and your ability to function.  A physical examination. This will assess shoulder motion, stability, and strength.  X-rays. X-rays help to determine the extent of damage in your shoulder. They can show loss of the normal joint space between bones, flattening or irregularity in the shape of the bone, bone spurs, and loose pieces of cartilage or bone that may be floating inside the joint.  Other tests. Occasionally, your doctor may order blood tests, a computed tomography (CT) scan, a magnetic resonance imaging (MRI) scan, or a bone scan to determine the condition of the bone and soft tissues of your shoulder.    Your orthopaedic surgeon will review the results of your evaluation with you and discuss whether shoulder joint replacement is the best method to relieve your pain and improve your function. Other treatment options -- including medications, injections, physical  therapy, or other types of surgery -- will also be discussed and considered.      Shoulder Replacement Options  Shoulder replacement surgery is highly technical. It should be performed by a surgical team with experience in this procedure.    There are different types of shoulder replacements. Your surgeon will evaluate your situation carefully before making any decisions. They will discuss with you which type of replacement will best meet your health needs. Do not hesitate to ask which type of implant will be used in your situation, and why that choice is right for you.    Total  Shoulder Replacement  The standard total shoulder replacement involves replacing the arthritic joint surfaces with a highly polished metal ball attached to a stem, and a plastic socket.      These components come in various sizes. They may be either cemented or press fit into the bone. If the bone is of good quality, your surgeon may choose to use a non-cemented (press-fit) humeral component. If the bone is soft, the humeral component may be implanted with bone cement. In most cases, an all-plastic glenoid (socket) component is implanted with bone cement.    Implantation of a glenoid component is not advised if:    The glenoid has good cartilage  The glenoid bone is severely deficient  The rotator cuff tendons are irreparably torn  Patients with bone-on-bone osteoarthritis and intact rotator cuff tendons are generally good candidates for conventional total shoulder replacement.      Stemmed Hemiarthroplasty  Depending on the condition of your shoulder, your surgeon may replace only the ball. This procedure is called a hemiarthroplasty. In a traditional hemiarthroplasty, the surgeon replaces the head of the humerus with a metal ball and stem, similar to the component used in a total shoulder replacement. This is called a stemmed hemiarthroplasty.    Some surgeons recommend hemiarthroplasty when the humeral head is severely fractured but the socket is normal. Other indications for a hemiarthroplasty include:    Arthritis that involves only the head of the humerus, with a glenoid that has a healthy and intact cartilage surface  Shoulders with severely weakened bone in the glenoid  Some shoulders with severely torn rotator cuff tendons and arthritis  Sometimes, surgeons make the decision between a total shoulder replacement and a hemiarthroplasty in the operating room at the time of the surgery.    Studies show that patients with osteoarthritis get better pain relief from total shoulder arthroplasty than from  hemiarthroplasty.    Resurfacing Hemiarthroplasty  Resurfacing hemiarthroplasty involves replacing just the joint surface of the humeral head with a cap-like prosthesis without a stem. With its bone-preserving advantage, it offers those with arthritis of the shoulder an alternative to the standard stemmed shoulder replacement.    Resurfacing hemiarthroplasty may be an option for you if:    The glenoid still has an intact cartilage surface  There has been no fresh fracture of the humeral neck or head  There is a desire to preserve humeral bone  For patients who are young or very active, resurfacing hemiarthroplasty avoids the risks of component wear and loosening that may occur with conventional total shoulder replacements in this patient population. Due to its more conservative nature, resurfacing hemiarthroplasty may be easier to convert to total shoulder replacement, if necessary, at a later time.      Reverse Total Shoulder Replacement  Another type of shoulder replacement is called reverse total shoulder replacement. Reverse total shoulder replacement is used  for people who have:    Completely torn rotator cuffs with severe arm weakness  The effects of severe arthritis and rotator cuff tearing (cuff tear arthropathy)  Had a previous shoulder replacement that failed    For these individuals, a conventional total shoulder replacement can still leave them with pain. They may also be unable to lift their arm up past a 90-degree angle. Not being able to lift your arm away from the side can be severely debilitating.    In reverse total shoulder replacement, the socket and metal ball are switched: A metal ball is attached to the shoulder bone, and a plastic socket is attached to the upper arm bone. This allows the patient to use the deltoid muscle instead of the torn rotator cuff to lift the arm.      Complications  Your orthopaedic surgeon will explain the potential risks and complications of shoulder joint  replacement, including those related to the surgery itself and those that can occur over time after your surgery.    When complications occur, most are successfully treatable. Possible complications include the following.    Infection. Infection is a complication of any surgery. In shoulder joint replacement, infection may occur in the wound or deep around the prosthesis. It may happen while in the hospital or after you go home. It may even occur years later. Minor infections in the wound area are generally treated with antibiotics. Major or deep infections may require more surgery and removal of the prosthesis. Any infection in your body can spread to your joint replacement.  Prosthesis Problems. Although prosthesis designs and materials, as well as surgical techniques, continue to advance, the prosthesis may wear down and the components may loosen. The components of a shoulder replacement may also dislocate. Excessive wear, loosening, or dislocation may require additional surgery (revision procedure).  Nerve damage. Nerves in the vicinity of the joint replacement may be damaged during surgery, although this type of injury is infrequent. Over time, these nerve injuries often improve and may completely recover.    Preparing for Surgery  Medical Evaluation  If you decide to have shoulder replacement surgery, your orthopaedic surgeon may ask you to schedule a complete physical examination with your family physician several weeks before surgery. This is needed to make sure you are healthy enough to have the surgery and complete the recovery process. Many patients with chronic medical conditions, like heart disease, must also be evaluated by a specialist, such as a cardiologist, before the surgery.    Medications  Be sure to talk to your orthopaedic surgeon about the medications you take. Some medications may need to be stopped before surgery. For example, the following over-the-counter medicines may cause excessive  bleeding and should be stopped 2 weeks before surgery:    Non-steroidal anti-inflammatory drugs (NSAIDs), such as aspirin, ibuprofen, and naproxen  Most arthritis medications    If you take blood thinners, either your primary care doctor or cardiologist will advise you about stopping these medications before surgery.    Home Planning  Making simple changes in your home before surgery can make your recovery period easier.    For the first several weeks after your surgery, it will be hard to reach high shelves and cupboards. Before your surgery, be sure to go through your home and place any items you may need afterwards on low shelves.    When you come home from the hospital, you will need help for a few weeks with some daily tasks  like dressing, bathing, cooking, and laundry. If you will not have any support at home immediately after surgery, you may need a short stay in a rehabilitation facility until you become more independent.    Your Surgery  Before Your Operation  Wear loose-fitting clothes and a button-front shirt when you go to the hospital for your surgery. After surgery, you will be wearing a sling and will have limited use of your arm.    You will most likely be admitted to the hospital on the day of your surgery. After admission, you will be taken to the preoperative preparation area and will meet a doctor from the anesthesia department.    You, your anesthesiologist, and your surgeon will discuss the type of anesthesia to be used. You may be provided a general anesthetic (you are asleep for the entire operation), a regional anesthetic (you may be awake but have no feeling around the surgical area), or a combination of both types.    Surgical Procedure  The procedure to replace your shoulder joint with an artificial device usually takes about 2 hours.    After surgery, you will be moved to the recovery room, where you will remain for several hours while your recovery from anesthesia is monitored. After  you wake up, you will be taken to your hospital room.    Recovery  Your medical team will give you several doses of antibiotics to prevent infection. Most patients are able to eat solid food and get out of bed the day after surgery. You will most likely be able to go home on the first, second, or third day after surgery.    Pain Management  After surgery, you will feel some pain. This is a natural part of the healing process. Your doctor and nurses will work to reduce your pain, which can help you recover from surgery faster.    Medications are often prescribed for short-term pain relief after surgery. Many types of medicines are available to help manage pain, including opioids, non-steroidal anti-inflammatory drugs (NSAIDs), and local anesthetics. Your doctor may use a combination of these medications to improve pain relief, as well as minimize the need for opioids.    Be aware that although opioids help relieve pain after surgery, they are a narcotic and can be addictive. Opioid dependency and overdose has become a critical public health issue in the [place] It is important to use opioids only as directed by your doctor and to stop taking them as soon as your pain begins to improve. Talk to your doctor if your pain has not begun to improve within a few days of your surgery.    Pain management is an important part of your recovery. You will begin physical therapy soon after surgery, and when you feel less pain, you can start moving sooner and get your strength back more quickly. Talk with your doctor if postoperative pain becomes a problem.    Rehabilitation  A careful, well-planned rehabilitation program is critical to the success of a shoulder replacement. You usually start gentle physical therapy soon after the operation. Your surgeon or physical therapist will provide you with a home exercise program to strengthen your shoulder and improve flexibility.    Your Recovery At Home  When you leave the hospital, your  arm will be in a sling. You will need the sling to support and protect your shoulder for the first 2 to 6 weeks after surgery, depending on the complexity of your surgery and your  surgeon's preference.      Wound care. You will have staples running along your wound or a suture beneath your skin. The staples will be removed several weeks after surgery. A dissolving suture beneath your skin will not require removal.    Avoid soaking the wound in water until it has thoroughly sealed and dried. You may continue to bandage the wound to prevent irritation from clothing.    Activity. Exercise is a critical component of home care, particularly during the first few weeks after surgery. Follow your surgeon's home exercise plan to help you regain strength. Most patients are able to perform simple activities such as eating, dressing, and grooming, within 2 weeks after surgery. Some pain with activity and at night is common for several weeks after surgery.    You are not allowed to drive a car for 2 to 6 weeks after surgery.    Do's and Don'ts  The success of your surgery will depend largely on how well you follow your orthopaedic surgeon's instructions at home during the first few weeks after surgery. Here are some common do's and don'ts for when you return home:    Don't use the arm to push yourself up in bed or from a chair because this requires forceful contraction of muscles.  Do follow the program of home exercises prescribed for you. You may need to do the exercises 2 to 3 times a day for a month or more.  Don't overdo it! If your shoulder pain was severe before the surgery, the experience of pain-free motion may lull you into thinking that you can do more than is prescribed. Early overuse of the shoulder may result in severe limitations in motion.  Don't lift anything heavier than a glass of water for the first 2 to 4 weeks after surgery.  Do ask for assistance. Your physician may be able to recommend an agency or  facility to help if you do not have home support.  Don't participate in contact sports or do any repetitive heavy lifting after your shoulder replacement.  Do avoid placing your arm in any extreme position, such as straight out to the side or behind your body for the first 6 weeks after surgery.    Many thousands of patients have experienced an improved quality of life after shoulder joint replacement surgery. They experience less pain, improved motion and strength, and better function.    Research  There are ongoing efforts to design and develop newer and better shoulder replacements that can be performed with less invasive surgical techniques. And researchers are collecting data to determine which patients are the best candidates for which type of shoulder replacement surgery. This information will allow your surgeon to offer you the best recommendation for the treatment of your arthritic shoulder.    To help doctors in the management of glenohumeral joint arthritis -- the [employer] has conducted research to provide some useful guidelines. These are recommendations only and may not apply to every case. For more information: Glenohumeral Joint Osteoarthritis - Clinical Practice Guideline (CPG)  [employer] (aaos.org)

## 2022-09-02 ENCOUNTER — Inpatient Hospital Stay: Admit: 2022-09-02 | Payer: MEDICARE | Primary: Internal Medicine

## 2022-09-02 DIAGNOSIS — C3491 Malignant neoplasm of unspecified part of right bronchus or lung: Secondary | ICD-10-CM

## 2022-09-02 LAB — POCT CREATININE - BLOOD
POC Creatinine: 1.34 mg/dL (ref 0.8–1.5)
eGFR, POC: 39 mL/min/{1.73_m2} — ABNORMAL LOW (ref >60)

## 2022-09-02 MED ORDER — IOPAMIDOL 76 % IV SOLN
76 | Freq: Once | INTRAVENOUS | Status: AC | PRN
Start: 2022-09-02 — End: 2022-09-02
  Administered 2022-09-02: 17:00:00 100 mL via INTRAVENOUS

## 2022-09-02 MED ORDER — DIATRIZOATE MEGLUMINE & SODIUM 66-10 % PO SOLN
66-10 % | Freq: Once | ORAL | Status: AC | PRN
Start: 2022-09-02 — End: 2022-09-05
  Administered 2022-09-02: 17:00:00 15 mL via ORAL

## 2022-09-02 MED FILL — GASTROGRAFIN 66-10 % PO SOLN: 66-10 % | ORAL | Qty: 30

## 2022-09-02 MED FILL — ISOVUE-370 76 % IV SOLN: 76 % | INTRAVENOUS | Qty: 100

## 2022-09-05 ENCOUNTER — Encounter

## 2022-09-09 ENCOUNTER — Inpatient Hospital Stay: Admit: 2022-09-09 | Payer: MEDICARE | Primary: Internal Medicine

## 2022-09-09 ENCOUNTER — Encounter: Admit: 2022-09-09 | Payer: MEDICARE | Attending: Internal Medicine | Primary: Internal Medicine

## 2022-09-09 DIAGNOSIS — C3491 Malignant neoplasm of unspecified part of right bronchus or lung: Secondary | ICD-10-CM

## 2022-09-09 LAB — CBC WITH AUTO DIFFERENTIAL
Absolute Immature Granulocyte: 0 10*3/uL (ref 0.0–0.5)
Basophils %: 1 % (ref 0.0–2.0)
Basophils Absolute: 0.1 10*3/uL (ref 0.0–0.2)
Eosinophils %: 2 % (ref 0.5–7.8)
Eosinophils Absolute: 0.1 10*3/uL (ref 0.0–0.8)
Hematocrit: 44.4 % (ref 35.8–46.3)
Hemoglobin: 14 g/dL (ref 11.7–15.4)
Immature Granulocytes: 0 % (ref 0.0–5.0)
Lymphocytes %: 38 % (ref 13–44)
Lymphocytes Absolute: 2.6 10*3/uL (ref 0.5–4.6)
MCH: 30.5 PG (ref 26.1–32.9)
MCHC: 31.5 g/dL (ref 31.4–35.0)
MCV: 96.7 FL (ref 82.0–102.0)
MPV: 10.2 FL (ref 9.4–12.3)
Monocytes %: 8 % (ref 4.0–12.0)
Monocytes Absolute: 0.5 10*3/uL (ref 0.1–1.3)
Neutrophils %: 51 % (ref 43–78)
Neutrophils Absolute: 3.4 10*3/uL (ref 1.7–8.2)
Platelets: 210 10*3/uL (ref 150–450)
RBC: 4.59 M/uL (ref 4.05–5.2)
RDW: 13.7 % (ref 11.9–14.6)
WBC: 6.7 10*3/uL (ref 4.3–11.1)
nRBC: 0 10*3/uL (ref 0.0–0.2)

## 2022-09-09 LAB — COMPREHENSIVE METABOLIC PANEL
ALT: 15 U/L (ref 12–65)
AST: 18 U/L (ref 15–37)
Albumin/Globulin Ratio: 0.9 (ref 0.4–1.6)
Albumin: 3.7 g/dL (ref 3.2–4.6)
Alk Phosphatase: 57 U/L (ref 50–136)
Anion Gap: 5 mmol/L (ref 2–11)
BUN: 20 MG/DL (ref 8–23)
CO2: 28 mmol/L (ref 21–32)
Calcium: 8.9 MG/DL (ref 8.3–10.4)
Chloride: 105 mmol/L (ref 103–113)
Creatinine: 1.6 MG/DL — ABNORMAL HIGH (ref 0.6–1.0)
Est, Glom Filt Rate: 32 mL/min/{1.73_m2} — ABNORMAL LOW (ref >60)
Globulin: 3.9 g/dL (ref 2.8–4.5)
Glucose: 110 mg/dL — ABNORMAL HIGH (ref 65–100)
Potassium: 3.9 mmol/L (ref 3.5–5.1)
Sodium: 138 mmol/L (ref 136–146)
Total Bilirubin: 1.3 MG/DL — ABNORMAL HIGH (ref 0.2–1.1)
Total Protein: 7.6 g/dL (ref 6.3–8.2)

## 2022-09-09 LAB — VITAMIN D 25 HYDROXY: Vit D, 25-Hydroxy: 43.3 ng/mL (ref 30.0–100.0)

## 2022-09-09 NOTE — Patient Instructions (Addendum)
Patient Instructions from Today's Visit    Reason for Visit:  Follow up Lung Cancer     Diagnosis Information:  https://www.cancer.net/about-us/asco-answers-patient-education-materials/asco-answers-fact-sheets    Plan:  Lab results and symptoms reviewed. CT scan reviewed, no sign of recurrence. We will repeat this scan in 1 year and see you back after to review.    Please call Radiology Scheduling at [phone] to schedule your CT scan next year. Please schedule this at least 3 days prior to your office visit with Dr. Jenetta Loges to review.      Follow Up:  Follow up 1 year.    Recent Lab Results:  Hospital Outpatient Visit on 09/09/2022   Component Date Value Ref Range Status    Sodium 09/09/2022 138  136 - 146 mmol/L Final    Potassium 09/09/2022 3.9  3.5 - 5.1 mmol/L Final    Chloride 09/09/2022 105  103 - 113 mmol/L Final    CO2 09/09/2022 28  21 - 32 mmol/L Final    Anion Gap 09/09/2022 5  2 - 11 mmol/L Final    Glucose 09/09/2022 110 (H)  65 - 100 mg/dL Final    BUN 09/09/2022 20  8 - 23 MG/DL Final    Creatinine 09/09/2022 1.60 (H)  0.6 - 1.0 MG/DL Final    Est, Glom Filt Rate 09/09/2022 32 (L)  >60 ml/min/1.4m Final    Comment:    Pediatric calculator link: https://www.kidney.org/professionals/kdoqi/gfr_calculatorped     These results are not intended for use in patients <164years of age.     eGFR results are calculated without a race factor using  the 2021 CKD-EPI equation. Careful clinical correlation is recommended, particularly when comparing to results calculated using previous equations.  The CKD-EPI equation is less accurate in patients with extremes of muscle mass, extra-renal metabolism of creatinine, excessive creatine ingestion, or following therapy that affects renal tubular secretion.      Calcium 09/09/2022 8.9  8.3 - 10.4 MG/DL Final    Total Bilirubin 09/09/2022 1.3 (H)  0.2 - 1.1 MG/DL Final    ALT 09/09/2022 15  12 - 65 U/L Final    AST 09/09/2022 18  15 - 37 U/L Final    Alk Phosphatase  09/09/2022 57  50 - 136 U/L Final    Total Protein 09/09/2022 7.6  6.3 - 8.2 g/dL Final    Albumin 09/09/2022 3.7  3.2 - 4.6 g/dL Final    Globulin 09/09/2022 3.9  2.8 - 4.5 g/dL Final    Albumin/Globulin Ratio 09/09/2022 0.9  0.4 - 1.6   Final    WBC 09/09/2022 6.7  4.3 - 11.1 K/uL Final    RBC 09/09/2022 4.59  4.05 - 5.2 M/uL Final    Hemoglobin 09/09/2022 14.0  11.7 - 15.4 g/dL Final    Hematocrit 09/09/2022 44.4  35.8 - 46.3 % Final    MCV 09/09/2022 96.7  82.0 - 102.0 FL Final    MCH 09/09/2022 30.5  26.1 - 32.9 PG Final    MCHC 09/09/2022 31.5  31.4 - 35.0 g/dL Final    RDW 09/09/2022 13.7  11.9 - 14.6 % Final    Platelets 09/09/2022 210  150 - 450 K/uL Final    MPV 09/09/2022 10.2  9.4 - 12.3 FL Final    nRBC 09/09/2022 0.00  0.0 - 0.2 K/uL Final    **Note: Absolute NRBC parameter is now reported with Hemogram**    Neutrophils % 09/09/2022 51  43 - 78 % Final  Lymphocytes % 09/09/2022 38  13 - 44 % Final    Monocytes % 09/09/2022 8  4.0 - 12.0 % Final    Eosinophils % 09/09/2022 2  0.5 - 7.8 % Final    Basophils % 09/09/2022 1  0.0 - 2.0 % Final    Immature Granulocytes 09/09/2022 0  0.0 - 5.0 % Final    Neutrophils Absolute 09/09/2022 3.4  1.7 - 8.2 K/UL Final    Lymphocytes Absolute 09/09/2022 2.6  0.5 - 4.6 K/UL Final    Monocytes Absolute 09/09/2022 0.5  0.1 - 1.3 K/UL Final    Eosinophils Absolute 09/09/2022 0.1  0.0 - 0.8 K/UL Final    Basophils Absolute 09/09/2022 0.1  0.0 - 0.2 K/UL Final    Absolute Immature Granulocyte 09/09/2022 0.0  0.0 - 0.5 K/UL Final    Differential Type 09/09/2022 AUTOMATED    Final     Treatment Summary has been discussed and given to patient:   N/A  -------------------------------------------------------------------------------------------------------------------  Please call our office at [phone] if you have any  of the following symptoms:   Fever of 100.5 or greater  Chills  Shortness of breath  Swelling or pain in one leg    After office hours an answering service  is available and will contact a provider for emergencies or if you are experiencing any of the above symptoms.    Patient does express an interest in My Chart.  My Chart log in information explained on the after visit summary printout at the [facility] desk.    Daouda Lonzo, RN

## 2022-09-09 NOTE — Progress Notes (Signed)
[facility]: Office Visit Established Patient    Chief Complaint:    Chief Complaint   Patient presents with    Follow-up         History of Present Illness:  Audrey Snow is a 82 y.o. female who returns today for management of adenocarcinoma of the  lung.  On 02/27/20, Audrey Snow presented to [facility] ED with complaints of achiness in chest with radiation of pain to her left upper extremity with association of N/V, SOB and diaphoresis. Cardiac work-up was negative; however her chest x-ray reported a 5.1 cm rounded opacity at the right lung base.  She saw her PCP on 02/29/20, CT scan was ordered and reported a 5 cm mass with adjacent pleural nodularity posteriorly at the right lung base and considered suspicious for bronchogenic carcinoma. On 04/02/20 she  had a PET/CT scan that reported a 6 cm right lower lobe pulmonary mass with no metastatic disease.  Biopsy showed adenocarcinoma of lung origin.  We saw her prior to surgical evaluation and recommended proceeding with lobectomy and lymphadenectomy if  she was a surgical candidate.  She completed VATS with left lower lobectomy as well as chest wall mass resection and lymphadenectomy on 05/07/20.  Her tumor was 6.4 cm in greatest dimension with carcinoma in the specimen marked "chest wall" but with separation from tumor by connective tissue and lung parenchyma.  13 lymph node fragments were all negative for tumor.  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  Because of the T3N0 disease, we would consider adjuvant  chemotherapy, likely cisplatin and pemetrexed for 4 cycles.  We do also have the ALCHEMIST trial available, but unfortunately she is not a candidate due to her history of RA with MTX use.  She completed 4 cycles of cisplatin and Alimta with adequate tolerance.  CT chest at the conclusion of therapy reviewed and shows nothing of suspicion in the lung.  However, she does have a 1.4 cm adrenal nodule  which was not obviously present on pretherapy imaging.  We recommended PET/CT to assess but this shows no significant uptake in the left adrenal or other areas of concern.  We will continue observation with repeat CT in 3 months.     Here for follow-up, accompanied by her granddaughter.  No major changes, she is doing well overall.  ECOG 0.  No new symptoms of concerns.       Review of Systems:  Constitutional: Positive for fatigue.   HENT: Negative.   Eyes: Negative.   Respiratory: Negative.   Cardiovascular: Negative.   Gastrointestinal: Negative.   Genitourinary: Negative.   Musculoskeletal: Negative.   Skin: Negative.   Neurological: Negative.   Endo/Heme/Allergies: Negative.   Psychiatric/Behavioral: Negative.   All other systems reviewed and are negative.       Allergies   Allergen Reactions    Yeast Nausea And Vomiting     Pt. Says she hasnt had issues with this in a long time    Yeast-Related Products Nausea And Vomiting and Headaches     Pt STATED NOT ALLERGIC     Past Medical History:   Diagnosis Date    Arrhythmia 04/05/2020    PVCs.  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, frequent ventricular ectopy, runs nonsustained VT    Autoimmune disease ([place])  RA    Bronchogenic cancer of right lung ([place]) 04/28/2020    adenocarcinoma    Chronic obstructive pulmonary disease (H[facility])     uses inhalers    Chronic pain of left knee 06/25/2018    Coronary atherosclerosis     noted on chest CT    COVID-19 vaccine series completed 08/15/2019    Pfizer; 07/29/2019 and booster given 03/12/2020    GERD (gastroesophageal reflux disease)     no meds at this time    H/O cardiovascular stress test     Bruce protocol, low risk study, EF of 67% and normal LV function    H/O mammogram 07/28/2016    mammogram and Ultrasound of right breaset- benign finding- [facility]     Hearing loss     "only hears out of left ear"    Hearing loss of  both ears     no hearing aids     HTN (hypertension), benign 01/30/2014    controlled w/med    Hypothyroid     on med for control    Menopause     Osteoarthritis     Osteoporosis     prolia     Poor historian     patient had trouble remembering names of meds and what they were for and had to repeat instructions for surgery several times     Rheumatoid arthritis(714.0) 01/30/2014    followed by Dr. Debbra Riding. 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 Kristopher Glee in past    COLONOSCOPY N/A 01/27/2018    COLONOSCOPY/BMI 26 performed by Azalia Bilis, MD at [place] FLX DX W/COLLJ SPEC WHEN PFRMD  01/27/2018         CT NEEDLE BIOPSY LUNG PERCUTANEOUS  04/16/2020    CT NEEDLE BIOPSY LUNG PERCUTANEOUS 04/16/2020 [facility] CT SCAN    HEMORRHOID SURGERY      OTHER SURGICAL HISTORY  09/2012    sinus surgery-Dr. Mickle Mallory    OTHER SURGICAL HISTORY  01/27/2013    Thymoma-Dr. Nancie Neas    TONSILLECTOMY  82 yrs old    UPPER GASTROINTESTINAL ENDOSCOPY  2015    Gastric AVM txed with cautery-Dr. Orpah Melter     Family History   Problem Relation Age of Onset    Osteoarthritis Mother     Breast Cancer Neg Hx     Alzheimer's Disease Father      Social History     Socioeconomic History    Marital status: Widowed     Spouse name: Not on file    Number of children: Not on file    Years of education: Not on file    Highest education level: Not on file   Occupational History    Not on file   Tobacco Use    Smoking status: Former     Current packs/day: 0.00     Average packs/day: 1 pack/day for 24.0 years (24.0 ttl pk-yrs)     Types: Cigarettes     Start date: 07/08/1959  Quit date: 07/08/1983     Years since  quitting: 39.2    Smokeless tobacco: Never    Tobacco comments:     Quit smoking: not regular when first started- 1pk/week   Substance and Sexual Activity    Alcohol use: No     Alcohol/week: 0.0 standard drinks of alcohol    Drug use: No    Sexual activity: Not Currently     Partners: Male   Other Topics Concern    Not on file   Social History Narrative    Widowed since 06/29/21. 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     Social Determinants of Health     Financial Resource Strain: Low Risk  (10/15/2021)    Overall Financial Resource Strain (CARDIA)     Difficulty of Paying Living Expenses: Not hard at all   Food Insecurity: Not on file (10/15/2021)   Transportation Needs: Unknown (10/15/2021)    PRAPARE - Armed forces logistics/support/administrative officer (Medical): Not on file     Lack of Transportation (Non-Medical): No   Physical Activity: Not on file   Stress: Not on file   Social Connections: Not on file   Intimate Partner Violence: Not on file   Housing Stability: Unknown (10/15/2021)    Housing Stability Vital Sign     Unable to Pay for Housing in the Last Year: Not on file     Number of Places Lived in the Last Year: Not on file     Unstable Housing in the Last Year: No     Current Outpatient Medications   Medication Sig Dispense Refill    atorvastatin (LIPITOR) 20 MG tablet Take 1 tablet by mouth daily 90 tablet 2    albuterol sulfate HFA (PROVENTIL;VENTOLIN;PROAIR) 108 (90 Base) MCG/ACT inhaler Inhale 1 puff into the lungs every 4 hours as needed for Shortness of Breath or Wheezing 18 g 11    levothyroxine (SYNTHROID) 125 MCG tablet Take 1 tablet by mouth Daily (Patient not taking: Reported on 09/09/2022) 90 tablet 2    losartan (COZAAR) 100 MG tablet Take 1 tablet by mouth daily TAKE 1 TABLET BY MOUTH DAILY (Patient not taking: Reported on 09/09/2022) 90 tablet 2    metoprolol succinate (TOPROL XL) 50 MG extended release tablet Take 1 tablet by mouth daily (Patient not taking: Reported on 09/09/2022) 90 tablet 2     Cholecalciferol 100 MCG (4000 UT) TABS Take 1 tablet by mouth daily (Patient not taking: Reported on 07/08/2022)      fluticasone (FLONASE) 50 MCG/ACT nasal spray 2 sprays by Nasal route daily PRN (Patient not taking: Reported on 07/08/2022)       No current facility-administered medications for this visit.       OBJECTIVE:  BP 133/83   Pulse 62   Temp 98 F (36.7 C)   Resp 12   Ht 1.702 m ('5\' 7"'$ )   Wt 64.8 kg (142 lb 12.8 oz)   SpO2 96%   BMI 22.37 kg/m     Physical Exam:  Constitutional: Well developed, well nourished female in no acute distress, sitting comfortably in the exam room chair.    HEENT: Normocephalic and atraumatic. Sclerae anicteric. Neck supple without JVD. No thyromegaly present.    Lymph node   No palpable submandibular, cervical, supraclavicular lymph nodes.   Skin Warm and dry.  No bruising and no rash noted.  No erythema.  No pallor.    Respiratory Lungs are clear to auscultation  bilaterally without wheezes, rales or rhonchi, normal air exchange without accessory muscle use.    CVS Normal rate, regular rhythm and normal S1 and S2.  No murmurs, gallops, or rubs.   Abdomen Soft, nontender and nondistended, normoactive bowel sounds.  No palpable mass.  No hepatosplenomegaly.   Neuro Grossly nonfocal with no obvious sensory or motor deficits.   MSK Normal range of motion in general.  No edema and no tenderness.   Psych Appropriate mood and affect.      Labs:  Recent Results (from the past 96 hour(s))   Comprehensive Metabolic Panel    Collection Time: 09/09/22 10:00 AM   Result Value Ref Range    Sodium 138 136 - 146 mmol/L    Potassium 3.9 3.5 - 5.1 mmol/L    Chloride 105 103 - 113 mmol/L    CO2 28 21 - 32 mmol/L    Anion Gap 5 2 - 11 mmol/L    Glucose 110 (H) 65 - 100 mg/dL    BUN 20 8 - 23 MG/DL    Creatinine 1.60 (H) 0.6 - 1.0 MG/DL    Est, Glom Filt Rate 32 (L) >60 ml/min/1.68m    Calcium 8.9 8.3 - 10.4 MG/DL    Total Bilirubin 1.3 (H) 0.2 - 1.1 MG/DL    ALT 15 12 - 65 U/L    AST 18 15 -  37 U/L    Alk Phosphatase 57 50 - 136 U/L    Total Protein 7.6 6.3 - 8.2 g/dL    Albumin 3.7 3.2 - 4.6 g/dL    Globulin 3.9 2.8 - 4.5 g/dL    Albumin/Globulin Ratio 0.9 0.4 - 1.6     CBC with Auto Differential    Collection Time: 09/09/22 10:00 AM   Result Value Ref Range    WBC 6.7 4.3 - 11.1 K/uL    RBC 4.59 4.05 - 5.2 M/uL    Hemoglobin 14.0 11.7 - 15.4 g/dL    Hematocrit 44.4 35.8 - 46.3 %    MCV 96.7 82.0 - 102.0 FL    MCH 30.5 26.1 - 32.9 PG    MCHC 31.5 31.4 - 35.0 g/dL    RDW 13.7 11.9 - 14.6 %    Platelets 210 150 - 450 K/uL    MPV 10.2 9.4 - 12.3 FL    nRBC 0.00 0.0 - 0.2 K/uL    Neutrophils % 51 43 - 78 %    Lymphocytes % 38 13 - 44 %    Monocytes % 8 4.0 - 12.0 %    Eosinophils % 2 0.5 - 7.8 %    Basophils % 1 0.0 - 2.0 %    Immature Granulocytes 0 0.0 - 5.0 %    Neutrophils Absolute 3.4 1.7 - 8.2 K/UL    Lymphocytes Absolute 2.6 0.5 - 4.6 K/UL    Monocytes Absolute 0.5 0.1 - 1.3 K/UL    Eosinophils Absolute 0.1 0.0 - 0.8 K/UL    Basophils Absolute 0.1 0.0 - 0.2 K/UL    Absolute Immature Granulocyte 0.0 0.0 - 0.5 K/UL    Differential Type AUTOMATED         Imaging:  CT CHEST ABDOMEN PELVIS W CONTRAST Additional Contrast? None    Result Date: 09/03/2022  CT of the Chest, Abdomen, and Pelvis INDICATION: Right lung adenocarcinoma. Restaging. TECHNIQUE: Multiple 2D axial images were obtained through the chest, abdomen, and pelvis.  Oral contrast was used for bowel opacification.  1065mof Isovue 370  intravenous contrast was used for better evaluation of solid organs and vascular structures.  Radiation dose reduction techniques were used for this study.  All CT scans performed at this facility use one or all of the following: Automated exposure control, adjustment of the mA and/or kVp according to patient's size, iterative reconstruction. COMPARISON: CT chest abdomen pelvis September 2023. CT chest abdomen pelvis August 2022. FINDINGS: - LUNGS: Prior right lower lobectomy. No evidence of recurrent or pulmonary  metastatic disease. No pneumonia. No pleural effusion. - MEDIASTINUM/AXILLA: No significant adenopathy. - HEART/VESSELS: Mild cardiomegaly. Small pericardial effusion is chronic. Coronary artery disease - CHEST WALL: No thoracic osseous metastatic disease. Degenerative disc disease of the spine. No vertebral compression fracture. - LIVER: Normal. - GALLBLADDER/BILE DUCTS: Normal gallbladder and bile ducts. - PANCREAS: Normal. - SPLEEN: Normal. - ADRENALS:  Normal. - KIDNEYS/URETERS: No hydronephrosis or significant mass. - BLADDER: Decompressed urinary bladder. - REPRODUCTIVE ORGANS: Prominent parauterine veins. No uterine mass. No adnexal mass. - BOWEL: Colonic diverticulosis without diverticulitis. Normal appendix. - LYMPH NODES: Diffuse degenerative changes in the spine. No vertebral fracture. No osseous metastatic disease. - BONES: No fracture or significant bone lesion. - VASCULATURE: Severe plaque of the abdominal aorta with 3 cm infrarenal aneurysm, previously 2.9 cm in September 2023 using same measurement technique. It measured 2.7 cm in August 2022 - OTHER: No ascites.     No recurrent or metastatic disease in the chest, abdomen, or pelvis. Infrarenal abdominal aortic aneurysm, 3 cm, slowly increasing.           CT CHEST ABDOMEN PELVIS W CONTRAST Additional Contrast? None    Result Date: 08/29/2021  CT CHEST ABDOMEN PELVIS W CONTRAST 08/29/2021 10:26 AM HISTORY: Follow-up adenocarcinoma of the right lung. Palpable abnormality right axilla and right abdomen. COMPARISON: CT chest abdomen pelvis 02/08/2021. TECHNIQUE: Multiple axial images were obtained through the chest, abdomen, and pelvis.  Oral contrast was used for bowel opacification.  100 mL of Isovue 370 intravenous contrast was used for better evaluation of solid organs and vascular structures.  Radiation dose reduction techniques were used for this study.  All CT scans performed at this facility use one or all of the following: Automated exposure  control, adjustment of the mA and/or kVp according to patient's size, iterative reconstruction. FINDINGS: LUNGS AND PLEURA: Centrilobular emphysema. Volume loss right hemithorax consistent with partial right lung resection. No evidence of recurrent mass. No pleural effusions. MEDIASTINUM/AXILLAE: Heart is enlarged. No pericardial effusion. Esophagus is unremarkable. Thyroid gland not visualized. No enlarged lymph nodes. Scattered coronary artery calcifications are present. HEPATOBILIARY: Normal. PANCREAS: Normal. SPLEEN: Normal. ADRENAL GLANDS: Normal. KIDNEYS/BLADDER: Kidneys and bladder are unremarkable. No hydronephrosis or renal calculi. BOWEL: No bowel obstruction. Appendix is normal. LYMPH NODES: No enlarged lymph nodes. VASCULATURE: Calcified and noncalcified plaque abdominal aorta without significant aneurysmal dilatation or dissection. PELVIC ORGANS: Uterus and rectum are unremarkable. No free pelvic fluid. No pelvic mass. MUSCULOSKELETAL: Degenerative spine changes. No destructive bone lesions are appreciated. No abnormality noted in the area of patient's palpable lesions right axilla on image 25 of series 2 and in the anterior right abdominal wall on image 73.     1. No evidence of recurrent or metastatic disease in the chest, abdomen or pelvis. Changes of partial right lung resection. 2. No CT findings in the area of patient's palpable abnormalities right axilla and right anterior lateral abdominal wall.           CT CHEST ABDOMEN PELVIS W CONTRAST    Result  Date: 02/08/2021  EXAM: CT CHEST, ABDOMEN, AND PELVIS WITH CONTRAST INDICATION: Lung cancer. COMPARISON: PET/CT 11/08/2020, chest CT 10/22/2020 TECHNIQUE:  CT imaging was performed of the chest, abdomen, and pelvis after intravenous injection of 100 mL Isovue 370. Intravenous contrast was used for better evaluation of solid organs and vascular structures. Oral contrast was used for bowel opacification. Coronal reformatted imaging provided. Radiation dose  reduction techniques were used for this study. Our CT scanners use one or all of the following: Automated exposure control, adjustment of the mA and/or kV according to patient size, iterative reconstruction. FINDINGS: CHEST: Mediastinum and visualized thyroid: No lymphadenopathy. Heart: Multivessel coronary atherosclerotic calcifications. Large Vessels: Normal. Pleura: Normal. Lungs: Post surgical changes in the right lung. No recurrent tumor evident. Normal right lung hilum. Airways: Normal. ABDOMEN AND PELVIS: Motion limited evaluation of the abdomen Liver: Normal in size and morphology.  No focal lesions. Gallbladder/biliary: Normal gallbladder.  No biliary dilatation. Pancreas: Normal. Spleen: Normal. Adrenals: Stable appearance of the left adrenal gland.. Kidneys: No focal lesion or hydronephrosis. Bladder: Normal. Pelvic organs: Normal uterus.  No adnexal mass. Gastrointestinal: Colonic diverticulosis.. Peritoneum/retroperitoneum: Normal. Lymph nodes: Normal. Vessels: Aortic atherosclerotic disease. Bones/Soft tissues: No aggressive appearing bone lesion.     Post surgical changes in the right lung without evidence of locally recurrent tumor or metastatic disease in the chest, abdomen, or pelvis.        ASSESSMENT:   Diagnosis Orders   1. Malignant neoplasm of unspecified part of right bronchus or lung ([place])  CT CHEST ABDOMEN PELVIS W CONTRAST Additional Contrast? Radiologist Recommendation      2. Adenocarcinoma of right lung, stage 2 ([facility])  CBC with Auto Differential    Comprehensive Metabolic Panel    CT CHEST ABDOMEN PELVIS W CONTRAST Additional Contrast? Radiologist Recommendation      3. Large cell carcinoma of lung, right ([place])  CT CHEST ABDOMEN PELVIS W CONTRAST Additional Contrast? Radiologist Recommendation                    PLAN:  Lab studies were personally reviewed.    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  PET/CT  shows no distant disease.  Clinically T3N0M0.  She completed VATS with left lower lobectomy as well as chest wall mass resection  and lymphadenectomy on 05/07/20.  She tolerated this reasonably well, still with some post-op tenderness but nothing unmanageable.  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  13 lymph node fragments were all negative for tumor.  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  We do also have the ALCHEMIST trial available, but unfortunately she is not a candidate due to her history of RA with MTX use.  She completed 4 cycles of cisplatin and Alimta with adequate tolerance.  CT chest at the conclusion of therapy reviewed and shows nothing of suspicion in the lung.  However, she does have a 1.4 cm adrenal nodule which was not obviously present on pretherapy imaging.  We recommended PET/CT to assess but this shows no significant uptake in the left adrenal or other areas of concern.  We will continue observation with repeat CT  in 3 months.     Here for follow-up, accompanied by her granddaughter.  No major changes, she is doing well overall.  ECOG 0.  No new symptoms of concerns.   Labs reviewed and unremarkable, mild CKD is unchanged.  CT reviewed February 2024 and shows no evidence of recurrent disease.  She can now change to annual follow-up.  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 Kate Sable.  We can include A/P in the annual imaging for her AAA surveillance.  She is comfortable with the plan.  All questions were asked and answered to the best of my ability.                Greer Ee, MD  [facility] [place] [place] [address]  Office : [phone]  Fax : [phone]

## 2022-09-16 ENCOUNTER — Encounter: Payer: MEDICARE | Attending: Orthopaedic Surgery | Primary: Internal Medicine

## 2022-09-16 NOTE — Telephone Encounter (Signed)
From: Audrey Snow  To: Dr. Isac Caddy  Sent: 09/15/2022 4:03 PM EDT  Subject: Appointment Request    Appointment Request From: Audrey Snow    With Provider: Alyson Ingles, MD [Ashton Botines    Preferred Date Range: 09/18/2022 - 09/25/2022    Preferred Times: Any Time    Reason for visit: Request an Appointment    Comments:  can also do 3/21 -3/29. Best time is around 1030 ish - checkup resched

## 2022-10-21 ENCOUNTER — Ambulatory Visit
Admit: 2022-10-21 | Discharge: 2022-10-21 | Payer: MEDICARE | Attending: Orthopaedic Surgery | Primary: Internal Medicine

## 2022-10-21 DIAGNOSIS — M25511 Pain in right shoulder: Secondary | ICD-10-CM

## 2022-10-21 MED ORDER — METHYLPREDNISOLONE ACETATE 40 MG/ML IJ SUSP
40 | Freq: Once | INTRAMUSCULAR | Status: AC
Start: 2022-10-21 — End: 2022-10-21
  Administered 2022-10-21: 16:00:00 80 mg via INTRA_ARTICULAR

## 2022-10-21 NOTE — Progress Notes (Signed)
Name: Audrey Snow  Date of Birth: 07/19/1940  Gender: female  MRN: [id number]    CC:   Chief Complaint   Patient presents with    Follow-up     Right shoulder        HPI: Patient presents for follow-up of right shoulder pain.  Patient has had right shoulder injection on 08-05-22.  Patient presents today with her daughter.  She has complaints of pain but also a knot in the anterior shoulder.  She has significant memory problems.  No new injuries comparison to previous visit.    Allergies   Allergen Reactions    Yeast Nausea And Vomiting     Pt. Says she hasnt had issues with this in a long time    Yeast-Related Products Nausea And Vomiting and Headaches     Pt STATED NOT ALLERGIC     Past Medical History:   Diagnosis Date    Arrhythmia 04/05/2020    PVCs.  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    Chronic obstructive pulmonary disease (HCC)     uses inhalers    Chronic pain of left knee 06/25/2018    Coronary atherosclerosis     noted on chest CT    COVID-19 vaccine series completed 08/15/2019    Pfizer; 07/29/2019 and booster given 03/12/2020    GERD (gastroesophageal reflux disease)     no meds at this time    H/O cardiovascular stress test     Bruce protocol, low risk study, EF of 67% and normal LV function    H/O mammogram 07/28/2016    mammogram and Ultrasound of right breaset- benign finding- [facility]     Hearing loss     "only hears out of left ear"    Hearing loss of both ears     no hearing aids     HTN (hypertension), benign 01/30/2014    controlled w/med    Hypothyroid     on med for control    Menopause     Osteoarthritis     Osteoporosis     prolia     Poor historian     patient had trouble remembering names of meds and what they were for and had to  repeat instructions for surgery several times     Rheumatoid arthritis(714.0) 01/30/2014    followed by Dr. Odis Luster. 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 Carmela Hurt    Unspecified hypothyroidism 01/30/2014    thyroid medication    Vitamin D deficiency     medications for this      Past Surgical History:   Procedure Laterality Date    CARPAL TUNNEL RELEASE Bilateral     COLONOSCOPY      colon polyps-adenomatous, has seen Dr. Leron Croak in past    COLONOSCOPY N/A 01/27/2018    COLONOSCOPY/BMI 26 performed by Adalberto Ill, MD at [facility] ENDOSCOPY    COLONOSCOPY FLX DX W/COLLJ [facility] WHEN [facility]  01/27/2018         CT NEEDLE BIOPSY LUNG PERCUTANEOUS  04/16/2020    CT NEEDLE BIOPSY LUNG PERCUTANEOUS  04/16/2020 [facility] RAD[facility]OLOGY CT SCAN    HEMORRHOID SURGERY      OTHER SURGICAL HISTORY  09/2012    sinus surgery-Dr. Boris Lown    OTHER SURGICAL HISTORY  01/27/2013    Thymoma-Dr. Carmela Hurt    TONSILLECTOMY  82 yrs old    UPPER GASTROINTESTINAL ENDOSCOPY  2015    Gastric AVM txed with cautery-Dr. Leilani Able     Family History   Problem Relation Age of Onset    Osteoarthritis Mother     Breast Cancer Neg Hx     Alzheimer's Disease Father      Social History     Socioeconomic History    Marital status: Widowed     Spouse name: Not on file    Number of children: Not on file    Years of education: Not on file    Highest education level: Not on file   Occupational History    Not on file   Tobacco Use    Smoking status: Former     Current packs/day: 0.00     Average packs/day: 1 pack/day for 24.0 years (24.0 ttl pk-yrs)     Types: Cigarettes     Start date: 07/08/1959     Quit date: 07/08/1983     Years since quitting: 39.3    Smokeless tobacco: Never    Tobacco comments:     Quit smoking: not regular when first started- 1pk/week   Substance and Sexual Activity    Alcohol use: No     Alcohol/week: 0.0 standard drinks of alcohol    Drug use: No    Sexual activity: Not Currently     Partners: Male   Other  Topics Concern    Not on file   Social History Narrative    Widowed since 06/29/21. Works part time at [employer] in [employer].     Social Determinants of Health     Financial Resource Strain: Low Risk  (10/15/2021)    Overall Financial Resource Strain (CARDIA)     Difficulty of Paying Living Expenses: Not hard at all   Food Insecurity: Not on file (10/15/2021)   Transportation Needs: Unknown (10/15/2021)    PRAPARE - Therapist, art (Medical): Not on file     Lack of Transportation (Non-Medical): No   Physical Activity: Not on file   Stress: Not on file   Social Connections: Not on file   Intimate Partner Violence: Not on file   Housing Stability: Unknown (10/15/2021)    Housing Stability Vital Sign     Unable to Pay for Housing in the Last Year: Not on file     Number of Places Lived in the Last Year: Not on file     Unstable Housing in the Last Year: No               No data to display                Review of Systems  Non-contributory    PE:    No major changes in comparison to previous visit.  She does have a small 3 or 4 cm nontender mobile lipoma.    Xray: Not indicated    A/Plan:     ICD-10-CM    1. Right shoulder pain, unspecified chronicity  M25.511 methylPREDNISolone acetate (DEPO-MEDROL) injection 80 mg     DRAIN/INJECT LARGE JOINT/BURSA      2. Arthritis of right shoulder region  M19.011 methylPREDNISolone acetate (DEPO-MEDROL) injection  80 mg     DRAIN/INJECT LARGE JOINT/BURSA      3. Lipoma of right upper extremity  D17.21            Procedure note: After discussion of risks and benefits including, but not limited to pain, infection, steroid flare, increased blood sugar, fat necrosis, skin discoloration, and injury to blood vessels or nerves, the patient verbally consented to proceed with a glenohumeral joint injection.  They understand that we are using this is an alternative method of treatment and the decision to not proceed with elective major surgery.  We have discussed this decision  in detail.  The affected right shoulder was sterilely prepped in standard fashion and injected with 2 cc of depo medrol (40mg /ml), 2 cc of 1% Lidocaine, and 2 cc of 0.5% Marcaine into the joint space.  The patient tolerated the injection well.    Return in about 3 months (around 01/20/2023) for recheck on Chay's schedule ONLY.        Herschel Senegal, MD  10/21/22

## 2022-12-16 ENCOUNTER — Encounter

## 2022-12-16 ENCOUNTER — Encounter: Admit: 2022-12-16 | Discharge: 2022-12-16 | Payer: MEDICARE | Attending: Internal Medicine | Primary: Internal Medicine

## 2022-12-16 DIAGNOSIS — M0579 Rheumatoid arthritis with rheumatoid factor of multiple sites without organ or systems involvement: Secondary | ICD-10-CM

## 2022-12-16 NOTE — Progress Notes (Signed)
HPI: Audrey Snow (DOB: [birth year])    Still with SOB   Has not been using her inhaler- forgets it    Also has lost wt and states she does not have an appetite==also forgets to eat    She has swelling of her 4th finger right hand-denies trauma    Has been seeing Ortho and the injections in her shoulder on right has helped with pain    Problem List:  Patient Active Problem List   Diagnosis   . Rheumatoid arthritis involving multiple sites with positive rheumatoid factor (HCC)   . Dry skin dermatitis   . Hyperlipidemia   . Chronic right shoulder pain   . Pre-ulcerative corn or callous   . Mass of lower lobe of right lung   . Vitamin D deficiency   . Environmental allergies   . HTN (hypertension)   . Bilateral leg cramps   . Gastroesophageal reflux disease without esophagitis   . Large cell carcinoma of lung, right (HCC)   . Adenocarcinoma of right lung, stage 2 (HCC)   . Bronchogenic cancer of right lung (HCC)   . Chronic pain of left knee   . Elevated blood sugar   . Chronic obstructive pulmonary disease (HCC)   . Hypothyroidism   . Stage 3b chronic kidney disease (HCC)   . Short-term memory loss   . Chronic otitis externa of right ear   . 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 Elevated C-reactive protein (CRP)   . Postmenopausal osteoporosis   . Arthritis of right shoulder region       History:  Past Medical History:   Diagnosis Date   . Arrhythmia 04/05/2020    PVCs.  04/05/20 was referred to [facility] for tachycardia, was seen 05/01/20 [facility]   . 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 Autoimmune disease (HCC)     RA   . 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 Chronic obstructive pulmonary disease (HCC)     uses inhalers   . Chronic pain of left knee 06/25/2018   . Coronary atherosclerosis     noted on chest CT   . COVID-19 vaccine series completed 08/15/2019     Pfizer; 07/29/2019 and booster given 03/12/2020   . GERD (gastroesophageal reflux disease)     no meds at this time   . H/O cardiovascular stress test     Bruce protocol, low risk study, EF of 67% and normal LV function   . H/O mammogram 07/28/2016    mammogram and Ultrasound of right breaset- benign finding- [facility]    . Hearing loss     "only hears out of left ear"   . Hearing loss of both ears     no hearing aids    . HTN (hypertension), benign 01/30/2014    controlled w/med   . Hypothyroid     on med for control   . Menopause    . Osteoarthritis    . Osteoporosis     prolia    . Poor historian     patient had trouble remembering names of meds and what they were for and had to repeat instructions for surgery several times    . 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 Odis Luster. Methotrexate Rx   . Sinusitis     sinus flonase    .  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 Carmela Hurt   . Unspecified hypothyroidism 01/30/2014    thyroid medication   . Vitamin D deficiency     medications for this        Allergies:  Allergies   Allergen Reactions   . Yeast Nausea And Vomiting     Pt. Says she hasnt had issues with this in a long time   . Yeast-Related Products Nausea And Vomiting and Headaches     Pt STATED NOT ALLERGIC       Current Medications:  Current Outpatient Medications   Medication Sig Dispense Refill   . levothyroxine (SYNTHROID) 125 MCG tablet Take 1 tablet by mouth Daily 90 tablet 2   . atorvastatin (LIPITOR) 20 MG tablet Take 1 tablet by mouth daily 90 tablet 2   . losartan (COZAAR) 100 MG tablet Take 1 tablet by mouth daily TAKE 1 TABLET BY MOUTH DAILY 90 tablet 2   . metoprolol succinate (TOPROL XL) 50 MG extended release tablet Take 1 tablet by mouth daily 90 tablet 2   . albuterol sulfate HFA (PROVENTIL;VENTOLIN;PROAIR) 108 (90 Base) MCG/ACT inhaler Inhale 1 puff into the lungs every 4 hours as needed for Shortness of Breath or Wheezing 18 g 11   . Cholecalciferol 100 MCG (4000 UT) TABS Take  1 tablet by mouth daily     . fluticasone (FLONASE) 50 MCG/ACT nasal spray 2 sprays by Nasal route daily PRN       No current facility-administered medications for this visit.       Review of Systems:  Review of Systems   Constitutional:  Positive for unexpected weight change.        Wt loss   HENT:  Positive for hearing loss.    Respiratory:  Positive for shortness of breath.    Musculoskeletal:  Positive for arthralgias.   All other systems reviewed and are negative.    Vitals:  Pulse 80   Ht 1.702 m (5\' 7" )   Wt 64.2 kg (141 lb 9.6 oz)   SpO2 97%   BMI 22.18 kg/m     Physical Exam:  Physical Exam  Vitals reviewed.   Constitutional:       Appearance: Normal appearance. She is normal weight.      Comments: Very HOH   HENT:      Head: Normocephalic and atraumatic.   Eyes:      Extraocular Movements: Extraocular movements intact.      Pupils: Pupils are equal, round, and reactive to light.   Cardiovascular:      Rate and Rhythm: Normal rate and regular rhythm.      Heart sounds: Normal heart sounds.   Pulmonary:      Effort: Pulmonary effort is normal.      Breath sounds: Normal breath sounds. No wheezing.   Musculoskeletal:         General: Swelling and deformity present. Normal range of motion.      Cervical back: Normal range of motion and neck supple.      Comments: 4th finger right hand mid joint with swelling   Skin:     General: Skin is warm and dry.   Neurological:      General: No focal deficit present.      Mental Status: She is alert and oriented to person, place, and time.   Psychiatric:         Mood and Affect: Mood normal.  Behavior: Behavior normal.         Thought Content: Thought content normal.         Judgment: Judgment normal.        Assessment/Plan:   Audrey Snow was seen today for follow-up.    Diagnoses and all orders for this visit:    Rheumatoid arthritis with rheumatoid factor of multiple sites without organ or systems involvement (HCC)    Stage 3b chronic kidney disease  (HCC)    Primary hypertension    Acquired hypothyroidism    Mixed hyperlipidemia      Continue to see Ortho for shoulder injection    Recommend topical Voltaren gel prn for finger swelling    Will update labs    Continue to monitor blood sugar    Update thyroid level    BP is controlled    For RA has been seeing Rheum      Current medications are therapeutic at this time; continue as prescribed.        Rufina Falco, MD

## 2022-12-17 LAB — COMPREHENSIVE METABOLIC PANEL
ALT: 13 U/L (ref 12–65)
AST: 25 U/L (ref 15–37)
Albumin/Globulin Ratio: 1.3 (ref 1.0–1.9)
Albumin: 3.9 g/dL (ref 3.2–4.6)
Alk Phosphatase: 52 U/L (ref 35–104)
Anion Gap: 11 mmol/L (ref 9–18)
BUN: 18 MG/DL (ref 8–23)
CO2: 26 mmol/L (ref 20–28)
Calcium: 9.3 MG/DL (ref 8.8–10.2)
Chloride: 104 mmol/L (ref 98–107)
Creatinine: 1.35 MG/DL — ABNORMAL HIGH (ref 0.60–1.10)
Est, Glom Filt Rate: 39 mL/min/{1.73_m2} — ABNORMAL LOW (ref >60)
Globulin: 3.1 g/dL (ref 2.3–3.5)
Glucose: 94 mg/dL (ref 70–99)
Potassium: 4.1 mmol/L (ref 3.5–5.1)
Sodium: 141 mmol/L (ref 136–145)
Total Bilirubin: 1 MG/DL (ref 0.0–1.2)
Total Protein: 7 g/dL (ref 6.3–8.2)

## 2022-12-17 LAB — CBC WITH AUTO DIFFERENTIAL
Basophils %: 1 % (ref 0.0–2.0)
Basophils Absolute: 0 10*3/uL (ref 0.0–0.2)
Eosinophils %: 2 % (ref 0.5–7.8)
Eosinophils Absolute: 0.2 10*3/uL (ref 0.0–0.8)
Hematocrit: 45.2 % (ref 35.8–46.3)
Hemoglobin: 14.4 g/dL (ref 11.7–15.4)
Immature Granulocytes %: 0 % (ref 0.0–5.0)
Immature Granulocytes Absolute: 0 10*3/uL (ref 0.0–0.5)
Lymphocytes %: 32 % (ref 13–44)
Lymphocytes Absolute: 2.2 10*3/uL (ref 0.5–4.6)
MCH: 31.5 PG (ref 26.1–32.9)
MCHC: 31.9 g/dL (ref 31.4–35.0)
MCV: 98.9 FL (ref 82–102)
MPV: 10.4 FL (ref 9.4–12.3)
Monocytes %: 9 % (ref 4.0–12.0)
Monocytes Absolute: 0.6 10*3/uL (ref 0.1–1.3)
Neutrophils %: 56 % (ref 43–78)
Neutrophils Absolute: 4 10*3/uL (ref 1.7–8.2)
Platelets: 216 10*3/uL (ref 150–450)
RBC: 4.57 M/uL (ref 4.05–5.2)
RDW: 12.7 % (ref 11.9–14.6)
WBC: 7.1 10*3/uL (ref 4.3–11.1)
nRBC: 0 10*3/uL (ref 0.0–0.2)

## 2022-12-17 LAB — TSH: TSH, 3rd Generation: 5.76 u[IU]/mL — ABNORMAL HIGH (ref 0.270–4.200)

## 2022-12-17 LAB — LIPID PANEL
Chol/HDL Ratio: 3.2 (ref 0.0–5.0)
Cholesterol, Total: 186 MG/DL (ref 0–200)
HDL: 59 MG/DL (ref 40–60)
LDL Cholesterol: 111 MG/DL — ABNORMAL HIGH (ref 0–100)
Triglycerides: 79 MG/DL (ref 0–150)
VLDL Cholesterol Calculated: 16 MG/DL (ref 6–23)

## 2022-12-17 LAB — HEMOGLOBIN A1C
Estimated Avg Glucose: 134 mg/dL
Hemoglobin A1C: 6.3 % — ABNORMAL HIGH (ref 0–5.6)

## 2023-01-22 ENCOUNTER — Encounter: Payer: MEDICARE | Attending: Physician Assistant | Primary: Internal Medicine

## 2023-01-22 ENCOUNTER — Ambulatory Visit
Admit: 2023-01-22 | Discharge: 2023-01-22 | Payer: MEDICARE | Attending: Physician Assistant | Primary: Internal Medicine

## 2023-01-22 DIAGNOSIS — M19011 Primary osteoarthritis, right shoulder: Secondary | ICD-10-CM

## 2023-01-22 MED ORDER — METHYLPREDNISOLONE ACETATE 40 MG/ML IJ SUSP
40 | Freq: Once | INTRAMUSCULAR | Status: AC
Start: 2023-01-22 — End: 2023-01-22
  Administered 2023-01-22: 18:00:00 80 mg via INTRA_ARTICULAR

## 2023-01-22 NOTE — Progress Notes (Signed)
Name: Audrey Snow  Date of Birth: Jul 01, 1941  Gender: female  MRN: [id number]    CC:   Chief Complaint   Patient presents with    Follow-up     Right shoulder CSI        HPI: Patient presents for follow-up of right shoulder pain.  Patient has had right shoulder injection on 10-21-22.  Patient presents today requesting repeat.  No new injuries comparison to previous visit.    Allergies   Allergen Reactions    Yeast Nausea And Vomiting     Pt. Says she hasnt had issues with this in a long time    Yeast-Related Products Nausea And Vomiting and Headaches     Pt STATED NOT ALLERGIC     Past Medical History:   Diagnosis Date    Arrhythmia 04/05/2020    PVCs.  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    Chronic obstructive pulmonary disease (HCC)     uses inhalers    Chronic pain of left knee 06/25/2018    Coronary atherosclerosis     noted on chest CT    COVID-19 vaccine series completed 08/15/2019    Pfizer; 07/29/2019 and booster given 03/12/2020    GERD (gastroesophageal reflux disease)     no meds at this time    H/O cardiovascular stress test     Bruce protocol, low risk study, EF of 67% and normal LV function    H/O mammogram 07/28/2016    mammogram and Ultrasound of right breaset- benign finding- [facility]     Hearing loss     "only hears out of left ear"    Hearing loss of both ears     no hearing aids     HTN (hypertension), benign 01/30/2014    controlled w/med    Hypothyroid     on med for control    Menopause     Osteoarthritis     Osteoporosis     prolia     Poor historian     patient had trouble remembering names of meds and what they were for and had to repeat instructions for surgery several times     Rheumatoid arthritis(714.0) 01/30/2014    followed by  Dr. Odis Luster. 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 Carmela Hurt    Unspecified hypothyroidism 01/30/2014    thyroid medication    Vitamin D deficiency     medications for this      Past Surgical History:   Procedure Laterality Date    CARPAL TUNNEL RELEASE Bilateral     COLONOSCOPY      colon polyps-adenomatous, has seen Dr. Leron Croak in past    COLONOSCOPY N/A 01/27/2018    COLONOSCOPY/BMI 26 performed by Adalberto Ill, MD at [facility] ENDOSCOPY    COLONOSCOPY FLX DX W/COLLJ [facility] WHEN PFRMD  01/27/2018         CT NEEDLE BIOPSY LUNG PERCUTANEOUS  04/16/2020    CT NEEDLE BIOPSY LUNG PERCUTANEOUS 04/16/2020 [facility] RADIOLOGY CT SCAN    HEMORRHOID SURGERY      OTHER SURGICAL HISTORY  09/2012  sinus surgery-Dr. Boris Lown    OTHER SURGICAL HISTORY  01/27/2013    Thymoma-Dr. Carmela Hurt    TONSILLECTOMY  82 yrs old    UPPER GASTROINTESTINAL ENDOSCOPY  2015    Gastric AVM txed with cautery-Dr. Leilani Able     Family History   Problem Relation Age of Onset    Osteoarthritis Mother     Breast Cancer Neg Hx     Alzheimer's Disease Father      Social History     Socioeconomic History    Marital status: Widowed     Spouse name: Not on file    Number of children: Not on file    Years of education: Not on file    Highest education level: Not on file   Occupational History    Not on file   Tobacco Use    Smoking status: Former     Current packs/day: 0.00     Average packs/day: 1 pack/day for 24.0 years (24.0 ttl pk-yrs)     Types: Cigarettes     Start date: 07/08/1959     Quit date: 07/08/1983     Years since quitting: 39.5    Smokeless tobacco: Never    Tobacco comments:     Quit smoking: not regular when first started- 1pk/week   Substance and Sexual Activity    Alcohol use: No     Alcohol/week: 0.0 standard drinks of alcohol    Drug use: No    Sexual activity: Not Currently     Partners: Male   Other Topics Concern    Not on file   Social History Narrative    Widowed since 06/29/21. Works part time at  [employer] in [employer].     Social Determinants of Health     Financial Resource Strain: Patient Declined (12/13/2022)    Overall Financial Resource Strain (CARDIA)     Difficulty of Paying Living Expenses: Patient declined   Food Insecurity: Patient Declined (12/13/2022)    Hunger Vital Sign     Worried About Running Out of Food in the Last Year: Patient declined     Ran Out of Food in the Last Year: Patient declined   Transportation Needs: Unknown (12/13/2022)    PRAPARE - Therapist, art (Medical): Not on file     Lack of Transportation (Non-Medical): No   Physical Activity: Not on file   Stress: Not on file   Social Connections: Not on file   Intimate Partner Violence: Not on file   Housing Stability: Unknown (12/13/2022)    Housing Stability Vital Sign     Unable to Pay for Housing in the Last Year: Not on file     Number of Places Lived in the Last Year: Not on file     Unstable Housing in the Last Year: No               No data to display                Review of Systems  Non-contributory    PE:    No major changes in comparison to previous visit    Xray: Not indicated    A/Plan:     ICD-10-CM    1. Arthritis of right shoulder region  M19.011 methylPREDNISolone acetate (DEPO-MEDROL) injection 80 mg     DRAIN/INJECT LARGE JOINT/BURSA      2. Lipoma of right upper extremity  D17.21 methylPREDNISolone acetate (DEPO-MEDROL) injection 80 mg  DRAIN/INJECT LARGE JOINT/BURSA      3. Right shoulder pain, unspecified chronicity  M25.511 methylPREDNISolone acetate (DEPO-MEDROL) injection 80 mg     DRAIN/INJECT LARGE JOINT/BURSA           Procedure note: After discussion of risks and benefits including, but not limited to pain, infection, steroid flare, increased blood sugar, fat necrosis, skin discoloration, and injury to blood vessels or nerves, the patient verbally consented to proceed with a glenohumeral joint injection.  They understand that we are using this is an alternative method of treatment and  the decision to not proceed with elective major surgery.  We have discussed this decision in detail.  The affected right shoulder was sterilely prepped in standard fashion and injected with 2 cc of depo medrol (40mg /ml), 2 cc of 1% Lidocaine, and 2 cc of 0.5% Marcaine into the joint space.  The patient tolerated the injection well.    Return in about 3 months (around 04/24/2023) for Ortho Injection.        [place], [place]  01/22/23

## 2023-03-12 LAB — CBC WITH AUTO DIFFERENTIAL
Basophils %: 1 % (ref 0.0–2.0)
Basophils Absolute: 0 10*3/uL (ref 0.0–0.2)
Eosinophils %: 1 % (ref 0.5–7.8)
Eosinophils Absolute: 0.1 10*3/uL (ref 0.0–0.8)
Hematocrit: 42.1 % (ref 35.8–46.3)
Hemoglobin: 13.8 g/dL (ref 11.7–15.4)
Immature Granulocytes %: 0 % (ref 0.0–5.0)
Immature Granulocytes Absolute: 0 10*3/uL (ref 0.0–0.5)
Lymphocytes %: 28 % (ref 13–44)
Lymphocytes Absolute: 2.5 10*3/uL (ref 0.5–4.6)
MCH: 31.7 pg (ref 26.1–32.9)
MCHC: 32.8 g/dL (ref 31.4–35.0)
MCV: 96.6 FL (ref 82–102)
MPV: 10.5 FL (ref 9.4–12.3)
Monocytes %: 8 % (ref 4.0–12.0)
Monocytes Absolute: 0.7 10*3/uL (ref 0.1–1.3)
Neutrophils %: 63 % (ref 43–78)
Neutrophils Absolute: 5.5 10*3/uL (ref 1.7–8.2)
Platelets: 251 10*3/uL (ref 150–450)
RBC: 4.36 M/uL (ref 4.05–5.2)
RDW: 14.3 % (ref 11.9–14.6)
WBC: 8.8 10*3/uL (ref 4.3–11.1)
nRBC: 0 10*3/uL (ref 0.0–0.2)

## 2023-03-12 LAB — ALT: ALT: 14 U/L (ref 12–65)

## 2023-03-12 LAB — AST: AST: 25 U/L (ref 15–37)

## 2023-03-12 LAB — CREATININE: Creatinine: 1.44 mg/dL — ABNORMAL HIGH (ref 0.60–1.10)

## 2023-03-14 LAB — C-REACTIVE PROTEIN: CRP: 3 mg/L (ref 0–10)

## 2023-04-24 ENCOUNTER — Encounter: Payer: MEDICARE | Attending: Physician Assistant | Primary: Internal Medicine

## 2023-06-15 MED ORDER — LOSARTAN POTASSIUM 100 MG PO TABS
100 | ORAL_TABLET | Freq: Every day | ORAL | 0 refills | Status: AC
Start: 2023-06-15 — End: ?

## 2023-06-15 MED ORDER — LEVOTHYROXINE SODIUM 125 MCG PO TABS
125 | ORAL_TABLET | Freq: Every day | ORAL | 0 refills | Status: AC
Start: 2023-06-15 — End: ?

## 2023-06-15 MED ORDER — ATORVASTATIN CALCIUM 20 MG PO TABS
20 | ORAL_TABLET | Freq: Every day | ORAL | 0 refills | Status: DC
Start: 2023-06-15 — End: 2023-07-15

## 2023-06-15 MED ORDER — METOPROLOL SUCCINATE ER 50 MG PO TB24
50 | ORAL_TABLET | Freq: Every day | ORAL | 0 refills | 60.00 days | Status: DC
Start: 2023-06-15 — End: 2023-12-09

## 2023-06-15 NOTE — Telephone Encounter (Signed)
 Requested Prescriptions     Pending Prescriptions Disp Refills    atorvastatin (LIPITOR) 20 MG tablet 90 tablet 0     Sig: Take 1 tablet by mouth daily    levothyroxine (SYNTHROID) 125 MCG tablet 90 tablet 0     Sig: Take 1 tablet by mouth Daily    losarta

## 2023-06-24 ENCOUNTER — Ambulatory Visit
Admit: 2023-06-24 | Discharge: 2023-06-24 | Payer: MEDICARE | Attending: Internal Medicine | Admitting: Internal Medicine | Primary: Internal Medicine

## 2023-06-24 ENCOUNTER — Encounter: Admit: 2023-06-24 | Admitting: Internal Medicine

## 2023-06-24 VITALS — BP 110/70 | Ht 67.0 in | Wt 136.0 lb

## 2023-06-24 DIAGNOSIS — M0579 Rheumatoid arthritis with rheumatoid factor of multiple sites without organ or systems involvement: Secondary | ICD-10-CM

## 2023-06-24 DIAGNOSIS — N1832 Chronic kidney disease, stage 3b (HCC): Secondary | ICD-10-CM

## 2023-06-24 NOTE — Progress Notes (Signed)
 HPI: Audrey Snow (DOB: January 02, 1941)    Needs labs updated    Needs flu vaccine    Still with pain in her shoulders and getting injections    Granddaughter is with her today       Problem List:  Patient Active Problem List   Diagnosis   . Rheumatoid art

## 2023-06-25 LAB — COMPREHENSIVE METABOLIC PANEL
ALT: 24 U/L (ref 8–45)
AST: 29 U/L (ref 15–37)
Albumin/Globulin Ratio: 1.2 (ref 1.0–1.9)
Albumin: 3.8 g/dL (ref 3.2–4.6)
Alk Phosphatase: 39 U/L (ref 35–104)
Anion Gap: 10 mmol/L (ref 7–16)
BUN: 22 mg/dL (ref 8–23)
CO2: 28 mmol/L (ref 20–29)
Calcium: 9.4 mg/dL (ref 8.8–10.2)
Chloride: 99 mmol/L (ref 98–107)
Creatinine: 1.64 mg/dL — ABNORMAL HIGH (ref 0.60–1.10)
Est, Glom Filt Rate: 31 mL/min/{1.73_m2} — ABNORMAL LOW (ref >60)
Globulin: 3.1 g/dL (ref 2.3–3.5)
Glucose: 102 mg/dL — ABNORMAL HIGH (ref 70–99)
Potassium: 4.3 mmol/L (ref 3.5–5.1)
Sodium: 137 mmol/L (ref 136–145)
Total Bilirubin: 1 mg/dL (ref 0.0–1.2)
Total Protein: 6.8 g/dL (ref 6.3–8.2)

## 2023-06-25 LAB — LIPID PANEL
Chol/HDL Ratio: 3.8 (ref 0.0–5.0)
Cholesterol, Total: 277 mg/dL — ABNORMAL HIGH (ref 0–200)
HDL: 74 mg/dL — ABNORMAL HIGH (ref 40–60)
LDL Cholesterol: 185 mg/dL — ABNORMAL HIGH (ref 0–100)
Triglycerides: 93 mg/dL (ref 0–150)
VLDL Cholesterol Calculated: 19 mg/dL (ref 6–23)

## 2023-06-25 LAB — CBC WITH AUTO DIFFERENTIAL
Basophils %: 1 % (ref 0.0–2.0)
Basophils Absolute: 0 10*3/uL (ref 0.0–0.2)
Eosinophils %: 2 % (ref 0.5–7.8)
Eosinophils Absolute: 0.1 10*3/uL (ref 0.0–0.8)
Hematocrit: 43.2 % (ref 35.8–46.3)
Hemoglobin: 14.1 g/dL (ref 11.7–15.4)
Immature Granulocytes %: 0 % (ref 0.0–5.0)
Immature Granulocytes Absolute: 0 10*3/uL (ref 0.0–0.5)
Lymphocytes %: 31 % (ref 13–44)
Lymphocytes Absolute: 2.1 10*3/uL (ref 0.5–4.6)
MCH: 32.7 pg (ref 26.1–32.9)
MCHC: 32.6 g/dL (ref 31.4–35.0)
MCV: 100.2 FL (ref 82–102)
MPV: 10.5 FL (ref 9.4–12.3)
Monocytes %: 9 % (ref 4.0–12.0)
Monocytes Absolute: 0.6 10*3/uL (ref 0.1–1.3)
Neutrophils %: 57 % (ref 43–78)
Neutrophils Absolute: 3.9 10*3/uL (ref 1.7–8.2)
Platelets: 207 10*3/uL (ref 150–450)
RBC: 4.31 M/uL (ref 4.05–5.2)
RDW: 14 % (ref 11.9–14.6)
WBC: 6.8 10*3/uL (ref 4.3–11.1)
nRBC: 0 10*3/uL (ref 0.0–0.2)

## 2023-06-25 LAB — TSH: TSH, 3rd Generation: 159 u[IU]/mL — ABNORMAL HIGH (ref 0.270–4.200)

## 2023-06-25 LAB — C-REACTIVE PROTEIN: CRP: <0.3 mg/dL (ref 0.0–0.4)

## 2023-06-25 LAB — HEMOGLOBIN A1C
Estimated Avg Glucose: 130 mg/dL
Hemoglobin A1C: 6.2 % — ABNORMAL HIGH (ref 0–5.6)

## 2023-06-25 NOTE — Telephone Encounter (Addendum)
 Pt would like a right shoulder injection  at  [facility]  around  11 am  please    From the 20th thru 1/3  or  1/13 or later

## 2023-07-09 ENCOUNTER — Ambulatory Visit
Admit: 2023-07-09 | Discharge: 2023-07-09 | Payer: MEDICARE | Attending: Physician Assistant | Admitting: Physician Assistant | Primary: Internal Medicine

## 2023-07-09 DIAGNOSIS — M19011 Primary osteoarthritis, right shoulder: Secondary | ICD-10-CM

## 2023-07-09 MED ORDER — METHYLPREDNISOLONE ACETATE 40 MG/ML IJ SUSP
40 | Freq: Once | INTRAMUSCULAR | Status: AC
Start: 2023-07-09 — End: 2023-07-09
  Administered 2023-07-09: 16:00:00 80 mg via INTRA_ARTICULAR

## 2023-07-10 NOTE — Progress Notes (Signed)
 Name: Audrey Snow  Date of Birth: [birth year]  Gender: female  MRN: [id number]    CC:   Chief Complaint   Patient presents with    Follow-up     Right shoulder CSI         HPI: They return today to have their right shoulder injection.    Allergies   Allergen Reactions    Yeast Nausea And Vomiting     Pt. Says she hasnt had issues with this in a long time    Yeast-Derived Drug Products Nausea And Vomiting and Headaches     Pt STATED NOT ALLERGIC     Past Medical History:   Diagnosis Date    Arrhythmia 04/05/2020    PVCs.  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    Chronic obstructive pulmonary disease (HCC)     uses inhalers    Chronic pain of left knee 06/25/2018    Coronary atherosclerosis     noted on chest CT    COVID-19 vaccine series completed 08/15/2019    Pfizer; 07/29/2019 and booster given 03/12/2020    GERD (gastroesophageal reflux disease)     no meds at this time    H/O cardiovascular stress test     Bruce protocol, low risk study, EF of 67% and normal LV function    H/O mammogram 07/28/2016    mammogram and Ultrasound of right breaset- benign finding- [facility]     Hearing loss     only hears out of left ear    Hearing loss of both ears     no hearing aids     HTN (hypertension), benign 01/30/2014    controlled w/med    Hypothyroid     on med for control    Menopause     Osteoarthritis     Osteoporosis     prolia     Poor historian     patient had trouble remembering names of meds and what they were for and had to repeat instructions for surgery several times     Rheumatoid arthritis(714.0) 01/30/2014    followed by Dr. Leonia. 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 Mateo in past    COLONOSCOPY N/A 01/27/2018    COLONOSCOPY/BMI 26 performed by Bertrum Toribio HERO, MD at [facility] ENDOSCOPY    COLONOSCOPY FLX DX W/COLLJ SPEC WHEN PFRMD  01/27/2018         CT NEEDLE BIOPSY LUNG PERCUTANEOUS W IMAGING GUIDANCE  04/16/2020    CT NEEDLE BIOPSY LUNG PERCUTANEOUS 04/16/2020 [facility] RAD[facility]OLOGY CT SCAN    HEMORRHOID SURGERY      OTHER SURGICAL HISTORY  09/2012    sinus surgery-Dr. Kennedy    OTHER SURGICAL HISTORY  01/27/2013  Thymoma-Dr. Osa    TONSILLECTOMY  83 yrs old    UPPER GASTROINTESTINAL ENDOSCOPY  2015    Gastric AVM txed with cautery-Dr. Delmus     Family History   Problem Relation Age of Onset    Osteoarthritis Mother     Breast Cancer Neg Hx     Alzheimer's Disease Father      Social History     Socioeconomic History    Marital status: Widowed     Spouse name: Not on file    Number of children: Not on file    Years of education: Not on file    Highest education level: Not on file   Occupational History    Not on file   Tobacco Use    Smoking status: Former     Current packs/day: 0.00     Average packs/day: 1 pack/day for 24.0 years (24.0 ttl pk-yrs)     Types: Cigarettes     Start date: 07/08/1959     Quit date: 07/08/1983     Years since quitting: 40.0    Smokeless tobacco: Never    Tobacco comments:     Quit smoking: not regular when first started- 1pk/week   Substance and Sexual Activity    Alcohol use: No     Alcohol/week: 0.0 standard drinks of alcohol    Drug use: No    Sexual activity: Not Currently     Partners: Male   Other Topics Concern    Not on file   Social History Narrative    Widowed since 06/29/21. 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     Social Determinants of Health     Financial Resource Strain: Patient Declined (12/13/2022)     Overall Financial Resource Strain (CARDIA)     Difficulty of Paying Living Expenses: Patient declined   Food Insecurity: Patient Declined (12/13/2022)    Hunger Vital Sign     Worried About Running Out of Food in the Last Year: Patient declined     Ran Out of Food in the Last Year: Patient declined   Transportation Needs: Unknown (12/13/2022)    PRAPARE - Therapist, Art (Medical): Not on file     Lack of Transportation (Non-Medical): No   Physical Activity: Not on file (09/23/2019)   Stress: Not on file   Social Connections: Unknown (09/23/2019)    Received from [facility], [facility]    Social Connections     Frequency of Communication with Friends and Family: Not asked     Frequency of Social Gatherings with Friends and Family: Not asked   Intimate Partner Violence: Unknown (09/23/2019)    Received from [facility], [facility]    Intimate Partner Violence     Fear of Current or Ex-Partner: Not asked     Emotionally Abused: Not asked     Physically Abused: Not asked     Sexually Abused: Not asked   Housing Stability: Unknown (12/13/2022)    Housing Stability Vital Sign     Unable to Pay for Housing in the Last Year: Not on file     Number of Places Lived in the Last Year: Not on file     Unstable Housing in the Last Year: No               No data to display                Review of Systems  Non-contributory    PE:    No major change in exam of shoulders.     Xray:    A/Plan:     ICD-10-CM    1. Arthritis of right shoulder region  M19.011 methylPREDNISolone  acetate (DEPO-MEDROL ) injection 80 mg     DRAIN/INJECT LARGE JOINT/BURSA          Procedure note: After discussion of risks and benefits including, but not limited to pain, infection, steroid flare, increased blood sugar, fat necrosis, skin discoloration, and injury to blood vessels or nerves, the patient verbally consented to proceed with a glenohumeral joint injection.  The affected right shoulder was sterilely prepped in standard  fashion and injected with 2 cc of depo medrol  (40mg /ml), 2 cc of 1% Lidocaine, and 2 cc of 0.5% Marcaine into the joint space.  The patient tolerated the injection well.     No follow-up provider specified.     [place], [place]  07/10/23

## 2023-07-13 ENCOUNTER — Encounter: Admit: 2023-07-13 | Payer: MEDICARE | Admitting: Nurse Practitioner | Primary: Internal Medicine

## 2023-07-13 VITALS — BP 128/76 | HR 50 | Temp 97.40000°F | Resp 18 | Ht 67.0 in | Wt 145.2 lb

## 2023-07-13 DIAGNOSIS — J449 Chronic obstructive pulmonary disease, unspecified: Principal | ICD-10-CM

## 2023-07-13 LAB — SPIROMETRY WITHOUT BRONCHODILATOR
FEV1 %Pred-Pre: 50 %
FEV1 Pred: 2.03 L
FEV1/FVC: 54 %
FEV1: 1.01 L
FVC %Pred-Pre: 69 %
FVC Pred: 2.7 L
FVC: 1.86 L

## 2023-07-13 NOTE — Progress Notes (Signed)
 Name:  Audrey Snow  Date of Birth:  [birth year]   MRN: [id number]      Office Visit: 07/13/2023       Assessment & Plan (Medical Decision Making)    Impression: 83 y.o. female     1. COPD, moderate (HCC)  --spirometry stable.  Maintained on prn albuterol  only.  Low symptom burden.  - Spirometry Without Bronchodilator    2. Personal history of tobacco use  --24 pack quit, quit in 1985.  No does qualify for LDCT.    3. Adenocarcinoma of right lung, stage 2 (HCC)  --no evidence of recurrence.  Has upcoming scan next month and then sees Dr. Mertha in early March.        Follow-up and Dispositions    Return in about 1 year (around 07/12/2024) for Audrey Snow, COPD, Arrive 15 minutes prior to appt time, FI office, spirometry.     Collaborating physician is Dr. Carlin Nigh.    ADS    Audrey CHRISTELLA Lofty, APRN - CNP      _________________________________________________________________________    HISTORY OF PRESENT ILLNESS:    Ms. Audrey Snow is a 83 y.o. female who is seen at [facility] today for  COPD     The patient is an extremely pleasant 83 year old female who is seen for follow up of COPD and history of adenocarcinoma.  She is accompanied by her granddaughter.  She has a history of vitamin D deficiency, hypothyroidism, RA (Dr. Elza MTX), osteoporosis, HTN, extreme hearing loss, and GERD.  Her granddaughter assists with the history due to her severe hearing loss.  She has previously been followed by [facility] for COPD.  She  has a 21 pack year history of cigarette smoking, but quit in 1985.       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 Signe Cave.  She had a near syncopal episode in July 2021 which led to a CT of  chest which demonstrated an incidental finding of a 5 cm RLL mass.  This led to IR biopsy of right lung mass which was positive for adenocarcinoma.  Was presented at tumor board--recommendation was for surgical resection and adjuvant  chemotherapy.  Had right VATS and RLLobectomy, mediastinal lymphadenctomy, and cryoablation of intercostal  nerves on 05/07/20 by Dr. Tamsen.   Continues to be followed by Dr. Mertha.  Last CT was 09/02/22 which demonstrated no recurrence of her cancer!     Has previously tried Anoro, but did not perceive any benefit and thus she is has an albuterol  inhaler only, which she has not used since last visit.  Denies any cough or wheezing.  Has mild DOE which is unchanged.  Gets winded when rushing or getting over-exerted.   Gets more short of breath during hot humid weather.      REVIEW OF SYSTEMS: 10 point review of systems is negative except as reported in HPI.    PHYSICAL EXAM: Body mass index is 22.74 kg/m.  Vitals:    07/13/23 1052   BP: 128/76   Pulse: 50   Resp: 18   Temp: 97.4 F (36.3 C)   TempSrc: Temporal   SpO2: 96%   Weight: 65.9 kg (145 lb 3.2 oz)   Height: 1.702 m (5' 7)         General:   Alert, cooperative, no distress, appears stated age.  Eyes:   Conjunctivae/corneas clear. PERRL        Mouth/Throat:  Lips, mucosa, and tongue normal. Teeth and gums normal.        Lungs:     Breath sounds minimally decreased bilaterally, but clear.     Heart:   Regular rate and rhythm, S1, S2 normal, no murmur, click, rub or gallop.     Abdomen:    Soft, non-tender.     Extremities:  Extremities normal, atraumatic, no cyanosis or edema.     Skin:  Skin color normal. No rashes or lesions     Neurologic:  A&Ox3     DIAGNOSTIC TESTS:                                                                                    LABS:   Lab Results   Component Value Date/Time    WBC 6.8 06/24/2023 03:44 PM    HGB 14.1 06/24/2023 03:44 PM    HCT 43.2 06/24/2023 03:44 PM    PLT 207 06/24/2023 03:44 PM    TSH 159.000 06/24/2023 03:44 PM    CRP <0.3 06/24/2023 03:44 PM     Imaging: I performed an independent interpretation of the patient's images.  CXR:   XR SHOULDER RIGHT (MIN 2 VIEWS) 08/05/2022    Narrative  AUTOMATIC ADMINISTRATIVE  RESULT    The result for this exam can be found in the Progress note in the chart.    Impression  See Progress note in the chart.    CT Chest:   CT CHEST ABDOMEN PELVIS W CONTRAST 09/02/2022    Narrative  CT of the Chest, Abdomen, and Pelvis    INDICATION: Right lung adenocarcinoma. Restaging.    TECHNIQUE: Multiple 2D axial images were obtained through the chest, abdomen,  and pelvis.  Oral contrast was used for bowel opacification.  100mL of Isovue   370 intravenous contrast was used for better evaluation of solid organs and  vascular structures.  Radiation dose reduction techniques were used for this  study.  All CT scans performed at this facility use one or all of the following:  Automated exposure control, adjustment of the mA and/or kVp according to  patient's size, iterative reconstruction.    COMPARISON: CT chest abdomen pelvis September 2023. CT chest abdomen pelvis  August 2022.    FINDINGS:  - LUNGS: Prior right lower lobectomy. No evidence of recurrent or pulmonary  metastatic disease. No pneumonia. No pleural effusion.  - MEDIASTINUM/AXILLA: No significant adenopathy.  - HEART/VESSELS: Mild cardiomegaly. Small pericardial effusion is chronic.  Coronary artery disease  - CHEST WALL: No thoracic osseous metastatic disease. Degenerative disc disease  of the spine. No vertebral compression fracture.    - LIVER: Normal.  - GALLBLADDER/BILE DUCTS: Normal gallbladder and bile ducts.  - PANCREAS: Normal.  - SPLEEN: Normal.    - ADRENALS:  Normal.  - KIDNEYS/URETERS: No hydronephrosis or significant mass.  - BLADDER: Decompressed urinary bladder.  - REPRODUCTIVE ORGANS: Prominent parauterine veins. No uterine mass. No adnexal  mass.    - BOWEL: Colonic diverticulosis without diverticulitis. Normal appendix.  - LYMPH NODES: Diffuse degenerative changes in the spine. No  vertebral fracture.  No osseous metastatic disease.  - BONES: No fracture or significant bone lesion.  - VASCULATURE: Severe plaque of the  abdominal aorta with 3 cm infrarenal  aneurysm, previously 2.9 cm in September 2023 using same measurement technique.  It measured 2.7 cm in August 2022  - OTHER: No ascites.    Impression  No recurrent or metastatic disease in the chest, abdomen, or pelvis. Infrarenal  abdominal aortic aneurysm, 3 cm, slowly increasing.    Nuclear Medicine:   PET CT SKULL BASE TO MID THIGH 11/08/2020    Narrative  PET/CT    Indication: Restaging right lung cancer    Radiopharmaceutical: 10.78 mCi F18-FDG, intravenously.    Technique: Imaging was performed from the skull through the proximal thighs  using routine PET/CT acquisition protocol. Imaging was performed approximately  60 minutes post injection. Oral contrast was administered. Radiation dose  reduction techniques were used for this study:  Our CT scanners use one or all  of the following: Automated exposure control, adjustment of the mA and/or kVp  according to patient's size, iterative reconstruction.      Serum glucose: 104 mg/dL prior to injection.    Comparison studies: CT chest 10/22/2020, PET/CT 04/02/2020    Findings:    Head and Neck: Stable hypermetabolic nodule in the left parotid gland.    Chest: Multivessel coronary atherosclerotic calcifications. Aortic  atherosclerotic calcifications without aneurysm. The lungs are emphysematous. No  recurrent mass or suspicious elevated FDG uptake.    No enlarged or hypermetabolic hilar or mediastinal lymph nodes.    Abdomen/Pelvis: No lymphadenopathy. No worrisome focal uptake in the abdominal  viscera. Normal uterus. No adnexal mass. Aortic atherosclerotic calcifications  without aneurysm.    Bones: No aggressive bone lesion or worrisome focal FDG uptake.    Impression  No recurrent mass in the right lung. No evidence of distant metastatic disease.    PFTs:       Latest Ref Rng & Units 07/13/2023    10:58 AM 01/01/2022    10:16 AM 07/09/2021    11:46 AM   Office Spirometry Results   FVC L 1.86  2.11  C 2.40  C   FEV1 L 1.01  1.11   C 1.27  C   FEV1 %Pred-Pre % 50  51  C 58  C   FVC %Pred-Pre % 69  73  C 82  C   FEV1/FVC % 54  53  C 53  C      C Corrected result         No results found for this or any previous visit. No results found for this or any previous visit.    FeNO: No results found for this or any previous visit.  FeNO and Likelihood of Eosinophilic Asthma   Unlikely Intermediate Likely   <25 ppb 25-50 ppb >50ppb     Exercise Oximetry:    Echo: No results found for this or any previous visit from the past 3650 days.    PMH Reference Info:  Immunization History   Administered Date(s) Administered    COVID-19, PFIZER PURPLE top, DILUTE for use, (age 84 y+), 88mcg/0.3mL 07/29/2019, 08/15/2019, 03/12/2020    Influenza Virus Vaccine 04/13/2016, 04/07/2019, 06/06/2022    Influenza, FLUAD, (age 73 y+), IM, Quadv, 0.36mL 03/24/2019, 04/25/2021    Influenza, FLUAD, (age 46 y+), IM, Trivalent PF, 0.5mL 02/08/2018, 06/24/2023    Influenza, FLUZONE High Dose, (age 43 y+), IM, Trivalent PF, 0.5mL 04/22/2011, 03/17/2013, 07/04/2014, 04/06/2015, 05/21/2016, 05/04/2017, 03/21/2020    Influenza, Trivalent PF 06/22/2003, 06/10/2005, 06/03/2006, 04/19/2008, 05/07/2009, 05/24/2010    PPD Test 06/13/2003, 10/31/2004, 12/16/2005, 02/01/2007, 04/26/2008, 05/23/2009    Pneumococcal, PCV-13, PREVNAR 13, (age 6w+), IM, 0.41mL 07/19/2015    Pneumococcal, PPSV23, PNEUMOVAX 23, (age 2y+), SC/IM, 0.48mL 03/29/2010    Zoster Recombinant (Shingrix) 02/08/2018, 05/29/2018     Past Medical History:   Diagnosis Date    Arrhythmia 04/05/2020    PVCs.  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    Chronic  obstructive pulmonary disease (HCC)     uses inhalers    Chronic pain of left knee 06/25/2018    Coronary atherosclerosis     noted on chest CT    COVID-19 vaccine series completed 08/15/2019    Pfizer; 07/29/2019 and booster given 03/12/2020    GERD (gastroesophageal reflux disease)     no meds at this time    H/O cardiovascular stress test     Bruce protocol, low risk study, EF of 67% and normal LV function    H/O mammogram 07/28/2016    mammogram and Ultrasound of right breaset- benign finding- [facility]     Hearing loss     only hears out of left ear    Hearing loss of both ears     no hearing aids     HTN (hypertension), benign 01/30/2014    controlled w/med    Hypothyroid     on med for control    Menopause     Osteoarthritis     Osteoporosis     prolia     Poor historian     patient had trouble remembering names of meds and what they were for and had to repeat instructions for surgery several times     Rheumatoid arthritis(714.0) 01/30/2014    followed by Dr. Leonia. 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 Osa    Unspecified hypothyroidism 01/30/2014    thyroid medication    Vitamin D deficiency     medications for this         Tobacco Use      Smoking status: Former        Packs/day: 0.00        Years: 1 pack/day for 24.0 years (24.0 ttl pk-yrs)        Types: Cigarettes        Start date: 07/08/1959        Quit date: 07/08/1983        Years since quitting: 40.0      Smokeless tobacco: Never      Tobacco comments: Quit smoking: not regular when first  started- 1pk/week    Allergies   Allergen Reactions    Yeast Nausea And Vomiting     Pt. Says she hasnt had issues with this in a long time    Yeast-Derived Drug Products Nausea And Vomiting and Headaches     Pt STATED NOT ALLERGIC     Current Outpatient Medications   Medication Instructions    albuterol  sulfate HFA (PROVENTIL ;VENTOLIN ;PROAIR ) 108 (90 Base) MCG/ACT inhaler 1 puff, Inhalation, EVERY 4 HOURS PRN     atorvastatin  (LIPITOR) 20 mg, Oral, DAILY    Cholecalciferol 100 MCG (4000 UT) TABS 1 tablet, DAILY    folic acid  (FOLVITE ) 1 mg, Oral, DAILY    levothyroxine  (SYNTHROID ) 125 mcg, Oral, DAILY    losartan  (COZAAR ) 100 mg, Oral, DAILY, TAKE 1 TABLET BY MOUTH DAILY    metoprolol  succinate (TOPROL  XL) 50 mg, Oral, DAILY

## 2023-07-15 MED ORDER — ATORVASTATIN CALCIUM 20 MG PO TABS
20 | ORAL_TABLET | Freq: Every day | ORAL | 2 refills | 90.00000 days | Status: DC
Start: 2023-07-15 — End: 2024-02-09

## 2023-07-16 MED ORDER — ALBUTEROL SULFATE HFA 108 (90 BASE) MCG/ACT IN AERS
108 | RESPIRATORY_TRACT | 11 refills | Status: AC | PRN
Start: 2023-07-16 — End: ?

## 2023-07-16 NOTE — Telephone Encounter (Signed)
Rx sent to pharmacy as requested.

## 2023-07-16 NOTE — Telephone Encounter (Signed)
Patient requesting a new prescription for her albuterol sulfate HFA (PROVENTIL;VENTOLIN;PROAIR) 108 (90 Base) MCG/ACT inhaler. Last seen by Linus Salmons, NP on 07/13/23. JAB

## 2023-07-17 ENCOUNTER — Encounter

## 2023-08-31 ENCOUNTER — Inpatient Hospital Stay: Admit: 2023-08-31 | Payer: MEDICARE | Primary: Internal Medicine

## 2023-08-31 DIAGNOSIS — C3491 Malignant neoplasm of unspecified part of right bronchus or lung: Secondary | ICD-10-CM

## 2023-08-31 LAB — POCT CREATININE - BLOOD
POC Creatinine: 1.19 mg/dL (ref 0.8–1.5)
eGFR, POC: 45 mL/min/{1.73_m2} — ABNORMAL LOW (ref >60)

## 2023-08-31 MED ORDER — DIATRIZOATE MEGLUMINE & SODIUM 66-10 % PO SOLN
66-10 % | Freq: Once | ORAL | Status: AC | PRN
Start: 2023-08-31 — End: 2023-09-03
  Administered 2023-08-31: 18:00:00 15 mL via ORAL

## 2023-08-31 MED ORDER — IOPAMIDOL 76 % IV SOLN
76 | Freq: Once | INTRAVENOUS | Status: AC | PRN
Start: 2023-08-31 — End: 2023-08-31
  Administered 2023-08-31: 18:00:00 100 mL via INTRAVENOUS

## 2023-08-31 MED FILL — ISOVUE-370 76 % IV SOLN: 76 % | INTRAVENOUS | Qty: 100

## 2023-08-31 MED FILL — GASTROGRAFIN 66-10 % PO SOLN: 66-10 % | ORAL | Qty: 30

## 2023-09-09 ENCOUNTER — Inpatient Hospital Stay: Admit: 2023-09-09 | Payer: MEDICARE | Primary: Internal Medicine

## 2023-09-09 ENCOUNTER — Ambulatory Visit: Admit: 2023-09-09 | Discharge: 2023-09-09 | Payer: MEDICARE | Attending: Internal Medicine | Primary: Internal Medicine

## 2023-09-09 VITALS — BP 120/84 | HR 71 | Temp 97.80000°F | Resp 12 | Ht 67.0 in | Wt 136.2 lb

## 2023-09-09 DIAGNOSIS — C3491 Malignant neoplasm of unspecified part of right bronchus or lung: Secondary | ICD-10-CM

## 2023-09-09 LAB — COMPREHENSIVE METABOLIC PANEL
ALT: 18 U/L (ref 8–45)
AST: 24 U/L (ref 15–37)
Albumin/Globulin Ratio: 1.1 (ref 1.0–1.9)
Albumin: 4 g/dL (ref 3.2–4.6)
Alk Phosphatase: 45 U/L (ref 35–104)
Anion Gap: 13 mmol/L (ref 7–16)
BUN: 24 mg/dL — ABNORMAL HIGH (ref 8–23)
CO2: 25 mmol/L (ref 20–29)
Calcium: 9.9 mg/dL (ref 8.8–10.2)
Chloride: 101 mmol/L (ref 98–107)
Creatinine: 1.37 mg/dL — ABNORMAL HIGH (ref 0.60–1.10)
Est, Glom Filt Rate: 38 mL/min/{1.73_m2} — ABNORMAL LOW (ref >60)
Globulin: 3.6 g/dL — ABNORMAL HIGH (ref 2.3–3.5)
Glucose: 105 mg/dL — ABNORMAL HIGH (ref 70–99)
Potassium: 3.7 mmol/L (ref 3.5–5.1)
Sodium: 139 mmol/L (ref 136–145)
Total Bilirubin: 1.2 mg/dL (ref 0.0–1.2)
Total Protein: 7.5 g/dL (ref 6.3–8.2)

## 2023-09-09 LAB — CBC WITH AUTO DIFFERENTIAL
Basophils %: 0.6 % (ref 0.0–2.0)
Basophils Absolute: 0.04 10*3/uL (ref 0.00–0.20)
Eosinophils %: 2.2 % (ref 0.5–7.8)
Eosinophils Absolute: 0.16 10*3/uL (ref 0.00–0.80)
Hematocrit: 42 % (ref 35.8–46.3)
Hemoglobin: 13.9 g/dL (ref 11.7–15.4)
Immature Granulocytes %: 0.1 % (ref 0.0–5.0)
Immature Granulocytes Absolute: 0.01 10*3/uL (ref 0.00–0.50)
Lymphocytes %: 30.9 % (ref 13.0–44.0)
Lymphocytes Absolute: 2.2 10*3/uL (ref 0.50–4.60)
MCH: 33.5 pg — ABNORMAL HIGH (ref 26.1–32.9)
MCHC: 33.1 g/dL (ref 31.4–35.0)
MCV: 101.2 FL (ref 82.0–102.0)
MPV: 9.9 FL (ref 9.4–12.3)
Monocytes %: 10.8 % (ref 4.0–12.0)
Monocytes Absolute: 0.77 10*3/uL (ref 0.10–1.30)
Neutrophils %: 55.4 % (ref 43.0–78.0)
Neutrophils Absolute: 3.94 10*3/uL (ref 1.70–8.20)
Platelets: 230 10*3/uL (ref 150–450)
RBC: 4.15 M/uL (ref 4.05–5.2)
RDW: 13.8 % (ref 11.9–14.6)
WBC: 7.1 10*3/uL (ref 4.3–11.1)
nRBC: 0 10*3/uL (ref 0.0–0.2)

## 2023-09-09 LAB — VITAMIN D 25 HYDROXY: Vit D, 25-Hydroxy: 70.4 ng/mL (ref 30.0–100.0)

## 2023-09-09 NOTE — Progress Notes (Signed)
 [facility]: Office Visit Established Patient    Chief Complaint:    Chief Complaint   Patient presents with    Follow-up         History of Present Illness:  Audrey Snow is a 83 y.o. female who returns today for management of adenocarcinoma of the  lung.  On 02/27/20, [name] presented to [facility] ED with complaints of achiness in chest with radiation of pain to her left upper extremity with association of N/V, SOB and diaphoresis. Cardiac work-up was negative; however her chest x-ray reported a 5.1 cm rounded opacity at the right lung base.  She saw her PCP on 02/29/20, CT scan was ordered and reported a 5 cm mass with adjacent pleural nodularity posteriorly at the right lung base and considered suspicious for bronchogenic carcinoma. On 04/02/20 she  had a PET/CT scan that reported a 6 cm right lower lobe pulmonary mass with no metastatic disease.  Biopsy showed adenocarcinoma of lung origin.  We saw her prior to surgical evaluation and recommended proceeding with lobectomy and lymphadenectomy if  she was a surgical candidate.  She completed VATS with left lower lobectomy as well as chest wall mass resection and lymphadenectomy on 05/07/20.  Her tumor was 6.4 cm in greatest dimension with carcinoma in the specimen marked "chest wall" but with separation from tumor by connective tissue and lung parenchyma.  13 lymph node fragments were all negative for tumor.  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  Because of the T3N0 disease, we would consider adjuvant  chemotherapy, likely cisplatin and pemetrexed for 4 cycles.  We do also have the ALCHEMIST trial available, but unfortunately she is not a candidate due to her history of RA with MTX use.  She completed 4 cycles of cisplatin and Alimta with adequate tolerance.  CT chest at the conclusion of therapy reviewed and shows nothing of suspicion in the lung.  However, she does have a 1.4 cm adrenal nodule  which was not obviously present on pretherapy imaging.  We recommended PET/CT to assess but this shows no significant uptake in the left adrenal or other areas of concern.  We will continue observation with repeat CT in 3 months. CT reviewed February 2024 and shows no evidence of recurrent disease.  She can now change to annual follow-up.           History of Present Illness  The patient is an 83 year old female here for follow-up of a history of lung cancer status post definitive surgery in November 2021 followed by 4 cycles of cis/Alimta.    She reports a decrease in her respiratory function, which she attributes to the natural aging process. She is not experiencing any cough or shortness of breath.  She is currently suffering from a cold, characterized by a runny nose. Additionally, she mentions shoulder pain.       Review of Systems:  Constitutional: Positive for fatigue.   HENT: Negative.   Eyes: Negative.   Respiratory: Positive for dyspnea (stable).   Cardiovascular: Negative.   Gastrointestinal: Negative.   Genitourinary: Negative.   Musculoskeletal: Negative.   Skin: Negative.   Neurological: Negative.   Endo/Heme/Allergies: Negative.   Psychiatric/Behavioral: Negative.   All other systems reviewed and are negative.       Allergies   Allergen Reactions    Yeast Nausea And Vomiting     Pt. Says she hasnt had issues  with this in a long time    Yeast-Derived Drug Products Nausea And Vomiting and Headaches     Pt STATED NOT ALLERGIC     Past Medical History:   Diagnosis Date    Arrhythmia 04/05/2020    PVCs.  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    Chronic obstructive pulmonary disease (HCC)     uses inhalers    Chronic pain of left knee 06/25/2018     Coronary atherosclerosis     noted on chest CT    COVID-19 vaccine series completed 08/15/2019    Pfizer; 07/29/2019 and booster given 03/12/2020    GERD (gastroesophageal reflux disease)     no meds at this time    H/O cardiovascular stress test     Bruce protocol, low risk study, EF of 67% and normal LV function    H/O mammogram 07/28/2016    mammogram and Ultrasound of right breaset- benign finding- [facility]     Hearing loss     "only hears out of left ear"    Hearing loss of both ears     no hearing aids     HTN (hypertension), benign 01/30/2014    controlled w/med    Hypothyroid     on med for control    Menopause     Osteoarthritis     Osteoporosis     prolia     Poor historian     patient had trouble remembering names of meds and what they were for and had to repeat instructions for surgery several times     Rheumatoid arthritis(714.0) 01/30/2014    followed by Dr. Odis Luster. 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 Carmela Hurt    Unspecified hypothyroidism 01/30/2014    thyroid medication    Vitamin D deficiency     medications for this      Past Surgical History:   Procedure Laterality Date    CARPAL TUNNEL RELEASE Bilateral     COLONOSCOPY      colon polyps-adenomatous, has seen Dr. Leron Croak in past    COLONOSCOPY N/A 01/27/2018    COLONOSCOPY/BMI 26 performed by Adalberto Ill, MD at [facility]    COLONOSCOPY FLX DX W/COLLJ SPEC WHEN PFRMD  01/27/2018         CT NEEDLE BIOPSY LUNG PERCUTANEOUS W IMAGING GUIDANCE  04/16/2020    CT NEEDLE BIOPSY LUNG PERCUTANEOUS 04/16/2020 [facility] CT SCAN    HEMORRHOID SURGERY      OTHER SURGICAL HISTORY  09/2012    sinus surgery-Dr. Boris Lown    OTHER SURGICAL HISTORY  01/27/2013    Thymoma-Dr. Carmela Hurt    TONSILLECTOMY  83 yrs old    UPPER GASTROINTESTINAL ENDOSCOPY  2015    Gastric AVM txed with cautery-Dr. Leilani Able     Family History   Problem Relation Age of Onset    Osteoarthritis Mother     Breast Cancer Neg Hx      Alzheimer's Disease Father      Social History     Socioeconomic History    Marital status: Widowed  Spouse name: Not on file    Number of children: Not on file    Years of education: Not on file    Highest education level: Not on file   Occupational History    Not on file   Tobacco Use    Smoking status: Former     Current packs/day: 0.00     Average packs/day: 1 pack/day for 24.0 years (24.0 ttl pk-yrs)     Types: Cigarettes     Start date: 07/08/1959     Quit date: 07/08/1983     Years since quitting: 40.2    Smokeless tobacco: Never    Tobacco comments:     Quit smoking: not regular when first started- 1pk/week   Substance and Sexual Activity    Alcohol use: No     Alcohol/week: 0.0 standard drinks of alcohol    Drug use: No    Sexual activity: Not Currently     Partners: Male   Other Topics Concern    Not on file   Social History Narrative    Widowed since 06/29/21. Works part time at [employer] in [employer].     Social Determinants of Health     Financial Resource Strain: Patient Declined (12/13/2022)    Overall Financial Resource Strain (CARDIA)     Difficulty of Paying Living Expenses: Patient declined   Food Insecurity: Patient Declined (12/13/2022)    Hunger Vital Sign     Worried About Running Out of Food in the Last Year: Patient declined     Ran Out of Food in the Last Year: Patient declined   Transportation Needs: Unknown (12/13/2022)    PRAPARE - Therapist, art (Medical): Not on file     Lack of Transportation (Non-Medical): No   Physical Activity: Not on file (09/23/2019)   Stress: Not on file   Social Connections: Unknown (09/23/2019)    Received from [facility], [facility]    Social Connections     Frequency of Communication with Friends and Family: Not asked     Frequency of Social Gatherings with Friends and Family: Not asked   Intimate Partner Violence: Unknown (09/23/2019)    Received from [facility], [facility]    Intimate Partner Violence     Fear of Current or Ex-Partner:  Not asked     Emotionally Abused: Not asked     Physically Abused: Not asked     Sexually Abused: Not asked   Housing Stability: Unknown (12/13/2022)    Housing Stability Vital Sign     Unable to Pay for Housing in the Last Year: Not on file     Number of Places Lived in the Last Year: Not on file     Unstable Housing in the Last Year: No     Current Outpatient Medications   Medication Sig Dispense Refill    methotrexate (RHEUMATREX) 2.5 MG chemo tablet Take 4 tablets by mouth once a week      albuterol sulfate HFA (PROVENTIL;VENTOLIN;PROAIR) 108 (90 Base) MCG/ACT inhaler Inhale 1 puff into the lungs every 4 hours as needed for Shortness of Breath or Wheezing 18 g 11    atorvastatin (LIPITOR) 20 MG tablet Take 1 tablet by mouth daily 90 tablet 2    folic acid (FOLVITE) 1 MG tablet Take 1 tablet by mouth daily      levothyroxine (SYNTHROID) 125 MCG tablet Take 1 tablet by mouth Daily 90 tablet 0    losartan (COZAAR)  100 MG tablet Take 1 tablet by mouth daily TAKE 1 TABLET BY MOUTH DAILY 90 tablet 0    metoprolol succinate (TOPROL XL) 50 MG extended release tablet Take 1 tablet by mouth daily 90 tablet 0    Cholecalciferol 100 MCG (4000 UT) TABS Take 1 tablet by mouth daily (Patient not taking: Reported on 07/13/2023)       No current facility-administered medications for this visit.       OBJECTIVE:  BP 120/84   Pulse 71   Temp 97.8 F (36.6 C) (Oral)   Resp 12   Ht 1.702 m (5\' 7" )   Wt 61.8 kg (136 lb 3.2 oz)   SpO2 97%   BMI 21.33 kg/m     Physical Exam:  Constitutional: Well developed, well nourished female in no acute distress, sitting comfortably in the exam room chair.    HEENT: Normocephalic and atraumatic. Sclerae anicteric. Neck supple without JVD. No thyromegaly present.    Lymph node   No palpable submandibular, cervical, supraclavicular lymph nodes.   Skin Warm and dry.  No bruising and no rash noted.  No erythema.  No pallor.    Respiratory Lungs are clear to auscultation bilaterally without  wheezes, rales or rhonchi, normal air exchange without accessory muscle use.    CVS Normal rate, regular rhythm and normal S1 and S2.  No murmurs, gallops, or rubs.   Abdomen Soft, nontender and nondistended, normoactive bowel sounds.  No palpable mass.  No hepatosplenomegaly.   Neuro Grossly nonfocal with no obvious sensory or motor deficits.   MSK Normal range of motion in general.  No edema and no tenderness.   Psych Appropriate mood and affect.      Labs:  Recent Results (from the past 96 hour(s))   Comprehensive Metabolic Panel    Collection Time: 09/09/23  9:03 AM   Result Value Ref Range    Sodium 139 136 - 145 mmol/L    Potassium 3.7 3.5 - 5.1 mmol/L    Chloride 101 98 - 107 mmol/L    CO2 25 20 - 29 mmol/L    Anion Gap 13 7 - 16 mmol/L    Glucose 105 (H) 70 - 99 mg/dL    BUN 24 (H) 8 - 23 MG/DL    Creatinine 3.08 (H) 0.60 - 1.10 MG/DL    Est, Glom Filt Rate 38 (L) >60 ml/min/1.55m2    Calcium 9.9 8.8 - 10.2 MG/DL    Total Bilirubin 1.2 0.0 - 1.2 MG/DL    ALT 18 8 - 45 U/L    AST 24 15 - 37 U/L    Alk Phosphatase 45 35 - 104 U/L    Total Protein 7.5 6.3 - 8.2 g/dL    Albumin 4.0 3.2 - 4.6 g/dL    Globulin 3.6 (H) 2.3 - 3.5 g/dL    Albumin/Globulin Ratio 1.1 1.0 - 1.9     CBC with Auto Differential    Collection Time: 09/09/23  9:03 AM   Result Value Ref Range    WBC 7.1 4.3 - 11.1 K/uL    RBC 4.15 4.05 - 5.2 M/uL    Hemoglobin 13.9 11.7 - 15.4 g/dL    Hematocrit 65.7 84.6 - 46.3 %    MCV 101.2 82.0 - 102.0 FL    MCH 33.5 (H) 26.1 - 32.9 PG    MCHC 33.1 31.4 - 35.0 g/dL    RDW 96.2 95.2 - 84.1 %    Platelets 230 150 - 450  K/uL    MPV 9.9 9.4 - 12.3 FL    nRBC 0.00 0.0 - 0.2 K/uL    Neutrophils % 55.4 43.0 - 78.0 %    Lymphocytes % 30.9 13.0 - 44.0 %    Monocytes % 10.8 4.0 - 12.0 %    Eosinophils % 2.2 0.5 - 7.8 %    Basophils % 0.6 0.0 - 2.0 %    Immature Granulocytes % 0.1 0.0 - 5.0 %    Neutrophils Absolute 3.94 1.70 - 8.20 K/UL    Lymphocytes Absolute 2.20 0.50 - 4.60 K/UL    Monocytes Absolute 0.77 0.10 -  1.30 K/UL    Eosinophils Absolute 0.16 0.00 - 0.80 K/UL    Basophils Absolute 0.04 0.00 - 0.20 K/UL    Immature Granulocytes Absolute 0.01 0.00 - 0.50 K/UL    Differential Type AUTOMATED         Imaging:  CT CHEST ABDOMEN PELVIS W CONTRAST Additional Contrast? Radiologist Recommendation    Result Date: 09/04/2023  CT of the Chest, Abdomen, and Pelvis INDICATION: Malignant neoplasm of lung, restaging. TECHNIQUE: Multiple 2D axial images were obtained through the chest, abdomen, and pelvis.  Oral contrast was used for bowel opacification.  of Isovue 370 intravenous contrast was used for better evaluation of solid organs and vascular structures.  Radiation dose reduction techniques were used for this study.  All CT scans performed at this facility use one or all of the following: Automated exposure control, adjustment of the mA and/or kVp according to patient's size, iterative reconstruction. COMPARISON: None FINDINGS: - LUNGS: Mild/moderate emphysema/chronic bronchitis. No suspicious lung nodule or mass. Right lower lobectomy. No local recurrence is identified. - MEDIASTINUM/AXILLA: Atrophic thyroid. No adenopathy. - HEART/VESSELS: Cardiomegaly with small pericardial effusion. - CHEST WALL: No chest wall mass or fluid collection. No fracture or osseous metastatic disease. - LIVER: Normal in size and appearance.  - GALLBLADDER/BILE DUCTS: Gallbladder is unremarkable. No biliary dilatation. - PANCREAS: Normal. - SPLEEN: Normal. - ADRENALS:  Normal. - KIDNEYS/URETERS: Unremarkable. - BLADDER: Nondistended urinary bladder. - REPRODUCTIVE ORGANS: Prominent periuterine veins suggesting pelvic congestion syndrome. Left ovarian vein is also dilated. No uterine mass. No adnexal mass. - BOWEL: Colonic diverticulosis without diverticulitis. No acute GI tract abnormality. Normal appendix. - LYMPH NODES: No significant retroperitoneal, mesenteric, or pelvic adenopathy. - BONES: No fracture or significant bone lesion.  Moderate/severe degenerative disc disease at L4-L5. - VASCULATURE: Severe aortoiliac plaque. 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 At least moderate stenoses of the mesenteric arteries and renal artery origins. - OTHER: No ascites.     No recurrent or metastatic disease in the chest, abdomen, or pelvis. Electronically signed by Elsie Stain        CT CHEST ABDOMEN PELVIS W CONTRAST Additional Contrast? None    Result Date: 09/03/2022  CT of the Chest, Abdomen, and Pelvis INDICATION: Right lung adenocarcinoma. Restaging. TECHNIQUE: Multiple 2D axial images were obtained through the chest, abdomen, and pelvis.  Oral contrast was used for bowel opacification.  of Isovue 370 intravenous contrast was used for better evaluation of solid organs and vascular structures.  Radiation dose reduction techniques were used for this study.  All CT scans performed at this facility use one or all of the following: Automated exposure control, adjustment of the mA and/or kVp according to patient's size, iterative reconstruction. COMPARISON: CT chest abdomen pelvis September 2023. CT chest abdomen pelvis August 2022. FINDINGS: - LUNGS: Prior right lower lobectomy. No evidence of recurrent or  pulmonary metastatic disease. No pneumonia. No pleural effusion. - MEDIASTINUM/AXILLA: No significant adenopathy. - HEART/VESSELS: Mild cardiomegaly. Small pericardial effusion is chronic. Coronary artery disease - CHEST WALL: No thoracic osseous metastatic disease. Degenerative disc disease of the spine. No vertebral compression fracture. - LIVER: Normal. - GALLBLADDER/BILE DUCTS: Normal gallbladder and bile ducts. - PANCREAS: Normal. - SPLEEN: Normal. - ADRENALS:  Normal. - KIDNEYS/URETERS: No hydronephrosis or significant mass. - BLADDER: Decompressed urinary bladder. - REPRODUCTIVE ORGANS: Prominent parauterine veins. No uterine mass. No adnexal mass. - BOWEL: Colonic diverticulosis without diverticulitis. Normal appendix. - LYMPH  NODES: Diffuse degenerative changes in the spine. No vertebral fracture. No osseous metastatic disease. - BONES: No fracture or significant bone lesion. - VASCULATURE: Severe plaque of the abdominal aorta with 3 cm infrarenal aneurysm, previously 2.9 cm in September 2023 using same measurement technique. It measured 2.7 cm in August 2022 - OTHER: No ascites.     No recurrent or metastatic disease in the chest, abdomen, or pelvis. Infrarenal abdominal aortic aneurysm, 3 cm, slowly increasing.           CT CHEST ABDOMEN PELVIS W CONTRAST Additional Contrast? None    Result Date: 08/29/2021  CT CHEST ABDOMEN PELVIS W CONTRAST 08/29/2021 10:26 AM HISTORY: Follow-up adenocarcinoma of the right lung. Palpable abnormality right axilla and right abdomen. COMPARISON: CT chest abdomen pelvis 02/08/2021. TECHNIQUE: Multiple axial images were obtained through the chest, abdomen, and pelvis.  Oral contrast was used for bowel opacification.  100 mL of Isovue 370 intravenous contrast was used for better evaluation of solid organs and vascular structures.  Radiation dose reduction techniques were used for this study.  All CT scans performed at this facility use one or all of the following: Automated exposure control, adjustment of the mA and/or kVp according to patient's size, iterative reconstruction. FINDINGS: LUNGS AND PLEURA: Centrilobular emphysema. Volume loss right hemithorax consistent with partial right lung resection. No evidence of recurrent mass. No pleural effusions. MEDIASTINUM/AXILLAE: Heart is enlarged. No pericardial effusion. Esophagus is unremarkable. Thyroid gland not visualized. No enlarged lymph nodes. Scattered coronary artery calcifications are present. HEPATOBILIARY: Normal. PANCREAS: Normal. SPLEEN: Normal. ADRENAL GLANDS: Normal. KIDNEYS/BLADDER: Kidneys and bladder are unremarkable. No hydronephrosis or renal calculi. BOWEL: No bowel obstruction. Appendix is normal. LYMPH NODES: No enlarged lymph nodes.  VASCULATURE: Calcified and noncalcified plaque abdominal aorta without significant aneurysmal dilatation or dissection. PELVIC ORGANS: Uterus and rectum are unremarkable. No free pelvic fluid. No pelvic mass. MUSCULOSKELETAL: Degenerative spine changes. No destructive bone lesions are appreciated. No abnormality noted in the area of patient's palpable lesions right axilla on image 25 of series 2 and in the anterior right abdominal wall on image 73.     1. No evidence of recurrent or metastatic disease in the chest, abdomen or pelvis. Changes of partial right lung resection. 2. No CT findings in the area of patient's palpable abnormalities right axilla and right anterior lateral abdominal wall.           CT CHEST ABDOMEN PELVIS W CONTRAST    Result Date: 02/08/2021  EXAM: CT CHEST, ABDOMEN, AND PELVIS WITH CONTRAST INDICATION: Lung cancer. COMPARISON: PET/CT 11/08/2020, chest CT 10/22/2020 TECHNIQUE:  CT imaging was performed of the chest, abdomen, and pelvis after intravenous injection of 100 mL Isovue 370. Intravenous contrast was used for better evaluation of solid organs and vascular structures. Oral contrast was used for bowel opacification. Coronal reformatted imaging provided. Radiation dose reduction techniques were used for this study. Our CT scanners use one  or all of the following: Automated exposure control, adjustment of the mA and/or kV according to patient size, iterative reconstruction. FINDINGS: CHEST: Mediastinum and visualized thyroid: No lymphadenopathy. Heart: Multivessel coronary atherosclerotic calcifications. Large Vessels: Normal. Pleura: Normal. Lungs: Post surgical changes in the right lung. No recurrent tumor evident. Normal right lung hilum. Airways: Normal. ABDOMEN AND PELVIS: Motion limited evaluation of the abdomen Liver: Normal in size and morphology.  No focal lesions. Gallbladder/biliary: Normal gallbladder.  No biliary dilatation. Pancreas: Normal. Spleen: Normal. Adrenals: Stable  appearance of the left adrenal gland.. Kidneys: No focal lesion or hydronephrosis. Bladder: Normal. Pelvic organs: Normal uterus.  No adnexal mass. Gastrointestinal: Colonic diverticulosis.. Peritoneum/retroperitoneum: Normal. Lymph nodes: Normal. Vessels: Aortic atherosclerotic disease. Bones/Soft tissues: No aggressive appearing bone lesion.     Post surgical changes in the right lung without evidence of locally recurrent tumor or metastatic disease in the chest, abdomen, or pelvis.        ASSESSMENT:   Diagnosis Orders   1. Adenocarcinoma of right lung, stage 2 (HCC)                        PLAN:  Lab studies were personally reviewed.    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  PET/CT shows no distant disease.  Clinically T3N0M0.  She completed VATS with left lower lobectomy as well as chest wall mass resection  and lymphadenectomy on 05/07/20.  She tolerated this reasonably well, still with some post-op tenderness but nothing unmanageable.  Her tumor was 6.4 cm in greatest dimension with carcinoma in the specimen marked "chest wall" but with separation from tumor by connective tissue and lung parenchyma.  13 lymph node fragments were all negative for tumor.  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  We do also have the ALCHEMIST trial available, but unfortunately she is not a candidate due to her history of RA with MTX use.  She completed 4 cycles of cisplatin and Alimta with adequate tolerance.  CT chest at the conclusion of therapy reviewed and shows nothing of suspicion in the lung.  However, she does have a 1.4 cm adrenal nodule which was not obviously present on pretherapy imaging.  We recommended PET/CT to assess but this shows no significant uptake in the left  adrenal or other areas of concern.  We will continue observation with repeat CT in 3 months.  CT reviewed February 2024 and shows no evidence of recurrent disease.  She can now change to annual follow-up.        Assessment & Plan  Non small cell lung cancer.  She has no symptoms suspicious for recurrent NSCLC. Her recent CT scan, conducted a few weeks prior, did not reveal any alarming findings. The aneurysm remains stable and unchanged from previous evaluations. Annual scans will be scheduled for the next two years, with a final scan planned for 2027. Routine surveillance will be discontinued if these scans remain clear at the 5 year mark.    Follow-up  The patient will follow up in 1 year.               Tori Milks, MD  [facility]  [address]  [place], [place] [address]  Office : [phone]  Fax : [phone]

## 2023-09-09 NOTE — Patient Instructions (Signed)
 Patient Information from Today's Visit    The members of your [facility] are listed below:    Physician Provider: Charlton Amor, Medical Oncologist  Advanced Practice Clinician: Burr Medico, NP  Registered Nurse: Bryna Colander., RN  Navigator:   Medical Assistant: Jilda Panda., MA  Scheduler: Iran Planas.   Supportive Care Services: Harvie Bridge., LMSW    Diagnosis: adenocarcinoma lung      Follow Up Instructions:   Reviewed labs    Treatment Summary has been discussed and given to patient:      Current Labs:   Hospital Outpatient Visit on 09/09/2023   Component Date Value Ref Range Status    Sodium 09/09/2023 139  136 - 145 mmol/L Final    Potassium 09/09/2023 3.7  3.5 - 5.1 mmol/L Final    Chloride 09/09/2023 101  98 - 107 mmol/L Final    CO2 09/09/2023 25  20 - 29 mmol/L Final    Anion Gap 09/09/2023 13  7 - 16 mmol/L Final    Glucose 09/09/2023 105 (H)  70 - 99 mg/dL Final    Comment: <16 mg/dL Consistent with, but not fully diagnostic of hypoglycemia.  100 - 125 mg/dL Impaired fasting glucose/consistent with pre-diabetes mellitus.  > 126 mg/dl Fasting glucose consistent with overt diabetes mellitus      BUN 09/09/2023 24 (H)  8 - 23 MG/DL Final    Creatinine 10/96/0454 1.37 (H)  0.60 - 1.10 MG/DL Final    Est, Glom Filt Rate 09/09/2023 38 (L)  >60 ml/min/1.31m2 Final    Comment:    Pediatric calculator link: https://www.kidney.org/professionals/kdoqi/gfr_calculatorped     These results are not intended for use in patients <12 years of age.     eGFR results are calculated without a race factor using  the 2021 CKD-EPI equation. Careful clinical correlation is recommended, particularly when comparing to results calculated using previous equations.  The CKD-EPI equation is less accurate in patients with extremes of muscle mass, extra-renal metabolism of creatinine, excessive creatine ingestion, or following therapy that affects renal tubular secretion.      Calcium 09/09/2023 9.9  8.8 - 10.2 MG/DL Final    Total  Bilirubin 09/09/2023 1.2  0.0 - 1.2 MG/DL Final    ALT 09/81/1914 18  8 - 45 U/L Final    AST 09/09/2023 24  15 - 37 U/L Final    Alk Phosphatase 09/09/2023 45  35 - 104 U/L Final    Total Protein 09/09/2023 7.5  6.3 - 8.2 g/dL Final    Albumin 78/29/5621 4.0  3.2 - 4.6 g/dL Final    Globulin 30/86/5784 3.6 (H)  2.3 - 3.5 g/dL Final    Albumin/Globulin Ratio 09/09/2023 1.1  1.0 - 1.9   Final    WBC 09/09/2023 7.1  4.3 - 11.1 K/uL Final    RBC 09/09/2023 4.15  4.05 - 5.2 M/uL Final    Hemoglobin 09/09/2023 13.9  11.7 - 15.4 g/dL Final    Hematocrit 69/62/9528 42.0  35.8 - 46.3 % Final    MCV 09/09/2023 101.2  82.0 - 102.0 FL Final    MCH 09/09/2023 33.5 (H)  26.1 - 32.9 PG Final    MCHC 09/09/2023 33.1  31.4 - 35.0 g/dL Final    RDW 41/32/4401 13.8  11.9 - 14.6 % Final    Platelets 09/09/2023 230  150 - 450 K/uL Final    MPV 09/09/2023 9.9  9.4 - 12.3 FL Final    nRBC 09/09/2023 0.00  0.0 -  0.2 K/uL Final    **Note: Absolute NRBC parameter is now reported with Hemogram**    Neutrophils % 09/09/2023 55.4  43.0 - 78.0 % Final    Lymphocytes % 09/09/2023 30.9  13.0 - 44.0 % Final    Monocytes % 09/09/2023 10.8  4.0 - 12.0 % Final    Eosinophils % 09/09/2023 2.2  0.5 - 7.8 % Final    Basophils % 09/09/2023 0.6  0.0 - 2.0 % Final    Immature Granulocytes % 09/09/2023 0.1  0.0 - 5.0 % Final    Neutrophils Absolute 09/09/2023 3.94  1.70 - 8.20 K/UL Final    Lymphocytes Absolute 09/09/2023 2.20  0.50 - 4.60 K/UL Final    Monocytes Absolute 09/09/2023 0.77  0.10 - 1.30 K/UL Final    Eosinophils Absolute 09/09/2023 0.16  0.00 - 0.80 K/UL Final    Basophils Absolute 09/09/2023 0.04  0.00 - 0.20 K/UL Final    Immature Granulocytes Absolute 09/09/2023 0.01  0.00 - 0.50 K/UL Final    Differential Type 09/09/2023 AUTOMATED    Final                 Please refer to After Visit Summary or MyChart for upcoming appointment information. Please call our office for rescheduling needs at least 24 hours before your scheduled appointment  time.If you have any questions regarding your upcoming schedule please reach out to your care team through MyChart or call [phone].     Please notify your assigned Nurse Navigator of any unplanned hospital admissions or Emergency Room visits within 24 hours of discharge.    -------------------------------------------------------------------------------------------------------------------  Please call our office at [phone] if you have any  of the following symptoms:   Fever of 100.5 or greater  Chills  Shortness of breath  Swelling or pain in one leg    After office hours an answering service is available and will contact a provider for emergencies or if you are experiencing any of the above symptoms.  Russella Dar, RN

## 2023-10-09 ENCOUNTER — Ambulatory Visit
Admit: 2023-10-09 | Discharge: 2023-10-09 | Payer: MEDICARE | Attending: Physician Assistant | Primary: Internal Medicine

## 2023-10-09 DIAGNOSIS — M19011 Primary osteoarthritis, right shoulder: Secondary | ICD-10-CM

## 2023-10-09 MED ORDER — METHYLPREDNISOLONE ACETATE 40 MG/ML IJ SUSP
40 | Freq: Once | INTRAMUSCULAR | Status: AC
Start: 2023-10-09 — End: 2023-10-09
  Administered 2023-10-09: 14:00:00 80 mg via INTRA_ARTICULAR

## 2023-10-09 NOTE — Progress Notes (Signed)
 Name: Audrey Snow  Date of Birth: [birth year]  Gender: female  MRN: [id number]    CC:   Chief Complaint   Patient presents with    Injections     R Shoulder CSI         HPI: Patient presents for follow-up of right shoulder pain.  Patient has had right shoulder injection on 07-09-23.  Patient presents today requesting repeat.  No new injuries comparison to previous visit.      Allergies   Allergen Reactions    Yeast Nausea And Vomiting     Pt. Says she hasnt had issues with this in a long time    Yeast-Derived Drug Products Nausea And Vomiting and Headaches     Pt STATED NOT ALLERGIC     Past Medical History:   Diagnosis Date    Arrhythmia 04/05/2020    PVCs.  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    adenocarcinoma    Chronic obstructive pulmonary disease (HCC)     uses inhalers    Chronic pain of left knee 06/25/2018    Coronary atherosclerosis     noted on chest CT    COVID-19 vaccine series completed 08/15/2019    Pfizer; 07/29/2019 and booster given 03/12/2020    GERD (gastroesophageal reflux disease)     no meds at this time    H/O cardiovascular stress test     Bruce protocol, low risk study, EF of 67% and normal LV function    H/O mammogram 07/28/2016    mammogram and Ultrasound of right breaset- benign finding- [facility]     Hearing loss     "only hears out of left ear"    Hearing loss of both ears     no hearing aids     HTN (hypertension), benign 01/30/2014    controlled w/med    Hypothyroid     on med for control    Menopause     Osteoarthritis     Osteoporosis     prolia     Poor historian     patient had trouble remembering names of meds and what they were for and had to repeat instructions for surgery several times     Rheumatoid arthritis(714.0) 01/30/2014    followed by Dr.  Odis Luster. 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 Carmela Hurt    Unspecified hypothyroidism 01/30/2014    thyroid medication    Vitamin D deficiency     medications for this      Past Surgical History:   Procedure Laterality Date    CARPAL TUNNEL RELEASE Bilateral     COLONOSCOPY      colon polyps-adenomatous, has seen Dr. Leron Croak in past    COLONOSCOPY N/A 01/27/2018    COLONOSCOPY/BMI 26 performed by Adalberto Ill, MD at [facility]    COLONOSCOPY FLX DX W/COLLJ [facility] WHEN PFRMD  01/27/2018         CT NEEDLE BIOPSY LUNG PERCUTANEOUS W IMAGING GUIDANCE  04/16/2020    CT NEEDLE BIOPSY LUNG PERCUTANEOUS 04/16/2020 [facility] CT SCAN    HEMORRHOID SURGERY  OTHER SURGICAL HISTORY  09/2012    sinus surgery-Dr. Boris Lown    OTHER SURGICAL HISTORY  01/27/2013    Thymoma-Dr. Carmela Hurt    TONSILLECTOMY  83 yrs old    UPPER GASTROINTESTINAL ENDOSCOPY  2015    Gastric AVM txed with cautery-Dr. Leilani Able     Family History   Problem Relation Age of Onset    Osteoarthritis Mother     Breast Cancer Neg Hx     Alzheimer's Disease Father      Social History     Socioeconomic History    Marital status: Widowed     Spouse name: Not on file    Number of children: Not on file    Years of education: Not on file    Highest education level: Not on file   Occupational History    Not on file   Tobacco Use    Smoking status: Former     Current packs/day: 0.00     Average packs/day: 1 pack/day for 24.0 years (24.0 ttl pk-yrs)     Types: Cigarettes     Start date: 07/08/1959     Quit date: 07/08/1983     Years since quitting: 40.2    Smokeless tobacco: Never    Tobacco comments:     Quit smoking: not regular when first started- 1pk/week   Substance and Sexual Activity    Alcohol use: No     Alcohol/week: 0.0 standard drinks of alcohol    Drug use: No    Sexual activity: Not Currently     Partners: Male   Other Topics Concern    Not on file   Social History Narrative    Widowed since 06/29/21.  Works part time at [employer] in [employer].     Social Drivers of Health     Financial Resource Strain: Patient Declined (12/13/2022)    Overall Financial Resource Strain (CARDIA)     Difficulty of Paying Living Expenses: Patient declined   Food Insecurity: Patient Declined (12/13/2022)    Hunger Vital Sign     Worried About Running Out of Food in the Last Year: Patient declined     Ran Out of Food in the Last Year: Patient declined   Transportation Needs: Unknown (12/13/2022)    PRAPARE - Therapist, art (Medical): Not on file     Lack of Transportation (Non-Medical): No   Physical Activity: Not on file (09/23/2019)   Stress: Not on file   Social Connections: Unknown (09/23/2019)    Received from [facility]    Social Connections     Frequency of Communication with Friends and Family: Not asked     Frequency of Social Gatherings with Friends and Family: Not asked   Intimate Partner Violence: Unknown (09/23/2019)    Received from [facility], [facility]    Intimate Partner Violence     Fear of Current or Ex-Partner: Not asked     Emotionally Abused: Not asked     Physically Abused: Not asked     Sexually Abused: Not asked   Housing Stability: Unknown (12/13/2022)    Housing Stability Vital Sign     Unable to Pay for Housing in the Last Year: Not on file     Number of Places Lived in the Last Year: Not on file     Unstable Housing in the Last Year: No               No  data to display                Review of Systems  Non-contributory    PE:    No major changes in comparison to previous visit    Xray: Not indicated    A/Plan:     ICD-10-CM    1. Arthritis of right shoulder region  M19.011 DRAIN/INJECT LARGE JOINT/BURSA     methylPREDNISolone acetate (DEPO-MEDROL) injection 80 mg             Procedure note: After discussion of risks and benefits including, but not limited to pain, infection, steroid flare, increased blood sugar, fat necrosis, skin discoloration, and injury to blood vessels  or nerves, the patient verbally consented to proceed with a glenohumeral joint injection.  They understand that we are using this is an alternative method of treatment and the decision to not proceed with elective major surgery.  We have discussed this decision in detail.  The affected right shoulder was sterilely prepped in standard fashion and injected with 2 cc of depo medrol(40mg /ml), 2 cc of 1% Lidocaine, and 2 cc of 0.5% Marcaine into the joint space.  The patient tolerated the injection well.      No follow-ups on file.        [place], [place]  10/09/23

## 2023-10-28 MED ORDER — LEVOTHYROXINE SODIUM 125 MCG PO TABS
125 | ORAL_TABLET | Freq: Every day | ORAL | 1 refills | 90.00000 days | Status: DC
Start: 2023-10-28 — End: 2024-02-08

## 2023-10-30 MED ORDER — LOSARTAN POTASSIUM 100 MG PO TABS
100 | ORAL_TABLET | Freq: Every day | ORAL | 1 refills | 90.00000 days | Status: DC
Start: 2023-10-30 — End: 2023-12-09

## 2023-12-09 MED ORDER — LOSARTAN POTASSIUM 100 MG PO TABS
100 | ORAL_TABLET | Freq: Every day | ORAL | 1 refills | 90.00000 days | Status: DC
Start: 2023-12-09 — End: 2024-06-15

## 2023-12-09 MED ORDER — METOPROLOL SUCCINATE ER 50 MG PO TB24
50 | ORAL_TABLET | Freq: Every day | ORAL | 0 refills | 60.00000 days | Status: DC
Start: 2023-12-09 — End: 2024-02-09

## 2023-12-23 ENCOUNTER — Ambulatory Visit: Admit: 2023-12-23 | Discharge: 2023-12-23 | Payer: MEDICARE | Attending: Internal Medicine | Primary: Internal Medicine

## 2023-12-23 ENCOUNTER — Encounter

## 2023-12-23 VITALS — BP 128/70 | Ht 67.0 in | Wt 137.2 lb

## 2023-12-23 DIAGNOSIS — Z0000 Encounter for general adult medical examination without abnormal findings: Secondary | ICD-10-CM

## 2023-12-23 NOTE — Progress Notes (Signed)
 Medicare Annual Wellness Visit    Audrey Snow is here for Follow-up (Bump on right side of head. )    Assessment & Plan   Medicare annual wellness visit, subsequent  Rheumatoid arthritis involving multiple sites with positive rheumatoid factor (HCC)  -     CBC with Auto Differential; Future  -     Comprehensive Metabolic Panel; Future  Stage 3b chronic kidney disease (HCC)  -     Comprehensive Metabolic Panel; Future  Primary hypertension  -     CBC with Auto Differential; Future  -     Comprehensive Metabolic Panel; Future  Acquired hypothyroidism  -     TSH; Future  Mixed hyperlipidemia  -     Lipid Panel; Future  Elevated blood sugar  -     Hemoglobin A1C; Future       No follow-ups on file.     Subjective       Patient's complete Health Risk Assessment and screening values have been reviewed and are found in Flowsheets. The following problems were reviewed today and where indicated follow up appointments were made and/or referrals ordered.    Positive Risk Factor Screenings with Interventions:              Inactivity:  On average, how many days per week do you engage in moderate to strenuous exercise (like a brisk walk)?: 0 days (!) Abnormal  On average, how many minutes do you engage in exercise at this level?: 0 min    Interventions:  Patient declined any further interventions or treatment      Dentist Screen:  Have you seen the dentist within the past year?: (!) No    Intervention:  Patient declines any further evaluation or treatment    Hearing Screen:  Do you or your family notice any trouble with your hearing that hasn't been managed with hearing aids?: (!) Yes    Interventions:  Cannot hear even with aides     Safety:  Do you have non-slip mats or non-slip surfaces or shower bars or grab bars in your shower or bathtub?: (!) No    Interventions:  Get bathroom updated     Advanced Directives:  Do you have a Living Will?: (!) No    Intervention:  has NO advanced directive - not interested in additional  information                     Objective   Vitals:    12/23/23 1418 12/23/23 1445   BP:  128/70   Weight: 62.2 kg (137 lb 3.2 oz)    Height: 1.702 m (5' 7)       Body mass index is 21.49 kg/m.                    Allergies   Allergen Reactions    Yeast Nausea And Vomiting     Pt. Says she hasnt had issues with this in a long time    Yeast-Derived Drug Products Nausea And Vomiting and Headaches     Pt STATED NOT ALLERGIC     Prior to Visit Medications    Medication Sig Taking? Authorizing Provider   methotrexate (RHEUMATREX) 2.5 MG chemo tablet Take 4 tablets by mouth once a week Yes [provider]   losartan  (COZAAR ) 100 MG tablet Take 1 tablet by mouth daily TAKE 1 TABLET BY MOUTH DAILY Yes Chigozie Basaldua J, MD   metoprolol  succinate (  TOPROL  XL) 50 MG extended release tablet Take 1 tablet by mouth daily Yes Niley Helbig J, MD   levothyroxine  (SYNTHROID ) 125 MCG tablet Take 1 tablet by mouth Daily Yes Abria Vannostrand J, MD   albuterol  sulfate HFA (PROVENTIL ;VENTOLIN ;PROAIR ) 108 (90 Base) MCG/ACT inhaler Inhale 1 puff into the lungs every 4 hours as needed for Shortness of Breath or Wheezing Yes Derick Fleeting, Cindy M, APRN - CNP   atorvastatin  (LIPITOR) 20 MG tablet Take 1 tablet by mouth daily Yes Zyan Coby J, MD   folic acid  (FOLVITE ) 1 MG tablet Take 1 tablet by mouth daily Yes [provider]   Cholecalciferol 100 MCG (4000 UT) TABS Take 1 tablet by mouth daily Yes [provider]       CareTeam (Including outside providers/suppliers regularly involved in providing care):   Patient Care Team:  Gordy Lauber, MD as PCP - General  Berle Breeding, Elton Ham, MD as PCP - Empaneled Provider  Alva Jewels, MD as Physician  Voncille Guadalajara, RN as Nurse Navigator (Oncology)     Recommendations for Preventive Services Due: see orders and patient instructions/AVS.  Recommended screening schedule for the next 5-10 years is provided to the patient in written form: see  Patient Instructions/AVS.     Reviewed and updated this visit:  Tobacco  Allergies  Meds  Problems  Med Hx  Surg Hx  Fam Hx  Sexual   Hx

## 2023-12-23 NOTE — Progress Notes (Signed)
 HPI: Audrey Snow (DOB: May 19, 1941)    MW    Will update labs today    Pt very HOH and is still driving but is monitored by tracker on car      Problem List:  Patient Active Problem List   Diagnosis    Rheumatoid arthritis involving multiple sites with positive rheumatoid factor (HCC)    Dry skin dermatitis    Hyperlipidemia    Chronic right shoulder pain    Pre-ulcerative corn or callous    Mass of lower lobe of right lung    Vitamin D deficiency    Environmental allergies    HTN (hypertension)    Bilateral leg cramps    Gastroesophageal reflux disease without esophagitis    Large cell carcinoma of lung, right (HCC)    Adenocarcinoma of right lung, stage 2 (HCC)    Bronchogenic cancer of right lung (HCC)    Chronic pain of left knee    Elevated blood sugar    Chronic obstructive pulmonary disease (HCC)    Hypothyroidism    Stage 3b chronic kidney disease (HCC)    Short-term memory loss    Chronic otitis externa of right ear    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    Chronic obstructive pulmonary disease (HCC)     uses inhalers    Chronic pain of left knee 06/25/2018    Coronary atherosclerosis     noted on chest CT    COVID-19 vaccine series completed 08/15/2019    Pfizer; 07/29/2019 and booster given 03/12/2020    GERD (gastroesophageal reflux disease)     no meds at this time    H/O cardiovascular stress test     Bruce protocol, low risk study, EF of 67% and normal LV function    H/O mammogram  07/28/2016    mammogram and Ultrasound of right breaset- benign finding- [facility]     Hearing loss     only hears out of left ear    Hearing loss of both ears     no hearing aids     HTN (hypertension), benign 01/30/2014    controlled w/med    Hypothyroid     on med for control    Menopause     Osteoarthritis     Osteoporosis     prolia     Poor historian     patient had trouble remembering names of meds and what they were for and had to repeat instructions for surgery several times     Rheumatoid arthritis(714.0) 01/30/2014    followed by Dr. Vincente Green. 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 Dion Frankel    Unspecified hypothyroidism 01/30/2014    thyroid medication    Vitamin D deficiency     medications for this  Allergies:  Allergies   Allergen Reactions    Yeast Nausea And Vomiting     Pt. Says she hasnt had issues with this in a long time    Yeast-Derived Drug Products Nausea And Vomiting and Headaches     Pt STATED NOT ALLERGIC       Current Medications:  Current Outpatient Medications   Medication Sig Dispense Refill    methotrexate (RHEUMATREX) 2.5 MG chemo tablet Take 4 tablets by mouth once a week      losartan  (COZAAR ) 100 MG tablet Take 1 tablet by mouth daily TAKE 1 TABLET BY MOUTH DAILY 90 tablet 1    metoprolol  succinate (TOPROL  XL) 50 MG extended release tablet Take 1 tablet by mouth daily 90 tablet 0    levothyroxine  (SYNTHROID ) 125 MCG tablet Take 1 tablet by mouth Daily 90 tablet 1    albuterol  sulfate HFA (PROVENTIL ;VENTOLIN ;PROAIR ) 108 (90 Base) MCG/ACT inhaler Inhale 1 puff into the lungs every 4 hours as needed for Shortness of Breath or Wheezing 18 g 11    atorvastatin  (LIPITOR) 20 MG tablet Take 1 tablet by mouth daily 90 tablet 2    folic acid  (FOLVITE ) 1 MG tablet Take 1 tablet by mouth daily      Cholecalciferol 100 MCG (4000 UT) TABS Take 1 tablet by mouth daily       No current facility-administered medications for this visit.        Review of Systems:  Review of Systems   Constitutional:  Negative for unexpected weight change.   HENT:  Positive for hearing loss and rhinorrhea.    Respiratory:  Negative for shortness of breath.    Cardiovascular:  Negative for chest pain.   Skin:         Lesion in head   All other systems reviewed and are negative.    Vitals:  Ht 1.702 m (5' 7)   Wt 62.2 kg (137 lb 3.2 oz)   BMI 21.49 kg/m     Physical Exam:  Physical Exam  Vitals reviewed.   Constitutional:       Appearance: Normal appearance. She is normal weight.   HENT:      Head: Normocephalic and atraumatic.   Eyes:      Extraocular Movements: Extraocular movements intact.      Pupils: Pupils are equal, round, and reactive to light.   Cardiovascular:      Rate and Rhythm: Normal rate and regular rhythm.      Heart sounds: Normal heart sounds.   Pulmonary:      Effort: Pulmonary effort is normal.      Breath sounds: Normal breath sounds. No wheezing.   Musculoskeletal:         General: Normal range of motion.      Cervical back: Normal range of motion and neck supple.   Skin:     General: Skin is warm and dry.      Findings: Lesion present.      Comments: Scab in scalp on right side but no swelling or discharge  Lipoma right upper arm   Neurological:      General: No focal deficit present.      Mental Status: She is alert and oriented to person, place, and time.   Psychiatric:         Mood and Affect: Mood normal.         Behavior: Behavior normal.         Thought Content: Thought content  normal.         Judgment: Judgment normal.        Assessment/Plan:   Dorrine Gaudy was seen today for follow-up.    Diagnoses and all orders for this visit:    Medicare annual wellness visit, subsequent    Rheumatoid arthritis involving multiple sites with positive rheumatoid factor (HCC)  -     CBC with Auto Differential; Future  -     Comprehensive Metabolic Panel; Future    Stage 3b chronic kidney disease (HCC)  -     Comprehensive Metabolic Panel;  Future    Primary hypertension  -     CBC with Auto Differential; Future  -     Comprehensive Metabolic Panel; Future    Acquired hypothyroidism  -     TSH; Future    Mixed hyperlipidemia  -     Lipid Panel; Future    Elevated blood sugar  -     Hemoglobin A1C; Future    Chronic rhinitis- chronic nasal discharge- keeps Kleenex on hand    Had a skin lesion in head that has improved on left side- use Neosporin oint prn  Has had skin lesions in the past esp due to being on immunosupressants for RA    Labs today    Reassured again about the lipoma and due to location leave it alone    Continue to watch diet and monitor blood sugar    BP controlled      Update thyroid            Current medications are therapeutic at this time; continue as prescribed.        Tiago Humphrey J Naarah Borgerding, MD

## 2023-12-24 LAB — LIPID PANEL
Chol/HDL Ratio: 3.7 (ref 0.0–5.0)
Cholesterol, Total: 217 mg/dL — ABNORMAL HIGH (ref 0–200)
HDL: 59 mg/dL (ref 40–60)
LDL Cholesterol: 140 mg/dL — ABNORMAL HIGH (ref 0–100)
Triglycerides: 88 mg/dL (ref 0–150)
VLDL Cholesterol Calculated: 18 mg/dL (ref 6–23)

## 2023-12-24 LAB — CBC WITH AUTO DIFFERENTIAL
Basophils %: 0.4 % (ref 0.0–2.0)
Basophils Absolute: 0.03 10*3/uL (ref 0.00–0.20)
Eosinophils %: 1.9 % (ref 0.5–7.8)
Eosinophils Absolute: 0.13 10*3/uL (ref 0.00–0.80)
Hematocrit: 40.8 % (ref 35.8–46.3)
Hemoglobin: 14 g/dL (ref 11.7–15.4)
Immature Granulocytes %: 0.3 % (ref 0.0–5.0)
Immature Granulocytes Absolute: 0.02 10*3/uL (ref 0.0–0.5)
Lymphocytes %: 27.4 % (ref 13.0–44.0)
Lymphocytes Absolute: 1.85 10*3/uL (ref 0.50–4.60)
MCH: 33.7 pg — ABNORMAL HIGH (ref 26.1–32.9)
MCHC: 34.3 g/dL (ref 31.4–35.0)
MCV: 98.3 FL (ref 82–102)
MPV: 10.5 FL (ref 9.4–12.3)
Monocytes %: 8.3 % (ref 4.0–12.0)
Monocytes Absolute: 0.56 10*3/uL (ref 0.10–1.30)
Neutrophils %: 61.7 % (ref 43.0–78.0)
Neutrophils Absolute: 4.17 10*3/uL (ref 1.70–8.20)
Platelets: 212 10*3/uL (ref 150–450)
RBC: 4.15 M/uL (ref 4.05–5.2)
RDW: 13.3 % (ref 11.9–14.6)
WBC: 6.8 10*3/uL (ref 4.3–11.1)
nRBC: 0 10*3/uL (ref 0.0–0.2)

## 2023-12-24 LAB — COMPREHENSIVE METABOLIC PANEL
ALT: 15 U/L (ref 8–45)
AST: 26 U/L (ref 15–37)
Albumin/Globulin Ratio: 1.1 (ref 1.0–1.9)
Albumin: 3.8 g/dL (ref 3.2–4.6)
Alk Phosphatase: 45 U/L (ref 35–104)
Anion Gap: 12 mmol/L (ref 7–16)
BUN: 21 mg/dL (ref 8–23)
CO2: 26 mmol/L (ref 20–29)
Calcium: 9.9 mg/dL (ref 8.8–10.2)
Chloride: 100 mmol/L (ref 98–107)
Creatinine: 1.5 mg/dL — ABNORMAL HIGH (ref 0.60–1.10)
Est, Glom Filt Rate: 34 mL/min/{1.73_m2} — ABNORMAL LOW (ref >60)
Globulin: 3.6 g/dL — ABNORMAL HIGH (ref 2.3–3.5)
Glucose: 108 mg/dL — ABNORMAL HIGH (ref 70–99)
Potassium: 4.1 mmol/L (ref 3.5–5.1)
Sodium: 138 mmol/L (ref 136–145)
Total Bilirubin: 1.2 mg/dL (ref 0.0–1.2)
Total Protein: 7.4 g/dL (ref 6.3–8.2)

## 2023-12-24 LAB — HEMOGLOBIN A1C
Estimated Avg Glucose: 127 mg/dL
Hemoglobin A1C: 6 % — ABNORMAL HIGH (ref 0–5.6)

## 2023-12-24 LAB — TSH: TSH, 3rd Generation: 50.5 u[IU]/mL — ABNORMAL HIGH (ref 0.270–4.200)

## 2024-01-12 ENCOUNTER — Encounter: Payer: MEDICARE | Attending: Orthopaedic Surgery | Primary: Internal Medicine

## 2024-01-12 DIAGNOSIS — M19011 Primary osteoarthritis, right shoulder: Principal | ICD-10-CM

## 2024-01-12 NOTE — Progress Notes (Unsigned)
 Name: Audrey Snow  Date of Birth: 03/21/1941  Gender: female  MRN: [id number]    CC: No chief complaint on file.       HPI: Patient presents for follow-up of right shoulder pain.  Patient has had right shoulder injection on ***.  Patient presents today requesting repeat.  No new injuries comparison to previous visit.***    Allergies   Allergen Reactions    Yeast Nausea And Vomiting     Pt. Says she hasnt had issues with this in a long time    Yeast-Derived Drug Products Nausea And Vomiting and Headaches     Pt STATED NOT ALLERGIC     Past Medical History:   Diagnosis Date    Arrhythmia 04/05/2020    PVCs.  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    Chronic obstructive pulmonary disease (HCC)     uses inhalers    Chronic pain of left knee 06/25/2018    Coronary atherosclerosis     noted on chest CT    COVID-19 vaccine series completed 08/15/2019    Pfizer; 07/29/2019 and booster given 03/12/2020    GERD (gastroesophageal reflux disease)     no meds at this time    H/O cardiovascular stress test     Bruce protocol, low risk study, EF of 67% and normal LV function    H/O mammogram 07/28/2016    mammogram and Ultrasound of right breaset- benign finding- [facility]     Hearing loss     only hears out of left ear    Hearing loss of both ears     no hearing aids     HTN (hypertension), benign 01/30/2014    controlled w/med    Hypothyroid     on med for control    Menopause     Osteoarthritis     Osteoporosis     prolia     Poor historian     patient had trouble remembering names of meds and what they were for and had to repeat instructions for surgery several times     Rheumatoid arthritis(714.0) 01/30/2014    followed by Dr. Leonia. 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 Mateo in past    COLONOSCOPY N/A 01/27/2018    COLONOSCOPY/BMI 26 performed by Bertrum Toribio HERO, MD at [facility] ENDOSCOPY    COLONOSCOPY FLX DX W/COLLJ SPEC WHEN PFRMD  01/27/2018         CT NEEDLE BIOPSY LUNG PERCUTANEOUS W IMAGING GUIDANCE  04/16/2020    CT NEEDLE BIOPSY LUNG PERCUTANEOUS 04/16/2020 [facility] CT SCAN    HEMORRHOID SURGERY      OTHER SURGICAL HISTORY  09/2012    sinus surgery-Dr. Kennedy    OTHER SURGICAL HISTORY  01/27/2013    Thymoma-Dr. Osa    TONSILLECTOMY  83 yrs old    UPPER GASTROINTESTINAL ENDOSCOPY  2015    Gastric AVM txed with cautery-Dr. Delmus     Family History   Problem Relation Age of Onset    Osteoarthritis Mother     Breast Cancer Neg Hx     Alzheimer's Disease Father      Social History     Socioeconomic History    Marital status: Widowed     Spouse name: Not on file    Number of children: Not on file    Years of education: Not on file    Highest education level: Not on file   Occupational History    Not on file   Tobacco Use    Smoking status: Former     Current packs/day: 0.00     Average packs/day: 1 pack/day for 24.0 years (24.0 ttl pk-yrs)     Types: Cigarettes     Start date: 07/08/1959     Quit date: 07/08/1983     Years since quitting: 40.5    Smokeless tobacco: Never    Tobacco comments:     Quit smoking: not regular when first started- 1pk/week   Substance and Sexual Activity    Alcohol use: No     Alcohol/week: 0.0 standard drinks of alcohol    Drug use: No    Sexual activity: Not Currently     Partners: Male   Other Topics Concern    Not on file   Social History Narrative    Widowed since 06/29/21. Works part time at [employer] in [employer].     Social  Drivers of Health     Financial Resource Strain: Patient Declined (12/13/2022)    Overall Financial Resource Strain (CARDIA)     Difficulty of Paying Living Expenses: Patient declined   Food Insecurity: No Food Insecurity (12/23/2023)    Hunger Vital Sign     Worried About Running Out of Food in the Last Year: Never true     Ran Out of Food in the Last Year: Never true   Transportation Needs: No Transportation Needs (12/23/2023)    PRAPARE - Therapist, art (Medical): No     Lack of Transportation (Non-Medical): No   Physical Activity: Inactive (12/23/2023)    Exercise Vital Sign     Days of Exercise per Week: 0 days     Minutes of Exercise per Session: 0 min   Stress: Not on file   Social Connections: Unknown (09/23/2019)    Received from [facility], [facility]    Social Connections     Frequency of Communication with Friends and Family: Not asked     Frequency of Social Gatherings with Friends and Family: Not asked   Intimate Partner Violence: Unknown (09/23/2019)    Received from [facility], [facility]    Intimate Partner Violence     Fear of Current or Ex-Partner: Not asked     Emotionally Abused: Not asked     Physically Abused: Not asked     Sexually Abused: Not asked   Housing Stability: Low Risk  (12/23/2023)    Housing Stability Vital Sign     Unable to Pay for Housing in the Last Year: No     Number of Times Moved in the Last Year: 0     Homeless in the Last Year: No  No data to display                Review of Systems  Non-contributory    PE:    No major changes in comparison to previous visit    Xray: Not indicated    A/Plan:     ICD-10-CM    1. Arthritis of right shoulder region  M19.011              Procedure note: After discussion of risks and benefits including, but not limited to pain, infection, steroid flare, increased blood sugar, fat necrosis, skin discoloration, and injury to blood vessels or nerves, the patient verbally consented to proceed with a  glenohumeral joint injection.  They understand that we are using this is an alternative method of treatment and the decision to not proceed with elective major surgery.  We have discussed this decision in detail.  The affected right shoulder was sterilely prepped in standard fashion and injected with 2 cc of depo medrol (40mg /ml), 2 cc of 1% Lidocaine, and 2 cc of 0.5% Marcaine into the joint space.  The patient tolerated the injection well.      No follow-ups on file.        BRUNO BODY, ATC  01/06/24

## 2024-02-08 MED ORDER — LEVOTHYROXINE SODIUM 125 MCG PO TABS
125 | ORAL_TABLET | Freq: Every day | ORAL | 1 refills | 90.00000 days | Status: AC
Start: 2024-02-08 — End: ?

## 2024-02-09 MED ORDER — METOPROLOL SUCCINATE ER 50 MG PO TB24
50 | ORAL_TABLET | Freq: Every day | ORAL | 1 refills | 60.00000 days | Status: AC
Start: 2024-02-09 — End: 2025-02-08

## 2024-02-09 MED ORDER — ATORVASTATIN CALCIUM 20 MG PO TABS
20 | ORAL_TABLET | Freq: Every day | ORAL | 1 refills | 90.00000 days | Status: AC
Start: 2024-02-09 — End: 2025-03-06

## 2024-06-15 NOTE — Telephone Encounter (Signed)
Needs OV for additional refills; please call to schedule.

## 2024-06-15 NOTE — Telephone Encounter (Signed)
"  Requested Prescriptions     Pending Prescriptions Disp Refills    losartan  (COZAAR ) 100 MG tablet 90 tablet 1     Sig: Take 1 tablet by mouth daily TAKE 1 TABLET BY MOUTH DAILY     Scheduled 10/06/24 to establish with Dr. Dillard  "

## 2024-06-16 MED ORDER — LOSARTAN POTASSIUM 100 MG PO TABS
100 | ORAL_TABLET | Freq: Every day | ORAL | 0 refills | Status: AC
Start: 2024-06-16 — End: ?

## 2024-06-16 NOTE — Telephone Encounter (Signed)
"  Spoke with patient's daughter. She will check calendar and call back to schedule appointment.   "

## 2024-06-28 ENCOUNTER — Emergency Department: Admit: 2024-06-28 | Payer: MEDICARE | Primary: Internal Medicine

## 2024-06-28 ENCOUNTER — Inpatient Hospital Stay
Admit: 2024-06-28 | Discharge: 2024-06-29 | Disposition: A | Discharge status: Still In Hospital | Payer: MEDICARE | Arrived: VH | Attending: Emergency Medicine

## 2024-06-28 DIAGNOSIS — Z87821 Personal history of retained foreign body fully removed: Principal | ICD-10-CM

## 2024-06-28 DIAGNOSIS — T189XXA Foreign body of alimentary tract, part unspecified, initial encounter: Secondary | ICD-10-CM

## 2024-06-28 LAB — BASIC METABOLIC PANEL
Anion Gap: 12 mmol/L (ref 7–16)
BUN: 17 mg/dL (ref 8–23)
CO2: 23 mmol/L (ref 20–29)
Calcium: 9.1 mg/dL (ref 8.8–10.2)
Chloride: 103 mmol/L (ref 98–107)
Creatinine: 1.21 mg/dL (ref 0.80–1.30)
Est, Glom Filt Rate: 44 ml/min/1.73m2 — ABNORMAL LOW (ref >60)
Glucose: 104 mg/dL — ABNORMAL HIGH (ref 70–99)
Potassium: 4.1 mmol/L (ref 3.5–5.1)
Sodium: 138 mmol/L (ref 133–143)

## 2024-06-28 LAB — CBC
Hematocrit: 41.5 % (ref 35.8–46.3)
Hemoglobin: 13.9 g/dL (ref 11.7–15.4)
MCH: 32.2 pg (ref 26.1–32.9)
MCHC: 33.5 g/dL (ref 31.4–35.0)
MCV: 96.1 FL (ref 82.0–102.0)
MPV: 9.8 FL (ref 8.8–12.5)
Platelets: 210 K/uL (ref 150–450)
RBC: 4.32 M/uL (ref 4.05–5.20)
RDW: 13.7 % (ref 11.9–14.6)
WBC: 6.8 K/uL (ref 4.3–11.1)
nRBC: 0 K/uL (ref 0.0–0.2)

## 2024-06-28 NOTE — ED Triage Notes (Addendum)
"  Pt arriving ambulatory to triage with daughter. Pt states she swallowed a toothpick. Pt unsure of how far the toothpick went. Pt states toothpick was wooden, states this happened an hour ago   "

## 2024-06-28 NOTE — ED Notes (Signed)
"  Spoke with Hailey, [employer], pt accepted to DT m/s by Dr. Celestia. She will call back with bed assigment.     "

## 2024-06-28 NOTE — ED Notes (Signed)
"  TRANSFER - OUT REPORT:    Verbal report given to Denia E. RN on Audrey Snow  being transferred to [facility] for routine progression of patient care       Report consisted of patient's Situation, Background, Assessment and   Recommendations(SBAR).     Information from the following report(s) ED SBAR, Recent Results, and Neuro Assessment was reviewed with the receiving nurse.    Lines:   Peripheral IV 06/28/24 Left Antecubital (Active)        Opportunity for questions and clarification was provided.      Patient transported with HER FAMILY.    "

## 2024-06-28 NOTE — Telephone Encounter (Signed)
"  Patient called and left VM stating that she swallowed a toothpick. Returned call to patient. Left a detailed message on personal VM advising if she should be ok unless develop any symptoms of abdominal pain or throat pain then recommend ER. Ask that she call back to office with any questions or concerns.   "

## 2024-06-28 NOTE — H&P (Addendum)
 "      Hospitalist History and Physical   Admit Date:  06/28/2024  8:39 PM   Name:  Audrey Snow   Age:  83 y.o.  Sex:  female  DOB:  02-10-1941   MRN:  [id number]   HAR:  [id number]  Room:  605/01    Presenting/Chief Complaint: No chief complaint on file.     Reason(s) for Admission: H/O swallowed foreign body [Z87.821]     History of Present Illness:   Audrey Snow is a 83 y.o. female who is a transfer from [place] ER for observation for swallowed toothpick unintentionally. Patient with no abdominal discomfort and actually hungry. GI aware of patient. Toothpick was wooden   Assessment & Plan:     Principal Problem:    H/O swallowed foreign body  Plan: Since a small wooden toothpick, I suspect her gastric acid will be able to soften the wood. Placing as observation. Consult GI to see in AM to ensure no EGD needed. NPO past midnight and clears for now.     HLD - statin     Hypothyroidism - Synthroid     HTN - BB, ARB    Hx of lung cancer - Has annual screenings. Followed by Dr. Mertha.     Likely can dc on 12/24.       PT/OT evals ordered?  Not ordered; patient not expected to need rehab  Diet: Diet NPO  ADULT DIET; Clear Liquid  VTE prophylaxis: SCD's   Code status: Full Code  Code status discussed: Yes  Blood consent obtained: No      Non-peripheral Lines and Tubes (if present):             Hospital Problems:  Principal Problem:    H/O swallowed foreign body  Active Problems:    Short-term memory loss    Rheumatoid arthritis involving multiple sites with positive rheumatoid factor (HCC)    Hyperlipidemia    HTN (hypertension)    Large cell carcinoma of lung, right (HCC)    Hypothyroidism  Resolved Problems:    * No resolved hospital problems. *        Objective:   Patient Vitals for the past 24 hrs:   Temp Pulse Resp BP SpO2   06/28/24 2043 98.6 F (37 C) 66 18 124/80 98 %       Oxygen Therapy  SpO2: 98 %  O2 Device: None (Room air)    Estimated body mass index is 21.93 kg/m as calculated from the  following:    Height as of this encounter: 1.702 m (5' 7).    Weight as of an earlier encounter on 06/28/24: 63.5 kg (140 lb).  No intake or output data in the 24 hours ending 06/28/24 2131      Physical Exam:    General:    Well nourished.  Very talkative, smiling.   Head:  Normocephalic, atraumatic  Eyes:  Sclerae appear normal.  Pupils equally Snow.  ENT:  Nares appear normal.  Moist oral mucosa  Neck:  No restricted ROM.  Trachea midline   CV:   RRR.  Lungs:   CTAB.    Abdomen:   Soft, +BS  Extremities:  No edema.    Skin:     No rashes.  No mottling.  Warm and dry.    Neuro:  Hard of hearing but no confusion     Orders Placed This Encounter   Medications    sodium chloride  flush 0.9 %  injection 5-40 mL    sodium chloride  flush 0.9 % injection 5-40 mL    0.9 % sodium chloride  infusion    OR Linked Order Group     potassium chloride  (KLOR-CON  M) extended release tablet 20 mEq     potassium bicarb-citric acid  (EFFER-K) effervescent tablet 40 mEq     potassium chloride  10 mEq/100 mL IVPB (Peripheral Line)    magnesium  sulfate 1000 mg in dextrose 5% 100 mL IVPB    DISCONTD: ondansetron  (ZOFRAN -ODT) disintegrating tablet 4 mg    DISCONTD: ondansetron  (ZOFRAN ) injection 4 mg    melatonin tablet 3 mg    polyethylene glycol (GLYCOLAX ) packet 17 g    bisacodyl  (DULCOLAX) suppository 10 mg    famotidine  (PEPCID ) tablet 10 mg    aluminum & magnesium  hydroxide-simethicone (MAALOX PLUS) 200-200-20 MG/5ML suspension 30 mL    OR Linked Order Group     acetaminophen  (TYLENOL ) tablet 650 mg     acetaminophen  (TYLENOL ) suppository 650 mg    albuterol  (PROVENTIL ) (2.5 MG/3ML) 0.083% nebulizer solution 2.5 mg     Initiate RT Bronchodilator Protocol:   Yes - Inpatient Protocol    atorvastatin  (LIPITOR ) tablet 20 mg    folic acid  (FOLVITE ) tablet 1 mg    levothyroxine  (SYNTHROID ) tablet 125 mcg    losartan  (COZAAR ) tablet 100 mg    metoprolol  succinate (TOPROL  XL) extended release tablet 50 mg       Prior to Admit  Medications:  Current Outpatient Medications   Medication Instructions    albuterol  sulfate HFA (PROVENTIL ;VENTOLIN ;PROAIR ) 108 (90 Base) MCG/ACT inhaler 1 puff, Inhalation, EVERY 4 HOURS PRN    atorvastatin  (LIPITOR ) 20 mg, Oral, DAILY    Cholecalciferol 100 MCG (4000 UT) TABS 1 tablet, DAILY    folic acid  (FOLVITE ) 1 mg, DAILY    levothyroxine  (SYNTHROID ) 125 mcg, Oral, DAILY    losartan  (COZAAR ) 100 mg, Oral, DAILY, TAKE 1 TABLET BY MOUTH DAILY    metoprolol  succinate (TOPROL  XL) 50 mg, Oral, DAILY       I have personally reviewed labs and tests:  Recent Results (from the past 24 hours)   Basic Metabolic Panel    Collection Time: 06/28/24  4:55 PM   Result Value Ref Range    Sodium 138 133 - 143 mmol/L    Potassium 4.1 3.5 - 5.1 mmol/L    Chloride 103 98 - 107 mmol/L    CO2 23 20 - 29 mmol/L    Anion Gap 12 7 - 16 mmol/L    Glucose 104 (H) 70 - 99 mg/dL    BUN 17 8 - 23 MG/DL    Creatinine 8.78 9.19 - 1.30 MG/DL    Est, Glom Filt Rate 44 (L) >60 ml/min/1.57m2    Calcium  9.1 8.8 - 10.2 MG/DL   CBC    Collection Time: 06/28/24  4:55 PM   Result Value Ref Range    WBC 6.8 4.3 - 11.1 K/uL    RBC 4.32 4.05 - 5.20 M/uL    Hemoglobin 13.9 11.7 - 15.4 g/dL    Hematocrit 58.4 64.1 - 46.3 %    MCV 96.1 82.0 - 102.0 FL    MCH 32.2 26.1 - 32.9 PG    MCHC 33.5 31.4 - 35.0 g/dL    RDW 86.2 88.0 - 85.3 %    Platelets 210 150 - 450 K/uL    MPV 9.8 8.8 - 12.5 FL    nRBC 0.00 0.0 - 0.2 K/uL  No results for input(s): COVID19 in the last 72 hours.    CT CHEST ABDOMEN PELVIS WO CONTRAST Additional Contrast? None  Result Date: 06/28/2024  CT of the Chest, Abdomen, and Pelvis INDICATION: Patient swallowed a wooden toothpick TECHNIQUE: Multiple 2D axial images were obtained through the chest, abdomen, and pelvis without IV contrast. Oral contrast was not administered.  Radiation dose reduction techniques were used for this study.  All CT scans performed at this facility use one or all of the following: Automated exposure control,  adjustment of the mA and/or kVp according to patient's size, iterative reconstruction. COMPARISON: August 31, 2023 FINDINGS: - LUNGS: Diffuse bilateral interstitial scarring of the lungs. Biapical centrilobular emphysema. - MEDIASTINUM/AXILLA: No significant adenopathy. - HEART/VESSELS: Mild cardiomegaly. Severe coronary arterial calcifications. Ectatic 3.3 cm ascending thoracic aorta. - CHEST WALL: Normal. - LIVER: Normal in size and appearance.  - GALLBLADDER/BILE DUCTS: No gallstones or bile duct dilatation. - PANCREAS: Normal. - SPLEEN: Normal. - ADRENALS:  Normal. - KIDNEYS/URETERS: No hydronephrosis or significant mass. - BLADDER: Thick walled - REPRODUCTIVE ORGANS: No pelvic masses. - BOWEL: Extensive diverticulosis coli. - LYMPH NODES: No significant retroperitoneal, mesenteric, or pelvic adenopathy. - BONES: No fracture or significant bone lesion. - VASCULATURE: Mild aneurysmal dilatation of the infrarenal abdominal aorta at 3.2 cm in size. Demonstration of chronic aortic dissection as demonstrated by displaced mural calcifications. On previous CT from August 31, 2023 there is demonstration of a large amount of mural thrombus as well as the chronic appearing dissection - OTHER: No ascites.     1. Per history provided patient swallowed a wooden toothpick. Given its wooden nature, this is rather difficult to identify on this evaluation. Although it can't be rendered from this study is that there is no current evidence of obstruction. 2. 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 3. Ectatic ascending thoracic aorta. 4. Diverticulosis coli. 5. Thickened urinary bladder. Correlate. 6. Moderate degenerative changes of the thoracolumbar spine. Electronically signed by Robert Fox        Signed:  SUZEN BOHR, DO    Part of this note may have been written by using a voice dictation software.  The note has been proof read but may still contain some grammatical/other  typographical errors.   "

## 2024-06-28 NOTE — ED Provider Notes (Signed)
 "   Emergency Department Provider Note       PCP: Solon Jori PARAS, MD   Age: 83 y.o.   Sex: female     DISPOSITION Decision To Transfer 06/28/2024 07:08:26 PM   DISPOSITION CONDITION Stable            ICD-10-CM    1. Swallowed foreign body, initial encounter  T18.9XXA           Medical Decision Making     I will get a CT of her chest abdomen pelvis to look to see if we can find toothpick  ED Course as of 06/28/24 1910   Tue Jun 28, 2024   1733 No obvious findings on the CT.  I will discuss with GI [AC]      ED Course User Index  [AC] Gerome Beverley HERO, MD     I discussed the case with Dr. Oza from gastroenterology who at this time does not think endoscopy is warranted since we cannot see the foreign object on CT.  I also discussed the case with general surgery who does not think exploratory surgery is indicated.  In consultation with those services I will plan to admit the patient to the hospitalist for further observation    Shared medical decision making was utilized in creating the patients health plan today.    I independently ordered and reviewed each unique test.       I interpreted the CT Scan no foreign body.  No signs of obstruction.  No signs of perforation..    The patient was admitted and I have discussed patient management with the admitting provider.  The management of this patient was discussed with an external consultant.            History     83 year old lady presents with concerns about swallowing at wooden toothpick approximately 1 hour prior to arrival.  She says she cannot really feel if it is stuck anywhere.  Currently she denies any pain, nausea, vomiting, or difficulty breathing.    No other associated symptoms.    Elements of this note were created using speech recognition software.  As such, errors of speech recognition may be present.        ROS     Review of Systems   Constitutional:  Negative for chills and fever.   Respiratory:  Negative for cough and shortness of breath.     Gastrointestinal:  Negative for abdominal pain, nausea and vomiting.        Physical Exam     Vitals signs and nursing note reviewed:  Vitals:    06/28/24 1625   BP: 127/84   Pulse: 79   Resp: 16   Temp: 97.3 F (36.3 C)   TempSrc: Oral   SpO2: 97%   Weight: 63.5 kg (140 lb)   Height: 1.702 m (5' 7)      Physical Exam  Constitutional:       Appearance: Normal appearance.   Cardiovascular:      Rate and Rhythm: Normal rate and regular rhythm.   Pulmonary:      Effort: Pulmonary effort is normal.      Breath sounds: Normal breath sounds.   Abdominal:      General: Bowel sounds are normal.      Palpations: Abdomen is soft.   Neurological:      Mental Status: She is alert.        Procedures     Procedures  Orders Placed This Encounter   Procedures    CT CHEST ABDOMEN PELVIS WO CONTRAST Additional Contrast? None    Basic Metabolic Panel    CBC        Medications given during this emergency department visit:  Medications - No data to display    New Prescriptions    No medications on file        Past Medical History:   Diagnosis Date    Arrhythmia 04/05/2020    PVCs.  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    Chronic obstructive pulmonary disease (HCC)     uses inhalers    Chronic pain of left knee 06/25/2018    Coronary atherosclerosis     noted on chest CT    COVID-19 vaccine series completed 08/15/2019    Pfizer; 07/29/2019 and booster given 03/12/2020    GERD (gastroesophageal reflux disease)     no meds at this time    H/O cardiovascular stress test     Bruce protocol, low risk study, EF of 67% and normal LV function    H/O mammogram 07/28/2016    mammogram and Ultrasound of right breaset- benign finding- [facility]     Hearing loss     only hears out of left ear     Hearing loss of both ears     no hearing aids     HTN (hypertension), benign 01/30/2014    controlled w/med    Hypothyroid     on med for control    Menopause     Osteoarthritis     Osteoporosis     prolia     Poor historian     patient had trouble remembering names of meds and what they were for and had to repeat instructions for surgery several times     Rheumatoid arthritis(714.0) 01/30/2014    followed by Dr. Leonia. 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 Mateo in past    COLONOSCOPY N/A 01/27/2018    COLONOSCOPY/BMI 26 performed by Bertrum Toribio HERO, MD at [facility] ENDOSCOPY    COLONOSCOPY FLX DX W/COLLJ SPEC WHEN PFRMD  01/27/2018         CT NEEDLE BIOPSY LUNG PERCUTANEOUS W IMAGING GUIDANCE  04/16/2020    CT NEEDLE BIOPSY LUNG PERCUTANEOUS 04/16/2020 [facility] CT SCAN    HEMORRHOID SURGERY      OTHER SURGICAL HISTORY  09/2012    sinus surgery-Dr. Kennedy    OTHER SURGICAL HISTORY  01/27/2013    Thymoma-Dr. Osa    TONSILLECTOMY  83 yrs old    UPPER GASTROINTESTINAL ENDOSCOPY  2015    Gastric AVM txed with cautery-Dr. Delmus        Social History  Socioeconomic History    Marital status: Widowed   Tobacco Use    Smoking status: Former     Current packs/day: 0.00     Average packs/day: 1 pack/day for 24.0 years (24.0 ttl pk-yrs)     Types: Cigarettes     Start date: 07/08/1959     Quit date: 07/08/1983     Years since quitting: 41.0    Smokeless tobacco: Never    Tobacco comments:     Quit smoking: not regular when first started- 1pk/week   Substance and Sexual Activity    Alcohol use: Never    Drug use: No    Sexual activity: Not Currently     Partners: Male   Social History Narrative     Widowed since 06/29/21. 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     Social Drivers of Health     Financial Resource Strain: Patient Declined (12/13/2022)    Overall Financial Resource Strain (CARDIA)     Difficulty of Paying Living Expenses: Patient declined   Food Insecurity: No Food Insecurity (12/23/2023)    Hunger Vital Sign     Worried About Running Out of Food in the Last Year: Never true     Ran Out of Food in the Last Year: Never true   Transportation Needs: No Transportation Needs (12/23/2023)    PRAPARE - Therapist, Art (Medical): No     Lack of Transportation (Non-Medical): No   Physical Activity: Inactive (12/23/2023)    Exercise Vital Sign     Days of Exercise per Week: 0 days     Minutes of Exercise per Session: 0 min   Housing Stability: Low Risk  (12/23/2023)    Housing Stability Vital Sign     Unable to Pay for Housing in the Last Year: No     Number of Times Moved in the Last Year: 0     Homeless in the Last Year: No        Previous Medications    ALBUTEROL  SULFATE HFA (PROVENTIL ;VENTOLIN ;PROAIR ) 108 (90 BASE) MCG/ACT INHALER    Inhale 1 puff into the lungs every 4 hours as needed for Shortness of Breath or Wheezing    ATORVASTATIN  (LIPITOR ) 20 MG TABLET    Take 1 tablet by mouth daily    CHOLECALCIFEROL 100 MCG (4000 UT) TABS    Take 1 tablet by mouth daily    FOLIC ACID  (FOLVITE ) 1 MG TABLET    Take 1 tablet by mouth daily    LEVOTHYROXINE  (SYNTHROID ) 125 MCG TABLET    TAKE 1 TABLET BY MOUTH DAILY    LOSARTAN  (COZAAR ) 100 MG TABLET    Take 1 tablet by mouth daily TAKE 1 TABLET BY MOUTH DAILY    METOPROLOL  SUCCINATE (TOPROL  XL) 50 MG EXTENDED RELEASE TABLET    Take 1 tablet by mouth daily        Results for orders placed or performed during the hospital encounter of 06/28/24   CT CHEST ABDOMEN PELVIS WO CONTRAST Additional Contrast? None    Narrative    CT of the Chest, Abdomen, and Pelvis    INDICATION: Patient swallowed a wooden toothpick    TECHNIQUE:  Multiple 2D axial images  were obtained through the chest, abdomen, and pelvis  without IV contrast.  Oral contrast was not administered.    Radiation dose reduction techniques were used for this study.  All CT scans  performed at this facility use one or all  of the following: Automated exposure  control, adjustment of the mA and/or kVp according to patient's size, iterative  reconstruction.    COMPARISON: August 31, 2023    FINDINGS:  - LUNGS: Diffuse bilateral interstitial scarring of the lungs. Biapical  centrilobular emphysema.    - MEDIASTINUM/AXILLA: No significant adenopathy.    - HEART/VESSELS: Mild cardiomegaly. Severe coronary arterial calcifications.  Ectatic 3.3 cm ascending thoracic aorta.    - CHEST WALL: Normal.    - LIVER: Normal in size and appearance.      - GALLBLADDER/BILE DUCTS: No gallstones or bile duct dilatation.    - PANCREAS: Normal.    - SPLEEN: Normal.    - ADRENALS:  Normal.    - KIDNEYS/URETERS: No hydronephrosis or significant mass.    - BLADDER: Thick walled    - REPRODUCTIVE ORGANS: No pelvic masses.    - BOWEL: Extensive diverticulosis coli.    - LYMPH NODES: No significant retroperitoneal, mesenteric, or pelvic adenopathy.    - BONES: No fracture or significant bone lesion.    - VASCULATURE: Mild aneurysmal dilatation of the infrarenal abdominal aorta at  3.2 cm in size. Demonstration of chronic aortic dissection as demonstrated by  displaced mural calcifications. On previous CT from August 31, 2023 there is  demonstration of a large amount of mural thrombus as well as the chronic  appearing dissection    - OTHER: No ascites.      Impression    1. Per history provided patient swallowed a wooden toothpick. Given its wooden  nature, this is rather difficult to identify on this evaluation. Although it  can't be rendered from this study is that there is no current evidence of  obstruction.  2. 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  3. Ectatic ascending thoracic  aorta.  4. Diverticulosis coli.  5. Thickened urinary bladder. Correlate.  6. Moderate degenerative changes of the thoracolumbar spine.              Electronically signed by Robert Fox   Basic Metabolic Panel   Result Value Ref Range    Sodium 138 133 - 143 mmol/L    Potassium 4.1 3.5 - 5.1 mmol/L    Chloride 103 98 - 107 mmol/L    CO2 23 20 - 29 mmol/L    Anion Gap 12 7 - 16 mmol/L    Glucose 104 (H) 70 - 99 mg/dL    BUN 17 8 - 23 MG/DL    Creatinine 8.78 9.19 - 1.30 MG/DL    Est, Glom Filt Rate 44 (L) >60 ml/min/1.44m2    Calcium  9.1 8.8 - 10.2 MG/DL   CBC   Result Value Ref Range    WBC 6.8 4.3 - 11.1 K/uL    RBC 4.32 4.05 - 5.20 M/uL    Hemoglobin 13.9 11.7 - 15.4 g/dL    Hematocrit 58.4 64.1 - 46.3 %    MCV 96.1 82.0 - 102.0 FL    MCH 32.2 26.1 - 32.9 PG    MCHC 33.5 31.4 - 35.0 g/dL    RDW 86.2 88.0 - 85.3 %    Platelets 210 150 - 450 K/uL    MPV 9.8 8.8 - 12.5 FL    nRBC 0.00 0.0 - 0.2 K/uL         CT CHEST ABDOMEN PELVIS WO CONTRAST Additional Contrast? None   Final Result   1. Per history provided patient swallowed a wooden toothpick. Given its wooden  nature, this is rather difficult to identify on this evaluation. Although it   can't be rendered from this study is that there is no current evidence of   obstruction.   2. 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   3. Ectatic ascending thoracic aorta.   4. Diverticulosis coli.   5. Thickened urinary bladder. Correlate.   6. Moderate degenerative changes of the thoracolumbar spine.                     Electronically signed by Robert Fox                   No results for input(s): COVID19 in the last 72 hours.    Voice dictation software was used during the making of this note.  This software is not perfect and grammatical and other typographical errors may be present.  This note has not been completely proofread for errors.        Gerome Beverley HERO, MD  06/28/24 1910    "

## 2024-06-28 NOTE — Progress Notes (Signed)
"  4 Eyes Skin Assessment     NAME:  Audrey Snow  DATE OF BIRTH:  [birth year]  MEDICAL RECORD NUMBER:  [id number]    The patient is being assessed for  Admission    I agree that at least one RN has performed a thorough Head to Toe Skin Assessment on the patient. ALL assessment sites listed below have been assessed.      Areas assessed by both nurses:    Head, Face, Ears, Shoulders, Back, Chest, Arms, Elbows, Hands, Sacrum. Buttock, Coccyx, Ischium, and Legs. Feet and Heels        Does the Patient have a Wound? No noted wound(s)       Braden Prevention initiated by RN: Yes  Wound Care Orders initiated by RN: No    For hospital-acquired stage 1 & 2 and ALL Stage 3,4, Unstageable, DTI, NWPT, and Complex wounds: place order IP Wound Care/Ostomy Nurse Eval and Treat by RN under ORDER ENTRY: No    New Ostomies, if present place, Ostomy referral order under ORDER ENTRY: No     Nurse 1 eSignature: Electronically signed by Myles GORMAN Pih, RN on 06/28/24 at 10:22 PM EST    **SHARE this note so that the co-signing nurse can place an eSignature**    Nurse 2 eSignature: Electronically signed by Bertram LOISE Lento, RN on 06/28/24 at 11:41 PM EST    "

## 2024-06-28 NOTE — Progress Notes (Signed)
"  TRANSFER - IN REPORT:    Verbal report received from [facility], RN on Audrey Snow  being received from [facility] ED for routine progression of patient care      Report consisted of patient's Situation, Background, Assessment and   Recommendations(SBAR).     Information from the following report(s) Adult Overview, Intake/Output, and MAR was reviewed with the receiving nurse.    Opportunity for questions and clarification was provided.      Assessment completed upon patient's arrival to unit and care assumed.     "

## 2024-06-29 ENCOUNTER — Observation Stay
Admission: EM | Admit: 2024-06-29 | Discharge: 2024-06-29 | Disposition: A | Discharge status: Home / Self Care | Payer: MEDICARE | Source: Other Acute Inpatient Hospital | Attending: Internal Medicine | Admitting: Internal Medicine

## 2024-06-29 DIAGNOSIS — T189XXA Foreign body of alimentary tract, part unspecified, initial encounter: Secondary | ICD-10-CM

## 2024-06-29 LAB — BASIC METABOLIC PANEL W/ REFLEX TO MG FOR LOW K
Anion Gap: 11 mmol/L (ref 7–16)
BUN: 15 mg/dL (ref 8–23)
CO2: 25 mmol/L (ref 20–29)
Calcium: 8.7 mg/dL — ABNORMAL LOW (ref 8.8–10.2)
Chloride: 102 mmol/L (ref 98–107)
Creatinine: 1.16 mg/dL — ABNORMAL HIGH (ref 0.60–1.10)
Est, Glom Filt Rate: 46 ml/min/1.73m2 — ABNORMAL LOW (ref >60)
Glucose: 88 mg/dL (ref 70–99)
Potassium: 4.3 mmol/L (ref 3.5–5.1)
Sodium: 138 mmol/L (ref 136–145)

## 2024-06-29 LAB — CBC WITH AUTO DIFFERENTIAL
Basophils %: 0.6 % (ref 0.0–2.0)
Basophils Absolute: 0.04 K/UL (ref 0.00–0.20)
Eosinophils %: 1.3 % (ref 0.5–7.8)
Eosinophils Absolute: 0.08 K/UL (ref 0.00–0.80)
Hematocrit: 39.1 % (ref 35.8–46.3)
Hemoglobin: 12.8 g/dL (ref 11.7–15.4)
Immature Granulocytes %: 0.2 % (ref 0.0–5.0)
Immature Granulocytes Absolute: 0.01 K/UL (ref 0.0–0.5)
Lymphocytes %: 30.2 % (ref 13.0–44.0)
Lymphocytes Absolute: 1.89 K/UL (ref 0.50–4.60)
MCH: 32.1 pg (ref 26.1–32.9)
MCHC: 32.7 g/dL (ref 31.4–35.0)
MCV: 98 FL (ref 82–102)
MPV: 10.6 FL (ref 8.8–12.5)
Monocytes %: 9.7 % (ref 4.0–12.0)
Monocytes Absolute: 0.61 K/UL (ref 0.10–1.30)
Neutrophils %: 58 % (ref 43.0–78.0)
Neutrophils Absolute: 3.63 K/UL (ref 1.70–8.20)
Platelets: 193 K/uL (ref 150–450)
RBC: 3.99 M/uL — ABNORMAL LOW (ref 4.05–5.2)
RDW: 13.9 % (ref 11.9–14.6)
WBC: 6.3 K/uL (ref 4.3–11.1)
nRBC: 0 K/uL (ref 0.0–0.2)

## 2024-06-29 MED ORDER — FAMOTIDINE 20 MG PO TABS
20 | Freq: Every day | ORAL | Status: DC | PRN
Start: 2024-06-29 — End: 2024-06-29

## 2024-06-29 MED ORDER — SODIUM CHLORIDE 0.9 % IV SOLN
0.9 | INTRAVENOUS | Status: DC | PRN
Start: 2024-06-29 — End: 2024-06-29

## 2024-06-29 MED ORDER — POTASSIUM BICARB-CITRIC ACID 20 MEQ PO TBEF
20 | ORAL | Status: DC | PRN
Start: 2024-06-29 — End: 2024-06-29

## 2024-06-29 MED ORDER — PROPOFOL 200 MG/20ML IV EMUL
200 | Freq: Once | INTRAVENOUS | Status: DC | PRN
Start: 2024-06-29 — End: 2024-06-29
  Administered 2024-06-29: 19:00:00 130 via INTRAVENOUS

## 2024-06-29 MED ORDER — MELATONIN 3 MG PO TABS
3 | Freq: Every evening | ORAL | Status: DC | PRN
Start: 2024-06-29 — End: 2024-06-29

## 2024-06-29 MED ORDER — LEVOTHYROXINE SODIUM 125 MCG PO TABS
125 | Freq: Every day | ORAL | Status: DC
Start: 2024-06-29 — End: 2024-06-29

## 2024-06-29 MED ORDER — ACETAMINOPHEN 650 MG RE SUPP
650 | Freq: Four times a day (QID) | RECTAL | Status: DC | PRN
Start: 2024-06-29 — End: 2024-06-29

## 2024-06-29 MED ORDER — NORMAL SALINE FLUSH 0.9 % IV SOLN
0.9 | INTRAVENOUS | Status: DC | PRN
Start: 2024-06-29 — End: 2024-06-29

## 2024-06-29 MED ORDER — ACETAMINOPHEN 325 MG PO TABS
325 | Freq: Four times a day (QID) | ORAL | Status: DC | PRN
Start: 2024-06-29 — End: 2024-06-29

## 2024-06-29 MED ORDER — ALBUTEROL SULFATE (2.5 MG/3ML) 0.083% IN NEBU
Freq: Four times a day (QID) | RESPIRATORY_TRACT | Status: DC | PRN
Start: 2024-06-29 — End: 2024-06-29

## 2024-06-29 MED ORDER — ONDANSETRON HCL 4 MG/2ML IJ SOLN
4 | Freq: Four times a day (QID) | INTRAMUSCULAR | Status: DC | PRN
Start: 2024-06-29 — End: 2024-06-28

## 2024-06-29 MED ORDER — LACTATED RINGERS IV SOLN
INTRAVENOUS | Status: DC | PRN
Start: 2024-06-29 — End: 2024-06-29
  Administered 2024-06-29: 19:00:00 via INTRAVENOUS

## 2024-06-29 MED ORDER — EPHEDRINE SULFATE (PRESSORS) 5 MG/ML IV SOLN
5 | Freq: Once | INTRAVENOUS | Status: DC | PRN
Start: 2024-06-29 — End: 2024-06-29
  Administered 2024-06-29: 19:00:00 10 via INTRAVENOUS

## 2024-06-29 MED ORDER — POTASSIUM CHLORIDE CRYS ER 20 MEQ PO TBCR
20 | ORAL | Status: DC | PRN
Start: 2024-06-29 — End: 2024-06-29

## 2024-06-29 MED ORDER — METOPROLOL SUCCINATE ER 50 MG PO TB24
50 | Freq: Every day | ORAL | Status: DC
Start: 2024-06-29 — End: 2024-06-29
  Administered 2024-06-29: 15:00:00 50 mg via ORAL

## 2024-06-29 MED ORDER — SUCCINYLCHOLINE CHLORIDE 20 MG/ML IJ SOLN
20 | Freq: Once | INTRAMUSCULAR | Status: DC | PRN
Start: 2024-06-29 — End: 2024-06-29
  Administered 2024-06-29: 19:00:00 120 via INTRAVENOUS

## 2024-06-29 MED ORDER — GLYCOPYRROLATE 0.4 MG/2ML IJ SOLN
0.4 | Freq: Once | INTRAMUSCULAR | Status: DC | PRN
Start: 2024-06-29 — End: 2024-06-29
  Administered 2024-06-29: 19:00:00 .2 via INTRAVENOUS

## 2024-06-29 MED ORDER — MAGNESIUM SULFATE IN D5W 1-5 GM/100ML-% IV SOLN
1-5 | INTRAVENOUS | Status: DC | PRN
Start: 2024-06-29 — End: 2024-06-29

## 2024-06-29 MED ORDER — LOSARTAN POTASSIUM 50 MG PO TABS
50 | Freq: Every day | ORAL | Status: DC
Start: 2024-06-29 — End: 2024-06-29

## 2024-06-29 MED ORDER — POLYETHYLENE GLYCOL 3350 17 G PO PACK
17 | Freq: Every day | ORAL | Status: DC | PRN
Start: 2024-06-29 — End: 2024-06-29

## 2024-06-29 MED ORDER — POTASSIUM CHLORIDE 10 MEQ/100ML IV SOLN
10 | INTRAVENOUS | Status: DC | PRN
Start: 2024-06-29 — End: 2024-06-29

## 2024-06-29 MED ORDER — ATORVASTATIN CALCIUM 10 MG PO TABS
10 | Freq: Every evening | ORAL | Status: DC
Start: 2024-06-29 — End: 2024-06-29
  Administered 2024-06-29: 03:00:00 20 mg via ORAL

## 2024-06-29 MED ORDER — FOLIC ACID 1 MG PO TABS
1 | Freq: Every day | ORAL | Status: DC
Start: 2024-06-29 — End: 2024-06-29

## 2024-06-29 MED ORDER — NORMAL SALINE FLUSH 0.9 % IV SOLN
0.9 | Freq: Two times a day (BID) | INTRAVENOUS | Status: DC
Start: 2024-06-29 — End: 2024-06-29
  Administered 2024-06-29 (×2): 10 mL via INTRAVENOUS

## 2024-06-29 MED ORDER — ONDANSETRON 4 MG PO TBDP
4 | Freq: Three times a day (TID) | ORAL | Status: DC | PRN
Start: 2024-06-29 — End: 2024-06-28

## 2024-06-29 MED ORDER — BISACODYL 10 MG RE SUPP
10 | Freq: Every day | RECTAL | Status: DC | PRN
Start: 2024-06-29 — End: 2024-06-29

## 2024-06-29 MED ORDER — ALUM & MAG HYDROXIDE-SIMETH 200-200-20 MG/5ML PO SUSP
200-200-20 | Freq: Four times a day (QID) | ORAL | Status: DC | PRN
Start: 2024-06-29 — End: 2024-06-29

## 2024-06-29 MED FILL — FOLIC ACID 1 MG PO TABS: 1 mg | ORAL | Qty: 1 | Fill #0

## 2024-06-29 MED FILL — METOPROLOL SUCCINATE ER 50 MG PO TB24: 50 mg | ORAL | Qty: 1 | Fill #0

## 2024-06-29 MED FILL — SYNTHROID 125 MCG PO TABS: 125 ug | ORAL | Qty: 1 | Fill #0

## 2024-06-29 MED FILL — ATORVASTATIN CALCIUM 10 MG PO TABS: 10 mg | ORAL | Qty: 2 | Fill #0

## 2024-06-29 MED FILL — LOSARTAN POTASSIUM 50 MG PO TABS: 50 mg | ORAL | Qty: 2 | Fill #0

## 2024-06-29 NOTE — Progress Notes (Signed)
"  TRANSFER - OUT REPORT:    Verbal report given to Jilliana, RN on [name]  being transferred to Preop for ordered procedure       Report consisted of patient's Situation, Background, Assessment and   Recommendations(SBAR).     Information from the following report(s) Nurse Handoff Report was reviewed with the receiving nurse.           Lines:   Peripheral IV 06/28/24 Left Antecubital (Active)   Site Assessment Clean, dry & intact 06/29/24 1232   Line Status Capped;Normal saline locked 06/29/24 1232   Line Care Connections checked and tightened 06/29/24 1232   Phlebitis Assessment No symptoms 06/29/24 1232   Infiltration Assessment 0 06/29/24 1232   Alcohol Cap Used Yes 06/29/24 1232   Dressing Status Clean, dry & intact 06/29/24 1232   Dressing Type Transparent 06/29/24 1232        Opportunity for questions and clarification was provided.      Patient transported with:         "

## 2024-06-29 NOTE — Discharge Summary (Addendum)
 "  Hospitalist Discharge Summary   Admit Date:  06/28/2024  8:39 PM   DC Note date: 06/29/2024  Name:  Audrey Snow   Age:  83 y.o.  Sex:  female  DOB:  Aug 09, 1940   MRN:  [id number]   HAR:  [id number]  Room:  605/01  PCP:  Solon Jori JINNY, MD    Presenting Complaint: No chief complaint on file.     Initial Admission Diagnosis: H/O swallowed foreign body [Z87.821]     Problem List for this Hospitalization (present on admission):    Principal Problem:    Swallowed foreign body  Active Problems:    Short-term memory loss    Rheumatoid arthritis involving multiple sites with positive rheumatoid factor (HCC)    Hyperlipidemia    HTN (hypertension)    Large cell carcinoma of lung, right (HCC)    Hypothyroidism  Resolved Problems:    * No resolved hospital problems. *      Hospital Course:  Audrey Snow is a 83 y.o. female who is a transfer from [place] [facility] for observation for swallowed toothpick unintentionally. Patient has been completely asymptomatic.  Endoscopy normal.   If she develops abdominal pain would need to come back to get a CT scan.  It is surprising she had no pain when she actually swallowed it which calls into question whether it happened.  No damage seen on endoscopy which you would expect at least a little bit of, unless it was already chewed up or softened to the point the puncture or laceration risk is low.  Stomach moisture likely to soften the wood further though it will not dissolve, being plant material.    Disposition: Home  Diet: Diet NPO  Code Status: Full Code    Follow Ups:  Follow-up Information    None         Future Appointments         Provider Specialty Dept Phone    09/05/2024 9:00 AM PERIPHERAL Lab [phone]    09/05/2024 10:00 AM Mertha Garnette DEL, MD Oncology [phone]    10/06/2024 9:00 AM Betancur Junita Marven HERO, MD Internal Medicine [phone]            Follow up labs/diagnostics (ultimately defer to outpatient provider):  Defer to Follow-up Provider    Plan was  discussed with Patient.  All questions answered.  Patient was stable at time of discharge.  Instructions given to call a physician or return if any concerns.    Discharge Instructions         If any severe abdominal pain return to ER for CT scan            Current Discharge Medication List        CONTINUE these medications which have NOT CHANGED    Details   losartan  (COZAAR ) 100 MG tablet Take 1 tablet by mouth daily TAKE 1 TABLET BY MOUTH DAILY  Qty: 30 tablet, Refills: 0      atorvastatin  (LIPITOR ) 20 MG tablet Take 1 tablet by mouth daily  Qty: 90 tablet, Refills: 1      metoprolol  succinate (TOPROL  XL) 50 MG extended release tablet Take 1 tablet by mouth daily  Qty: 90 tablet, Refills: 1      levothyroxine  (SYNTHROID ) 125 MCG tablet TAKE 1 TABLET BY MOUTH DAILY  Qty: 90 tablet, Refills: 1      albuterol  sulfate HFA (PROVENTIL ;VENTOLIN ;PROAIR ) 108 (90 Base) MCG/ACT inhaler Inhale 1 puff into the lungs  every 4 hours as needed for Shortness of Breath or Wheezing  Qty: 18 g, Refills: 11      folic acid  (FOLVITE ) 1 MG tablet Take 1 tablet by mouth daily      Cholecalciferol 100 MCG (4000 UT) TABS Take 1 tablet by mouth daily             Some of the medications may be marked as stop taking by the system; but in reality patient or family reported already being off these meds; defer to outpatient/prescribing providers.      Procedures done this admission:  Procedure(s):  ESOPHAGOGASTRODUODENOSCOPY    Consults this admission:  IP CONSULT TO GI    Consultants will arrange their own follow ups, if applicable/necessary.    Echocardiogram results:  No results found for this or any previous visit.      Diagnostic Imaging/Tests:   CT CHEST ABDOMEN PELVIS WO CONTRAST Additional Contrast? None  Result Date: 06/28/2024  1. Per history provided patient swallowed a wooden toothpick. Given its wooden nature, this is rather difficult to identify on this evaluation. Although it can't be rendered from this study is that there is no  current evidence of obstruction. 2. 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 3. Ectatic ascending thoracic aorta. 4. Diverticulosis coli. 5. Thickened urinary bladder. Correlate. 6. Moderate degenerative changes of the thoracolumbar spine. Electronically signed by Robert Fox       Labs: Results:       BMP, Mg, Phos Recent Labs     06/28/24  1655 06/29/24  0345   NA 138 138   K 4.1 4.3   CL 103 102   CO2 23 25   ANIONGAP 12 11   BUN 17 15   CREATININE 1.21 1.16*   LABGLOM 44* 46*   CALCIUM  9.1 8.7*   GLUCOSE 104* 88      CBC Recent Labs     06/28/24  1655 06/29/24  0345   WBC 6.8 6.3   RBC 4.32 3.99*   HGB 13.9 12.8   HCT 41.5 39.1   MCV 96.1 98.0   MCH 32.2 32.1   MCHC 33.5 32.7   RDW 13.7 13.9   PLT 210 193   MPV 9.8 10.6   NRBC 0.00 0.00   LYMPHOPCT  --  30.2   MONOPCT  --  9.7   BASOPCT  --  0.6   IMMGRAN  --  0.2   LYMPHSABS  --  1.89   EOSABS  --  0.08   MONOSABS  --  0.61   BASOSABS  --  0.04   ABSIMMGRAN  --  0.01      LFT No results for input(s): BILITOT, BILIDIR, ALKPHOS, AST, ALT, PROTEIN, ALBUMIN, GLOB in the last 72 hours.   Cardiac  No results found for: NTPROBNP, TROPHS   Coags Lab Results   Component Value Date/Time    PROTIME 13.7 05/02/2020 12:11 PM    PROTIME 13.5 04/05/2020 11:46 AM    INR 1.0 05/02/2020 12:11 PM    INR 1.0 04/05/2020 11:46 AM    APTT 28.9 04/05/2020 11:46 AM      A1c Lab Results   Component Value Date/Time    LABA1C 6.0 12/23/2023 02:59 PM    LABA1C 6.2 06/24/2023 03:44 PM    LABA1C 6.3 12/16/2022 03:22 PM    EAG 127 12/23/2023 02:59 PM    EAG 130 06/24/2023 03:44 PM    EAG 134 12/16/2022 03:22  PM      Lipids Lab Results   Component Value Date/Time    CHOL 217 12/23/2023 02:59 PM    HDL 59 12/23/2023 02:59 PM    CHOLHDLRATIO 3.7 12/23/2023 02:59 PM    TRIG 88 12/23/2023 02:59 PM      Thyroid  No results found for: TSHELE, TSH3GEN     Most Recent UA Lab Results   Component Value Date/Time    COLORU YELLOW 05/02/2020  12:11 PM    PROTEINU Negative 05/02/2020 12:11 PM    GLUCOSEU Negative 05/02/2020 12:11 PM    KETUA Negative 05/02/2020 12:11 PM    BILIRUBINUR Negative 05/02/2020 12:11 PM    BLOODU Negative 05/02/2020 12:11 PM    NITRU Negative 05/02/2020 12:11 PM    LEUKOCYTESUR Negative 05/02/2020 12:11 PM        Microbiology:  Results       ** No results found for the last 336 hours. **            All Labs from Last 24 Hrs:  Recent Results (from the past 24 hours)   Basic Metabolic Panel    Collection Time: 06/28/24  4:55 PM   Result Value Ref Range    Sodium 138 133 - 143 mmol/L    Potassium 4.1 3.5 - 5.1 mmol/L    Chloride 103 98 - 107 mmol/L    CO2 23 20 - 29 mmol/L    Anion Gap 12 7 - 16 mmol/L    Glucose 104 (H) 70 - 99 mg/dL    BUN 17 8 - 23 MG/DL    Creatinine 8.78 9.19 - 1.30 MG/DL    Est, Glom Filt Rate 44 (L) >60 ml/min/1.41m2    Calcium  9.1 8.8 - 10.2 MG/DL   CBC    Collection Time: 06/28/24  4:55 PM   Result Value Ref Range    WBC 6.8 4.3 - 11.1 K/uL    RBC 4.32 4.05 - 5.20 M/uL    Hemoglobin 13.9 11.7 - 15.4 g/dL    Hematocrit 58.4 64.1 - 46.3 %    MCV 96.1 82.0 - 102.0 FL    MCH 32.2 26.1 - 32.9 PG    MCHC 33.5 31.4 - 35.0 g/dL    RDW 86.2 88.0 - 85.3 %    Platelets 210 150 - 450 K/uL    MPV 9.8 8.8 - 12.5 FL    nRBC 0.00 0.0 - 0.2 K/uL   Basic Metabolic Panel w/ Reflex to MG    Collection Time: 06/29/24  3:45 AM   Result Value Ref Range    Sodium 138 136 - 145 mmol/L    Potassium 4.3 3.5 - 5.1 mmol/L    Chloride 102 98 - 107 mmol/L    CO2 25 20 - 29 mmol/L    Anion Gap 11 7 - 16 mmol/L    Glucose 88 70 - 99 mg/dL    BUN 15 8 - 23 MG/DL    Creatinine 8.83 (H) 0.60 - 1.10 MG/DL    Est, Glom Filt Rate 46 (L) >60 ml/min/1.12m2    Calcium  8.7 (L) 8.8 - 10.2 MG/DL   CBC with Auto Differential    Collection Time: 06/29/24  3:45 AM   Result Value Ref Range    WBC 6.3 4.3 - 11.1 K/uL    RBC 3.99 (L) 4.05 - 5.2 M/uL    Hemoglobin 12.8 11.7 - 15.4 g/dL    Hematocrit 60.8 64.1 - 46.3 %  MCV 98.0 82 - 102 FL    MCH 32.1  26.1 - 32.9 PG    MCHC 32.7 31.4 - 35.0 g/dL    RDW 86.0 88.0 - 85.3 %    Platelets 193 150 - 450 K/uL    MPV 10.6 8.8 - 12.5 FL    nRBC 0.00 0.0 - 0.2 K/uL    Differential Type AUTOMATED      Neutrophils % 58.0 43.0 - 78.0 %    Lymphocytes % 30.2 13.0 - 44.0 %    Monocytes % 9.7 4.0 - 12.0 %    Eosinophils % 1.3 0.5 - 7.8 %    Basophils % 0.6 0.0 - 2.0 %    Immature Granulocytes % 0.2 0.0 - 5.0 %    Neutrophils Absolute 3.63 1.70 - 8.20 K/UL    Lymphocytes Absolute 1.89 0.50 - 4.60 K/UL    Monocytes Absolute 0.61 0.10 - 1.30 K/UL    Eosinophils Absolute 0.08 0.00 - 0.80 K/UL    Basophils Absolute 0.04 0.00 - 0.20 K/UL    Immature Granulocytes Absolute 0.01 0.0 - 0.5 K/UL       No results for input(s): COVID19 in the last 72 hours.    Recent Vital Data:  Patient Vitals for the past 24 hrs:   Temp Pulse Resp BP SpO2   06/29/24 1310 97.7 F (36.5 C) 69 16 (!) 157/59 93 %   06/29/24 1307 97.5 F (36.4 C) 60 16 130/74 93 %   06/29/24 1001 -- 60 -- 130/74 --   06/29/24 0810 97.5 F (36.4 C) (!) 48 16 118/65 --   06/29/24 0326 97.5 F (36.4 C) 51 18 138/67 93 %   06/28/24 2043 98.6 F (37 C) 66 18 124/80 98 %       Oxygen Therapy  SpO2: 93 %  Pulse Oximeter Device Location: Right, Finger  O2 Device: None (Room air)  O2 Flow Rate (L/min): 94 L/min    Estimated body mass index is 21.93 kg/m as calculated from the following:    Height as of this encounter: 1.702 m (5' 7).    Weight as of this encounter: 63.5 kg (140 lb).    Intake/Output Summary (Last 24 hours) at 06/29/2024 1419  Last data filed at 06/29/2024 1409  Gross per 24 hour   Intake 200 ml   Output 0 ml   Net 200 ml         Physical Exam:    General:    Well nourished.  No overt distress  Head:  Normocephalic, atraumatic  Eyes:  Sclerae appear normal.  Pupils equally round.    CV:   RRR.  No JVD  Lungs:   Respirations even, unlabored  Abdomen:   nondistended.    Extremities: Warm and dry.   No edema.    Skin:     No rashes.  Normal coloration  Neuro:  CN  II-XII grossly intact.  Psych:  Mood and affect appropriate          Time spent in patient discharge and coordination 35 minutes.      Signed:  Garnette Roslynn Sieving, MD    Part of this note may have been written by using a voice dictation software.  The note has been proof read but may still contain some grammatical/other typographical errors.  "

## 2024-06-29 NOTE — Periop Note (Signed)
"  TRANSFER - OUT REPORT:    Verbal report given to Traci, RN on Audrey Snow  being transferred to Room 605 for routine progression of patient care       Report consisted of patients Situation, Background, Assessment and   Recommendations(SBAR).     Information from the following report(s) Nurse Handoff Report, Index, Adult Overview, Surgery Report, Cardiac Rhythm SR, and Neuro Assessment was reviewed with the receiving nurse.    Lines:   Peripheral IV 06/28/24 Left Antecubital (Active)   Site Assessment Clean, dry & intact 06/29/24 1232   Line Status Capped;Normal saline locked 06/29/24 1232   Line Care Connections checked and tightened 06/29/24 1232   Phlebitis Assessment No symptoms 06/29/24 1232   Infiltration Assessment 0 06/29/24 1232   Alcohol Cap Used Yes 06/29/24 1232   Dressing Status Clean, dry & intact 06/29/24 1232   Dressing Type Transparent 06/29/24 1232        Opportunity for questions and clarification was provided.      Patient transported with:   Tech    VTE prophylaxis orders have been written for [name].    Patient and family given floor number and nurses name.  Family updated re: pt status after security code verified.    "

## 2024-06-29 NOTE — Plan of Care (Signed)
"    Problem: Discharge Planning  Goal: Discharge to home or other facility with appropriate resources  06/29/2024 1305 by Craven Corning, RN  Outcome: Progressing  Flowsheets (Taken 06/29/2024 0740)  Discharge to home or other facility with appropriate resources: Identify barriers to discharge with patient and caregiver  06/28/2024 2338 by Alverta Myles RAMAN, RN  Outcome: Progressing     Problem: Safety - Adult  Goal: Free from fall injury  06/29/2024 1305 by Craven Corning, RN  Outcome: Progressing  06/28/2024 2338 by Alverta Myles RAMAN, RN  Outcome: Progressing     "

## 2024-06-29 NOTE — Consults (Signed)
 "Consult Note            Date:06/29/2024        Patient Name:Audrey Snow     Date of Birth:[birth year]     Age:83 y.o.    Inpatient consult to GI  Consult performed by: Verdie Passer, MD  Consult ordered by: Celestia Iha, DO          Chief Complaint   No chief complaint on file.          History Obtained From   patient    History of Present Illness   This is an 83 year old female who is a transfer from [facility] admitted for observation due to swallowing a wooden toothpick unintentionally.  Upon admission, she denied any abdominal discomfort. We were consulted to evaluate whether she needs an EGD. CT abdomen pelvis noted it was difficult to visualize toothpick but there was no current evidence of obstruction.  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  Colonoscopy records limited but possibly done in 2019 with polypectomy. Today, patient denies any difficulty swallowing or other GI complaints.     Labs: Creatinine 1.16.  Other labs unremarkable    Past Medical History     Past Medical History:   Diagnosis Date    Arrhythmia 04/05/2020    PVCs.  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    Chronic obstructive pulmonary disease (HCC)     uses inhalers    Chronic pain of left knee 06/25/2018    Coronary atherosclerosis     noted on chest CT    COVID-19 vaccine series completed 08/15/2019    Pfizer; 07/29/2019 and booster given 03/12/2020    GERD (gastroesophageal reflux disease)     no meds at this time    H/O cardiovascular stress test     Bruce protocol, low risk study, EF of 67% and normal LV function    H/O mammogram 07/28/2016    mammogram and Ultrasound of right breaset- benign finding- [facility]     Hearing loss     only hears  out of left ear    Hearing loss of both ears     no hearing aids     HTN (hypertension), benign 01/30/2014    controlled w/med    Hypothyroid     on med for control    Menopause     Osteoarthritis     Osteoporosis     prolia     Poor historian     patient had trouble remembering names of meds and what they were for and had to repeat instructions for surgery several times     Rheumatoid arthritis(714.0) 01/30/2014    followed by Dr. Leonia. 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 Mateo in past    COLONOSCOPY N/A 01/27/2018    COLONOSCOPY/BMI 26 performed by Bertrum Toribio HERO, MD at [facility] ENDOSCOPY    COLONOSCOPY FLX DX W/COLLJ SPEC WHEN PFRMD  01/27/2018         CT NEEDLE BIOPSY LUNG PERCUTANEOUS W IMAGING GUIDANCE  04/16/2020    CT NEEDLE BIOPSY LUNG PERCUTANEOUS 04/16/2020 [facility]Y CT SCAN    HEMORRHOID SURGERY      OTHER SURGICAL HISTORY  09/2012    sinus surgery-Dr. Kennedy    OTHER SURGICAL HISTORY  01/27/2013    Thymoma-Dr. Osa    TONSILLECTOMY  83 yrs old    UPPER GASTROINTESTINAL ENDOSCOPY  2015    Gastric AVM txed with cautery-Dr. Naas        Medications     Prior to Admission medications   Medication Sig Start Date End Date Taking? Authorizing Provider   losartan  (COZAAR ) 100 MG tablet Take 1 tablet by mouth daily TAKE 1 TABLET BY MOUTH DAILY 06/15/24  Yes Paduano, Julia S, APRN - CNP   atorvastatin  (LIPITOR ) 20 MG tablet Take 1 tablet by mouth daily 02/09/24 03/06/25 Yes Blackstone, Connie J, MD   metoprolol  succinate (TOPROL  XL) 50 MG extended release tablet Take 1 tablet by mouth daily 02/09/24 02/08/25 Yes Blackstone, Connie J, MD   levothyroxine   (SYNTHROID ) 125 MCG tablet TAKE 1 TABLET BY MOUTH DAILY 02/08/24  Yes Blackstone, Connie J, MD   albuterol  sulfate HFA (PROVENTIL ;VENTOLIN ;PROAIR ) 108 (90 Base) MCG/ACT inhaler Inhale 1 puff into the lungs every 4 hours as needed for Shortness of Breath or Wheezing 07/16/23  Yes Julieann Dorthea HERO, APRN - CNP   folic acid  (FOLVITE ) 1 MG tablet Take 1 tablet by mouth daily 01/21/23  Yes [provider]   Cholecalciferol 100 MCG (4000 UT) TABS Take 1 tablet by mouth daily 07/18/21  Yes [provider]        sodium chloride  flush 0.9 % injection 5-40 mL, 2 times per day  sodium chloride  flush 0.9 % injection 5-40 mL, PRN  0.9 % sodium chloride  infusion, PRN  potassium chloride  (KLOR-CON  M) extended release tablet 20 mEq, PRN   Or  potassium bicarb-citric acid  (EFFER-K) effervescent tablet 40 mEq, PRN   Or  potassium chloride  10 mEq/100 mL IVPB (Peripheral Line), PRN  magnesium  sulfate 1000 mg in dextrose 5% 100 mL IVPB, PRN  melatonin tablet 3 mg, Nightly PRN  polyethylene glycol (GLYCOLAX ) packet 17 g, Daily PRN  bisacodyl  (DULCOLAX) suppository 10 mg, Daily PRN  famotidine  (PEPCID ) tablet 10 mg, Daily PRN  aluminum & magnesium  hydroxide-simethicone (MAALOX PLUS) 200-200-20 MG/5ML suspension 30 mL, Q6H PRN  acetaminophen  (TYLENOL ) tablet 650 mg, Q6H PRN   Or  acetaminophen  (TYLENOL ) suppository 650 mg, Q6H PRN  albuterol  (PROVENTIL ) (2.5 MG/3ML) 0.083% nebulizer solution 2.5 mg, Q6H PRN  atorvastatin  (LIPITOR ) tablet 20 mg, Nightly  folic acid  (FOLVITE ) tablet 1 mg, Daily  levothyroxine  (SYNTHROID ) tablet 125 mcg, Daily  losartan  (COZAAR ) tablet 100 mg, Daily  metoprolol  succinate (TOPROL  XL) extended release tablet 50 mg, Daily        Allergies   Yeast (saccharomyces cerevisiae) and Yeast-derived drug products    Social History     Social History       Tobacco History       Smoking Status  Former Smoking Start Date  07/08/1959 Quit Date  07/08/1983 Average Packs/Day  1 pack/day  for 24.0 years (24.0 ttl pk-yrs)  Smoking Tobacco Type  Cigarettes from 07/08/1959 to 07/08/1983   Pack Year History     Packs/Day From To Years    0 07/08/1983  41.0    1 07/08/1959 07/08/1983 24.0      Smokeless Tobacco Use  Never      Tobacco Comments  Quit smoking: not regular when first started- 1pk/week              Alcohol History       Alcohol Use Status  Never              Drug Use       Drug Use Status  No              Sexual Activity       Sexually Active  Not Currently Partners  Female                    Family History     Family History   Problem Relation Age of Onset    Osteoarthritis Mother     Breast Cancer Neg Hx     Alzheimer's Disease Father        Review of Systems   Review of Systems   Constitutional:  Negative for activity change and appetite change.   Gastrointestinal:  Negative for abdominal distention, abdominal pain and anal bleeding.        Physical Exam   BP 138/67   Pulse 51   Temp 97.5 F (36.4 C) (Oral)   Resp 18   Ht 1.702 m (5' 7)   Wt 63.5 kg (140 lb)   SpO2 93%   BMI 21.93 kg/m      Physical Exam  Constitutional:       Appearance: Normal appearance.   HENT:      Right Ear: Decreased hearing noted.      Left Ear: Decreased hearing noted.   Abdominal:      General: Abdomen is flat. Bowel sounds are normal. There is no distension.      Palpations: Abdomen is soft.      Tenderness: There is no abdominal tenderness.   Musculoskeletal:      Right lower leg: No edema.      Left lower leg: No edema.   Skin:     Coloration: Skin is not jaundiced.   Neurological:      Mental Status: She is alert.         Labs    CBC:  Recent Labs     06/28/24  1655 06/29/24  0345   WBC 6.8 6.3   RBC 4.32 3.99*   HGB 13.9 12.8   HCT 41.5 39.1   MCV 96.1 98.0   RDW 13.7 13.9   PLT 210 193     CHEMISTRIES:  Recent Labs     06/28/24  1655 06/29/24  0345   NA 138 138   K 4.1 4.3   CL 103 102   CO2 23 25   BUN 17 15   CREATININE 1.21 1.16*   GLUCOSE 104* 88     PT/INR:No results for input(s): PROTIME, INR in the last 72 hours.  APTT:No results  for input(s): APTT in the last 72 hours.  LIVER PROFILE:No results for input(s): AST, ALT, BILIDIR, BILITOT, ALKPHOS in the last 72 hours.    Imaging/Diagnostics   CT CHEST ABDOMEN PELVIS WO CONTRAST Additional Contrast? None  Result Date: 06/28/2024  1. Per history provided patient swallowed a wooden toothpick. Given its wooden nature, this is rather difficult to identify on this evaluation. Although it can't be rendered from this study is that there is no current evidence of obstruction. 2. 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 3. Ectatic ascending thoracic aorta. 4. Diverticulosis coli. 5. Thickened urinary bladder. Correlate. 6. Moderate degenerative changes of the thoracolumbar spine. Electronically signed by Robert Atilano Evander Lionel Problems           Last Modified POA    * (Principal) Swallowed foreign body 06/29/2024 Yes    Short-term memory loss 06/28/2024 Yes    Rheumatoid arthritis involving multiple sites with positive rheumatoid factor (HCC) 06/28/2024 Yes    Overview Addendum 10/15/2021  9:44 AM by Coretha Recardo Hasten, APRN - CNP   Last Assessment & Plan:   Formatting of this note might be different from the original.  Chronic/Stable. Denies any recent RA flares. Patient is taking MTX and   folic acid . Denies any adverse side effects. Continue with current   treatment plan. Reviewed previous labs. Ordered labs today. Will continue   to monitor over time.  Instructed patient to contact our office with any new or worsening   symptoms.      Formatting of this note might be different from the original.  Last Assessment & Plan:   Formatting of this note might be different from the original.  Chronic/Stable. Denies any recent RA flares. Patient is taking MTX and folic acid . Denies any adverse side effects. Continue with current treatment plan. Reviewed previous labs. Ordered labs today. Will continue to monitor over  time.    Instructed patient to contact our office with any new or worsening symptoms.  Formatting of this note might be different from the original.  Dx earlier 2006   (+) RF/CCP   MTX 25 mg/ml 0.8 ml     Last Assessment & Plan:   Formatting of this note might be different from the original.  Laboratory data CBC, AST, ALT and creatinine reviewed and reordered in 1 month for monitoring methotrexate   Disease is controlled.   Encourage patient to stay active.         Hyperlipidemia 06/28/2024 Yes    HTN (hypertension) 06/28/2024 Yes    Large cell carcinoma of lung, right (HCC) 06/28/2024 Yes    Hypothyroidism 06/28/2024 Yes       Plan   1.  Swallowing foreign object-wooden toothpick. Plan for EGD today for removal per Dr. Oza to reduce risk of perforation  - Keep patient NPO, plan discussed with patient and family at bedside    Electronically signed by SABRINA SHAY, MD on 06/29/24 at 7:41 AM EST      "

## 2024-06-29 NOTE — Progress Notes (Signed)
 Pt  bed in lowest position, wheels locked, and call light within reach. Hourly rounds completed. VSS. IV is patent and dressing is clean, dry, and intact.

## 2024-06-29 NOTE — Anesthesia Pre Procedure (Addendum)
 "Department of Anesthesiology  Preprocedure Note       Name:  Audrey Snow   Age:  83 y.o.  DOB:  Jun 07, 1941                                          MRN:  [id number]         Date:  06/29/2024      Surgeon: Clotilde):  Oza, Veeral, MD    Procedure: Procedure(s):  ESOPHAGOGASTRODUODENOSCOPY    Medications prior to admission:   Prior to Admission medications   Medication Sig Start Date End Date Taking? Authorizing Provider   losartan  (COZAAR ) 100 MG tablet Take 1 tablet by mouth daily TAKE 1 TABLET BY MOUTH DAILY 06/15/24  Yes Paduano, Julia S, APRN - CNP   atorvastatin  (LIPITOR ) 20 MG tablet Take 1 tablet by mouth daily 02/09/24 03/06/25 Yes Blackstone, Connie J, MD   metoprolol  succinate (TOPROL  XL) 50 MG extended release tablet Take 1 tablet by mouth daily 02/09/24 02/08/25 Yes Blackstone, Connie J, MD   levothyroxine  (SYNTHROID ) 125 MCG tablet TAKE 1 TABLET BY MOUTH DAILY 02/08/24  Yes Blackstone, Connie J, MD   albuterol  sulfate HFA (PROVENTIL ;VENTOLIN ;PROAIR ) 108 (90 Base) MCG/ACT inhaler Inhale 1 puff into the lungs every 4 hours as needed for Shortness of Breath or Wheezing 07/16/23  Yes Julieann Dorthea HERO, APRN - CNP   folic acid  (FOLVITE ) 1 MG tablet Take 1 tablet by mouth daily 01/21/23  Yes [provider]   Cholecalciferol 100 MCG (4000 UT) TABS Take 1 tablet by mouth daily 07/18/21  Yes [provider]       Current medications:    Current Facility-Administered Medications   Medication Dose Route Frequency Provider Last Rate Last Admin    sodium chloride  flush 0.9 % injection 5-40 mL  5-40 mL IntraVENous 2 times per day Celestia Iha, DO   10 mL at 06/29/24 1005    sodium chloride  flush 0.9 % injection 5-40 mL  5-40 mL IntraVENous PRN Celestia Iha, DO        0.9 % sodium chloride  infusion   IntraVENous PRN Celestia Iha, DO        potassium chloride  (KLOR-CON  M) extended release tablet 20 mEq  20 mEq Oral PRN Celestia Iha, DO        Or    potassium bicarb-citric acid  (EFFER-K)  effervescent tablet 40 mEq  40 mEq Oral PRN Celestia Iha, DO        Or    potassium chloride  10 mEq/100 mL IVPB (Peripheral Line)  10 mEq IntraVENous PRN Celestia Iha, DO        magnesium  sulfate 1000 mg in dextrose 5% 100 mL IVPB  1,000 mg IntraVENous PRN Celestia Iha, DO        melatonin tablet 3 mg  3 mg Oral Nightly PRN Celestia Iha, DO        polyethylene glycol (GLYCOLAX ) packet 17 g  17 g Oral Daily PRN Celestia Iha, DO        bisacodyl  (DULCOLAX) suppository 10 mg  10 mg Rectal Daily PRN Celestia Iha, DO        famotidine  (PEPCID ) tablet 10 mg  10 mg Oral Daily PRN Celestia Iha, DO        aluminum & magnesium  hydroxide-simethicone (MAALOX PLUS) 200-200-20 MG/5ML suspension 30 mL  30 mL Oral Q6H PRN Celestia Iha, DO  acetaminophen  (TYLENOL ) tablet 650 mg  650 mg Oral Q6H PRN Celestia Iha, DO        Or    acetaminophen  (TYLENOL ) suppository 650 mg  650 mg Rectal Q6H PRN Celestia Iha, DO        albuterol  (PROVENTIL ) (2.5 MG/3ML) 0.083% nebulizer solution 2.5 mg  2.5 mg Nebulization Q6H PRN Celestia Iha, DO        atorvastatin  (LIPITOR ) tablet 20 mg  20 mg Oral Nightly Celestia Iha, DO   20 mg at 06/28/24 2159    folic acid  (FOLVITE ) tablet 1 mg  1 mg Oral Daily Celestia Iha, DO        levothyroxine  (SYNTHROID ) tablet 125 mcg  125 mcg Oral Daily Celestia Iha, DO        losartan  (COZAAR ) tablet 100 mg  100 mg Oral Daily Celestia Iha, DO        metoprolol  succinate (TOPROL  XL) extended release tablet 50 mg  50 mg Oral Daily Celestia Iha, DO   50 mg at 06/29/24 1001       Allergies:    Allergies   Allergen Reactions    Yeast (Saccharomyces Cerevisiae) Nausea And Vomiting     Pt. 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    Dry skin dermatitis L85.3    Hyperlipidemia E78.5    Chronic right shoulder pain M25.511, G89.29    Pre-ulcerative corn or callous L84    Mass of lower lobe of right lung R91.8    Vitamin D deficiency E55.9    Environmental allergies Z91.09    HTN (hypertension) I10    Bilateral leg cramps R25.2    Gastroesophageal reflux disease without esophagitis K21.9    Large cell carcinoma of lung, 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    Adenocarcinoma of right lung, 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    Bronchogenic cancer of right lung (HCC) C34.91    Chronic pain of left knee M25.562, G89.29    Elevated blood sugar R73.9    Chronic obstructive pulmonary disease (HCC) J44.9    Hypothyroidism E03.9    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    Chronic obstructive pulmonary disease (HCC)     uses inhalers    Chronic pain of left knee 06/25/2018    Coronary atherosclerosis  noted on chest CT    COVID-19 vaccine series completed 08/15/2019    Pfizer; 07/29/2019 and booster given 03/12/2020    GERD (gastroesophageal reflux disease)     no meds at this time    H/O cardiovascular stress test     Bruce protocol, low risk  study, EF of 67% and normal LV function    H/O mammogram 07/28/2016    mammogram and Ultrasound of right breaset- benign finding- [facility]     Hearing loss     only hears out of left ear    Hearing loss of both ears     no hearing aids     HTN (hypertension), benign 01/30/2014    controlled w/med    Hypothyroid     on med for control    Menopause     Osteoarthritis     Osteoporosis     prolia     Poor historian     patient had trouble remembering names of meds and what they were for and had to repeat instructions for surgery several times     Rheumatoid arthritis(714.0) 01/30/2014    followed by Dr. Leonia. 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 Mateo in past    COLONOSCOPY N/A 01/27/2018    COLONOSCOPY/BMI 26 performed by Bertrum Toribio HERO, MD at [facility] ENDOSCOPY    COLONOSCOPY FLX DX W/COLLJ SPEC WHEN PFRMD  01/27/2018         CT NEEDLE BIOPSY LUNG PERCUTANEOUS W IMAGING GUIDANCE  04/16/2020    CT NEEDLE BIOPSY LUNG PERCUTANEOUS 04/16/2020 [facility] CT SCAN    HEMORRHOID SURGERY      OTHER SURGICAL HISTORY  09/2012    sinus surgery-Dr. Kennedy    OTHER SURGICAL HISTORY  01/27/2013    Thymoma-Dr. Osa    TONSILLECTOMY  83 yrs old    UPPER GASTROINTESTINAL ENDOSCOPY  2015    Gastric AVM txed with cautery-Dr. Delmus       Social History:    Social History     Tobacco Use    Smoking status: Former     Current packs/day: 0.00     Average packs/day: 1 pack/day for 24.0 years (24.0 ttl pk-yrs)     Types: Cigarettes     Start date: 07/08/1959     Quit date: 07/08/1983     Years since quitting: 41.0    Smokeless tobacco: Never    Tobacco comments:     Quit smoking: not  regular when first started- 1pk/week   Substance Use Topics    Alcohol use: Never                                Counseling given: Not Answered  Tobacco comments: Quit smoking: not regular when first started- 1pk/week      Vital Signs (Current):   Vitals:    06/29/24 0810 06/29/24 1001 06/29/24 1307 06/29/24 1310   BP: 118/65 130/74 130/74 ROLLEN)  157/59   Pulse: (!) 48 60 60 69   Resp: 16  16 16    Temp: 97.5 F (36.4 C)  97.5 F (36.4 C) 97.7 F (36.5 C)   TempSrc: Oral  Oral Temporal   SpO2:   93% 93%   Weight:    63.5 kg (140 lb)   Height:    1.702 m (5' 7)                                              BP Readings from Last 3 Encounters:   06/29/24 (!) 157/59   06/28/24 (!) 151/70   12/23/23 128/70       NPO Status: Time of last liquid consumption: 0800                        Time of last solid consumption: 2230                        Date of last liquid consumption: 06/29/24                        Date of last solid food consumption: 06/28/24    BMI:   Wt Readings from Last 3 Encounters:   06/29/24 63.5 kg (140 lb)   06/28/24 63.5 kg (140 lb)   12/23/23 62.2 kg (137 lb 3.2 oz)     Body mass index is 21.93 kg/m.    CBC:   Lab Results   Component Value Date/Time    WBC 6.3 06/29/2024 03:45 AM    RBC 3.99 06/29/2024 03:45 AM    HGB 12.8 06/29/2024 03:45 AM    HCT 39.1 06/29/2024 03:45 AM    MCV 98.0 06/29/2024 03:45 AM    RDW 13.9 06/29/2024 03:45 AM    PLT 193 06/29/2024 03:45 AM       CMP:   Lab Results   Component Value Date/Time    NA 138 06/29/2024 03:45 AM    K 4.3 06/29/2024 03:45 AM    CL 102 06/29/2024 03:45 AM    CO2 25 06/29/2024 03:45 AM    BUN 15 06/29/2024 03:45 AM    CREATININE 1.16 06/29/2024 03:45 AM    GFRAA 46 02/11/2021 01:26 PM    AGRATIO 1.1 11/09/2020 10:04 AM    LABGLOM 46 06/29/2024 03:45 AM    LABGLOM 32 09/09/2022 10:00 AM    GLUCOSE 88 06/29/2024 03:45 AM    CALCIUM  8.7 06/29/2024 03:45 AM    BILITOT 1.2 12/23/2023 02:59 PM    ALKPHOS 45 12/23/2023 02:59 PM    ALKPHOS 45 11/09/2020  10:04 AM    AST 26 12/23/2023 02:59 PM    ALT 15 12/23/2023 02:59 PM       POC Tests: No results for input(s): POCGLU, POCNA, POCK, POCCL, POCBUN, POCHEMO, POCHCT in the last 72 hours.    Coags:   Lab Results   Component Value Date/Time    PROTIME 13.7 05/02/2020 12:11 PM    INR 1.0 05/02/2020 12:11 PM    APTT 28.9 04/05/2020 11:46 AM       HCG (If Applicable): No results found for: PREGTESTUR, PREGSERUM, HCG, HCGQUANT     ABGs:   Lab Results   Component Value Date/Time    PHART 7.37 05/07/2020 02:19 PM  PO2ART 61 05/07/2020 02:19 PM    PCO2ART 39.9 05/07/2020 02:19 PM    HCO3ART 23.1 05/07/2020 02:19 PM        Type & Screen (If Applicable):  Lab Results   Component Value Date    ABORH AB POSITIVE 05/07/2020    LABANTI NEG 05/07/2020       Drug/Infectious Status (If Applicable):  No results found for: HIV, HEPCAB    COVID-19 Screening (If Applicable):   Lab Results   Component Value Date/Time    COVID19 Not detected 06/21/2020 07:52 AM    COVID19 Not Detected 05/03/2020 11:27 AM    COVID19 Performed 05/03/2020 11:27 AM           Anesthesia Evaluation          Airway:  Mallampati: II  TM distance: >3 FB   Neck ROM: full    Mouth opening: > = 3 FB   Dental:  normal exam   (+) poor dentition  Comment: Numerous missing teeth    Pulmonary:     breath sounds clear to auscultation  (+)    COPD (stable, well controlled): moderate                            Rhonchi: distant.       Cardiovascular:     Exercise tolerance: poor (<4 METS)  Ambulates unassisted    (+)     hypertension:          dysrhythmias: PVC                                  Rhythm: irregular  Rate: normal             ROS comment: Echo 2020:   Normal chamber sizes.    The left ventricular systolic function is normal (55-65%).    Grade I (mild) left ventricular diastolic dysfunction present,   consistent with impaired relaxation.    The right ventricular systolic function is normal.    No significant valvular abnormalities.      No ischemia on stress test 2021    PE comment: distant   Neuro/Psych:    (+)               neurocognitive disorder or dementia             GI/Hepatic/Renal:    (+)   GERD: well controlled      renal disease: CRI          Endo/Other:     (+)   thyroid problem: hypothyroidism  :  arthritis: rheumatoid    malignancy/cancer (lung)                 Abdominal:              Vascular:         Other Findings:              Anesthesia Plan      general     ASA 3       Induction: intravenous and rapid sequence.      Anesthetic plan and risks discussed with patient and child/children (granddaughter).                        Soniya Ashraf R Jizelle Conkey, MD   06/29/2024            "

## 2024-06-29 NOTE — Anesthesia Postprocedure Evaluation (Signed)
"  Department of Anesthesiology  Postprocedure Note    Patient: Audrey Snow  MRN: [id number]  Birthdate: [birth year]  Date of evaluation: 06/29/2024    Procedure Summary       Date: 06/29/24 Room / Location: [facility] / [facility]COPY    Anesthesia Start: 1348 Anesthesia Stop: 1424    Procedure: ESOPHAGOGASTRODUODENOSCOPY (Upper GI Region) Diagnosis:       Foreign body in esophagus      (Foreign body in esophagus [T18.108A])    Surgeons: Leora Ham, MD Responsible Provider: Cristobal Dorn SAUNDERS, MD    Anesthesia Type: general ASA Status: 3            Anesthesia Type: No value filed.    Aldrete Phase I: Aldrete Score: 10    Aldrete Phase II:      Anesthesia Post Evaluation    Patient location during evaluation: PACU  Patient participation: complete - patient participated  Level of consciousness: awake and alert  Airway patency: patent  Nausea & Vomiting: no nausea and no vomiting  Cardiovascular status: hemodynamically stable  Respiratory status: acceptable, nonlabored ventilation and spontaneous ventilation  Hydration status: euvolemic  Comments: BP 131/70   Pulse 61   Temp 97.2 F (36.2 C)   Resp 16   Ht 1.702 m (5' 7)   Wt 63.5 kg (140 lb)   SpO2 97%   BMI 21.93 kg/m     Multimodal analgesia pain management approach  Pain management: adequate and satisfactory to patient      No notable events documented.  "

## 2024-06-29 NOTE — Op Note (Addendum)
"  Procedure: EGD    Indication: Foreign body    Date of Procedure: 06/29/2024    Patient profile: Refer to patient note in chart for documentation of history and physical    Providers: Christoph Copelan, MD    Referring MD: No ref. provider found    PCP: Solon Jori PARAS, MD    Medicines: General Anesthesia    Complication: No immediate complications.     Estimated blood loss: Minimal    Procedure: After the risks including, but not limited to medication reaction, infection, bleeding, perforation, missed lesions, benefits and alternatives of the procedure were discussed with the patient and all questions were answered, informed consent was obtained. The gastroscope was passed under direct vision throughout the procedure, the patient's blood pressure pulse and oxygen saturation were monitored continuously. The scope was introduced through the mouth and advanced to the 2nd portion of the duodenum. The endoscopy was performed without difficulty. The patient tolerated the procedure well.       Findings:   --The adult gastroscope was introduced from the mouth and advanced through the esophagus to the GE junction.   --The esophagus was normal  --The scope was advanced to the stomach which was normal in forward and retroflexed views. A hiatal hernia was noted  --Duodenum was normal to 2nd portion.    The scope was pulled back, and the procedure was subsequently ended.    Impression:  --Normal exam. No obvious trauma noted to the mucosa in the stomach/duodenum. Unclear if patient truly swallowed a toothpick.     Recommendations:  -- Can consider a PET/CT especially if patient has abdominal pain, (currently asymptomatic) as sometimes the toothpick can lodge into the small /large bowel (Assuming she truly swallowed one), and can cause a local inflammatory reaction  --no additional inpatient GI recommendations at this time    Dorris Vangorder M. Dash Cardarelli, MD  Gastroenterology and Interventional Endoscopy      "

## 2024-06-29 NOTE — Periop Note (Signed)
"  TRANSFER - IN REPORT:    Verbal report received from Traci, RN on Audrey Snow  being received from 6th floor, room 605 for routine progression of patient care      Report consisted of patient's Situation, Background, Assessment and   Recommendations(SBAR).     Information from the following report(s) Nurse Handoff Report was reviewed with the receiving nurse.    Opportunity for questions and clarification was provided.      Assessment to be completed upon patient's arrival to unit and care to be assumed.    "

## 2024-06-29 NOTE — Discharge Instructions (Signed)
"  If any severe abdominal pain return to ER for CT scan  "

## 2024-07-01 NOTE — Care Coordination (Signed)
"  Care Transitions Note    Initial Call - Call within 2 business days of discharge: Yes, spoke with patient or caregiver.    Patient Current Location:  Home: [address]  [place] [facility] [address]    Care Transition Nurse contacted the family, daughter, Audrey Snow, (PHI form verified; on file) by telephone to perform post hospital discharge assessment, verified name and DOB as identifiers.  Provided introduction to self, and explanation of the Care Transition Nurse role.    Patient: Audrey Snow    Patient DOB: 1941/04/11   MRN: [id number]    Reason for Admission: Swallowed foreign body, initial encounter   Discharge Date: 06/29/24  RURS: Readmission Risk Score: 9.9      Last Discharge Facility       Date Complaint Diagnosis Description Type Department Provider    06/28/24   Admission (Discharged) [id number] Garnette Borrow, MD    06/28/24 Swallowed Foreign Body Swallowed foreign body, initial encounter ED (TRANSFER) [facility] Gerome Beverley HERO, MD            Was this an external facility discharge? No    Additional needs identified to be addressed with provider   No needs identified             Method of communication with provider: none.    Patients top risk factors for readmission: functional cognitive ability, medical condition-history in care summary note, and memory loss    Interventions to address risk factors:   Review of patient management of conditions/medications: verbalized understanding, no changes in medication    Care Summary Note: Rheumatoid arthritis involving multiple sites with positive rheumatoid factor, Postmenopausal osteoporosis, Vitamin D deficiency, Elevated C-reactive protein   Daughter is keeping an eye on patient ie: watching for changes; no pain, no new concerns/symptoms    Care Transition Nurse reviewed discharge instructions, medical action plan, and red flags with family. The family was given an opportunity to ask questions; all questions answered at this time.. The family verbalized  understanding.   Were discharge instructions available to patient? Yes.   Reviewed appropriate site of care based on symptoms and resources available to patient including: PCP  Specialist  Urgent care clinics  When to call 911. The family agrees to contact the primary care provider and/or specialist office for questions related to their healthcare.      Advance Care Planning:   Does patient have an Advance Directive: reviewed and current.    Medication Review:  Medication review was performed with family,1111F entered: yes.     Assessments:  Care Transitions 24 Hour Call    Care Transitions Interventions          Follow Up Appointment:   Patient does not have a follow up appointment scheduled at time of call. Information sent to PCP office   Future Appointments         Provider Specialty Dept Phone    09/05/2024 9:00 AM [facility] [phone]    09/05/2024 10:00 AM Mertha Garnette DEL, MD Oncology [phone]    10/06/2024 9:00 AM Betancur Junita Marven HERO, MD Internal Medicine [phone]            Care Transition Nurse provided contact information.  Plan for follow-up call in 6-10 days based on severity of symptoms and risk factors.  Plan for next call: review incident and discuss concerns/questions.     Farrel FORBES Fischer, RN     "

## 2024-07-01 NOTE — Telephone Encounter (Signed)
"  Care Transitions Initial Follow Up Call    Patient: Audrey Snow Patient DOB: Nov 15, 1940   MRN: [id number]  Reason for Admission: Swallowed foreign body   Discharge Date: 06/29/24      Discharge department/facility: [facility]     Outreach made within 2 business days of discharge: Yes, spoke with patient or caregiver     [facility] Interactive Patient Contact:  Was patient able to fill all prescriptions: No, no new medication was prescribed   Was patient instructed to bring all medications to the follow-up visit: Yes  Is patient taking all medications as directed in the discharge summary? Yes  Does patient understand their discharge instructions: Yes  Does patient have questions or concerns that need addressed prior to 0-14 day follow up office visit: No, no appointment needed at this time per daughter    Additional needs identified to be addressed with provider  No needs identified           .    Scheduled appointment with PCP within 0-14 days    Follow Up  Future Appointments   Date Time Provider Department Center   09/05/2024  9:00 AM [facility]   09/05/2024 10:00 AM Mertha Garnette DEL, MD [facility]   10/06/2024  9:00 AM Betancur Junita Marven HERO, MD [facility] DEP       Jolan Cosier, MA   "

## 2024-07-06 NOTE — Care Coordination (Signed)
"  Care Transitions Note    Follow Up Call     Patient Current Location:  Home: [address]  [place] [facility] [address]    Care Transition Nurse contacted the family, Bernadette, daughter by telephone. Verified name and DOB as identifiers.    Additional needs identified to be addressed with provider   No needs identified                 Method of communication with provider: none.    Care Summary Note: spoke with patient's daughter, Bernadette, states that patient has not had any complaints of abdominal pain, no other complaints.   No questions at this time.    Plan of care updates since last contact:  Review of patient management of conditions/medications: verbalized understanding.       Advance Care Planning:   Does patient have an Advance Directive: reviewed and current.    Medication Review:  Full medication review completed during previous call.    Assessments:  Care Transitions Subsequent and Final Call    Subsequent and Final Calls  Do you have any ongoing symptoms?: No  Have your medications changed?: No  Do you have any questions related to your medications?: No  Do you have any needs or concerns that I can assist you with?: No  Identified Barriers: Other  Care Transitions Interventions  Other Interventions:              Follow Up Appointment:   N/A  Future Appointments         Provider Specialty Dept Phone    09/05/2024 9:00 AM [facility] [phone]    09/05/2024 10:00 AM Mertha Garnette DEL, MD Oncology [phone]    10/06/2024 9:00 AM Betancur Junita Marven HERO, MD Internal Medicine [phone]            Care Transition Nurse provided contact information.  Plan for follow-up call in 6-10 days based on severity of symptoms and risk factors.  Plan for next call: continue to discuss swallowing of foreign object and any other issues.     Farrel FORBES Fischer, RN     "

## 2024-07-20 NOTE — Telephone Encounter (Signed)
 Called patient to reschedule appointment. 1st attempt. Left voice mail.

## 2024-07-21 NOTE — Care Coordination (Signed)
"  Care Transitions Note    Follow Up Call     Attempted to reach patient, family, [name] for transitions of care follow up.  Unable to reach patient, family, [name].      Outreach Attempts:   HIPAA compliant voicemail left for patient, family, [name].     Care Summary Note: Unable to reach.     Follow Up Appointment:   Future Appointments         Provider Specialty Dept Phone    09/12/2024 9:10 AM [facility] Lab [phone]    09/12/2024 10:15 AM Mertha Garnette DEL, MD Oncology [phone]    10/06/2024 9:00 AM Betancur Junita Marven HERO, MD Internal Medicine [phone]            Plan for follow-up call in 6-10 days based on severity of symptoms and risk factors. Plan for next call: self management-diet, hydration, exercise, follow up appointments.     Stephane LITTIE Molt, LPN        "

## 2024-08-01 NOTE — Care Coordination (Signed)
 Care Transitions Note    Final Call     Attempted to reach patient, family, daughter Bernadette for transitions of care follow up.  Unable to reach patient, family, daughter Bernadette.      Outreach Attempts:   Multiple attempts to contact patient, family, daughter Bernadette at phone numbers on file.   HIPAA compliant voicemail left for patient, family, daughter Bernadette.     Patient closed (unable to reach patient) from the Care Transitions program on 08/01/2024.  Patient/family Unable to reach.  Last 2 TOC outreach attempts unsuccessful.      Handoff:   Patient was not referred to the [facility] team due to unable to contact patient.      Care Summary Note: Unable to reach.  Last 2 TOC outreach attempts were unsuccessful.     Assessments:  Care Transitions Subsequent and Final Call    Subsequent and Final Calls  Care Transitions Interventions  Other Interventions:              Upcoming Appointments:    Future Appointments         Provider Specialty Dept Phone    09/12/2024 9:10 AM [facility] [phone]    09/12/2024 10:15 AM Mertha Garnette DEL, MD Oncology [phone]    10/06/2024 9:00 AM [facility] Junita Marven HERO, MD Internal Medicine [phone]            Stephane LITTIE Molt, LPN
